# Patient Record
Sex: Male | Born: 1935 | Race: Black or African American | Hispanic: No | Marital: Married | Smoking: Former smoker
Health system: Southern US, Community
[De-identification: ages and names within clinical notes are randomized; demographics above are authoritative.]

## PROBLEM LIST (undated history)

## (undated) DIAGNOSIS — H9190 Unspecified hearing loss, unspecified ear: Secondary | ICD-10-CM

## (undated) DIAGNOSIS — F0390 Unspecified dementia without behavioral disturbance: Secondary | ICD-10-CM

## (undated) DIAGNOSIS — E876 Hypokalemia: Secondary | ICD-10-CM

## (undated) DIAGNOSIS — J45909 Unspecified asthma, uncomplicated: Secondary | ICD-10-CM

## (undated) DIAGNOSIS — R351 Nocturia: Secondary | ICD-10-CM

## (undated) DIAGNOSIS — M5410 Radiculopathy, site unspecified: Secondary | ICD-10-CM

## (undated) DIAGNOSIS — M25569 Pain in unspecified knee: Secondary | ICD-10-CM

## (undated) DIAGNOSIS — G8929 Other chronic pain: Secondary | ICD-10-CM

## (undated) DIAGNOSIS — M109 Gout, unspecified: Secondary | ICD-10-CM

## (undated) DIAGNOSIS — I10 Essential (primary) hypertension: Secondary | ICD-10-CM

## (undated) DIAGNOSIS — E7800 Pure hypercholesterolemia, unspecified: Secondary | ICD-10-CM

## (undated) DIAGNOSIS — N186 End stage renal disease: Secondary | ICD-10-CM

## (undated) DIAGNOSIS — N189 Chronic kidney disease, unspecified: Secondary | ICD-10-CM

## (undated) HISTORY — PX: ROTATOR CUFF REPAIR: SHX139

## (undated) HISTORY — DX: End stage renal disease: N18.6

## (undated) HISTORY — DX: Pure hypercholesterolemia, unspecified: E78.00

## (undated) HISTORY — PX: APPENDECTOMY: SHX54

## (undated) HISTORY — PX: OTHER SURGICAL HISTORY: SHX169

---

## 2001-02-03 ENCOUNTER — Encounter: Payer: Self-pay | Admitting: Family Medicine

## 2001-02-03 ENCOUNTER — Ambulatory Visit (HOSPITAL_COMMUNITY): Admission: RE | Admit: 2001-02-03 | Discharge: 2001-02-03 | Payer: Self-pay | Admitting: Family Medicine

## 2006-02-07 ENCOUNTER — Encounter: Admission: RE | Admit: 2006-02-07 | Discharge: 2006-02-07 | Payer: Self-pay | Admitting: Internal Medicine

## 2007-02-19 ENCOUNTER — Emergency Department (HOSPITAL_COMMUNITY): Admission: EM | Admit: 2007-02-19 | Discharge: 2007-02-19 | Payer: Self-pay | Admitting: Emergency Medicine

## 2007-12-15 ENCOUNTER — Ambulatory Visit: Admission: RE | Admit: 2007-12-15 | Discharge: 2007-12-15 | Payer: Self-pay | Admitting: Neurology

## 2008-02-08 ENCOUNTER — Emergency Department (HOSPITAL_COMMUNITY): Admission: EM | Admit: 2008-02-08 | Discharge: 2008-02-08 | Payer: Self-pay | Admitting: Emergency Medicine

## 2010-01-30 ENCOUNTER — Inpatient Hospital Stay (HOSPITAL_COMMUNITY)
Admission: EM | Admit: 2010-01-30 | Discharge: 2010-02-17 | DRG: 339 | Disposition: A | Discharge status: Home Health | Payer: Medicare Other | Attending: Surgery | Admitting: Surgery

## 2010-01-30 DIAGNOSIS — K929 Disease of digestive system, unspecified: Secondary | ICD-10-CM | POA: Diagnosis not present

## 2010-01-30 DIAGNOSIS — M109 Gout, unspecified: Secondary | ICD-10-CM | POA: Diagnosis present

## 2010-01-30 DIAGNOSIS — K35209 Acute appendicitis with generalized peritonitis, without abscess, unspecified as to perforation: Principal | ICD-10-CM | POA: Diagnosis present

## 2010-01-30 DIAGNOSIS — N400 Enlarged prostate without lower urinary tract symptoms: Secondary | ICD-10-CM | POA: Diagnosis present

## 2010-01-30 DIAGNOSIS — K560 Paralytic ileus: Secondary | ICD-10-CM | POA: Diagnosis not present

## 2010-01-30 DIAGNOSIS — Z880 Allergy status to penicillin: Secondary | ICD-10-CM

## 2010-01-30 DIAGNOSIS — Y9210 Unspecified residential institution as the place of occurrence of the external cause: Secondary | ICD-10-CM | POA: Diagnosis not present

## 2010-01-30 DIAGNOSIS — J449 Chronic obstructive pulmonary disease, unspecified: Secondary | ICD-10-CM | POA: Diagnosis present

## 2010-01-30 DIAGNOSIS — E119 Type 2 diabetes mellitus without complications: Secondary | ICD-10-CM | POA: Diagnosis present

## 2010-01-30 DIAGNOSIS — Z6832 Body mass index (BMI) 32.0-32.9, adult: Secondary | ICD-10-CM

## 2010-01-30 DIAGNOSIS — Y838 Other surgical procedures as the cause of abnormal reaction of the patient, or of later complication, without mention of misadventure at the time of the procedure: Secondary | ICD-10-CM | POA: Diagnosis not present

## 2010-01-30 DIAGNOSIS — J4489 Other specified chronic obstructive pulmonary disease: Secondary | ICD-10-CM | POA: Diagnosis present

## 2010-01-30 DIAGNOSIS — E669 Obesity, unspecified: Secondary | ICD-10-CM | POA: Diagnosis present

## 2010-01-30 DIAGNOSIS — I10 Essential (primary) hypertension: Secondary | ICD-10-CM | POA: Diagnosis present

## 2010-01-30 DIAGNOSIS — G4733 Obstructive sleep apnea (adult) (pediatric): Secondary | ICD-10-CM | POA: Diagnosis present

## 2010-01-30 DIAGNOSIS — Z7982 Long term (current) use of aspirin: Secondary | ICD-10-CM

## 2010-01-30 DIAGNOSIS — K219 Gastro-esophageal reflux disease without esophagitis: Secondary | ICD-10-CM | POA: Diagnosis present

## 2010-01-30 DIAGNOSIS — K352 Acute appendicitis with generalized peritonitis, without abscess: Principal | ICD-10-CM | POA: Diagnosis present

## 2010-01-30 DIAGNOSIS — E46 Unspecified protein-calorie malnutrition: Secondary | ICD-10-CM | POA: Diagnosis present

## 2010-01-30 DIAGNOSIS — M129 Arthropathy, unspecified: Secondary | ICD-10-CM | POA: Diagnosis present

## 2010-01-30 LAB — GLUCOSE, CAPILLARY
Glucose-Capillary: 132 mg/dL — ABNORMAL HIGH (ref 70–99)
Glucose-Capillary: 128 mg/dL — ABNORMAL HIGH (ref 70–99)

## 2010-01-31 LAB — DIFFERENTIAL
Basophils Relative: 0 % (ref 0–1)
Eosinophils Absolute: 0.1 10*3/uL (ref 0.0–0.7)
Monocytes Relative: 11 % (ref 3–12)
Neutrophils Relative %: 77 % (ref 43–77)

## 2010-01-31 LAB — GLUCOSE, CAPILLARY
Glucose-Capillary: 131 mg/dL — ABNORMAL HIGH (ref 70–99)
Glucose-Capillary: 123 mg/dL — ABNORMAL HIGH (ref 70–99)

## 2010-01-31 LAB — BASIC METABOLIC PANEL
Calcium: 8.3 mg/dL — ABNORMAL LOW (ref 8.4–10.5)
Chloride: 104 mEq/L (ref 96–112)
Creatinine, Ser: 1.68 mg/dL — ABNORMAL HIGH (ref 0.4–1.5)
GFR calc Af Amer: 48 mL/min — ABNORMAL LOW (ref >60)

## 2010-01-31 LAB — CBC
MCH: 28.5 pg (ref 26.0–34.0)
MCHC: 34.7 g/dL (ref 30.0–36.0)
Platelets: 209 10*3/uL (ref 150–400)
RBC: 3.69 MIL/uL — ABNORMAL LOW (ref 4.22–5.81)

## 2010-02-01 LAB — COMPREHENSIVE METABOLIC PANEL
AST: 11 U/L (ref 0–37)
Albumin: 2.5 g/dL — ABNORMAL LOW (ref 3.5–5.2)
Alkaline Phosphatase: 59 U/L (ref 39–117)
Chloride: 107 mEq/L (ref 96–112)
Creatinine, Ser: 1.6 mg/dL — ABNORMAL HIGH (ref 0.4–1.5)
GFR calc Af Amer: 51 mL/min — ABNORMAL LOW (ref >60)
Potassium: 3.2 mEq/L — ABNORMAL LOW (ref 3.5–5.1)
Total Bilirubin: 0.6 mg/dL (ref 0.3–1.2)
Total Protein: 5.1 g/dL — ABNORMAL LOW (ref 6.0–8.3)

## 2010-02-01 LAB — CBC
Platelets: 191 10*3/uL (ref 150–400)
RBC: 3.55 MIL/uL — ABNORMAL LOW (ref 4.22–5.81)
WBC: 10.6 10*3/uL — ABNORMAL HIGH (ref 4.0–10.5)

## 2010-02-01 LAB — GLUCOSE, CAPILLARY
Glucose-Capillary: 167 mg/dL — ABNORMAL HIGH (ref 70–99)
Glucose-Capillary: 95 mg/dL (ref 70–99)
Glucose-Capillary: 123 mg/dL — ABNORMAL HIGH (ref 70–99)

## 2010-02-01 LAB — DIFFERENTIAL
Basophils Relative: 0 % (ref 0–1)
Eosinophils Absolute: 0.2 10*3/uL (ref 0.0–0.7)
Neutrophils Relative %: 74 % (ref 43–77)

## 2010-02-01 NOTE — Consult Note (Signed)
NAME:  MILES, NEUPERT NO.:  [id number]  MEDICAL RECORD NO.:  [id number]          PATIENT TYPE:  INP  LOCATION:  5149                         FACILITY:  [facility]  PHYSICIAN:  Marcello Moores A. Aryah Doering, M.D.DATE OF BIRTH:  [birth year]  DATE OF CONSULTATION:  01/30/2010 DATE OF DISCHARGE:                                CONSULTATION   PHYSICIAN REQUESTING CONSULTATION:  Haywood Pao, MD  REASON FOR CONSULTATION:  Perforated appendicitis.  HISTORY OF PRESENT ILLNESS:  The patient is a pleasant 77 year old male with 1-week history of lower abdominal pain and anorexia.  Pain was diffuse about a week ago and then 2 or 3 days later became more in his right lower quadrant.  He has had right lower quadrant pain now for about 3 days.  It only hurts when he rolls on.  The pain is located in the right lower quadrant and is about a 5-8/10, made worse with compression.  He has loss of appetite, but has been eating and trying to eat at home with no nausea or vomiting.  Bowels are moving with no blood in his stool.  He was seen by [name] today.  CT scan was obtained which showed perforated appendicitis without abscess.  I was asked to see him at the request of [name].  According to the family, he is relatively comfortable.  PAST MEDICAL HISTORY:  Diabetes mellitus type 2, obesity, hypertension, sleep apnea, benign prostatic hypertrophy, COPD, gastroesophageal reflux disease.  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  ALLERGIES:  PENICILLIN, ACE INHIBITOR.  FAMILY HISTORY:  Diabetes mellitus.  SOCIAL HISTORY:  He is retired, worked at [employer].  Denies tobacco or alcohol use.  He is married.  REVIEW OF SYSTEMS:  As per chart, otherwise negative x15.  PHYSICAL EXAMINATION:  GENERAL APPEARANCE:  Pleasant male, in no apparent distress. HEENT:  Wears glasses, no jaundice. NECK:  Supple, nontender.   Trachea midline.  No obvious mass. PULMONARY:  Lung sounds are clear.  Chest wall motion is normal. CARDIOVASCULAR:  Regular rate and rhythm without rub, murmur, or gallop. EXTREMITIES:  Warm, well perfused. ABDOMEN:  Protuberant, tender right lower quadrant.  No diffuse peritonitis, rebound, or guarding noted in right lower quadrant.  No mass or phlegmon.  No hernia. EXTREMITIES:  Range of motion and muscle tone appear relatively normal. NEUROLOGIC:  Glasgow coma scale is 15.  Motor and sensory function are intact.  DIAGNOSTIC STUDIES:  Review of his abdomen and pelvic CT scan in [facility] which shows acute appendicitis with perforation without abscess.  IMPRESSION: 1. Perforated appendicitis. 2. Diabetes mellitus type 2. 3. Sleep apnea. 4. Benign prostatic hypertrophy. 5. Chronic obstructive sleep apnea.  PLAN:  Given the fact it is perforated and appears to be localized, recommend IV antibiotics.  He is ordered an Avelox, which is appropriate.  He will need a CT scan in 2-3 days for followup and may have a clear liquid diet and may ambulate.  We will follow along.  If condition worsens, he may require exploratory laparotomy.  I have discussed this with the patient and  wife.     Joyice Faster. Taniqua Issa, [name]     TAC/MEDQ  D:  01/30/2010  T:  01/31/2010  Job:  [id number]  Electronically Signed by Erroll Luna [name] on 02/01/2010 12:34:59 AM

## 2010-02-02 LAB — COMPREHENSIVE METABOLIC PANEL
ALT: 15 U/L (ref 0–53)
AST: 11 U/L (ref 0–37)
Albumin: 2.5 g/dL — ABNORMAL LOW (ref 3.5–5.2)
Alkaline Phosphatase: 63 U/L (ref 39–117)
Chloride: 108 mEq/L (ref 96–112)
GFR calc Af Amer: 53 mL/min — ABNORMAL LOW (ref >60)
Potassium: 3.6 mEq/L (ref 3.5–5.1)
Sodium: 142 mEq/L (ref 135–145)
Total Bilirubin: 0.8 mg/dL (ref 0.3–1.2)
Total Protein: 5.2 g/dL — ABNORMAL LOW (ref 6.0–8.3)

## 2010-02-02 LAB — DIFFERENTIAL
Basophils Absolute: 0 10*3/uL (ref 0.0–0.1)
Basophils Relative: 0 % (ref 0–1)
Eosinophils Absolute: 0.3 10*3/uL (ref 0.0–0.7)
Lymphs Abs: 1.3 10*3/uL (ref 0.7–4.0)
Neutrophils Relative %: 70 % (ref 43–77)

## 2010-02-02 LAB — CBC
MCV: 82.9 fL (ref 78.0–100.0)
Platelets: 184 10*3/uL (ref 150–400)
RBC: 3.51 MIL/uL — ABNORMAL LOW (ref 4.22–5.81)
RDW: 15 % (ref 11.5–15.5)
WBC: 8.8 10*3/uL (ref 4.0–10.5)

## 2010-02-03 LAB — COMPREHENSIVE METABOLIC PANEL
ALT: 14 U/L (ref 0–53)
AST: 12 U/L (ref 0–37)
CO2: 23 mEq/L (ref 19–32)
Calcium: 8.6 mg/dL (ref 8.4–10.5)
Chloride: 110 mEq/L (ref 96–112)
Creatinine, Ser: 1.69 mg/dL — ABNORMAL HIGH (ref 0.4–1.5)
GFR calc Af Amer: 48 mL/min — ABNORMAL LOW (ref >60)
GFR calc non Af Amer: 40 mL/min — ABNORMAL LOW (ref >60)
Glucose, Bld: 103 mg/dL — ABNORMAL HIGH (ref 70–99)
Sodium: 142 mEq/L (ref 135–145)
Total Bilirubin: 0.7 mg/dL (ref 0.3–1.2)

## 2010-02-03 LAB — CBC
HCT: 29.1 % — ABNORMAL LOW (ref 39.0–52.0)
Hemoglobin: 9.8 g/dL — ABNORMAL LOW (ref 13.0–17.0)
MCH: 27.8 pg (ref 26.0–34.0)
MCHC: 33.7 g/dL (ref 30.0–36.0)
RBC: 3.52 MIL/uL — ABNORMAL LOW (ref 4.22–5.81)

## 2010-02-03 LAB — DIFFERENTIAL
Basophils Relative: 0 % (ref 0–1)
Lymphocytes Relative: 16 % (ref 12–46)
Monocytes Absolute: 1.2 10*3/uL — ABNORMAL HIGH (ref 0.1–1.0)
Monocytes Relative: 14 % — ABNORMAL HIGH (ref 3–12)
Neutro Abs: 6.2 10*3/uL (ref 1.7–7.7)
Neutrophils Relative %: 68 % (ref 43–77)

## 2010-02-03 LAB — GLUCOSE, CAPILLARY
Glucose-Capillary: 106 mg/dL — ABNORMAL HIGH (ref 70–99)
Glucose-Capillary: 94 mg/dL (ref 70–99)
Glucose-Capillary: 144 mg/dL — ABNORMAL HIGH (ref 70–99)
Glucose-Capillary: 133 mg/dL — ABNORMAL HIGH (ref 70–99)

## 2010-02-04 ENCOUNTER — Inpatient Hospital Stay (HOSPITAL_COMMUNITY): Payer: Medicare Other

## 2010-02-04 ENCOUNTER — Other Ambulatory Visit: Payer: Self-pay | Admitting: Surgery

## 2010-02-04 LAB — DIFFERENTIAL
Basophils Absolute: 0 10*3/uL (ref 0.0–0.1)
Basophils Relative: 0 % (ref 0–1)
Monocytes Absolute: 1.8 10*3/uL — ABNORMAL HIGH (ref 0.1–1.0)
Neutro Abs: 7.7 10*3/uL (ref 1.7–7.7)
Neutrophils Relative %: 69 % (ref 43–77)

## 2010-02-04 LAB — CBC
Hemoglobin: 9.1 g/dL — ABNORMAL LOW (ref 13.0–17.0)
MCHC: 32.7 g/dL (ref 30.0–36.0)
RBC: 3.39 MIL/uL — ABNORMAL LOW (ref 4.22–5.81)

## 2010-02-04 LAB — GLUCOSE, CAPILLARY
Glucose-Capillary: 137 mg/dL — ABNORMAL HIGH (ref 70–99)
Glucose-Capillary: 153 mg/dL — ABNORMAL HIGH (ref 70–99)
Glucose-Capillary: 189 mg/dL — ABNORMAL HIGH (ref 70–99)

## 2010-02-04 LAB — COMPREHENSIVE METABOLIC PANEL
ALT: 16 U/L (ref 0–53)
AST: 14 U/L (ref 0–37)
CO2: 20 mEq/L (ref 19–32)
Calcium: 8.4 mg/dL (ref 8.4–10.5)
GFR calc Af Amer: 46 mL/min — ABNORMAL LOW (ref >60)
GFR calc non Af Amer: 38 mL/min — ABNORMAL LOW (ref >60)
Sodium: 140 mEq/L (ref 135–145)

## 2010-02-04 NOTE — H&P (Signed)
NAME:  Miguel Tucker, ROSSMILLER NO.:  [id number]  MEDICAL RECORD NO.:  [id number]          PATIENT TYPE:  INP  LOCATION:  [id number]                         FACILITY:  [facility]  PHYSICIAN:  Haywood Pao, M.D.DATE OF BIRTH:  1930/09/08  DATE OF ADMISSION:  01/30/2010 DATE OF DISCHARGE:                             HISTORY & PHYSICAL   CHIEF COMPLAINT:  Abdominal pain.  HISTORY OF PRESENT ILLNESS:  The patient is a 77 year old African American male who is seen today as a walk-in at 10 a.m.  He complains of right lower quadrant pain for 3 days.  He was able eat some oatmeal this morning but nothing else, has had some nausea over the past 3 days, but has not taken any over-the-counter pain medicines.  He said he had two bowel movements, which were somewhat loose this morning, states that the pain has been quite severe in the past 24 hours, but he chose not to go to the emergency room for it.  His wife is currently in the office with him here.  PAST MEDICAL HISTORY: 1. Diabetes mellitus type 2 with microalbumin 2010. 2. Hypertension. 3. BPH. 4. COPD/asthma, mild. 5. History of shingles on his right leg. 6. GERD with H. pylori, which has been treated in 2008. 7. Obstructive sleep apnea.  PAST SURGICAL HISTORY:  Rotator cuff, right 1998.  Noncancerous testicle tumor removed in 1958.    SOCIAL HISTORY:   The patient is married, has three sons.  His wife is currently with him.  He is retired.  FAMILY HISTORY:  Father died at age 35 of dementia.  Mother died at age 3 status post MI with a question of cancer.  He has a brother who died of a stroke at 40, a sister who died at age 34 of a brain tumor.  He has three sons one with diabetes mellitus on insulin, one who is healthy, another who is obese.  Of note, one of his sons had a ruptured appendix several years ago.  MEDICATIONS: 1. Norvasc 5 mg daily. 2. Terazosin 5 mg at bedtime. 3. Klor-Con 20 mEq once daily. 4.  Avodart 0.5 mg once daily. 5. Metformin 500 mg twice daily. 6. Aspirin 81 mg once daily. 7. Hyzaar 100/25 mg once daily. 8. Ventolin HFA 2 puffs q.6 h. as needed. 9. Singulair 10 mg once daily. 10.Nasonex once per each nostril once daily.  ALLERGIES:  ACE INHIBITORS and PENICILLIN.  REVIEW OF SYSTEMS:  The patient has not had any fevers, does have some chills, indicates that he has had nausea without vomiting.  He had some loose bowel movements today and abdominal pain in his right lower quadrant, which has become more severe with time.  Denies any urinary symptoms.  Denies any chest pain, shortness of breath, or dyspnea on exertion, and no new wheezing or asthma-type flares.  ADVANCED DIRECTIVES:  The patient is a full code.  PHYSICAL EXAMINATION:  VITAL SIGNS:  Blood pressure 134/72, pulse 68, his weight is 189 pounds.  He is 5 feet 4 inches. GENERAL:  No acute distress. HEENT:  Normocephalic, atraumatic.  PERRLA.  EOMI.  ENT is within normal limits.  NECK:  Supple.  No lymphadenopathy, JVD, or bruit. HEART:  Regular rate and rhythm.  No murmur, rub, or gallop. LUNGS:  Clear to auscultation bilaterally. ABDOMEN:  The patient has severe tenderness in his right lower quadrant pain with rebound and guarding. MUSCULOSKELETAL:  No joint deformities noted. NEURO:  The patient is oriented to person, place, and time. SKIN:  No new rashes.  LABORATORY DATA:  BUN and creatinine of 20 and 1.6 respectively with his BUN and creatinine being slightly elevated for him.  He has a white count of 12.1 with a granulocyte count of 9.7 with 80% granulocytes. Hemoglobin 12.5, platelets 261.  LFTs are within normal limits.  CT of abdomen and pelvis done with contrast at [facility] reveals a ruptured appendix with inflammation around the cecum.  ASSESSMENT AND PLAN: 1. Ruptured appendix.  I have contacted Dr. Brantley Stage for surgical     management.  He will see the patient once he  arrives at the     hospital and directly admitting him to the hospital.  We will cover     him with Avelox as he has a PENICILLIN allergy, and this will be     adequate for below the diaphragm coverage.  We will make him n.p.o.     and continue IV fluids. 2. Sliding scale for insulin as he will be off metformin. 3. He is not a high-risk surgical candidate and does not require     clearance beyond what has been done in the office already.     Haywood Pao, [name]     RWT/MEDQ  D:  01/30/2010  T:  01/31/2010  Job:  [id number]  cc:   Thomas A. Cornett, [name]  Electronically Signed by Domenick Gong [name] on 02/04/2010 08:33:08 AM

## 2010-02-05 LAB — CBC
MCH: 27.4 pg (ref 26.0–34.0)
MCHC: 34.1 g/dL (ref 30.0–36.0)
MCV: 80.2 fL (ref 78.0–100.0)
Platelets: 186 10*3/uL (ref 150–400)
RDW: 15.3 % (ref 11.5–15.5)

## 2010-02-05 LAB — DIFFERENTIAL
Eosinophils Absolute: 0 10*3/uL (ref 0.0–0.7)
Eosinophils Relative: 0 % (ref 0–5)
Lymphs Abs: 1.2 10*3/uL (ref 0.7–4.0)
Monocytes Absolute: 1.7 10*3/uL — ABNORMAL HIGH (ref 0.1–1.0)
Monocytes Relative: 14 % — ABNORMAL HIGH (ref 3–12)

## 2010-02-05 LAB — COMPREHENSIVE METABOLIC PANEL
Albumin: 2.1 g/dL — ABNORMAL LOW (ref 3.5–5.2)
BUN: 12 mg/dL (ref 6–23)
Creatinine, Ser: 1.66 mg/dL — ABNORMAL HIGH (ref 0.4–1.5)
Glucose, Bld: 230 mg/dL — ABNORMAL HIGH (ref 70–99)
Total Bilirubin: 0.7 mg/dL (ref 0.3–1.2)
Total Protein: 5.3 g/dL — ABNORMAL LOW (ref 6.0–8.3)

## 2010-02-05 LAB — GLUCOSE, CAPILLARY
Glucose-Capillary: 206 mg/dL — ABNORMAL HIGH (ref 70–99)
Glucose-Capillary: 222 mg/dL — ABNORMAL HIGH (ref 70–99)

## 2010-02-06 LAB — COMPREHENSIVE METABOLIC PANEL
Albumin: 1.9 g/dL — ABNORMAL LOW (ref 3.5–5.2)
BUN: 15 mg/dL (ref 6–23)
Calcium: 8 mg/dL — ABNORMAL LOW (ref 8.4–10.5)
Creatinine, Ser: 1.83 mg/dL — ABNORMAL HIGH (ref 0.4–1.5)
Potassium: 4.4 mEq/L (ref 3.5–5.1)
Total Protein: 5 g/dL — ABNORMAL LOW (ref 6.0–8.3)

## 2010-02-06 LAB — CBC
Hemoglobin: 9.5 g/dL — ABNORMAL LOW (ref 13.0–17.0)
MCHC: 33.2 g/dL (ref 30.0–36.0)
Platelets: 182 10*3/uL (ref 150–400)
RDW: 15.7 % — ABNORMAL HIGH (ref 11.5–15.5)

## 2010-02-06 LAB — GLUCOSE, CAPILLARY
Glucose-Capillary: 167 mg/dL — ABNORMAL HIGH (ref 70–99)
Glucose-Capillary: 144 mg/dL — ABNORMAL HIGH (ref 70–99)
Glucose-Capillary: 159 mg/dL — ABNORMAL HIGH (ref 70–99)

## 2010-02-06 LAB — DIFFERENTIAL
Basophils Absolute: 0 10*3/uL (ref 0.0–0.1)
Basophils Relative: 0 % (ref 0–1)
Monocytes Absolute: 1.4 10*3/uL — ABNORMAL HIGH (ref 0.1–1.0)
Neutro Abs: 8.9 10*3/uL — ABNORMAL HIGH (ref 1.7–7.7)

## 2010-02-06 LAB — PHOSPHORUS: Phosphorus: 3.4 mg/dL (ref 2.3–4.6)

## 2010-02-07 ENCOUNTER — Inpatient Hospital Stay (HOSPITAL_COMMUNITY): Payer: Medicare Other

## 2010-02-07 ENCOUNTER — Encounter (HOSPITAL_COMMUNITY): Payer: Self-pay | Admitting: Internal Medicine

## 2010-02-07 LAB — COMPREHENSIVE METABOLIC PANEL
Albumin: 1.9 g/dL — ABNORMAL LOW (ref 3.5–5.2)
BUN: 18 mg/dL (ref 6–23)
Chloride: 110 mEq/L (ref 96–112)
Creatinine, Ser: 1.84 mg/dL — ABNORMAL HIGH (ref 0.4–1.5)
Glucose, Bld: 175 mg/dL — ABNORMAL HIGH (ref 70–99)
Total Bilirubin: 1 mg/dL (ref 0.3–1.2)

## 2010-02-07 LAB — GLUCOSE, CAPILLARY
Glucose-Capillary: 159 mg/dL — ABNORMAL HIGH (ref 70–99)
Glucose-Capillary: 149 mg/dL — ABNORMAL HIGH (ref 70–99)
Glucose-Capillary: 157 mg/dL — ABNORMAL HIGH (ref 70–99)

## 2010-02-07 LAB — CBC
HCT: 28.5 % — ABNORMAL LOW (ref 39.0–52.0)
MCH: 27.4 pg (ref 26.0–34.0)
MCV: 82.1 fL (ref 78.0–100.0)
Platelets: 241 10*3/uL (ref 150–400)
RDW: 16 % — ABNORMAL HIGH (ref 11.5–15.5)

## 2010-02-07 LAB — URIC ACID: Uric Acid, Serum: 4.5 mg/dL (ref 4.0–7.8)

## 2010-02-07 LAB — DIFFERENTIAL
Eosinophils Absolute: 0.3 10*3/uL (ref 0.0–0.7)
Eosinophils Relative: 3 % (ref 0–5)
Lymphs Abs: 1.3 10*3/uL (ref 0.7–4.0)
Monocytes Relative: 12 % (ref 3–12)

## 2010-02-08 LAB — GLUCOSE, CAPILLARY
Glucose-Capillary: 163 mg/dL — ABNORMAL HIGH (ref 70–99)
Glucose-Capillary: 174 mg/dL — ABNORMAL HIGH (ref 70–99)
Glucose-Capillary: 161 mg/dL — ABNORMAL HIGH (ref 70–99)

## 2010-02-08 LAB — COMPREHENSIVE METABOLIC PANEL
ALT: 22 U/L (ref 0–53)
Alkaline Phosphatase: 67 U/L (ref 39–117)
Glucose, Bld: 187 mg/dL — ABNORMAL HIGH (ref 70–99)
Potassium: 4 mEq/L (ref 3.5–5.1)
Sodium: 142 mEq/L (ref 135–145)
Total Protein: 5.2 g/dL — ABNORMAL LOW (ref 6.0–8.3)

## 2010-02-08 LAB — DIFFERENTIAL
Basophils Relative: 0 % (ref 0–1)
Eosinophils Absolute: 0.4 10*3/uL (ref 0.0–0.7)
Eosinophils Relative: 3 % (ref 0–5)
Lymphs Abs: 1.8 10*3/uL (ref 0.7–4.0)
Monocytes Absolute: 1 10*3/uL (ref 0.1–1.0)
Monocytes Relative: 10 % (ref 3–12)
Neutrophils Relative %: 69 % (ref 43–77)

## 2010-02-08 LAB — CBC
HCT: 28.8 % — ABNORMAL LOW (ref 39.0–52.0)
Hemoglobin: 9.7 g/dL — ABNORMAL LOW (ref 13.0–17.0)
RDW: 16.1 % — ABNORMAL HIGH (ref 11.5–15.5)
WBC: 10.3 10*3/uL (ref 4.0–10.5)

## 2010-02-09 ENCOUNTER — Inpatient Hospital Stay (HOSPITAL_COMMUNITY): Payer: Medicare Other

## 2010-02-09 LAB — DIFFERENTIAL
Eosinophils Relative: 2 % (ref 0–5)
Lymphocytes Relative: 13 % (ref 12–46)
Lymphs Abs: 1.3 10*3/uL (ref 0.7–4.0)
Monocytes Absolute: 1 10*3/uL (ref 0.1–1.0)
Monocytes Relative: 10 % (ref 3–12)

## 2010-02-09 LAB — GLUCOSE, CAPILLARY
Glucose-Capillary: 169 mg/dL — ABNORMAL HIGH (ref 70–99)
Glucose-Capillary: 175 mg/dL — ABNORMAL HIGH (ref 70–99)

## 2010-02-09 LAB — CBC
HCT: 29.4 % — ABNORMAL LOW (ref 39.0–52.0)
MCH: 26.9 pg (ref 26.0–34.0)
MCHC: 33 g/dL (ref 30.0–36.0)
MCV: 81.4 fL (ref 78.0–100.0)
RDW: 16.1 % — ABNORMAL HIGH (ref 11.5–15.5)

## 2010-02-09 LAB — COMPREHENSIVE METABOLIC PANEL
BUN: 16 mg/dL (ref 6–23)
Calcium: 8.4 mg/dL (ref 8.4–10.5)
GFR calc non Af Amer: 35 mL/min — ABNORMAL LOW (ref >60)
Glucose, Bld: 174 mg/dL — ABNORMAL HIGH (ref 70–99)
Total Protein: 5.2 g/dL — ABNORMAL LOW (ref 6.0–8.3)

## 2010-02-09 LAB — MAGNESIUM: Magnesium: 1.9 mg/dL (ref 1.5–2.5)

## 2010-02-10 LAB — GLUCOSE, CAPILLARY
Glucose-Capillary: 164 mg/dL — ABNORMAL HIGH (ref 70–99)
Glucose-Capillary: 181 mg/dL — ABNORMAL HIGH (ref 70–99)
Glucose-Capillary: 151 mg/dL — ABNORMAL HIGH (ref 70–99)
Glucose-Capillary: 141 mg/dL — ABNORMAL HIGH (ref 70–99)

## 2010-02-10 LAB — CBC
HCT: 28.2 % — ABNORMAL LOW (ref 39.0–52.0)
Hemoglobin: 9.5 g/dL — ABNORMAL LOW (ref 13.0–17.0)
MCHC: 33.7 g/dL (ref 30.0–36.0)
RBC: 3.49 MIL/uL — ABNORMAL LOW (ref 4.22–5.81)

## 2010-02-10 LAB — BASIC METABOLIC PANEL
CO2: 27 mEq/L (ref 19–32)
Calcium: 8.2 mg/dL — ABNORMAL LOW (ref 8.4–10.5)
Chloride: 111 mEq/L (ref 96–112)
Glucose, Bld: 165 mg/dL — ABNORMAL HIGH (ref 70–99)
Sodium: 144 mEq/L (ref 135–145)

## 2010-02-11 ENCOUNTER — Inpatient Hospital Stay (HOSPITAL_COMMUNITY): Payer: Medicare Other

## 2010-02-11 LAB — CBC
HCT: 28.4 % — ABNORMAL LOW (ref 39.0–52.0)
MCHC: 33.5 g/dL (ref 30.0–36.0)
RDW: 16.2 % — ABNORMAL HIGH (ref 11.5–15.5)

## 2010-02-11 LAB — DIFFERENTIAL
Basophils Absolute: 0 10*3/uL (ref 0.0–0.1)
Basophils Relative: 0 % (ref 0–1)
Eosinophils Absolute: 0.4 10*3/uL (ref 0.0–0.7)
Eosinophils Relative: 3 % (ref 0–5)
Monocytes Absolute: 1.4 10*3/uL — ABNORMAL HIGH (ref 0.1–1.0)

## 2010-02-11 LAB — GLUCOSE, CAPILLARY
Glucose-Capillary: 139 mg/dL — ABNORMAL HIGH (ref 70–99)
Glucose-Capillary: 166 mg/dL — ABNORMAL HIGH (ref 70–99)

## 2010-02-11 LAB — BASIC METABOLIC PANEL
Calcium: 8.2 mg/dL — ABNORMAL LOW (ref 8.4–10.5)
GFR calc non Af Amer: 37 mL/min — ABNORMAL LOW (ref >60)
Glucose, Bld: 184 mg/dL — ABNORMAL HIGH (ref 70–99)
Sodium: 143 mEq/L (ref 135–145)

## 2010-02-11 LAB — CREATININE, URINE, RANDOM: Creatinine, Urine: 45.8 mg/dL

## 2010-02-11 MED ORDER — IOHEXOL 300 MG/ML  SOLN: 100.0000 mL | Freq: Once | INTRAMUSCULAR | Status: AC | PRN

## 2010-02-12 ENCOUNTER — Inpatient Hospital Stay (HOSPITAL_COMMUNITY): Payer: Medicare Other

## 2010-02-12 LAB — DIFFERENTIAL
Basophils Absolute: 0 10*3/uL (ref 0.0–0.1)
Basophils Relative: 0 % (ref 0–1)
Eosinophils Absolute: 0.3 10*3/uL (ref 0.0–0.7)
Eosinophils Relative: 3 % (ref 0–5)
Lymphocytes Relative: 10 % — ABNORMAL LOW (ref 12–46)

## 2010-02-12 LAB — BASIC METABOLIC PANEL
GFR calc Af Amer: 45 mL/min — ABNORMAL LOW (ref >60)
GFR calc non Af Amer: 37 mL/min — ABNORMAL LOW (ref >60)
Potassium: 3.6 mEq/L (ref 3.5–5.1)
Sodium: 139 mEq/L (ref 135–145)

## 2010-02-12 LAB — GLUCOSE, CAPILLARY
Glucose-Capillary: 137 mg/dL — ABNORMAL HIGH (ref 70–99)
Glucose-Capillary: 152 mg/dL — ABNORMAL HIGH (ref 70–99)
Glucose-Capillary: 154 mg/dL — ABNORMAL HIGH (ref 70–99)
Glucose-Capillary: 169 mg/dL — ABNORMAL HIGH (ref 70–99)
Glucose-Capillary: 176 mg/dL — ABNORMAL HIGH (ref 70–99)

## 2010-02-12 LAB — CBC
Platelets: 303 10*3/uL (ref 150–400)
RDW: 16.1 % — ABNORMAL HIGH (ref 11.5–15.5)
WBC: 11.2 10*3/uL — ABNORMAL HIGH (ref 4.0–10.5)

## 2010-02-13 ENCOUNTER — Inpatient Hospital Stay (HOSPITAL_COMMUNITY): Payer: Medicare Other

## 2010-02-13 LAB — PHOSPHORUS: Phosphorus: 3.7 mg/dL (ref 2.3–4.6)

## 2010-02-13 LAB — COMPREHENSIVE METABOLIC PANEL
Alkaline Phosphatase: 77 U/L (ref 39–117)
BUN: 10 mg/dL (ref 6–23)
Calcium: 8.1 mg/dL — ABNORMAL LOW (ref 8.4–10.5)
Creatinine, Ser: 1.7 mg/dL — ABNORMAL HIGH (ref 0.4–1.5)
Glucose, Bld: 206 mg/dL — ABNORMAL HIGH (ref 70–99)
Potassium: 3.6 mEq/L (ref 3.5–5.1)
Total Protein: 4.5 g/dL — ABNORMAL LOW (ref 6.0–8.3)

## 2010-02-13 LAB — DIFFERENTIAL
Basophils Absolute: 0 10*3/uL (ref 0.0–0.1)
Basophils Relative: 0 % (ref 0–1)
Eosinophils Relative: 2 % (ref 0–5)
Monocytes Absolute: 1.1 10*3/uL — ABNORMAL HIGH (ref 0.1–1.0)
Monocytes Relative: 10 % (ref 3–12)

## 2010-02-13 LAB — CBC
HCT: 25.1 % — ABNORMAL LOW (ref 39.0–52.0)
MCH: 26.8 pg (ref 26.0–34.0)
MCHC: 33.5 g/dL (ref 30.0–36.0)
RDW: 16.3 % — ABNORMAL HIGH (ref 11.5–15.5)

## 2010-02-13 LAB — GLUCOSE, CAPILLARY: Glucose-Capillary: 193 mg/dL — ABNORMAL HIGH (ref 70–99)

## 2010-02-13 LAB — CHOLESTEROL, TOTAL: Cholesterol: 75 mg/dL (ref 0–200)

## 2010-02-13 LAB — MAGNESIUM: Magnesium: 1.7 mg/dL (ref 1.5–2.5)

## 2010-02-14 LAB — COMPREHENSIVE METABOLIC PANEL
ALT: 33 U/L (ref 0–53)
Albumin: 2.1 g/dL — ABNORMAL LOW (ref 3.5–5.2)
Alkaline Phosphatase: 83 U/L (ref 39–117)
Glucose, Bld: 184 mg/dL — ABNORMAL HIGH (ref 70–99)
Potassium: 4.2 mEq/L (ref 3.5–5.1)
Sodium: 136 mEq/L (ref 135–145)
Total Protein: 5 g/dL — ABNORMAL LOW (ref 6.0–8.3)

## 2010-02-14 LAB — CBC
HCT: 26 % — ABNORMAL LOW (ref 39.0–52.0)
Platelets: 344 10*3/uL (ref 150–400)
RBC: 3.24 MIL/uL — ABNORMAL LOW (ref 4.22–5.81)
RDW: 16 % — ABNORMAL HIGH (ref 11.5–15.5)
WBC: 14.2 10*3/uL — ABNORMAL HIGH (ref 4.0–10.5)

## 2010-02-14 LAB — GLUCOSE, CAPILLARY: Glucose-Capillary: 193 mg/dL — ABNORMAL HIGH (ref 70–99)

## 2010-02-15 LAB — GLUCOSE, CAPILLARY
Glucose-Capillary: 161 mg/dL — ABNORMAL HIGH (ref 70–99)
Glucose-Capillary: 152 mg/dL — ABNORMAL HIGH (ref 70–99)
Glucose-Capillary: 167 mg/dL — ABNORMAL HIGH (ref 70–99)

## 2010-02-15 LAB — BASIC METABOLIC PANEL
BUN: 13 mg/dL (ref 6–23)
Chloride: 106 mEq/L (ref 96–112)
Glucose, Bld: 132 mg/dL — ABNORMAL HIGH (ref 70–99)
Potassium: 4.4 mEq/L (ref 3.5–5.1)

## 2010-02-15 LAB — CBC
MCHC: 33.9 g/dL (ref 30.0–36.0)
RDW: 16.5 % — ABNORMAL HIGH (ref 11.5–15.5)

## 2010-02-16 LAB — COMPREHENSIVE METABOLIC PANEL
ALT: 45 U/L (ref 0–53)
Calcium: 8.7 mg/dL (ref 8.4–10.5)
GFR calc Af Amer: 47 mL/min — ABNORMAL LOW (ref >60)
Glucose, Bld: 133 mg/dL — ABNORMAL HIGH (ref 70–99)
Sodium: 138 mEq/L (ref 135–145)
Total Protein: 4.9 g/dL — ABNORMAL LOW (ref 6.0–8.3)

## 2010-02-16 LAB — GLUCOSE, CAPILLARY
Glucose-Capillary: 198 mg/dL — ABNORMAL HIGH (ref 70–99)
Glucose-Capillary: 149 mg/dL — ABNORMAL HIGH (ref 70–99)

## 2010-02-16 LAB — DIFFERENTIAL
Basophils Relative: 1 % (ref 0–1)
Lymphocytes Relative: 14 % (ref 12–46)
Monocytes Relative: 12 % (ref 3–12)
Neutro Abs: 8.5 10*3/uL — ABNORMAL HIGH (ref 1.7–7.7)

## 2010-02-16 LAB — PREALBUMIN: Prealbumin: 11.2 mg/dL — ABNORMAL LOW (ref 17.0–34.0)

## 2010-02-16 LAB — CBC
HCT: 24.8 % — ABNORMAL LOW (ref 39.0–52.0)
Hemoglobin: 8.4 g/dL — ABNORMAL LOW (ref 13.0–17.0)
RBC: 3.08 MIL/uL — ABNORMAL LOW (ref 4.22–5.81)
RDW: 16.5 % — ABNORMAL HIGH (ref 11.5–15.5)
WBC: 12.2 10*3/uL — ABNORMAL HIGH (ref 4.0–10.5)

## 2010-02-16 LAB — MAGNESIUM: Magnesium: 1.9 mg/dL (ref 1.5–2.5)

## 2010-02-26 NOTE — Op Note (Signed)
NAME:  Miguel Tucker, Miguel Tucker NO.:  [id number]  MEDICAL RECORD NO.:  [id number]           PATIENT TYPE:  I  LOCATION:  [id number]                         FACILITY:  [facility]  PHYSICIAN:  [name], MD      DATE OF BIRTH:  08-14-1930  DATE OF PROCEDURE:  02/04/2010 DATE OF DISCHARGE:                              OPERATIVE REPORT   PREOPERATIVE DIAGNOSIS:  Perforated appendicitis.  POSTOPERATIVE DIAGNOSIS:  Perforated appendicitis.  PROCEDURES:  Exploratory laparotomy with appendectomy and drainage of right lower quadrant fluid collection.  SURGEON:  [name], MD  ASSISTANT:  Lydia Guiles, PA  ANESTHESIA:  General per Lorrene Reid, MD  ESTIMATED BLOOD LOSS:  Minimal.  PREPARATION:  ChloraPrep.  COMPLICATIONS:  None.  INDICATIONS:  The patient is a 77 year old black male admitted on the medical service with perforated appendicitis.  The patient was initially treated with bowel rest and intravenous antibiotics.  However, he had persistent right flank pain and right lower extremity pain as well as mild abdominal tenderness which did not resolve.  The patient  underwent followup CT scan which showed worsening inflammatory changes.  He was therefore prepared and brought to the operating room for appendectomy.  BODY OF REPORT:  Procedure was done in OR #17 at [facility].  The patient was brought to the operating room, placed in supine position on the operating room table.  Following administration of general anesthesia, the patient was positioned and then prepped and draped in the usual strict aseptic fashion.  After ascertaining that an adequate level of anesthesia been achieved, a lower midline abdominal incision was made with a #10 blade.  Dissection was carried through subcutaneous tissues.  Fascia was incised in the midline and the peritoneal cavity was entered cautiously.  Abdomen is explored.  Balfour retractor was placed for  exposure.  Palpation shows a dense inflammatory mass at the base of the cecum at the right pelvic brim.  Using gentle blunt dissection, loops of small bowel are mobilized off of the inflammatory process.  Serosanguineous fluid was noted and evacuated. There was no frankly purulent fluid identified.  Adhesions to the retroperitoneum were lysed sharply.  Ascending colon was mobilized along the lateral white line.  An inflammatory mass at the base of the cecum was then dissected away from the cecal wall carefully with sharp dissection.  This appeared to be an inflamed appendix.  It appears to have perforated near the tip.  Mesoappendix was divided with the electrocautery and suture ligated with 2-0 silk suture ligatures. Dissection was carried down to the base of the appendix at its junction with the cecal wall.  At this point a TA-30 stapler was utilized to occlude the base of the appendix and the appendix was sharply excised. It was submitted to Pathology for review.  Area was irrigated with warm saline.  Good hemostasis was noted.  Small bowel was irrigated.  Few small bowel adhesions were lysed sharply. Abdomen was irrigated with warm saline which was evacuated.  A 19-French Blake drain was placed in the right lower quadrant and pelvis.  It was brought out through a  stab wound in the right lower quadrant of the abdominal wall and secured to the skin with 3-0 nylon suture.  It was placed to bulb suction.  Small bowel was returned to the peritoneal cavity.  It was covered with the omentum.  Midline incision was closed with interrupted #1 Novafil simple sutures.  Subcutaneous tissues were irrigated.  Skin was closed with stainless steel staples.  Wound was washed and dried and sterile dressings were applied.  The patient was awakened from anesthesia and brought to the recovery room.  The patient tolerated the procedure well.     [name], MD     TMG/MEDQ  D:  02/04/2010  T:   02/05/2010  Job:  [id number]  cc:   Haywood Pao, [name]  Electronically Signed by Armandina Gemma MD on 02/26/2010 10:34:37 AM

## 2010-03-16 NOTE — Discharge Summary (Signed)
NAME:  Miguel Tucker, SEIGLE NO.:  [id number]  MEDICAL RECORD NO.:  [id number]           PATIENT TYPE:  I  LOCATION:  [id number]                         FACILITY:  [facility]  PHYSICIAN:  Adin Hector, MD     DATE OF BIRTH:  31-Jul-1930  DATE OF ADMISSION:  01/30/2010 DATE OF DISCHARGE:  02/17/2010                              DISCHARGE SUMMARY   CONSULTANTS:  Marcello Moores A. Cornett, MD  PROCEDURE:  Exploratory laparotomy with appendectomy and drainage of right lower quadrant fluid collection by Dr. Harlow Asa on February 04, 2010.  REASON FOR ADMISSION:  Mr. Miguel Tucker is a 77 year old black male who was seen earlier today at Dr. Loren Racer walk-in clinic at 10:00 a.m.  He complained of right lower quadrant abdominal pain for the last 3 days. He was having some nausea but has taken no p.r.n.  Over the last 24 hours, his pain has become more severe.  A CT scan of abdomen and pelvis was completed which revealed a ruptured appendix with inflammation around the cecum.  Because of this, the patient was admitted to the hospital and we were asked to evaluate the patient for further surgical management.  Please see admitting history and physical for further details.  ADMITTING DIAGNOSES: 1. Ruptured appendix. 2. Diabetes mellitus. 3. Hypertension. 4. History of chronic obstructive pulmonary disease. 5. Benign prostatic hypertrophy. 6. Obstructive sleep apnea.  HOSPITAL COURSE:  At this time, the patient was admitted.  He was placed on IV fluids as well as IV antibiotics.  Secondary to perforation, it was felt that maybe conservative management and eventually possibly a percutaneous drain with an interval appendectomy would be sufficient. However, repeat CT scan approximately 4 days after admission revealed continued inflammation with a possible abscess, but nothing that was drainable.  Therefore, at this time, it was felt that the patient was not improving and needed further surgical  management.    At this time, the patient was taken to the operating room on February 1 for exploratory laparotomy with appendectomy and drainage of purulent fluid in the abdomen.  The patient tolerated the procedure well.  His postoperative course was fairly unremarkable except where he had a prolonged postoperative ileus.  This lasted up until postoperative day 10, at which time, he began passing some flatus and had a bowel movement.  A follow up CT scan was obtained on postoperative day #7 as the patient's white blood cell count had slightly elevated.  However, he was not continuing to make improvement as far as passing flatus, having bowel sounds, or anything to indicate his belly was waking up from an ileus. At this time, he was also high risk for postoperative abscess.  The CT scan showed no evidence of an abscess.  It showed continued dilatation of some of his small bowel.  Therefore, his NG tube was continued until postoperative day 10, at which time, he had a bowel movement. Due to prolonged  n.p.o. status, the patient did have a PICC line as well as TNA in place.  This was discontinued on postoperative day 12.   His NG tube was left out and he was kept n.p.o.  On postoperative day 11, he was given clear liquids and his diet was advanced as tolerated.  By postoperative day 13, the patient was tolerating regular diet.  His abdomen was soft, nontender, nondistended with active bowel sounds.  His JP drain has been left in place as it has never really turned to a serous output.  It has remained a cloudy tan purulent-looking output despite a CT scan showing no abscess.  Therefore, the patient will be discharged home with his JP drain.  Over the past couple of days, postoperative day 10 and 11, the patient's wound at the inferior portion was slightly opened secondary to some erythema as well as an increase in white count.  It was found the patient had old hematoma and this  was evacuated.  He began normal saline wet-to-dry dressing changes twice a day.  This otherwise was clean at the time of discharge.  Otherwise, the patient remained stable from all other medical problems throughout his hospitalization.  He was felt stable for discharge home on postoperative day 13.  He does have home health in place for additional care for his dressing changes.  DISCHARGE DIAGNOSES: 1. Status post exploratory laparotomy with appendectomy and drainage     of purulent fluid. 2. Prolonged postoperative ileus, resolved. 3. Protein-calorie malnutrition, resolved. 4. Diabetes mellitus. 5. Hypertension. 6. Benign prostatic hypertrophy. 7. Chronic obstructive pulmonary disease. 8. Obstructive sleep apnea.  DISCHARGE MEDICATIONS:  Please see medication reconciliation form.  DISCHARGE INSTRUCTIONS:  The patient may increase his activity slowly and walk up steps.  He may shower, however, he is not to bathe.  He is not to lift anything greater than 15-20 pounds for the next 6 weeks.  He is not to do any driving for the next 1-2 weeks.    He is to resume a normal diabetic diet.    He is to empty his JP drain and record his output daily.  He is to bring that recording with him to our office for followup appointment.  He is to pack his wound twice a day with normal saline wet-to-dry dressing changes for which he has home health arranged.  Prior to discharge, all of his staples except for the 3 staples preceding his wound will be discontinued.  Steri-Strips will be placed.  Dr. Johney Maine has recommended leaving 3 staples above his wound in place to maintain a better hold so that his wound does not continue to open.  The patient will see Dr. Harlow Asa in the office next week on February 24, 2010, at 4:30 for followup for possible drain removal as well as the remaining staple removal.  He is otherwise to call our office for fever greater than 101.5 or worsening pain.     [name], PA   ______________________________ Adin Hector, MD    KEO/MEDQ  D:  02/17/2010  T:  02/18/2010  Job:  [id number]  cc:   Haywood Pao, [name] [name], MD  Electronically Signed by Saverio Danker PA on 02/27/2010 07:05:40 AM Electronically Signed by Michael Boston MD on 03/16/2010 11:56:13 AM

## 2010-04-21 LAB — CBC
HCT: 42.7 % (ref 39.0–52.0)
MCHC: 33 g/dL (ref 30.0–36.0)
MCV: 88.9 fL (ref 78.0–100.0)
Platelets: 194 10*3/uL (ref 150–400)
RDW: 15.6 % — ABNORMAL HIGH (ref 11.5–15.5)

## 2010-04-21 LAB — DIFFERENTIAL
Basophils Absolute: 0 10*3/uL (ref 0.0–0.1)
Basophils Relative: 0 % (ref 0–1)
Eosinophils Absolute: 0.1 10*3/uL (ref 0.0–0.7)
Eosinophils Relative: 1 % (ref 0–5)

## 2010-04-21 LAB — URINALYSIS, ROUTINE W REFLEX MICROSCOPIC
Ketones, ur: NEGATIVE mg/dL
Leukocytes, UA: NEGATIVE
Nitrite: NEGATIVE
Protein, ur: 30 mg/dL — AB

## 2010-04-21 LAB — BASIC METABOLIC PANEL
BUN: 18 mg/dL (ref 6–23)
CO2: 25 mEq/L (ref 19–32)
Chloride: 103 mEq/L (ref 96–112)
GFR calc non Af Amer: 47 mL/min — ABNORMAL LOW (ref >60)
Glucose, Bld: 171 mg/dL — ABNORMAL HIGH (ref 70–99)
Potassium: 3 mEq/L — ABNORMAL LOW (ref 3.5–5.1)

## 2010-05-22 NOTE — Procedures (Signed)
NAME:  Miguel Tucker, Miguel Tucker          ACCOUNT NO.:  [id number]   MEDICAL RECORD NO.:  [id number]          PATIENT TYPE:  OUT   LOCATION:  SLEE                          FACILITY:  [facility]   PHYSICIAN:  Kofi A. Merlene Laughter, [name] DATE OF BIRTH:  October 27, 1930   DATE OF PROCEDURE:  DATE OF DISCHARGE:  12/15/2007                             SLEEP DISORDER REPORT   REFERRING PHYSICIAN:  Kofi A. Doonquah, MD   INDICATIONS:  A 77 year old man who presents with snoring, fatigue, and  is being evaluated for obstructive sleep apnea syndrome.   MEDICATIONS:  1. Hyzaar.  2. Metformin.  3. Potassium.  4. Avodart.   Epworth sleepiness scale 2.  BMI 30.   ARCHITECTURAL SUMMARY:  This is a full diagnostic recording conducted  for 458 minutes.  Sleep efficiency 85%.  Sleep latency 50 minutes.  REM  latency 43 minutes.  Stage N1 13%, N2 55%, N3 6%, and REM sleep 26%.   RESPIRATORY SUMMARY:  The baseline oxygen saturation 97% with lowest  saturation 85% during REM sleep.  AHI is 9 with all events occurring  almost exclusively during REM sleep.  The REM API is 30.   LIMB MOVEMENT SUMMARY:  PLM index 0.   ELECTROCARDIOGRAM SUMMARY:  Average heart rate 61 with isolated PVCs  observed.   IMPRESSION:  Mild rapid eye movement-related obstructive sleep apnea  syndrome.      Kofi A. Merlene Laughter, [name]  Electronically Signed     KAD/MEDQ  D:  12/22/2007  T:  12/23/2007  Job:  [id number]

## 2010-07-21 ENCOUNTER — Ambulatory Visit: Payer: Medicare Other | Attending: Neurology | Admitting: Sleep Medicine

## 2010-07-21 DIAGNOSIS — Z6830 Body mass index (BMI) 30.0-30.9, adult: Secondary | ICD-10-CM | POA: Insufficient documentation

## 2010-07-21 DIAGNOSIS — G4733 Obstructive sleep apnea (adult) (pediatric): Secondary | ICD-10-CM

## 2010-07-22 ENCOUNTER — Encounter: Payer: Self-pay | Admitting: Sleep Medicine

## 2010-07-30 NOTE — Procedures (Signed)
NAME:  Miguel Tucker, Miguel Tucker          ACCOUNT NO.:  [id number]  MEDICAL RECORD NO.:  [id number]          PATIENT TYPE:  OUT  LOCATION:  SLEEP LAB                     FACILITY:  [facility]  PHYSICIAN:  Hallee Mckenny A. Merlene Laughter, [name] DATE OF BIRTH:  30-Sep-1930  DATE OF STUDY:  07/21/2010                           NOCTURNAL POLYSOMNOGRAM  REFERRING PHYSICIAN:  Jamaine Quintin  INDICATION FOR STUDY:  A 77 year old who presents with hypersomnia, fatigue, and snoring.  This has been done to evaluate for obstructive sleep apnea syndrome.  EPWORTH SLEEPINESS SCORE:  15.  BMI:  30.  MEDICATIONS:  Metformin, Avodart, amlodipine, terazosin, Hyzaar, Singulair, potassium.  SLEEP ARCHITECTURE:  The total recording time is 439 minutes.  Sleep efficiency 73%.  Sleep latency 7 minutes.  REM latency 45 minutes. Stage N1 of 11%, N2 of 66%, N3 of 0%, and REM sleep 22%.  RESPIRATORY DATA:  Baseline oxygen saturation 97%, lowest saturation 85% during REM sleep.  Diagnostic AHI 7.3 and RDI 9.2, however, most of the event occurred almost exclusive during REM sleep.  The REM AHI is 30.  CARDIAC DATA:  Average heart rate is 63 with infrequent PVCs and PACs observed.  MOVEMENT-PARASOMNIA:  PLM index 0.  IMPRESSIONS-RECOMMENDATIONS:  Mild obstructive sleep apnea syndrome.     [name] A. Merlene Laughter, [name]    KAD/MEDQ  D:  07/30/2010 09:00:12  T:  07/30/2010 [id number]  Job:  [id number]

## 2010-09-25 LAB — DIFFERENTIAL
Basophils Absolute: 0
Basophils Relative: 0
Eosinophils Absolute: 0.6
Eosinophils Relative: 7 — ABNORMAL HIGH
Monocytes Absolute: 0.6
Monocytes Relative: 7
Neutro Abs: 6.2

## 2010-09-25 LAB — BASIC METABOLIC PANEL
Chloride: 109
Creatinine, Ser: 1.42
GFR calc Af Amer: 59 — ABNORMAL LOW
GFR calc non Af Amer: 49 — ABNORMAL LOW
Potassium: 3.2 — ABNORMAL LOW

## 2010-09-25 LAB — CK TOTAL AND CKMB (NOT AT ARMC)
Relative Index: 1.8
Total CK: 274 — ABNORMAL HIGH

## 2010-09-25 LAB — CBC
Hemoglobin: 13
MCHC: 34.1
MCV: 88.6
RBC: 4.3
RDW: 15.8 — ABNORMAL HIGH

## 2010-09-25 LAB — TROPONIN I: Troponin I: 0.03

## 2010-10-30 ENCOUNTER — Telehealth (INDEPENDENT_AMBULATORY_CARE_PROVIDER_SITE_OTHER): Payer: Self-pay

## 2010-10-30 ENCOUNTER — Encounter (INDEPENDENT_AMBULATORY_CARE_PROVIDER_SITE_OTHER): Payer: Medicare Other

## 2010-10-30 ENCOUNTER — Emergency Department (HOSPITAL_COMMUNITY): Payer: Medicare Other

## 2010-10-30 ENCOUNTER — Encounter (HOSPITAL_COMMUNITY): Payer: Self-pay | Admitting: *Deleted

## 2010-10-30 ENCOUNTER — Emergency Department (HOSPITAL_COMMUNITY)
Admission: EM | Admit: 2010-10-30 | Discharge: 2010-10-30 | Disposition: A | Discharge status: Home / Self Care | Payer: Medicare Other | Attending: Emergency Medicine | Admitting: Emergency Medicine

## 2010-10-30 DIAGNOSIS — Z87891 Personal history of nicotine dependence: Secondary | ICD-10-CM | POA: Insufficient documentation

## 2010-10-30 DIAGNOSIS — M62838 Other muscle spasm: Secondary | ICD-10-CM | POA: Insufficient documentation

## 2010-10-30 DIAGNOSIS — I10 Essential (primary) hypertension: Secondary | ICD-10-CM | POA: Insufficient documentation

## 2010-10-30 DIAGNOSIS — IMO0002 Reserved for concepts with insufficient information to code with codable children: Secondary | ICD-10-CM

## 2010-10-30 DIAGNOSIS — M5030 Other cervical disc degeneration, unspecified cervical region: Secondary | ICD-10-CM | POA: Insufficient documentation

## 2010-10-30 DIAGNOSIS — E119 Type 2 diabetes mellitus without complications: Secondary | ICD-10-CM | POA: Insufficient documentation

## 2010-10-30 HISTORY — DX: Hypokalemia: E87.6

## 2010-10-30 HISTORY — DX: Essential (primary) hypertension: I10

## 2010-10-30 MED ORDER — METHOCARBAMOL 500 MG PO TABS: 1000.0000 mg | ORAL_TABLET | Freq: Four times a day (QID) | ORAL | Status: AC | PRN

## 2010-10-30 NOTE — Telephone Encounter (Signed)
You can move his appt to see Dr Harlow Asa for several weeks from now- doesn't need to see him next week

## 2010-10-30 NOTE — ED Provider Notes (Signed)
Scribed for Miguel Feller, DO, the patient was seen in room APA08/APA08 . This chart was scribed by Glory Buff.   CSN: [id number] Arrival date & time: 10/30/2010 10:10 AM   First MD Initiated Contact with Patient 10/30/10 1123      Chief Complaint  Patient presents with  . Neck Pain     The history is provided by the patient. No language interpreter was used.   Pt seen at 11:38.   Miguel Tucker is a 77 y.o. male who presents to the Emergency Department complaining of gradual onset and persistence of constant neck "pain" x 4 days. Reports pain is aggravated by palpation of the area and movement of his head from side to side. States he woke up with symptoms.  Denies any recent injury or trauma.  Pt says he went to walk-in-clinic earlier in the week and was told he has arthritis, rx daypro without change.  Denies any worsening of symptoms, no visual changes, no focal motor weakness, no tingling/numbness in extremities, no fevers, no rash, no CP/SOB.   Past Medical History  Diagnosis Date  . Hypertension   . Diabetes mellitus   . Hypokalemia     Past Surgical History  Procedure Date  . Appendectomy   . Rotator cuff repair     History  Substance Use Topics  . Smoking status: Former Research scientist (life sciences)  . Smokeless tobacco: Not on file  . Alcohol Use: No    Review of Systems ROS: Statement: All systems negative except as marked or noted in the HPI; Constitutional: Negative for fever and chills. ; ; Eyes: Negative for eye pain, redness and discharge. ; ; ENMT: Negative for ear pain, hoarseness, nasal congestion, sinus pressure and sore throat. ; ; Cardiovascular: Negative for chest pain, palpitations, diaphoresis, dyspnea and peripheral edema. ; ; Respiratory: Negative for cough, wheezing and stridor. ; ; Gastrointestinal: Negative for nausea, vomiting, diarrhea and abdominal pain, blood in stool, hematemesis, jaundice and rectal bleeding. . ; ; Genitourinary: Negative for dysuria,  flank pain and hematuria. ; ; Musculoskeletal: Negative for back pain and +neck pain. Negative for swelling and trauma.; ; Skin: Negative for pruritus, rash, abrasions, blisters, bruising and skin lesion.; ; Neuro: Negative for headache, lightheadedness and neck stiffness. Negative for weakness, altered level of consciousness , altered mental status, extremity weakness, paresthesias, involuntary movement, seizure and syncope.     Allergies  Penicillins  Home Medications   Current Outpatient Rx  Name Route Sig Dispense Refill  . AMLODIPINE BESYLATE 5 MG PO TABS Oral Take 5 mg by mouth daily.      . DUTASTERIDE 0.5 MG PO CAPS Oral Take 0.5 mg by mouth daily.      [name] LOSARTAN POTASSIUM-HCTZ 100-25 MG PO TABS Oral Take 1 tablet by mouth daily.      [name] METFORMIN HCL 500 MG PO TABS Oral Take 500 mg by mouth 2 (two) times daily with a meal.      . MONTELUKAST SODIUM 10 MG PO TABS Oral Take 10 mg by mouth at bedtime.      [name] POTASSIUM CHLORIDE CRYS CR 20 MEQ PO TBCR Oral Take 20 mEq by mouth daily.      [name] TERAZOSIN HCL 5 MG PO CAPS Oral Take 0.5 mg by mouth at bedtime.        BP 143/76  Pulse 80  Temp(Src) 98.9 F (37.2 C) (Oral)  Resp 16  Ht 5\' 5"  (1.651 m)  Wt 180 lb (81.647 kg)  BMI 29.95 kg/m2  Physical Exam 1140: Physical examination:  Nursing notes reviewed; Vital signs and O2 SAT reviewed;  Constitutional: Well developed, Well nourished, Well hydrated, In no acute distress; Head:  Normocephalic, atraumatic; Eyes: EOMI, PERRL, No scleral icterus; ENMT: Mouth and pharynx normal, Mucous membranes moist; Neck: Supple, Full range of motion, No lymphadenopathy; Cardiovascular: Regular rate and rhythm, No murmur, rub, or gallop; Respiratory: Breath sounds clear & equal bilaterally, No rales, rhonchi, wheezes, or rub, Normal respiratory effort/excursion; Chest: Nontender, Movement normal; Spine:  No midline CS, TS, LS tenderness. +TTP bilat hypertonic paracervical muscles.  Extremities: Pulses  normal, No tenderness, No edema, No calf edema or asymmetry.; Neuro: AA&Ox3, Major CN grossly intact. No facial droop, speech clear.  Gait steady.  No gross focal motor or sensory deficits in extremities.; Skin: Color normal, Warm, Dry, no rash, no petechiae.    ED Course  Procedures   MDM  MDM Reviewed: nursing note and vitals Interpretation: CT scan   Ct Cervical Spine Wo Contrast  10/30/2010  *RADIOLOGY REPORT*  Clinical Data: Neck pain without known injury.  CT CERVICAL SPINE WITHOUT CONTRAST  Technique:  Multidetector CT imaging of the cervical spine was performed. Multiplanar CT image reconstructions were also generated.  Comparison: None.  Findings: There is no fracture or subluxation of the cervical spine.  Multilevel loss of disc space height and endplate spurring is identified.  The patient also has multilevel facet degenerative disease.  Scattered areas of ligamentum flavum calcification are noted.  Lung apices are clear.  IMPRESSION: No acute finding.  Multilevel spondylosis.  Original Report Authenticated By: Arvid Right. D'ALESSIO, [name]     1:49 PM:  Pt wants to go home now.  Dx testing d/w pt and family.  Questions answered.  Verb understanding, agreeable to d/c home with outpt f/u.   [facility]  I personally performed the services described in this documentation, which was scribed in my presence. The recorded information has been reviewed and considered.         Miguel Feller, DO 10/30/10 [phone]

## 2010-10-30 NOTE — ED Notes (Addendum)
C/o neck pain at base of skull-states awoke with this pain Monday, and has been gradually worsening since; states pain is worse with movement; was seen yesterday by PCP and dx with arthritis-Rx Daypro; denies pain relief with use of Rx meds. Alert, oriented x 4; answers questions appropriately. In no distress. Awaiting radiology.

## 2010-10-30 NOTE — ED Notes (Signed)
Assumed c/o pt from Lethea Killings, RN.

## 2010-10-30 NOTE — Telephone Encounter (Signed)
On 01/30/2010 patient had an Exploratory Laparotomy with appendectomy by Dr. Harlow Asa.  Today patient comes to the office with a small open site to the lower part of the incision.  Dr. Redmond Pulling examined the patient. No signs of infection was noted. Patient was told to keep the area covered with a dry clean gauze.  Patient was given a follow up appointment with Dr. Harlow Asa next week.

## 2010-10-30 NOTE — ED Notes (Signed)
Pt states he woke up with "stiff neck" on Monday. Pt states he went to walk in clinic and was told he had arthritis. Pt states pain is no better.

## 2010-11-06 ENCOUNTER — Encounter (INDEPENDENT_AMBULATORY_CARE_PROVIDER_SITE_OTHER): Payer: Self-pay | Admitting: Surgery

## 2010-11-06 ENCOUNTER — Ambulatory Visit (INDEPENDENT_AMBULATORY_CARE_PROVIDER_SITE_OTHER): Payer: Medicare Other | Admitting: Surgery

## 2010-11-06 VITALS — BP 144/82 | HR 70 | Temp 97.9°F | Resp 16 | Ht 65.0 in | Wt 188.2 lb

## 2010-11-06 DIAGNOSIS — T148XXD Other injury of unspecified body region, subsequent encounter: Secondary | ICD-10-CM | POA: Insufficient documentation

## 2010-11-06 DIAGNOSIS — T07XXXA Unspecified multiple injuries, initial encounter: Secondary | ICD-10-CM

## 2010-11-06 NOTE — Progress Notes (Signed)
Visit Diagnoses: 1. Wound healing, delayed     HISTORY: Patient is a 78 year old black male who underwent open appendectomy for perforated appendicitis with abscess on February 04, 2010. Over the past few weeks he has noted some bloody drainage from the inferior portion of his healed surgical wound.   PERTINENT REVIEW OF SYSTEMS: No significant pain. No change in bowel habits. No fever. No chills.   EXAM: HEENT: normocephalic; pupils equal and reactive; sclerae clear; dentition good; mucous membranes moist NECK:  symmetric on extension; no palpable anterior or posterior cervical lymphadenopathy; no supraclavicular masses; no tenderness ABDOMEN: Slight induration at inferior aspect of midline incision. No ulceration. No drainage. NEURO: no gross focal deficits; no sign of tremor   IMPRESSION: Inflammatory changes at site of open appendectomy, suspect small suture abscess which has spontaneously drained   PLAN: Patient will apply topical antibiotic ointment to the site twice daily for the next 2 weeks. If drainage persists he will return. The wound may require exploration and removal of permanent suture material.   [name], MD, [facility]

## 2010-11-06 NOTE — Patient Instructions (Signed)
Apply triple antibiotic ointment to wound twice daily for two weeks.  Miguel Tucker

## 2011-05-26 ENCOUNTER — Encounter (INDEPENDENT_AMBULATORY_CARE_PROVIDER_SITE_OTHER): Payer: Self-pay | Admitting: Surgery

## 2011-05-26 ENCOUNTER — Ambulatory Visit (INDEPENDENT_AMBULATORY_CARE_PROVIDER_SITE_OTHER): Payer: Medicare Other | Admitting: Surgery

## 2011-05-26 VITALS — BP 148/86 | HR 80 | Temp 98.9°F | Resp 16 | Ht 65.0 in | Wt 180.4 lb

## 2011-05-26 DIAGNOSIS — T148XXD Other injury of unspecified body region, subsequent encounter: Secondary | ICD-10-CM

## 2011-05-26 DIAGNOSIS — T07XXXA Unspecified multiple injuries, initial encounter: Secondary | ICD-10-CM

## 2011-05-26 NOTE — Progress Notes (Signed)
Chief Complaint  Patient presents with  . New Evaluation    history of appendectomy - new wound problem - walk-in to clinic    HISTORY: The patient is a 78 year old black male who underwent exploratory laparotomy for perforated appendicitis in February 2012. Postoperatively he had delayed wound healing. He has developed a chronic draining tract from the inferior portion of his midline incision which likely represents an underlying suture granuloma. Patient returns to the clinic today with persistent drainage.  Past Medical History  Diagnosis Date  . Hypertension   . Hypokalemia   . Diabetes mellitus     type 2     Current Outpatient Prescriptions  Medication Sig Dispense Refill  . amLODipine (NORVASC) 5 MG tablet Take 5 mg by mouth daily.        [name] dutasteride (AVODART) 0.5 MG capsule Take 0.5 mg by mouth daily.        [name] losartan-hydrochlorothiazide (HYZAAR) 100-25 MG per tablet Take 1 tablet by mouth daily.        . metFORMIN (GLUCOPHAGE) 500 MG tablet Take 500 mg by mouth 2 (two) times daily with a meal.        . montelukast (SINGULAIR) 10 MG tablet Take 10 mg by mouth at bedtime.        . potassium chloride SA (K-DUR,KLOR-CON) 20 MEQ tablet Take 20 mEq by mouth daily.        [name] terazosin (HYTRIN) 5 MG capsule Take 0.5 mg by mouth at bedtime.           Allergies  Allergen Reactions  . Penicillins Itching    On arms only.     History reviewed. No pertinent family history.   History   Social History  . Marital Status: Married    Spouse Name: N/A    Number of Children: N/A  . Years of Education: N/A   Social History Main Topics  . Smoking status: Former Smoker    Quit date: 11/06/1975  . Smokeless tobacco: Never Used  . Alcohol Use: No  . Drug Use: No  . Sexually Active: None   Other Topics Concern  . None   Social History Narrative  . None     REVIEW OF SYSTEMS - PERTINENT POSITIVES ONLY: No significant pain, persistent intermittent drainage from the  inferior midline incision, no fever  EXAM: Filed Vitals:   05/26/11 1057  BP: 148/86  Pulse: 80  Temp: 98.9 F (37.2 C)  Resp: 16    HEENT: normocephalic; pupils equal and reactive; sclerae clear; dentition good; mucous membranes moist NECK:  symmetric on extension; no palpable anterior or posterior cervical lymphadenopathy; no supraclavicular masses; no tenderness CHEST: clear to auscultation bilaterally without rales, rhonchi, or wheezes CARDIAC: regular rate and rhythm without significant murmur; peripheral pulses are full ABDOMEN: soft without distension; bowel sounds present; no mass; no hepatosplenomegaly; no hernia; Well-healed midline incision with the exception of an ulcerated lesion at the inferior aspect of the wound. There is chronic granulation tissue. There is no visible suture material. The wound was cauterized with silver nitrate and a Band-Aid is applied as dressing. EXT:  non-tender without edema; no deformity NEURO: no gross focal deficits; no sign of tremor   LABORATORY RESULTS: See [facility] (CHL-Epic) for most recent results   RADIOLOGY RESULTS: See [facility] (CHL-Epic) for most recent results   IMPRESSION: #1 history of perforated appendicitis, status post exploratory laparotomy #2 chronic suture granuloma, draining sinus  PLAN: I discussed with the patient  and his wife the indications for surgical debridement and removal of underlying suture material. This can be performed as an outpatient procedure under anesthesia. I will leave the wound open and we will pack it with moist gauze twice daily and allow it to heal by secondary intention. Patient and his wife understand and agree to proceed.  The risks and benefits of the procedure have been discussed at length with the patient.  The patient understands the proposed procedure, potential alternative treatments, and the course of recovery to be expected.  All of the patient's questions have been  answered at this time.  The patient wishes to proceed with surgery.  [name], MD, [facility]   Visit Diagnoses: 1. Wound healing, delayed     Primary Care Physician: Haywood Pao, MD, MD

## 2011-05-26 NOTE — Patient Instructions (Signed)
Cover wound with dry bandage as needed.  [name], MD, [facility] Office: [phone]

## 2011-05-27 ENCOUNTER — Encounter (HOSPITAL_COMMUNITY)
Admission: RE | Admit: 2011-05-27 | Discharge: 2011-05-27 | Disposition: A | Discharge status: Home / Self Care | Payer: Medicare Other | Source: Ambulatory Visit | Attending: Surgery | Admitting: Surgery

## 2011-05-27 ENCOUNTER — Encounter (HOSPITAL_COMMUNITY): Payer: Self-pay

## 2011-05-27 ENCOUNTER — Encounter (HOSPITAL_COMMUNITY): Payer: Self-pay | Admitting: Pharmacy Technician

## 2011-05-27 ENCOUNTER — Ambulatory Visit (HOSPITAL_COMMUNITY)
Admission: RE | Admit: 2011-05-27 | Discharge: 2011-05-27 | Disposition: A | Discharge status: Home / Self Care | Payer: Medicare Other | Source: Ambulatory Visit | Attending: Surgery | Admitting: Surgery

## 2011-05-27 DIAGNOSIS — Z01812 Encounter for preprocedural laboratory examination: Secondary | ICD-10-CM | POA: Insufficient documentation

## 2011-05-27 DIAGNOSIS — IMO0002 Reserved for concepts with insufficient information to code with codable children: Secondary | ICD-10-CM | POA: Insufficient documentation

## 2011-05-27 DIAGNOSIS — Y849 Medical procedure, unspecified as the cause of abnormal reaction of the patient, or of later complication, without mention of misadventure at the time of the procedure: Secondary | ICD-10-CM | POA: Insufficient documentation

## 2011-05-27 DIAGNOSIS — Z01818 Encounter for other preprocedural examination: Secondary | ICD-10-CM | POA: Insufficient documentation

## 2011-05-27 LAB — BASIC METABOLIC PANEL
BUN: 24 mg/dL — ABNORMAL HIGH (ref 6–23)
Calcium: 8.8 mg/dL (ref 8.4–10.5)
Chloride: 105 mEq/L (ref 96–112)
Creatinine, Ser: 1.82 mg/dL — ABNORMAL HIGH (ref 0.50–1.35)
GFR calc Af Amer: 39 mL/min — ABNORMAL LOW (ref >90)
GFR calc non Af Amer: 34 mL/min — ABNORMAL LOW (ref >90)

## 2011-05-27 LAB — CBC
HCT: 37.4 % — ABNORMAL LOW (ref 39.0–52.0)
MCH: 27.8 pg (ref 26.0–34.0)
MCHC: 33.4 g/dL (ref 30.0–36.0)
MCV: 83.1 fL (ref 78.0–100.0)
Platelets: 228 10*3/uL (ref 150–400)
RDW: 14.6 % (ref 11.5–15.5)

## 2011-05-27 NOTE — Progress Notes (Signed)
05/27/11 1509  OBSTRUCTIVE SLEEP APNEA  Have you ever been diagnosed with sleep apnea through a sleep study? No  Do you snore loudly (loud enough to be heard through closed doors)?  1  Do you often feel tired, fatigued, or sleepy during the daytime? 0  Has anyone observed you stop breathing during your sleep? 1  Do you have, or are you being treated for high blood pressure? 1  BMI more than 35 kg/m2? 0  Age over 76 years old? 1  Neck circumference greater than 40 cm/18 inches? 0  Gender: 1  Obstructive Sleep Apnea Score 5   Score 4 or greater  Updated health history

## 2011-05-27 NOTE — Patient Instructions (Signed)
[place]  05/27/2011   Your procedure is scheduled on:  06/01/11 1130am-1238pm  Report to [place] at 0900 AM.  Call this number if you have problems the morning of surgery: [phone]   Remember:   Do not eat food:After Midnight.  May have clear liquids:until Midnight .  [name]  Take these medicines the morning of surgery with A SIP OF WATER:   Do not wear jewelry,   Do not wear lotions, powders, or perfumes.  . Men may shave face and neck.  Do not bring valuables to the hospital.  Contacts, dentures or bridgework may not be worn into surgery.  .   Patients discharged the day of surgery will not be allowed to drive home.  Name and phone number of your driver:  Special Instructions: CHG Shower Use Special Wash: 1/2 bottle night before surgery and 1/2 bottle morning of surgery. shower chin to toes with CHG.  Wash face and private parts with regular soap.    Please read over the following fact sheets that you were given: MRSA Information, coughing and deep breathing exercises, leg exercises

## 2011-06-01 ENCOUNTER — Encounter (HOSPITAL_COMMUNITY): Payer: Self-pay | Admitting: *Deleted

## 2011-06-01 ENCOUNTER — Ambulatory Visit (HOSPITAL_COMMUNITY)
Admission: RE | Admit: 2011-06-01 | Discharge: 2011-06-01 | Disposition: A | Discharge status: Home / Self Care | Payer: Medicare Other | Source: Ambulatory Visit | Attending: Surgery | Admitting: Surgery

## 2011-06-01 ENCOUNTER — Encounter (HOSPITAL_COMMUNITY)
Admission: RE | Disposition: A | Discharge status: Home / Self Care | Payer: Self-pay | Source: Ambulatory Visit | Attending: Surgery

## 2011-06-01 ENCOUNTER — Ambulatory Visit (HOSPITAL_COMMUNITY): Payer: Medicare Other | Admitting: *Deleted

## 2011-06-01 DIAGNOSIS — T148XXD Other injury of unspecified body region, subsequent encounter: Secondary | ICD-10-CM

## 2011-06-01 DIAGNOSIS — IMO0002 Reserved for concepts with insufficient information to code with codable children: Secondary | ICD-10-CM | POA: Insufficient documentation

## 2011-06-01 DIAGNOSIS — Z79899 Other long term (current) drug therapy: Secondary | ICD-10-CM | POA: Insufficient documentation

## 2011-06-01 DIAGNOSIS — I10 Essential (primary) hypertension: Secondary | ICD-10-CM | POA: Insufficient documentation

## 2011-06-01 DIAGNOSIS — E119 Type 2 diabetes mellitus without complications: Secondary | ICD-10-CM | POA: Insufficient documentation

## 2011-06-01 DIAGNOSIS — Y836 Removal of other organ (partial) (total) as the cause of abnormal reaction of the patient, or of later complication, without mention of misadventure at the time of the procedure: Secondary | ICD-10-CM | POA: Insufficient documentation

## 2011-06-01 HISTORY — PX: WOUND DEBRIDEMENT: SHX247

## 2011-06-01 SURGERY — DEBRIDEMENT, WOUND, ABDOMEN
Anesthesia: General | Site: Abdomen | Wound class: Dirty or Infected

## 2011-06-01 MED ORDER — HYDROCODONE-ACETAMINOPHEN 5-325 MG PO TABS: 1.0000 | ORAL_TABLET | ORAL | Status: AC | PRN

## 2011-06-01 MED ORDER — FENTANYL CITRATE 0.05 MG/ML IJ SOLN
INTRAMUSCULAR | Status: DC | PRN
  Administered 2011-06-01: 100 ug via INTRAVENOUS

## 2011-06-01 MED ORDER — FENTANYL CITRATE 0.05 MG/ML IJ SOLN: 25.0000 ug | INTRAMUSCULAR | Status: DC | PRN

## 2011-06-01 MED ORDER — METOCLOPRAMIDE HCL 5 MG/ML IJ SOLN
INTRAMUSCULAR | Status: DC | PRN
  Administered 2011-06-01: 10 mg via INTRAVENOUS

## 2011-06-01 MED ORDER — LACTATED RINGERS IV SOLN
INTRAVENOUS | Status: DC
  Administered 2011-06-01: 1000 mL via INTRAVENOUS

## 2011-06-01 MED ORDER — PROPOFOL 10 MG/ML IV EMUL
INTRAVENOUS | Status: DC | PRN
  Administered 2011-06-01: 200 mg via INTRAVENOUS

## 2011-06-01 MED ORDER — LACTATED RINGERS IV SOLN
INTRAVENOUS | Status: DC
  Administered 2011-06-01: 12:00:00 via INTRAVENOUS

## 2011-06-01 MED ORDER — ACETAMINOPHEN 10 MG/ML IV SOLN
INTRAVENOUS | Status: AC
  Filled 2011-06-01: qty 100

## 2011-06-01 MED ORDER — PROMETHAZINE HCL 25 MG/ML IJ SOLN: 6.2500 mg | INTRAMUSCULAR | Status: DC | PRN

## 2011-06-01 MED ORDER — ACETAMINOPHEN 10 MG/ML IV SOLN
INTRAVENOUS | Status: DC | PRN
  Administered 2011-06-01: 1000 mg via INTRAVENOUS

## 2011-06-01 MED ORDER — KETAMINE HCL 10 MG/ML IJ SOLN
INTRAMUSCULAR | Status: DC | PRN
  Administered 2011-06-01 (×2): 20 mg via INTRAVENOUS

## 2011-06-01 MED ORDER — BUPIVACAINE HCL (PF) 0.25 % IJ SOLN
INTRAMUSCULAR | Status: DC | PRN
  Administered 2011-06-01: 10 mL

## 2011-06-01 MED ORDER — DEXAMETHASONE SODIUM PHOSPHATE 4 MG/ML IJ SOLN
INTRAMUSCULAR | Status: DC | PRN
  Administered 2011-06-01: 10 mg via INTRAVENOUS

## 2011-06-01 MED ORDER — CIPROFLOXACIN IN D5W 400 MG/200ML IV SOLN
INTRAVENOUS | Status: AC
  Filled 2011-06-01: qty 200

## 2011-06-01 MED ORDER — CIPROFLOXACIN IN D5W 400 MG/200ML IV SOLN
400.0000 mg | INTRAVENOUS | Status: AC
  Administered 2011-06-01: 400 mg via INTRAVENOUS

## 2011-06-01 MED ORDER — ONDANSETRON HCL 4 MG/2ML IJ SOLN
INTRAMUSCULAR | Status: DC | PRN
  Administered 2011-06-01: 4 mg via INTRAVENOUS

## 2011-06-01 MED ORDER — GLYCOPYRROLATE 0.2 MG/ML IJ SOLN
INTRAMUSCULAR | Status: DC | PRN
  Administered 2011-06-01 (×3): .05 mg via INTRAVENOUS

## 2011-06-01 MED ORDER — LIDOCAINE HCL (CARDIAC) 20 MG/ML IV SOLN
INTRAVENOUS | Status: DC | PRN
  Administered 2011-06-01: 50 mg via INTRAVENOUS

## 2011-06-01 MED ORDER — BUPIVACAINE HCL (PF) 0.25 % IJ SOLN
INTRAMUSCULAR | Status: AC
  Filled 2011-06-01: qty 30

## 2011-06-01 MED ORDER — 0.9 % SODIUM CHLORIDE (POUR BTL) OPTIME
TOPICAL | Status: DC | PRN
  Administered 2011-06-01: 1000 mL

## 2011-06-01 SURGICAL SUPPLY — 49 items
APPLICATOR COTTON TIP 6IN STRL (MISCELLANEOUS) ×2 IMPLANT
BLADE EXTENDED COATED 6.5IN (ELECTRODE) IMPLANT
BLADE HEX COATED 2.75 (ELECTRODE) ×2 IMPLANT
BLADE SURG SZ10 CARB STEEL (BLADE) IMPLANT
CANISTER SUCTION 2500CC (MISCELLANEOUS) ×2 IMPLANT
CLIP TI LARGE 6 (CLIP) IMPLANT
CLOTH BEACON ORANGE TIMEOUT ST (SAFETY) ×2 IMPLANT
COVER MAYO STAND STRL (DRAPES) ×2 IMPLANT
DRAPE LAPAROSCOPIC ABDOMINAL (DRAPES) ×2 IMPLANT
DRAPE LG THREE QUARTER DISP (DRAPES) IMPLANT
DRAPE WARM FLUID 44X44 (DRAPE) ×1 IMPLANT
DRSG PAD ABDOMINAL 8X10 ST (GAUZE/BANDAGES/DRESSINGS) ×1 IMPLANT
ELECT REM PT RETURN 9FT ADLT (ELECTROSURGICAL) ×2
ELECTRODE REM PT RTRN 9FT ADLT (ELECTROSURGICAL) ×1 IMPLANT
EVACUATOR DRAINAGE 10X20 100CC (DRAIN) IMPLANT
EVACUATOR SILICONE 100CC (DRAIN)
GLOVE BIOGEL PI IND STRL 7.0 (GLOVE) ×1 IMPLANT
GLOVE BIOGEL PI INDICATOR 7.0 (GLOVE) ×1
GLOVE SURG ORTHO 8.0 STRL STRW (GLOVE) ×4 IMPLANT
GOWN STRL NON-REIN LRG LVL3 (GOWN DISPOSABLE) ×2 IMPLANT
GOWN STRL REIN XL XLG (GOWN DISPOSABLE) ×4 IMPLANT
KIT BASIN OR (CUSTOM PROCEDURE TRAY) ×2 IMPLANT
LEGGING LITHOTOMY PAIR STRL (DRAPES) IMPLANT
LIGASURE IMPACT 36 18CM CVD LR (INSTRUMENTS) IMPLANT
NS IRRIG 1000ML POUR BTL (IV SOLUTION) ×4 IMPLANT
PACK GENERAL/GYN (CUSTOM PROCEDURE TRAY) ×2 IMPLANT
SCALPEL HARMONIC ACE (MISCELLANEOUS) IMPLANT
SEALER TISSUE X1 CVD JAW (INSTRUMENTS) IMPLANT
SPONGE GAUZE 4X4 12PLY (GAUZE/BANDAGES/DRESSINGS) ×2 IMPLANT
SPONGE LAP 18X18 X RAY DECT (DISPOSABLE) IMPLANT
STAPLER VISISTAT 35W (STAPLE) ×2 IMPLANT
SUCTION POOLE TIP (SUCTIONS) ×2 IMPLANT
SUT ETHILON 3 0 PS 1 (SUTURE) IMPLANT
SUT NOV 1 T60/GS (SUTURE) IMPLANT
SUT NOVA NAB DX-16 0-1 5-0 T12 (SUTURE) IMPLANT
SUT NOVA T20/GS 25 (SUTURE) IMPLANT
SUT SILK 2 0 (SUTURE)
SUT SILK 2 0 SH CR/8 (SUTURE) ×1 IMPLANT
SUT SILK 2 0SH CR/8 30 (SUTURE) IMPLANT
SUT SILK 2-0 18XBRD TIE 12 (SUTURE) ×1 IMPLANT
SUT SILK 2-0 30XBRD TIE 12 (SUTURE) IMPLANT
SUT SILK 3 0 (SUTURE)
SUT SILK 3 0 SH CR/8 (SUTURE) ×1 IMPLANT
SUT SILK 3-0 18XBRD TIE 12 (SUTURE) ×2 IMPLANT
SUT VIC AB 4-0 SH 18 (SUTURE) IMPLANT
TAPE CLOTH SURG 4X10 WHT LF (GAUZE/BANDAGES/DRESSINGS) ×1 IMPLANT
TOWEL OR 17X26 10 PK STRL BLUE (TOWEL DISPOSABLE) ×4 IMPLANT
TRAY FOLEY CATH 14FRSI W/METER (CATHETERS) ×1 IMPLANT
YANKAUER SUCT BULB TIP NO VENT (SUCTIONS) ×2 IMPLANT

## 2011-06-01 NOTE — Interval H&P Note (Signed)
History and Physical Interval Note:  06/01/2011 11:13 AM  Miguel Tucker  has presented today for surgery, with the diagnosis of chronic abdominal wound suture graneloma.   The various methods of treatment have been discussed with the patient and family. After consideration of risks, benefits and other options for treatment, the patient has consented to    Procedure(s) (LRB): DEBRIDEMENT ABDOMINAL WOUND (N/A) as a surgical intervention .    The patients' history has been reviewed, patient examined, no change in status, stable for surgery.  I have reviewed the patients' chart and labs.  Questions were answered to the patient's satisfaction.    [name], MD, [facility] Office: [place]

## 2011-06-01 NOTE — Preoperative (Signed)
Beta Blockers   Reason not to administer Beta Blockers:Not Applicable, pt not on home BB

## 2011-06-01 NOTE — Anesthesia Preprocedure Evaluation (Signed)
Anesthesia Evaluation  Patient identified by MRN, date of birth, ID band Patient awake    Reviewed: Allergy & Precautions, H&P , NPO status , Patient's Chart, lab work & pertinent test results  Airway Mallampati: II TM Distance: >3 FB Neck ROM: Full    Dental  (+) Partial Lower, Partial Upper, Dental Advisory Given and Poor Dentition   Pulmonary former smoker   Pulmonary exam normal       Cardiovascular hypertension, Pt. on medications Rhythm:Regular Rate:Normal     Neuro/Psych negative neurological ROS  negative psych ROS   GI/Hepatic negative GI ROS, Neg liver ROS,   Endo/Other  negative endocrine ROSDiabetes mellitus-, Well Controlled, Type 2, Oral Hypoglycemic Agents  Renal/GU Renal InsufficiencyRenal diseasenegative Renal ROS  negative genitourinary   Musculoskeletal negative musculoskeletal ROS (+)   Abdominal   Peds  Hematology negative hematology ROS (+)   Anesthesia Other Findings   Reproductive/Obstetrics negative OB ROS                           Anesthesia Physical Anesthesia Plan  ASA: II  Anesthesia Plan: General   Post-op Pain Management:    Induction: Intravenous  Airway Management Planned: LMA  Additional Equipment:   Intra-op Plan:   Post-operative Plan: Extubation in OR  Informed Consent: I have reviewed the patients History and Physical, chart, labs and discussed the procedure including the risks, benefits and alternatives for the proposed anesthesia with the patient or authorized representative who has indicated his/her understanding and acceptance.   Dental advisory given  Plan Discussed with: CRNA  Anesthesia Plan Comments:         Anesthesia Quick Evaluation

## 2011-06-01 NOTE — Transfer of Care (Signed)
Immediate Anesthesia Transfer of Care Note  Patient: Miguel Tucker  Procedure(s) Performed: Procedure(s) (LRB): DEBRIDEMENT ABDOMINAL WOUND (N/A)  Patient Location: PACU  Anesthesia Type: General  Level of Consciousness: sedated and unresponsive, LMA removed deep  Airway & Oxygen Therapy: Patient Spontanous Breathing and Patient connected to face mask  Post-op Assessment: Report given to PACU RN and Post -op Vital signs reviewed and stable Post vital signs: Reviewed and stable  Complications: No apparent anesthesia complications

## 2011-06-01 NOTE — H&P (View-Only) (Signed)
Chief Complaint  Patient presents with  . New Evaluation    history of appendectomy - new wound problem - walk-in to clinic    HISTORY: The patient is a 79 year old black male who underwent exploratory laparotomy for perforated appendicitis in February 2012. Postoperatively he had delayed wound healing. He has developed a chronic draining tract from the inferior portion of his midline incision which likely represents an underlying suture granuloma. Patient returns to the clinic today with persistent drainage.  Past Medical History  Diagnosis Date  . Hypertension   . Hypokalemia   . Diabetes mellitus     type 2     Current Outpatient Prescriptions  Medication Sig Dispense Refill  . amLODipine (NORVASC) 5 MG tablet Take 5 mg by mouth daily.        [name] dutasteride (AVODART) 0.5 MG capsule Take 0.5 mg by mouth daily.        [name] losartan-hydrochlorothiazide (HYZAAR) 100-25 MG per tablet Take 1 tablet by mouth daily.        . metFORMIN (GLUCOPHAGE) 500 MG tablet Take 500 mg by mouth 2 (two) times daily with a meal.        . montelukast (SINGULAIR) 10 MG tablet Take 10 mg by mouth at bedtime.        . potassium chloride SA (K-DUR,KLOR-CON) 20 MEQ tablet Take 20 mEq by mouth daily.        [name] terazosin (HYTRIN) 5 MG capsule Take 0.5 mg by mouth at bedtime.           Allergies  Allergen Reactions  . Penicillins Itching    On arms only.     History reviewed. No pertinent family history.   History   Social History  . Marital Status: Married    Spouse Name: N/A    Number of Children: N/A  . Years of Education: N/A   Social History Main Topics  . Smoking status: Former Smoker    Quit date: 11/06/1975  . Smokeless tobacco: Never Used  . Alcohol Use: No  . Drug Use: No  . Sexually Active: None   Other Topics Concern  . None   Social History Narrative  . None     REVIEW OF SYSTEMS - PERTINENT POSITIVES ONLY: No significant pain, persistent intermittent drainage from the  inferior midline incision, no fever  EXAM: Filed Vitals:   05/26/11 1057  BP: 148/86  Pulse: 80  Temp: 98.9 F (37.2 C)  Resp: 16    HEENT: normocephalic; pupils equal and reactive; sclerae clear; dentition good; mucous membranes moist NECK:  symmetric on extension; no palpable anterior or posterior cervical lymphadenopathy; no supraclavicular masses; no tenderness CHEST: clear to auscultation bilaterally without rales, rhonchi, or wheezes CARDIAC: regular rate and rhythm without significant murmur; peripheral pulses are full ABDOMEN: soft without distension; bowel sounds present; no mass; no hepatosplenomegaly; no hernia; Well-healed midline incision with the exception of an ulcerated lesion at the inferior aspect of the wound. There is chronic granulation tissue. There is no visible suture material. The wound was cauterized with silver nitrate and a Band-Aid is applied as dressing. EXT:  non-tender without edema; no deformity NEURO: no gross focal deficits; no sign of tremor   LABORATORY RESULTS: See [facility] (CHL-Epic) for most recent results   RADIOLOGY RESULTS: See [facility] (CHL-Epic) for most recent results   IMPRESSION: #1 history of perforated appendicitis, status post exploratory laparotomy #2 chronic suture granuloma, draining sinus  PLAN: I discussed with the patient  and his wife the indications for surgical debridement and removal of underlying suture material. This can be performed as an outpatient procedure under anesthesia. I will leave the wound open and we will pack it with moist gauze twice daily and allow it to heal by secondary intention. Patient and his wife understand and agree to proceed.  The risks and benefits of the procedure have been discussed at length with the patient.  The patient understands the proposed procedure, potential alternative treatments, and the course of recovery to be expected.  All of the patient's questions have been  answered at this time.  The patient wishes to proceed with surgery.  [name], MD, [facility]   Visit Diagnoses: 1. Wound healing, delayed     Primary Care Physician: Haywood Pao, MD, MD

## 2011-06-01 NOTE — Anesthesia Postprocedure Evaluation (Signed)
Anesthesia Post Note  Patient: Miguel Tucker  Procedure(s) Performed: Procedure(s) (LRB): DEBRIDEMENT ABDOMINAL WOUND (N/A)  Anesthesia type: General  Patient location: PACU  Post pain: Pain level controlled  Post assessment: Post-op Vital signs reviewed  Last Vitals:  Filed Vitals:   06/01/11 1230  BP: 150/81  Pulse: 80  Temp: 36.4 C  Resp: 13    Post vital signs: Reviewed  Level of consciousness: sedated  Complications: No apparent anesthesia complications

## 2011-06-01 NOTE — Brief Op Note (Signed)
06/01/2011  11:51 AM  PATIENT:  Miguel Tucker  78 y.o. male  PRE-OPERATIVE DIAGNOSIS:  chronic abdominal wound, suture granuloma   POST-OPERATIVE DIAGNOSIS:  same  PROCEDURE:   Debridement abdominal wound, excision of suture granuloma  SURGEON:  Surgeon(s) and Role:    * [name], MD - Primary  PHYSICIAN ASSISTANT:   EBL:     BLOOD ADMINISTERED:none  DRAINS: none   LOCAL MEDICATIONS USED:  MARCAINE     SPECIMEN:  Excision  DISPOSITION OF SPECIMEN:  PATHOLOGY  COUNTS:  YES  TOURNIQUET:  * No tourniquets in log *  DICTATION: .Other Dictation: Dictation Number [id number]  PLAN OF CARE: Discharge to home after PACU  PATIENT DISPOSITION:  PACU - hemodynamically stable.   Delay start of Pharmacological VTE agent (>24hrs) due to surgical blood loss or risk of bleeding: yes  [name], MD, [facility] Office: [phone]

## 2011-06-02 LAB — GLUCOSE, CAPILLARY: Glucose-Capillary: 114 mg/dL — ABNORMAL HIGH (ref 70–99)

## 2011-06-02 NOTE — Op Note (Signed)
NAME:  Miguel Tucker, Miguel Tucker NO.:  [id number]  MEDICAL RECORD NO.:  [id number]  LOCATION:  [facility]                         FACILITY:  [facility]  PHYSICIAN:  [name], MD      DATE OF BIRTH:  [birth year]  DATE OF PROCEDURE:  01 Jun 2011                              OPERATIVE REPORT   PREOPERATIVE DIAGNOSES:  Chronic abdominal wound, suture granuloma.  POSTOPERATIVE DIAGNOSIS:  Chronic abdominal wound, suture granuloma.  PROCEDURE:  Excision and debridement of chronic abdominal wound and suture granuloma (4 x 2 x 3 cm).  SURGEON:  [name], MD, FACS  ANESTHESIA:  General per Dr. Freddie Apley.  ESTIMATED BLOOD LOSS:  Minimal.  PREPARATION:  Betadine.  COMPLICATIONS:  None.  INDICATIONS:  The patient is a 78 year old, black male, who underwent exploratory laparotomy for perforated appendicitis in February 2012. Midline abdominal incision has experienced delayed wound healing with a chronic draining sinus tract from the inferior aspect of the midline incision.  He now comes to surgery for wound debridement, exploration, and excision of suture granuloma.  BODY OF REPORT:  Procedure was done in OR #11 at the [facility].  The patient was brought to the operating room, placed in supine position on the operating room table.  Following administration of general anesthesia, the patient was prepped and draped in the usual strict aseptic fashion.  After ascertaining that an adequate level of anesthesia had been achieved, an elliptical incision was made in the midline at the inferior aspect of the midline incision. Using the electrocautery for hemostasis, an ellipse of skin measuring 4 cm in length x 2 cm in width was excised with the draining sinus tract in the central portion.  Dissection was carried through scar tissue and subcutaneous tissues to the fascia.  Two Novafil sutures were identified and extracted.  They were surrounded by chronic  granulation tissue. Tissue was debrided.  The area was cauterized with the electrocautery. There does not appear to be any sign of extension into the abdominal cavity.  Wound was irrigated with warm saline.  Wound was packed open with a 4 x 4 gauze sponges saturated with Betadine.  Dry gauze dressings were placed.  Tissue was submitted to Pathology for review.  The patient tolerated the procedure well.   [name], MD, FACS   TMG/MEDQ  D:  06/01/2011  T:  06/02/2011  Job:  [id number]  cc:   Haywood Pao, [name] Fax: [phone]

## 2011-06-03 ENCOUNTER — Encounter (HOSPITAL_COMMUNITY): Payer: Self-pay | Admitting: Surgery

## 2011-06-04 ENCOUNTER — Telehealth (INDEPENDENT_AMBULATORY_CARE_PROVIDER_SITE_OTHER): Payer: Self-pay | Admitting: General Surgery

## 2011-06-04 NOTE — Telephone Encounter (Signed)
Received call from [place], Lattie Haw, that pt's wife is refusing to learn is wet-to-dry wound dressing changes.  She explained that this is routine to teach a family member to do them, as home health will probably not be paid to come out twice daily for the length of time needed to care for his wound.  She staunchly refuses to do them.  Nurse is asking if you will consider a wound vac?  It can be used with a "wet" sponge if you would like.  Please advise.

## 2011-06-04 NOTE — Telephone Encounter (Signed)
After updating Dr. Harlow Asa and receiving orders, Lattie Haw [facility] nurse) given orders to use the negative pressure dressing.  She understands.

## 2011-06-29 ENCOUNTER — Emergency Department (HOSPITAL_COMMUNITY): Payer: Medicare Other

## 2011-06-29 ENCOUNTER — Emergency Department (HOSPITAL_COMMUNITY)
Admission: EM | Admit: 2011-06-29 | Discharge: 2011-06-29 | Disposition: A | Discharge status: Home / Self Care | Payer: Medicare Other | Attending: Emergency Medicine | Admitting: Emergency Medicine

## 2011-06-29 ENCOUNTER — Encounter (HOSPITAL_COMMUNITY): Payer: Self-pay | Admitting: Emergency Medicine

## 2011-06-29 DIAGNOSIS — Z87891 Personal history of nicotine dependence: Secondary | ICD-10-CM | POA: Insufficient documentation

## 2011-06-29 DIAGNOSIS — M436 Torticollis: Secondary | ICD-10-CM

## 2011-06-29 DIAGNOSIS — E119 Type 2 diabetes mellitus without complications: Secondary | ICD-10-CM | POA: Insufficient documentation

## 2011-06-29 DIAGNOSIS — I10 Essential (primary) hypertension: Secondary | ICD-10-CM | POA: Insufficient documentation

## 2011-06-29 DIAGNOSIS — M542 Cervicalgia: Secondary | ICD-10-CM | POA: Insufficient documentation

## 2011-06-29 MED ORDER — HYDROCODONE-ACETAMINOPHEN 5-325 MG PO TABS: 1.0000 | ORAL_TABLET | ORAL | Status: AC | PRN

## 2011-06-29 MED ORDER — HYDROCODONE-ACETAMINOPHEN 5-325 MG PO TABS
1.0000 | ORAL_TABLET | Freq: Once | ORAL | Status: AC
  Administered 2011-06-29: 1 via ORAL
  Filled 2011-06-29: qty 1

## 2011-06-29 NOTE — ED Notes (Signed)
Pt. Rec'vd. To rm 14.  Assumed care.

## 2011-06-29 NOTE — ED Provider Notes (Signed)
This chart was scribed for [name]. [name], MD by [name]. The patient was seen in room APA14/APA14 and the patient's care was started at 11:09 PM.   CSN: [id number]  Arrival date & time 06/29/11  2043   First MD Initiated Contact with Patient 06/29/11 2300      Chief Complaint  Patient presents with  . Neck Pain    (Consider location/radiation/quality/duration/timing/severity/associated sxs/prior treatment) HPI  Miguel Tucker is a 78 y.o. male who presents to the Emergency Department complaining of sudden onset, persistent of constant, gradually worsening, moderate neck pain onset 3 days ago. The pain radiates nowhere. Pt denies any injury. Pt states that he saw his PCP and was given flexeril w/ no relief.  Pt applied heat yesterday w/ no relief. There are no other associated symptoms and no other alleviating or aggravating factors.  PCP [name]      Past Medical History  Diagnosis Date  . Hypertension   . Hypokalemia   . Diabetes mellitus     type 2    Past Surgical History  Procedure Date  . Appendectomy   . Rotator cuff repair   . Other surgical history     testicular tumor- benign   . Wound debridement 06/01/2011    Procedure: DEBRIDEMENT ABDOMINAL WOUND;  Surgeon: [name], MD;  Location: [facility];  Service: General;  Laterality: N/A;  Remove Sutures     History reviewed. No pertinent family history.  History  Substance Use Topics  . Smoking status: Former Smoker    Quit date: 11/06/1975  . Smokeless tobacco: Never Used  . Alcohol Use: No      Review of Systems  Constitutional: Negative for fever.       10 Systems reviewed and are negative for acute change except as noted in the HPI.  HENT: Negative for congestion.   Eyes: Negative for discharge and redness.  Respiratory: Negative for cough and shortness of breath.   Cardiovascular: Negative for chest pain.  Gastrointestinal: Negative for vomiting and abdominal pain.    Musculoskeletal: Negative for back pain.       Neck pain  Skin: Negative for rash.  Neurological: Negative for syncope, numbness and headaches.  Psychiatric/Behavioral:       No behavior change.      Allergies  Penicillins  Home Medications   Current Outpatient Rx  Name Route Sig Dispense Refill  . AMLODIPINE BESYLATE 5 MG PO TABS Oral Take 5 mg by mouth every morning.     . ASPIRIN EC 81 MG PO TBEC Oral Take 81 mg by mouth every morning.     . CYCLOBENZAPRINE HCL 10 MG PO TABS Oral Take 10 mg by mouth 3 (three) times daily as needed.    . DUTASTERIDE 0.5 MG PO CAPS Oral Take 0.5 mg by mouth every morning.     [name] LOSARTAN POTASSIUM-HCTZ 100-25 MG PO TABS Oral Take 1 tablet by mouth every morning.     [name] METFORMIN HCL 500 MG PO TABS Oral Take 500 mg by mouth 2 (two) times daily.    [name] MONTELUKAST SODIUM 10 MG PO TABS Oral Take 10 mg by mouth every morning.     [name] POTASSIUM CHLORIDE CRYS ER 20 MEQ PO TBCR Oral Take 20 mEq by mouth every morning.     [name] TERAZOSIN HCL 5 MG PO CAPS Oral Take 5 mg by mouth every morning.       BP 174/99  Pulse 99  Temp 98.7 F (37.1 C) (Oral)  Resp 16  Ht 5\' 5"  (1.651 m)  Wt 180 lb (81.647 kg)  BMI 29.95 kg/m2  SpO2 98%  Physical Exam  Nursing note and vitals reviewed. Constitutional: He is oriented to person, place, and time. He appears well-developed and well-nourished. No distress.       Awake, alert, nontoxic appearance.  HENT:  Head: Normocephalic and atraumatic.        Focal tenderness on left side of neck to palpation, pt move head through ROM limitied solely by pain   Eyes: EOM are normal. Pupils are equal, round, and reactive to light. Right eye exhibits no discharge. Left eye exhibits no discharge.  Neck: Neck supple. No tracheal deviation present. No thyromegaly present.        Focal tenderness on left side of neck to palpation, pt move head through ROM limitied solely by pain   Cardiovascular: Normal rate and regular rhythm.    Pulmonary/Chest: Effort normal and breath sounds normal. No respiratory distress. He exhibits no tenderness.  Abdominal: Soft. He exhibits no distension. There is no tenderness. There is no rebound.  Musculoskeletal: Normal range of motion. He exhibits no edema and no tenderness.       Baseline ROM, no obvious new focal weakness.  Neurological: He is alert and oriented to person, place, and time. No sensory deficit.       Mental status and motor strength appears baseline for patient and situation.  Skin: Skin is warm and dry. No rash noted.  Psychiatric: He has a normal mood and affect. His behavior is normal.    ED Course  Procedures (including critical care time)  DIAGNOSTIC STUDIES: Oxygen Saturation is 98% on room air, normal by my interpretation.    COORDINATION OF CARE:    Labs Reviewed - No data to display Dg Cervical Spine Complete  06/29/2011  *RADIOLOGY REPORT*  Clinical Data: 78 year old male with neck pain.  No known injury.  CERVICAL SPINE - COMPLETE 4+ VIEW  Comparison: Cervical spine CT 10/30/2010.  Findings: Stable straightening and reversal of cervical lordosis. Prevertebral soft tissue contours are stable within normal limits. Diffuse spondylosis and endplate spurring again noted. Bilateral posterior element alignment is within normal limits.  AP alignment and lung apices within normal limits.  C1-C2 alignment and odontoid within normal limits. Cervicothoracic junction alignment is within normal limits.  IMPRESSION: Diffuse cervical spondylosis again noted. No acute fracture or listhesis identified in the cervical spine.  Ligamentous injury is not excluded.  Original Report Authenticated By: [name], [name]        MDM  Patient presents with neck pain that he's had for 3 days. He has been using Flexeril for 24 hours without relief. X-rays of the cervical spine do not show any acute injury. Patient was given analgesics.Pt stable in ED with no significant  deterioration in condition.The patient appears reasonably screened and/or stabilized for discharge and I doubt any other medical condition or other [facility] requiring further screening, evaluation, or treatment in the ED at this time prior to discharge.  I personally performed the services described in this documentation, which was scribed in my presence. The recorded information has been reviewed and considered.   MDM Reviewed: nursing note and vitals Interpretation: x-ray          Gypsy Balsam. [name], MD 06/30/11 [phone]

## 2011-06-29 NOTE — ED Notes (Signed)
Pt complains of pain in his neck. Denies any injury. States his neck feels stiff. Tells [place] he was seen by his primary doctor and given flexeril but it has not helped. Pt is AAx4

## 2011-06-29 NOTE — Discharge Instructions (Signed)
Apply heat to the neck for comfort. Do the exercises I showed you. Continue to use the muscle relaxant. Use the stronger pain medicine as needed.    Torticollis, Acute You have suddenly (acutely) developed a twisted neck (torticollis). This is usually a self-limited condition. CAUSES  Acute torticollis may be caused by malposition, trauma or infection. Most commonly, acute torticollis is caused by sleeping in an awkward position. Torticollis may also be caused by the flexion, extension or twisting of the neck muscles beyond their normal position. Sometimes, the exact cause may not be known. SYMPTOMS  Usually, there is pain and limited movement of the neck. Your neck may twist to one side. DIAGNOSIS  The diagnosis is often made by physical examination. X-rays, CT scans or MRIs may be done if there is a history of trauma or concern of infection. TREATMENT  For a common, stiff neck that develops during sleep, treatment is focused on relaxing the contracted neck muscle. Medications (including shots) may be used to treat the problem. Most cases resolve in several days. Torticollis usually responds to conservative physical therapy. If left untreated, the shortened and spastic neck muscle can cause deformities in the face and neck. Rarely, surgery is required. HOME CARE INSTRUCTIONS   Use over-the-counter and prescription medications as directed by your caregiver.   Do stretching exercises and massage the neck as directed by your caregiver.   Follow up with physical therapy if needed and as directed by your caregiver.  SEEK IMMEDIATE MEDICAL CARE IF:   You develop difficulty breathing or noisy breathing (stridor).   You drool, develop trouble swallowing or have pain with swallowing.   You develop numbness or weakness in the hands or feet.   You have changes in speech or vision.   You have problems with urination or bowel movements.   You have difficulty walking.   You have a fever.    You have increased pain.  MAKE SURE YOU:   Understand these instructions.   Will watch your condition.   Will get help right away if you are not doing well or get worse.  Document Released: 12/19/1999 Document Revised: 12/10/2010 Document Reviewed: 01/29/2009 [facility] Patient Information 2012 [place].

## 2011-06-30 ENCOUNTER — Encounter (INDEPENDENT_AMBULATORY_CARE_PROVIDER_SITE_OTHER): Payer: Self-pay | Admitting: Surgery

## 2011-06-30 ENCOUNTER — Ambulatory Visit (INDEPENDENT_AMBULATORY_CARE_PROVIDER_SITE_OTHER): Payer: Medicare Other | Admitting: Surgery

## 2011-06-30 ENCOUNTER — Telehealth (INDEPENDENT_AMBULATORY_CARE_PROVIDER_SITE_OTHER): Payer: Self-pay | Admitting: General Surgery

## 2011-06-30 VITALS — BP 158/86 | HR 88 | Temp 98.0°F | Resp 16 | Ht 65.0 in | Wt 184.2 lb

## 2011-06-30 DIAGNOSIS — T07XXXA Unspecified multiple injuries, initial encounter: Secondary | ICD-10-CM

## 2011-06-30 DIAGNOSIS — T148XXD Other injury of unspecified body region, subsequent encounter: Secondary | ICD-10-CM

## 2011-06-30 NOTE — Telephone Encounter (Signed)
Called [facility] to advise that per Dr. Gala Lewandowsky request the order for negative pressure dressing has been cancelled. Patient wound has healed well and the patient has been instructed to apply antibiotic ointment 3 times daily and to change the dressing twice daily. Also advised the patient requested the person scheduled to arrive at his home today arrive after lunchtime.   Patient instruction to come back and see Dr. Harlow Asa in 3 weeks for wound check.

## 2011-06-30 NOTE — Patient Instructions (Signed)
Dressing should be changed to oral 3 times daily. Applied topical antibiotic ointment and dry gauze. Wound should be cleansed in the shower once daily.

## 2011-06-30 NOTE — Progress Notes (Signed)
General Surgery [facility]  Visit Diagnoses: 1. Wound healing, delayed     HISTORY: Patient returns for postoperative visit having undergone debridement of midline abdominal incision and excision of suture granulomas. He has a negative pressure dressing system in place.  EXAM: Dressings are removed. Wound has essentially closed. There is clean granulation tissue which is relatively superficial. Overall the wound diameter is less than 2 cm. This was dressed with topical antibiotic ointment and a dry gauze dressing.  IMPRESSION: Midline abdominal wound healing by secondary intention  PLAN: We will discontinue the negative pressure dressing at this time. We will discontinue home health nursing at this time. Patient will dress the wound 2 or 3 times daily with topical antibiotic ointment and dry gauze. He will shower the wound once daily. He will return to see me for a final wound check in 3 weeks.  [name], MD, [facility]

## 2011-07-19 ENCOUNTER — Encounter (INDEPENDENT_AMBULATORY_CARE_PROVIDER_SITE_OTHER): Payer: Self-pay | Admitting: General Surgery

## 2011-07-19 NOTE — Progress Notes (Unsigned)
Faxed signed authorization by Dr. Harlow Asa to approve wound care for patient. Original sent to medical records to be scanned into chart.

## 2011-07-21 ENCOUNTER — Encounter (INDEPENDENT_AMBULATORY_CARE_PROVIDER_SITE_OTHER): Payer: Self-pay | Admitting: Surgery

## 2011-07-21 ENCOUNTER — Ambulatory Visit (INDEPENDENT_AMBULATORY_CARE_PROVIDER_SITE_OTHER): Payer: Medicare Other | Admitting: Surgery

## 2011-07-21 VITALS — BP 136/80 | HR 98 | Temp 98.4°F | Resp 14 | Ht 65.0 in | Wt 182.4 lb

## 2011-07-21 DIAGNOSIS — T148XXD Other injury of unspecified body region, subsequent encounter: Secondary | ICD-10-CM

## 2011-07-21 DIAGNOSIS — T07XXXA Unspecified multiple injuries, initial encounter: Secondary | ICD-10-CM

## 2011-07-21 NOTE — Patient Instructions (Signed)
  COCOA BUTTER & VITAMIN E CREAM  ([employer] or other brand)  Apply cocoa butter/vitamin E cream to your incision 2 - 3 times daily.  Massage cream into incision for one minute with each application.  Use sunscreen (50 SPF or higher) for first 6 months after surgery if area is exposed to sun.  You may substitute Mederma or other scar reducing creams as desired.   

## 2011-07-21 NOTE — Progress Notes (Signed)
General Surgery [facility]  Visit Diagnoses: 1. Wound healing, delayed     HISTORY: The patient returns for final wound check.  EXAM: Midline abdominal incision has completely closed and has epithelialized. There is no drainage. With Valsalva is no sign of hernia.  Patient does have a small umbilical hernia which is asymptomatic.  IMPRESSION: #1 status post wound debridement and removal of suture material, wound now completely healed #2 asymptomatic umbilical hernia  PLAN: Patient will begin applying topical creams to his incisions. He will return as needed.  I have explained to the patient and his wife that if the umbilical hernia becomes larger or become symptomatic, he will need to return and consider outpatient surgical repair.  [name], MD, [facility]

## 2011-12-09 ENCOUNTER — Ambulatory Visit (HOSPITAL_COMMUNITY)
Admission: RE | Admit: 2011-12-09 | Discharge: 2011-12-09 | Disposition: A | Discharge status: Home / Self Care | Payer: Medicare Other | Source: Ambulatory Visit | Attending: Orthopedic Surgery | Admitting: Orthopedic Surgery

## 2011-12-09 DIAGNOSIS — M5410 Radiculopathy, site unspecified: Secondary | ICD-10-CM | POA: Insufficient documentation

## 2011-12-09 DIAGNOSIS — M25659 Stiffness of unspecified hip, not elsewhere classified: Secondary | ICD-10-CM | POA: Insufficient documentation

## 2011-12-09 DIAGNOSIS — IMO0001 Reserved for inherently not codable concepts without codable children: Secondary | ICD-10-CM | POA: Insufficient documentation

## 2011-12-09 DIAGNOSIS — R262 Difficulty in walking, not elsewhere classified: Secondary | ICD-10-CM | POA: Insufficient documentation

## 2011-12-09 NOTE — Evaluation (Signed)
Physical Therapy Evaluation  Patient Details  Name: Miguel Tucker MRN: [id number] Date of Birth: April 16, 1930  Today's Date: 12/09/2011 Time:8:50  - 9:36    Visit#: 1  of 8   Re-eval: 01/08/12 Assessment Diagnosis: Lumbar spondylosis Prior Therapy: none   Authorization: Medicare   Authorization Visit#: 1  of 8    Past Medical History:  Past Medical History  Diagnosis Date  . Hypertension   . Hypokalemia   . Diabetes mellitus     type 2   Past Surgical History:  Past Surgical History  Procedure Date  . Appendectomy   . Rotator cuff repair   . Other surgical history     testicular tumor- benign   . Wound debridement 06/01/2011    Procedure: DEBRIDEMENT ABDOMINAL WOUND;  Surgeon: [name], MD;  Location: [facility];  Service: General;  Laterality: N/A;  Remove Sutures     Subjective Symptoms/Limitations Symptoms: Miguel Tucker states that he has had back pain on and off for about a year.  He fell back in Jan. 2012 and has had intermittent radicular pain down his left leg to his knee area ever since.  He states that  he can tell that he is not walking the way he use to walk prior to his fall fell.  He is being referred to PT to try and obsolve his pain and normalize his gait. How long can you sit comfortably?: No problem sitting. How long can you stand comfortably?: No problem How long can you walk comfortably?: Pain with walking sometimes other times he does not have problems but when he is having pain he has pain immediately upon walking. Patient Stated Goals: to have no pain;  He has difficulty with the pain about once a month but he states that he knows his gait is off . 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) Leisure: Hobbies-no  Cognition/Observation Cognition Overall Cognitive Status: Appears within functional limits for tasks assessed Observation/Other Assessments Observations: protruding abdominal mm  Sensation/Coordination/Flexibility/Functional Tests Functional Tests Functional Tests: Oswestry 14/100  Assessment RLE Strength Right Hip Flexion: 5/5 Right Hip Extension: 5/5 Right Hip ABduction: 5/5 Right Hip ADduction: 5/5 Right Knee Flexion: 5/5 Right Knee Extension: 5/5 Right Ankle Dorsiflexion: 5/5 LLE Strength Left Hip Flexion: 5/5 Left Hip Extension: 5/5 Left Hip ABduction: 5/5 Left Hip ADduction: 5/5 Left Knee Flexion: 5/5 Left Knee Extension: 5/5 Left Ankle Dorsiflexion: 5/5 Lumbar AROM Lumbar Flexion: ddecreased 25% w L SI jt hypomobile Lumbar Extension: wnl Lumbar - Right Side Bend: decreased 20% Lumbar - Left Side Bend: wnl Lumbar - Right Rotation: decreased 20% Lumbar - Left Rotation: decreased 20 Palpation Palpation: tight mm  Exercise/Treatments Mobility/Balance  Posture/Postural Control Posture/Postural Control: Postural limitations Postural Limitations: SI rotation noted   Aerobic Tread Mill:  (TM not available ambulated 5 x around department)   Supine Bridge: 5 reps Other Supine Lumbar Exercises: adduction isometric ex z 10   Manual Therapy Manual Therapy: Other (comment) Other Manual Therapy: SI adjustment.  Physical Therapy Assessment and Plan PT Assessment and Plan Clinical Impression Statement: Pt with radicular leg pain probably secondary to SI dysfunction who will benefit from skilled PT to decrease his pain and normalize his gait. Pt will benefit  from skilled therapeutic intervention in order to improve on the following deficits: Abnormal gait;Difficulty walking;Pain Rehab Potential: Good PT Frequency: Min 2X/week PT Duration: 4 weeks PT Treatment/Interventions: Gait training;Patient/family education;Therapeutic  exercise PT Plan: Patient to begin gait trainer next treatment; begin ab sets, bent knee raise and clam for stability.  Reassess SI jt to see if pt still needs adjustment.    Goals Home Exercise Program Pt will Perform Home Exercise Program: Independently PT Short Term Goals Time to Complete Short Term Goals: 2 weeks PT Short Term Goal 1: Pt to state pain, when having it, is no greater than a 5.  PT Long Term Goals Time to Complete Long Term Goals: 4 weeks PT Long Term Goal 1: I in advance HEP PT Long Term Goal 2: No radicular pain for the past 2 weeks Long Term Goal 3: Pt to state that he feels as if he is walking normal again.  Problem List Patient Active Problem List  Diagnosis  . Wound healing, delayed  . Difficulty in walking  . Radicular pain of left lower extremity    PT - End of Session Activity Tolerance: Patient tolerated treatment well PT Plan of Care PT Home Exercise Plan: given  GP Functional Assessment Tool Used: Oswestry Functional Limitation: Mobility: Walking and moving around Mobility: Walking and Moving Around Current Status [id number]): At least 1 percent but less than 20 percent impaired, limited or restricted Mobility: Walking and Moving Around Goal Status [phone]): 0 percent impaired, limited or restricted  Miguel Tucker 12/09/2011, 12:26 PM  Physician Documentation Your signature is required to indicate approval of the treatment plan as stated above.  Please sign and either send electronically or make a copy of this report for your files and return this physician signed original.   Please mark one 1.__approve of plan  2. ___approve of plan with the following conditions.   ______________________________                                                          _____________________ Physician Signature                                                                                                             Date

## 2011-12-14 ENCOUNTER — Ambulatory Visit (HOSPITAL_COMMUNITY)
Admission: RE | Admit: 2011-12-14 | Discharge: 2011-12-14 | Disposition: A | Discharge status: Home / Self Care | Payer: Medicare Other | Source: Ambulatory Visit | Attending: Orthopedic Surgery | Admitting: Orthopedic Surgery

## 2011-12-14 NOTE — Progress Notes (Signed)
Physical Therapy Treatment Patient Details  Name: Miguel Tucker MRN: [id number] Date of Birth: 1930/08/12  Today's Date: 12/14/2011 Time: [id number] PT Time Calculation (min): 33 min Visit#: 2  of 8   Re-eval: 01/08/12 Authorization: Medicare  Authorization Visit#: 2  of 8   Charges:  therex 28'  Subjective: Symptoms/Limitations Symptoms: Pt. reports stiffness in L hip/SI area when first getting up in the morning but dissapates as moves around.  Currently without pain today. Pain Assessment Currently in Pain?: No/denies   Exercise/Treatments Aerobic Tread Mill: Gt trainer following MET for SI; 6' at 0.48 cyc/sec Supine Ab Set: 10 reps;5 seconds Clam: 10 reps Bent Knee Raise: 10 reps Bridge: 10 reps Straight Leg Raise: 10 reps Other Supine Lumbar Exercises: adduction isometric 10X5"   Manual Therapy Manual Therapy: Other (comment) Other Manual Therapy: MET for L hip posterior rotation.  B LL measured at 84cm bilaterally; no LL descrepancy noted.  Physical Therapy Assessment and Plan PT Assessment and Plan Clinical Impression Statement: SI checked at beginning of session.  L hip was rotated posteriorly; MET performed to correct with good results.  B LE's measured to rule out LL descrepancy and was even at 84cm bilaterally.  New exercises added per PT POC without pain but needed multimodal cues to stabilize appropriately.  PT Plan: Continue to progress and increase core strength and stability.  Continue to monitor SI alignment.     Problem List Patient Active Problem List  Diagnosis  . Wound healing, delayed  . Difficulty in walking  . Radicular pain of left lower extremity    PT - End of Session Activity Tolerance: Patient tolerated treatment well General Behavior During Session: [facility] for tasks performed Cognition: [facility] for tasks performed   Teena Irani, PTA/CLT 12/14/2011, 10:17 AM

## 2011-12-16 ENCOUNTER — Ambulatory Visit (HOSPITAL_COMMUNITY)
Admission: RE | Admit: 2011-12-16 | Discharge: 2011-12-16 | Disposition: A | Discharge status: Home / Self Care | Payer: Medicare Other | Source: Ambulatory Visit | Attending: Orthopedic Surgery | Admitting: Orthopedic Surgery

## 2011-12-16 DIAGNOSIS — R262 Difficulty in walking, not elsewhere classified: Secondary | ICD-10-CM

## 2011-12-16 DIAGNOSIS — M5410 Radiculopathy, site unspecified: Secondary | ICD-10-CM

## 2011-12-16 NOTE — Progress Notes (Signed)
Physical Therapy Treatment Patient Details  Name: Miguel Tucker MRN: [id number] Date of Birth: [birth year]  Today's Date: 12/16/2011 Time: 0920-1008 PT Time Calculation (min): 48 min  Visit#: 3  of 8   Re-eval: 01/07/12    Authorization: Medicare.  Authorization Time Period:    Authorization Visit#:   of     Subjective: Symptoms/Limitations Symptoms: No real pain just stiffness.      Exercise/Treatments   Aerobic Tread Mill: Gt trainer following MET for SI; 6' at 0.48 cyc/sec   Supine Ab Set: 10 reps;5 seconds Bridge: 10 reps x 4 Other Supine Lumbar Exercises: adduction isometric/abduction isometric 10X5" x4     Modalities Modalities: Ultrasound Manual Therapy Manual Therapy: Other (comment) Other Manual Therapy: MET for L hip posterior rotation done twice. 75% successful.  Did [place] to relax mm and did Manual a 3rd time with 100% correction Ultrasound Ultrasound Location: L SI Ultrasound Parameters: 1.5 cm2 using 1 MgHZ Ultrasound Goals: Other (Comment);Pain (relax mm so manual is more successful)  Physical Therapy Assessment and Plan PT Assessment and Plan Clinical Impression Statement: Good alignment at end of treatment.  PT will be going out of town for Christmas x 2 weeks will contact clinic when he returns PT Plan: Reassess SI alignment when pt returns from vacation.    Goals Home Exercise Program PT Goal: Perform Home Exercise Program - Progress: Met PT Short Term Goals PT Short Term Goal 1 - Progress: Met PT Long Term Goals PT Long Term Goal 1 - Progress: Progressing toward goal PT Long Term Goal 2 - Progress: Progressing toward goal Long Term Goal 3 Progress: Progressing toward goal  Problem List Patient Active Problem List  Diagnosis  . Wound healing, delayed  . Difficulty in walking  . Radicular pain of left lower extremity    PT - End of Session Activity Tolerance: Patient tolerated treatment well General Behavior During Session:  [facility] for tasks performed Cognition: [facility] for tasks performed PT Plan of Care PT Home Exercise Plan: given  GP    Vianey Caniglia,CINDY 12/16/2011, 12:18 PM

## 2013-06-05 ENCOUNTER — Other Ambulatory Visit: Payer: Self-pay | Admitting: Physician Assistant

## 2013-06-05 DIAGNOSIS — R937 Abnormal findings on diagnostic imaging of other parts of musculoskeletal system: Secondary | ICD-10-CM

## 2013-06-08 ENCOUNTER — Other Ambulatory Visit: Payer: Medicare Other

## 2013-06-12 ENCOUNTER — Ambulatory Visit
Admission: RE | Admit: 2013-06-12 | Discharge: 2013-06-12 | Disposition: A | Discharge status: Home / Self Care | Payer: Medicare Other | Source: Ambulatory Visit | Attending: Physician Assistant | Admitting: Physician Assistant

## 2013-06-12 DIAGNOSIS — R937 Abnormal findings on diagnostic imaging of other parts of musculoskeletal system: Secondary | ICD-10-CM

## 2013-06-15 ENCOUNTER — Other Ambulatory Visit: Payer: 59

## 2014-08-23 ENCOUNTER — Other Ambulatory Visit (HOSPITAL_COMMUNITY): Payer: Self-pay

## 2014-08-26 ENCOUNTER — Encounter (HOSPITAL_COMMUNITY): Payer: PRIVATE HEALTH INSURANCE | Attending: Nephrology

## 2014-09-15 ENCOUNTER — Emergency Department (HOSPITAL_COMMUNITY): Payer: Medicare Other

## 2014-09-15 ENCOUNTER — Encounter (HOSPITAL_COMMUNITY): Payer: Self-pay | Admitting: Emergency Medicine

## 2014-09-15 ENCOUNTER — Emergency Department (HOSPITAL_COMMUNITY)
Admission: EM | Admit: 2014-09-15 | Discharge: 2014-09-15 | Disposition: A | Discharge status: Home / Self Care | Payer: Medicare Other | Attending: Emergency Medicine | Admitting: Emergency Medicine

## 2014-09-15 DIAGNOSIS — Y998 Other external cause status: Secondary | ICD-10-CM | POA: Diagnosis not present

## 2014-09-15 DIAGNOSIS — Z7982 Long term (current) use of aspirin: Secondary | ICD-10-CM | POA: Diagnosis not present

## 2014-09-15 DIAGNOSIS — Z880 Allergy status to penicillin: Secondary | ICD-10-CM | POA: Diagnosis not present

## 2014-09-15 DIAGNOSIS — I10 Essential (primary) hypertension: Secondary | ICD-10-CM | POA: Insufficient documentation

## 2014-09-15 DIAGNOSIS — S39012A Strain of muscle, fascia and tendon of lower back, initial encounter: Secondary | ICD-10-CM

## 2014-09-15 DIAGNOSIS — Y9389 Activity, other specified: Secondary | ICD-10-CM | POA: Insufficient documentation

## 2014-09-15 DIAGNOSIS — Z79899 Other long term (current) drug therapy: Secondary | ICD-10-CM | POA: Diagnosis not present

## 2014-09-15 DIAGNOSIS — Z87891 Personal history of nicotine dependence: Secondary | ICD-10-CM | POA: Diagnosis not present

## 2014-09-15 DIAGNOSIS — X58XXXA Exposure to other specified factors, initial encounter: Secondary | ICD-10-CM | POA: Diagnosis not present

## 2014-09-15 DIAGNOSIS — E119 Type 2 diabetes mellitus without complications: Secondary | ICD-10-CM | POA: Insufficient documentation

## 2014-09-15 DIAGNOSIS — Y9289 Other specified places as the place of occurrence of the external cause: Secondary | ICD-10-CM | POA: Insufficient documentation

## 2014-09-15 DIAGNOSIS — S3992XA Unspecified injury of lower back, initial encounter: Secondary | ICD-10-CM | POA: Diagnosis present

## 2014-09-15 MED ORDER — ACETAMINOPHEN 325 MG PO TABS
650.0000 mg | ORAL_TABLET | Freq: Once | ORAL | Status: AC
  Administered 2014-09-15: 650 mg via ORAL
  Filled 2014-09-15: qty 2

## 2014-09-15 NOTE — Discharge Instructions (Signed)
Back Pain, Adult Low back pain is very common. About 1 in 5 people have back pain.The cause of low back pain is rarely dangerous. The pain often gets better over time.About half of people with a sudden onset of back pain feel better in just 2 weeks. About 8 in 10 people feel better by 6 weeks.  CAUSES Some common causes of back pain include:  Strain of the muscles or ligaments supporting the spine.  Wear and tear (degeneration) of the spinal discs.  Arthritis.  Direct injury to the back. DIAGNOSIS Most of the time, the direct cause of low back pain is not known.However, back pain can be treated effectively even when the exact cause of the pain is unknown.Answering your caregiver's questions about your overall health and symptoms is one of the most accurate ways to make sure the cause of your pain is not dangerous. If your caregiver needs more information, he or she may order lab work or imaging tests (X-rays or MRIs).However, even if imaging tests show changes in your back, this usually does not require surgery. HOME CARE INSTRUCTIONS For many people, back pain returns.Since low back pain is rarely dangerous, it is often a condition that people can learn to manageon their own.   Remain active. It is stressful on the back to sit or stand in one place. Do not sit, drive, or stand in one place for more than 30 minutes at a time. Take short walks on level surfaces as soon as pain allows.Try to increase the length of time you walk each day.  Do not stay in bed.Resting more than 1 or 2 days can delay your recovery.  Do not avoid exercise or work.Your body is made to move.It is not dangerous to be active, even though your back may hurt.Your back will likely heal faster if you return to being active before your pain is gone.  Pay attention to your body when you bend and lift. Many people have less discomfortwhen lifting if they bend their knees, keep the load close to their bodies,and  avoid twisting. Often, the most comfortable positions are those that put less stress on your recovering back.  Find a comfortable position to sleep. Use a firm mattress and lie on your side with your knees slightly bent. If you lie on your back, put a pillow under your knees.  Only take over-the-counter or prescription medicines as directed by your caregiver. Over-the-counter medicines to reduce pain and inflammation are often the most helpful.Your caregiver may prescribe muscle relaxant drugs.These medicines help dull your pain so you can more quickly return to your normal activities and healthy exercise.  Put ice on the injured area.  Put ice in a plastic bag.  Place a towel between your skin and the bag.  Leave the ice on for 15-20 minutes, 03-04 times a day for the first 2 to 3 days. After that, ice and heat may be alternated to reduce pain and spasms.  Ask your caregiver about trying back exercises and gentle massage. This may be of some benefit.  Avoid feeling anxious or stressed.Stress increases muscle tension and can worsen back pain.It is important to recognize when you are anxious or stressed and learn ways to manage it.Exercise is a great option. SEEK MEDICAL CARE IF:  You have pain that is not relieved with rest or medicine.  You have pain that does not improve in 1 week.  You have new symptoms.  You are generally not feeling well. SEEK   IMMEDIATE MEDICAL CARE IF:   You have pain that radiates from your back into your legs.  You develop new bowel or bladder control problems.  You have unusual weakness or numbness in your arms or legs.  You develop nausea or vomiting.  You develop abdominal pain.  You feel faint. Document Released: 12/21/2004 Document Revised: 06/22/2011 Document Reviewed: 04/24/2013 ExitCare Patient Information 2015 [employer]. This information is not intended to replace advice given to you by your health care provider. Make sure you  discuss any questions you have with your health care provider.  

## 2014-09-15 NOTE — ED Notes (Signed)
Pt reports back pain ever since trying to help wife off of the floor this am. Pt denies any other symptoms. nad noted.

## 2014-09-15 NOTE — ED Provider Notes (Signed)
CSN: [id number]     Arrival date & time 09/15/14  1204 History  This chart was scribed for non-physician practitioner, Hollace Kinnier. Saunders Revel, working with Virgel Manifold, MD by Terressa Koyanagi, ED Scribe. This patient was seen in room APFT21/APFT21 and the patient's care was started at 1:12 PM.   Chief Complaint  Patient presents with  . Back Pain   The history is provided by the patient. No language interpreter was used.   PCP: Haywood Pao, MD HPI Comments: Miguel Tucker is a 81 y.o. male, with PMHx noted below including hypokalemia, HTN, DMTII, who presents to the Emergency Department complaining of traumatic, gradual onset, mid-back pain onset this morning after pt tried to help his wife get up from the floor. Pt denies abd pain or any other Sx at this time.   Past Medical History  Diagnosis Date  . Hypertension   . Hypokalemia   . Diabetes mellitus     type 2   Past Surgical History  Procedure Laterality Date  . Appendectomy    . Rotator cuff repair    . Other surgical history      testicular tumor- benign   . Wound debridement  06/01/2011    Procedure: DEBRIDEMENT ABDOMINAL WOUND;  Surgeon: [name], MD;  Location: [facility] ORS;  Service: General;  Laterality: N/A;  Remove Sutures    History reviewed. No pertinent family history. Social History  Substance Use Topics  . Smoking status: Former Smoker    Quit date: 11/06/1975  . Smokeless tobacco: Never Used  . Alcohol Use: No    Review of Systems  Constitutional: Negative for fever and chills.  Musculoskeletal: Positive for back pain.  All other systems reviewed and are negative.  Allergies  Penicillins  Home Medications   Prior to Admission medications   Medication Sig Start Date End Date Taking? Authorizing Provider  amLODipine (NORVASC) 5 MG tablet Take 5 mg by mouth every morning.    Yes Historical Provider, MD  aspirin EC 81 MG tablet Take 81 mg by mouth every morning.    Yes Historical Provider, MD   losartan-hydrochlorothiazide (HYZAAR) 100-25 MG per tablet Take 1 tablet by mouth every morning.    Yes Historical Provider, MD  montelukast (SINGULAIR) 10 MG tablet Take 10 mg by mouth every morning.    Yes Historical Provider, MD  Tamsulosin HCl (FLOMAX) 0.4 MG CAPS Take 0.4 mg by mouth daily.  06/08/11  Yes Historical Provider, MD  terazosin (HYTRIN) 5 MG capsule Take 5 mg by mouth every morning.    Yes Historical Provider, MD   Triage Vitals: BP 127/66 mmHg  Pulse 62  Temp(Src) 97.6 F (36.4 C) (Oral)  Resp 18  Ht 5\' 5"  (1.651 m)  Wt 178 lb (80.74 kg)  BMI 29.62 kg/m2  SpO2 100% Physical Exam  Constitutional: He is oriented to person, place, and time. He appears well-developed and well-nourished.  HENT:  Head: Normocephalic.  Eyes: EOM are normal.  Neck: Normal range of motion.  Pulmonary/Chest: Effort normal.  Abdominal: He exhibits no distension.  Musculoskeletal: Normal range of motion. He exhibits tenderness.  Tender L5-S1  Neurological: He is alert and oriented to person, place, and time.  Psychiatric: He has a normal mood and affect.  Nursing note and vitals reviewed.   ED Course  Procedures (including critical care time) DIAGNOSTIC STUDIES: Oxygen Saturation is 100% on RA, nl by my interpretation.    COORDINATION OF CARE: 1:15 PM: Discussed treatment plan which  includes imaging with pt at bedside; patient verbalizes understanding and agrees with treatment plan.  Imaging Review Dg Lumbar Spine Complete  09/15/2014   CLINICAL DATA:  Lower back pain.  EXAM: LUMBAR SPINE - COMPLETE 4+ VIEW  COMPARISON:  Abdominal radiograph 02/13/2010. CT abdomen and pelvis 02/11/2010.  FINDINGS: Grade 1 anterolisthesis of L4 on L5 is unchanged from the prior CT. Vertebral body heights are preserved without evidence of compression fracture. There is mild disc space narrowing from L3-4 to L5-S1. Lower lumbar facet arthrosis is noted. No pars defects are seen. Mild endplate spurring is  present throughout the lumbar spine. Calcifications in the pelvis likely represent phleboliths.  IMPRESSION: No acute osseous abnormality identified. Mild lower lumbar disc and facet degeneration.   Electronically Signed   By: Logan Bores [name]   On: 09/15/2014 14:36   I have personally reviewed and evaluated these images and lab results as part of my medical decision-making.   MDM   Final diagnoses:  Lumbar strain, initial encounter    avs Tylenol for pain See [name] for recheck in 1 week if pain persist   Fransico Meadow, PA-C 09/15/14 1604  Virgel Manifold, MD 09/26/14 [phone]

## 2014-10-29 ENCOUNTER — Encounter (HOSPITAL_COMMUNITY): Payer: Self-pay

## 2014-10-29 ENCOUNTER — Encounter (HOSPITAL_COMMUNITY)
Admission: RE | Admit: 2014-10-29 | Discharge: 2014-10-29 | Disposition: A | Discharge status: Home / Self Care | Payer: Medicare Other | Source: Ambulatory Visit | Attending: Nephrology | Admitting: Nephrology

## 2014-10-29 DIAGNOSIS — D509 Iron deficiency anemia, unspecified: Secondary | ICD-10-CM | POA: Diagnosis present

## 2014-10-29 MED ORDER — SODIUM CHLORIDE 0.9 % IV SOLN
510.0000 mg | Freq: Once | INTRAVENOUS | Status: AC
  Administered 2014-10-29: 510 mg via INTRAVENOUS
  Filled 2014-10-29: qty 17

## 2014-10-29 MED ORDER — SODIUM CHLORIDE 0.9 % IV SOLN
Freq: Once | INTRAVENOUS | Status: AC
  Administered 2014-10-29: 13:00:00 via INTRAVENOUS

## 2014-10-29 NOTE — Discharge Instructions (Signed)
Food Basics for Chronic Kidney Disease  When your kidneys are not working well, they cannot remove waste and excess substances from your blood as effectively as they did before. This can lead to a buildup and imbalance of these substances, which can affect how your body functions. This buildup can also make your kidneys work harder, causing even more damage.  You may need to eat less of certain foods that can lead to the buildup of these substances in your body. By making the changes to your diet that are recommended by your dietitian or health care provider, you could possibly help prevent further kidney damage and delay or prevent the need for dialysis.  The following information can help give you a basic understanding of these substances and how they affect your bodily functions. The information also gives examples of foods that contain the highest amounts of these substances.  WHAT DO I NEED TO KNOW ABOUT SUBSTANCES IN MY FOOD THAT I MAY NEED TO ADJUST?  Food adjustments will be different for each person with chronic kidney disease. It is important that you see a dietitian who can help you determine the specific adjustments that you will need to make for each of the following substances:  Potassium  Potassium affects how steadily your heart beats. If too much potassium builds up in your blood, it can cause an irregular heartbeat or even a heart attack.  Examples of foods rich in potassium include:  · Milk.  · Fruits.  · Vegetables.  Phosphorus  Phosphorus is a mineral found in your bones. A balance between calcium and phosphorous is needed to build and maintain healthy bones. Too much phosphorus pulls calcium from your bones. This can make your bones weak and more likely to break. Too much phosphorus can also make your skin itch.  Examples of foods rich in phosphorus include:  · Milk and cheese.  · Dried beans.  · Peas.  · Colas.  · Nuts and peanut butter.  Animal Protein  Animal protein helps you make and keep  muscle. It also helps in the repair of your body's cells and tissues. One of the natural breakdown products of protein is a waste product called urea. When your kidneys are not working properly, they cannot remove wastes such as urea like they did before you developed chronic kidney disease. You will likely need to limit the amount of protein you eat to help prevent a buildup of urea in your blood. Examples of animal protein include:  · Meat (all types).  · Fish and seafood.  · Poultry.  · Eggs.  Sodium  Sodium, which is found in salt, helps maintain a healthy balance of fluids in your body. Too much sodium can increase your blood pressure level and have a negative affect on the function of your heart and lungs. Too much sodium also can cause your body to retain too much fluid, making your kidneys work harder. Examples of foods with high levels of sodium include:  · Salt seasonings.  · Soy sauce.  · Cured and processed meats.  · Salted crackers and snack foods.  · Fast food.  · Canned soups and most canned foods.  Glucose  Glucose provides energy for your body. If you have diabetes mellitus that is not properly controlled, you have too much glucose in your blood. Too much glucose in your blood can worsen the function of your kidneys by damaging small blood vessels. This prevents enough blood flow to your kidneys to   give them what they need to work. If you have diabetes mellitus and chronic kidney disease, it is important to maintain your blood glucose at a level recommended by your health care provider.  SHOULD I TAKE A VITAMIN AND MINERAL SUPPLEMENT?  Because you may need to avoid eating certain foods, you may not get all of the vitamins and minerals that would normally come from those foods. Your health care provider or dietitian may recommend that you take a supplement to ensure that you get all of the vitamins and minerals that your body needs.      This information is not intended to replace advice given to you  by your health care provider. Make sure you discuss any questions you have with your health care provider.     Document Released: 03/13/2002 Document Revised: 01/11/2014 Document Reviewed: 11/17/2012  Elsevier Interactive Patient Education ©2016 Elsevier [employer].

## 2014-12-24 ENCOUNTER — Encounter (HOSPITAL_COMMUNITY)
Admission: RE | Admit: 2014-12-24 | Discharge: 2014-12-24 | Disposition: A | Discharge status: Home / Self Care | Payer: Medicare Other | Source: Ambulatory Visit | Attending: Nephrology | Admitting: Nephrology

## 2014-12-24 DIAGNOSIS — D509 Iron deficiency anemia, unspecified: Secondary | ICD-10-CM | POA: Insufficient documentation

## 2014-12-24 MED ORDER — SODIUM CHLORIDE 0.9 % IV SOLN
INTRAVENOUS | Status: DC
  Administered 2014-12-24: 200 mL via INTRAVENOUS

## 2014-12-24 MED ORDER — SODIUM CHLORIDE 0.9 % IV SOLN
510.0000 mg | Freq: Once | INTRAVENOUS | Status: AC
  Administered 2014-12-24: 510 mg via INTRAVENOUS
  Filled 2014-12-24: qty 17

## 2015-01-09 ENCOUNTER — Encounter (HOSPITAL_COMMUNITY): Payer: Self-pay

## 2015-01-09 ENCOUNTER — Encounter (HOSPITAL_COMMUNITY)
Admission: RE | Admit: 2015-01-09 | Discharge: 2015-01-09 | Disposition: A | Discharge status: Home / Self Care | Payer: Medicare Other | Source: Ambulatory Visit | Attending: Nephrology | Admitting: Nephrology

## 2015-01-09 DIAGNOSIS — D509 Iron deficiency anemia, unspecified: Secondary | ICD-10-CM | POA: Diagnosis present

## 2015-01-09 MED ORDER — SODIUM CHLORIDE 0.9 % IV SOLN
INTRAVENOUS | Status: DC
  Administered 2015-01-09: 250 mL via INTRAVENOUS

## 2015-01-09 MED ORDER — SODIUM CHLORIDE 0.9 % IV SOLN
510.0000 mg | Freq: Once | INTRAVENOUS | Status: AC
  Administered 2015-01-09: 510 mg via INTRAVENOUS
  Filled 2015-01-09: qty 17

## 2015-05-02 ENCOUNTER — Other Ambulatory Visit: Payer: Self-pay | Admitting: *Deleted

## 2015-05-02 DIAGNOSIS — N184 Chronic kidney disease, stage 4 (severe): Secondary | ICD-10-CM

## 2015-05-02 DIAGNOSIS — Z01810 Encounter for preprocedural cardiovascular examination: Secondary | ICD-10-CM

## 2015-05-27 ENCOUNTER — Other Ambulatory Visit (HOSPITAL_COMMUNITY): Payer: PRIVATE HEALTH INSURANCE

## 2015-05-27 ENCOUNTER — Ambulatory Visit: Payer: PRIVATE HEALTH INSURANCE | Admitting: Vascular Surgery

## 2015-05-27 ENCOUNTER — Encounter (HOSPITAL_COMMUNITY): Payer: PRIVATE HEALTH INSURANCE

## 2015-06-25 ENCOUNTER — Encounter (HOSPITAL_COMMUNITY)
Admission: RE | Admit: 2015-06-25 | Discharge: 2015-06-25 | Disposition: A | Discharge status: Home / Self Care | Payer: Medicare Other | Source: Ambulatory Visit | Attending: Nephrology | Admitting: Nephrology

## 2015-06-25 DIAGNOSIS — N184 Chronic kidney disease, stage 4 (severe): Secondary | ICD-10-CM | POA: Insufficient documentation

## 2015-06-25 DIAGNOSIS — D638 Anemia in other chronic diseases classified elsewhere: Secondary | ICD-10-CM | POA: Diagnosis present

## 2015-06-25 LAB — IRON AND TIBC
IRON: 41 ug/dL — AB (ref 45–182)
SATURATION RATIOS: 15 % — AB (ref 17.9–39.5)
TIBC: 265 ug/dL (ref 250–450)
UIBC: 224 ug/dL

## 2015-06-25 LAB — HEMOGLOBIN AND HEMATOCRIT, BLOOD
HCT: 28.3 % — ABNORMAL LOW (ref 39.0–52.0)
Hemoglobin: 9.8 g/dL — ABNORMAL LOW (ref 13.0–17.0)

## 2015-06-25 LAB — FERRITIN: FERRITIN: 329 ng/mL (ref 24–336)

## 2015-06-25 MED ORDER — DARBEPOETIN ALFA 100 MCG/0.5ML IJ SOSY
100.0000 ug | PREFILLED_SYRINGE | Freq: Once | INTRAMUSCULAR | Status: AC
  Administered 2015-06-25: 100 ug via SUBCUTANEOUS

## 2015-06-25 MED ORDER — DARBEPOETIN ALFA 100 MCG/0.5ML IJ SOSY
PREFILLED_SYRINGE | INTRAMUSCULAR | Status: AC
  Filled 2015-06-25: qty 0.5

## 2015-06-25 NOTE — Discharge Instructions (Signed)
Darbepoetin Alfa injection What is this medicine? DARBEPOETIN ALFA (dar be POE e tin AL fa) helps your body make more red blood cells. It is used to treat anemia caused by chronic kidney failure and chemotherapy. This medicine may be used for other purposes; ask your health care provider or pharmacist if you have questions. What should I tell my health care provider before I take this medicine? They need to know if you have any of these conditions: -blood clotting disorders or history of blood clots -cancer patient not on chemotherapy -cystic fibrosis -heart disease, such as angina, heart failure, or a history of a heart attack -hemoglobin level of 12 g/dL or greater -high blood pressure -low levels of folate, iron, or vitamin B12 -seizures -an unusual or allergic reaction to darbepoetin, erythropoietin, albumin, hamster proteins, latex, other medicines, foods, dyes, or preservatives -pregnant or trying to get pregnant -breast-feeding How should I use this medicine? This medicine is for injection into a vein or under the skin. It is usually given by a health care professional in a hospital or clinic setting. If you get this medicine at home, you will be taught how to prepare and give this medicine. Do not shake the solution before you withdraw a dose. Use exactly as directed. Take your medicine at regular intervals. Do not take your medicine more often than directed. It is important that you put your used needles and syringes in a special sharps container. Do not put them in a trash can. If you do not have a sharps container, call your pharmacist or healthcare provider to get one. Talk to your pediatrician regarding the use of this medicine in children. While this medicine may be used in children as young as 1 year for selected conditions, precautions do apply. Overdosage: If you think you have taken too much of this medicine contact a poison control center or emergency room at once. NOTE:  This medicine is only for you. Do not share this medicine with others. What if I miss a dose? If you miss a dose, take it as soon as you can. If it is almost time for your next dose, take only that dose. Do not take double or extra doses. What may interact with this medicine? Do not take this medicine with any of the following medications: -epoetin alfa This list may not describe all possible interactions. Give your health care provider a list of all the medicines, herbs, non-prescription drugs, or dietary supplements you use. Also tell them if you smoke, drink alcohol, or use illegal drugs. Some items may interact with your medicine. What should I watch for while using this medicine? Visit your prescriber or health care professional for regular checks on your progress and for the needed blood tests and blood pressure measurements. It is especially important for the doctor to make sure your hemoglobin level is in the desired range, to limit the risk of potential side effects and to give you the best benefit. Keep all appointments for any recommended tests. Check your blood pressure as directed. Ask your doctor what your blood pressure should be and when you should contact him or her. As your body makes more red blood cells, you may need to take iron, folic acid, or vitamin B supplements. Ask your doctor or health care provider which products are right for you. If you have kidney disease continue dietary restrictions, even though this medication can make you feel better. Talk with your doctor or health care professional about the   foods you eat and the vitamins that you take. What side effects may I notice from receiving this medicine? Side effects that you should report to your doctor or health care professional as soon as possible: -allergic reactions like skin rash, itching or hives, swelling of the face, lips, or tongue -breathing problems -changes in vision -chest pain -confusion, trouble speaking  or understanding -feeling faint or lightheaded, falls -high blood pressure -muscle aches or pains -pain, swelling, warmth in the leg -rapid weight gain -severe headaches -sudden numbness or weakness of the face, arm or leg -trouble walking, dizziness, loss of balance or coordination -seizures (convulsions) -swelling of the ankles, feet, hands -unusually weak or tired Side effects that usually do not require medical attention (report to your doctor or health care professional if they continue or are bothersome): -diarrhea -fever, chills (flu-like symptoms) -headaches -nausea, vomiting -redness, stinging, or swelling at site where injected This list may not describe all possible side effects. Call your doctor for medical advice about side effects. You may report side effects to FDA at 1-800-FDA-1088. Where should I keep my medicine? Keep out of the reach of children. Store in a refrigerator between 2 and 8 degrees C (36 and 46 degrees F). Do not freeze. Do not shake. Throw away any unused portion if using a single-dose vial. Throw away any unused medicine after the expiration date. NOTE: This sheet is a summary. It may not cover all possible information. If you have questions about this medicine, talk to your doctor, pharmacist, or health care provider.    2016, Elsevier/Gold Standard. (2007-12-05 10:23:57)  

## 2015-06-26 ENCOUNTER — Encounter: Payer: Self-pay | Admitting: Vascular Surgery

## 2015-06-26 NOTE — Progress Notes (Signed)
Results for NASER, FALARDEAU (MRN [id number]) as of 06/26/2015 07:09  Ref. Range 06/25/2015 09:15  Hemoglobin Latest Ref Range: 13.0-17.0 g/dL 9.8 (L)  HCT Latest Ref Range: 39.0-52.0 % 28.3 (L)  Results for DRAEGAN, CARRARA (MRN [id number]) as of 06/26/2015 07:09  Ref. Range 06/25/2015 09:10  Iron Latest Ref Range: 45-182 ug/dL 41 (L)  UIBC Latest Units: ug/dL 224  TIBC Latest Ref Range: 250-450 ug/dL 265  Saturation Ratios Latest Ref Range: 17.9-39.5 % 15 (L)  Ferritin Latest Ref Range: 24-336 ng/mL 329   Aranesp 100 mcg administered

## 2015-07-03 ENCOUNTER — Ambulatory Visit: Payer: Medicare Other | Admitting: Vascular Surgery

## 2015-07-03 ENCOUNTER — Other Ambulatory Visit (HOSPITAL_COMMUNITY): Payer: Medicare Other

## 2015-07-03 ENCOUNTER — Encounter (HOSPITAL_COMMUNITY): Payer: Medicare Other

## 2015-07-03 ENCOUNTER — Other Ambulatory Visit (HOSPITAL_COMMUNITY): Payer: Self-pay | Admitting: *Deleted

## 2015-07-04 ENCOUNTER — Encounter (HOSPITAL_COMMUNITY): Payer: Medicare Other | Attending: Nephrology

## 2015-07-09 ENCOUNTER — Ambulatory Visit (HOSPITAL_COMMUNITY): Payer: Medicare Other

## 2015-07-09 ENCOUNTER — Encounter (HOSPITAL_COMMUNITY)
Admission: RE | Admit: 2015-07-09 | Discharge: 2015-07-09 | Disposition: A | Discharge status: Home / Self Care | Payer: Medicare Other | Source: Ambulatory Visit | Attending: Nephrology | Admitting: Nephrology

## 2015-07-09 ENCOUNTER — Encounter (HOSPITAL_COMMUNITY): Payer: Medicare Other

## 2015-07-09 DIAGNOSIS — D638 Anemia in other chronic diseases classified elsewhere: Secondary | ICD-10-CM | POA: Insufficient documentation

## 2015-07-09 DIAGNOSIS — N184 Chronic kidney disease, stage 4 (severe): Secondary | ICD-10-CM | POA: Insufficient documentation

## 2015-07-09 LAB — HEMOGLOBIN AND HEMATOCRIT, BLOOD
HCT: 31 % — ABNORMAL LOW (ref 39.0–52.0)
Hemoglobin: 10.6 g/dL — ABNORMAL LOW (ref 13.0–17.0)

## 2015-07-09 MED ORDER — SODIUM CHLORIDE 0.9 % IV SOLN: 510.0000 mg | INTRAVENOUS | Status: DC

## 2015-07-09 MED ORDER — DARBEPOETIN ALFA 100 MCG/0.5ML IJ SOSY
PREFILLED_SYRINGE | INTRAMUSCULAR | Status: AC
  Filled 2015-07-09: qty 0.5

## 2015-07-09 MED ORDER — FERUMOXYTOL INJECTION 510 MG/17 ML
510.0000 mg | INTRAVENOUS | Status: DC
  Administered 2015-07-09: 510 mg via INTRAVENOUS
  Filled 2015-07-09: qty 17

## 2015-07-09 MED ORDER — DARBEPOETIN ALFA 100 MCG/0.5ML IJ SOSY
100.0000 ug | PREFILLED_SYRINGE | INTRAMUSCULAR | Status: DC
  Administered 2015-07-09: 100 ug via SUBCUTANEOUS

## 2015-07-09 NOTE — Progress Notes (Signed)
Dr. Phineas Semen office called to clarify whether Feraheme had been given and if we needed to give today. Ebony, RN stated that pt had not shown for his scheduled infusion so would get it today.

## 2015-07-09 NOTE — Progress Notes (Signed)
Results for Miguel Tucker, Miguel Tucker (MRN [id number]) as of 07/09/2015 11:20  Ref. Range 07/09/2015 10:49  Hemoglobin Latest Ref Range: 13.0-17.0 g/dL 10.6 (L)  HCT Latest Ref Range: 39.0-52.0 % 31.0 (L)   Aranesp 100 mcg SQ given along with Feraheme 510 mg IV.

## 2015-07-23 ENCOUNTER — Encounter (HOSPITAL_COMMUNITY)
Admission: RE | Admit: 2015-07-23 | Discharge: 2015-07-23 | Disposition: A | Discharge status: Home / Self Care | Payer: Medicare Other | Source: Ambulatory Visit | Attending: Nephrology | Admitting: Nephrology

## 2015-07-23 DIAGNOSIS — N184 Chronic kidney disease, stage 4 (severe): Secondary | ICD-10-CM | POA: Diagnosis not present

## 2015-07-23 LAB — HEMOGLOBIN AND HEMATOCRIT, BLOOD
HEMATOCRIT: 32.8 % — AB (ref 39.0–52.0)
HEMOGLOBIN: 11.1 g/dL — AB (ref 13.0–17.0)

## 2015-07-23 MED ORDER — DARBEPOETIN ALFA 100 MCG/0.5ML IJ SOSY
PREFILLED_SYRINGE | INTRAMUSCULAR | Status: AC
  Filled 2015-07-23: qty 0.5

## 2015-07-23 MED ORDER — DARBEPOETIN ALFA 100 MCG/0.5ML IJ SOSY
100.0000 ug | PREFILLED_SYRINGE | INTRAMUSCULAR | Status: DC
  Administered 2015-07-23: 100 ug via SUBCUTANEOUS

## 2015-07-24 NOTE — Progress Notes (Signed)
Results for ZAYAN, SEPPALA (MRN [id number]) as of 07/24/2015 10:43  Ref. Range 07/23/2015 09:43  Hemoglobin Latest Ref Range: 13.0-17.0 g/dL 11.1 (L)  HCT Latest Ref Range: 39.0-52.0 % 32.8 (L)

## 2015-08-06 ENCOUNTER — Other Ambulatory Visit (HOSPITAL_COMMUNITY): Payer: Medicare Other

## 2015-08-06 ENCOUNTER — Encounter (HOSPITAL_COMMUNITY)
Admission: RE | Admit: 2015-08-06 | Discharge: 2015-08-06 | Disposition: A | Discharge status: Home / Self Care | Payer: Medicare Other | Source: Ambulatory Visit | Attending: Nephrology | Admitting: Nephrology

## 2015-08-06 DIAGNOSIS — N184 Chronic kidney disease, stage 4 (severe): Secondary | ICD-10-CM | POA: Diagnosis not present

## 2015-08-06 DIAGNOSIS — D638 Anemia in other chronic diseases classified elsewhere: Secondary | ICD-10-CM | POA: Insufficient documentation

## 2015-08-06 LAB — HEMOGLOBIN AND HEMATOCRIT, BLOOD
HCT: 35.6 % — ABNORMAL LOW (ref 39.0–52.0)
HEMOGLOBIN: 12.2 g/dL — AB (ref 13.0–17.0)

## 2015-08-06 NOTE — Progress Notes (Signed)
Pt arrived for scheduled aranesp. hgb 12.2 therefore med held per order. Next appt 2 weeks.

## 2015-08-20 ENCOUNTER — Encounter (HOSPITAL_COMMUNITY): Admission: RE | Admit: 2015-08-20 | Payer: Medicare Other | Source: Ambulatory Visit

## 2015-08-20 ENCOUNTER — Encounter (HOSPITAL_COMMUNITY): Payer: Medicare Other

## 2015-08-21 ENCOUNTER — Encounter (HOSPITAL_COMMUNITY)
Admission: RE | Admit: 2015-08-21 | Discharge: 2015-08-21 | Disposition: A | Discharge status: Home / Self Care | Payer: Medicare Other | Source: Ambulatory Visit | Attending: Nephrology | Admitting: Nephrology

## 2015-08-21 ENCOUNTER — Encounter (HOSPITAL_COMMUNITY): Payer: Self-pay

## 2015-08-21 DIAGNOSIS — N184 Chronic kidney disease, stage 4 (severe): Secondary | ICD-10-CM | POA: Diagnosis not present

## 2015-08-21 LAB — HEMOGLOBIN AND HEMATOCRIT, BLOOD
HCT: 32.8 % — ABNORMAL LOW (ref 39.0–52.0)
Hemoglobin: 11.4 g/dL — ABNORMAL LOW (ref 13.0–17.0)

## 2015-08-21 MED ORDER — DARBEPOETIN ALFA 100 MCG/0.5ML IJ SOSY
100.0000 ug | PREFILLED_SYRINGE | INTRAMUSCULAR | Status: DC
  Administered 2015-08-21: 100 ug via SUBCUTANEOUS
  Filled 2015-08-21: qty 0.5

## 2015-08-21 MED ORDER — DARBEPOETIN ALFA 60 MCG/0.3ML IJ SOSY: 60.0000 ug | PREFILLED_SYRINGE | INTRAMUSCULAR | Status: DC

## 2015-08-21 NOTE — Progress Notes (Signed)
Results for Miguel Tucker, Miguel Tucker (MRN [id number]) as of 08/21/2015 15:58  Ref. Range 08/21/2015 13:00  Hemoglobin Latest Ref Range: 13.0 - 17.0 g/dL 11.4 (L)  HCT Latest Ref Range: 39.0 - 52.0 % 32.8 (L)

## 2015-08-25 ENCOUNTER — Encounter: Payer: Self-pay | Admitting: Vascular Surgery

## 2015-08-28 ENCOUNTER — Ambulatory Visit (HOSPITAL_COMMUNITY)
Admission: RE | Admit: 2015-08-28 | Discharge: 2015-08-28 | Disposition: A | Discharge status: Home / Self Care | Payer: Medicare Other | Source: Ambulatory Visit | Attending: Vascular Surgery | Admitting: Vascular Surgery

## 2015-08-28 ENCOUNTER — Ambulatory Visit (INDEPENDENT_AMBULATORY_CARE_PROVIDER_SITE_OTHER): Payer: Medicare Other | Admitting: Vascular Surgery

## 2015-08-28 ENCOUNTER — Encounter: Payer: Self-pay | Admitting: Vascular Surgery

## 2015-08-28 VITALS — BP 154/78 | HR 62 | Temp 97.6°F | Resp 16 | Ht 65.0 in | Wt 179.0 lb

## 2015-08-28 DIAGNOSIS — N184 Chronic kidney disease, stage 4 (severe): Secondary | ICD-10-CM | POA: Diagnosis not present

## 2015-08-28 DIAGNOSIS — Z01810 Encounter for preprocedural cardiovascular examination: Secondary | ICD-10-CM | POA: Diagnosis not present

## 2015-08-28 NOTE — Progress Notes (Signed)
Referring Physician: Precious Bard MD Patient name: Miguel Tucker MRN: [id number] DOB: 1930-02-10 Sex: male  REASON FOR CONSULT: hemodialysis access  HPI: Miguel Tucker is a 82 y.o. male,  referred for placement of hemodialysis access. He is right handed. He has had no prior access procedures. He is currently not on hemodialysis. Other medical problems include diabetes and hypertension. These are both currently stable.   Past Medical History:  Diagnosis Date  . Diabetes mellitus    type 2  . Hypertension   . Hypokalemia    Past Surgical History:  Procedure Laterality Date  . APPENDECTOMY    . OTHER SURGICAL HISTORY     testicular tumor- benign   . ROTATOR CUFF REPAIR    . WOUND DEBRIDEMENT  06/01/2011   Procedure: DEBRIDEMENT ABDOMINAL WOUND;  Surgeon: [name], MD;  Location: [facility];  Service: General;  Laterality: N/A;  Remove Sutures     No family history on file.  SOCIAL HISTORY: Social History   Social History  . Marital status: Married    Spouse name: N/A  . Number of children: N/A  . Years of education: N/A   Occupational History  . Not on file.   Social History Main Topics  . Smoking status: Former Smoker    Quit date: 11/06/1975  . Smokeless tobacco: Never Used  . Alcohol use No  . Drug use: No  . Sexual activity: Not on file   Other Topics Concern  . Not on file   Social History Narrative  . No narrative on file    Allergies  Allergen Reactions  . Penicillins Itching    On arms only.    Current Outpatient Prescriptions  Medication Sig Dispense Refill  . allopurinol (ZYLOPRIM) 100 MG tablet     . amLODipine (NORVASC) 5 MG tablet Take 5 mg by mouth every morning.     [name] aspirin EC 81 MG tablet Take 81 mg by mouth every morning.     . calcitRIOL (ROCALTROL) 0.25 MCG capsule     . furosemide (LASIX) 40 MG tablet     . labetalol (NORMODYNE) 200 MG tablet     . losartan-hydrochlorothiazide (HYZAAR) 100-25 MG per tablet Take 1  tablet by mouth every morning.     . montelukast (SINGULAIR) 10 MG tablet Take 10 mg by mouth every morning.     . Tamsulosin HCl (FLOMAX) 0.4 MG CAPS Take 0.4 mg by mouth daily.     [name] terazosin (HYTRIN) 5 MG capsule Take 5 mg by mouth every morning.      No current facility-administered medications for this visit.     ROS:   General:  No weight loss, Fever, chills  HEENT: No recent headaches, no nasal bleeding, no visual changes, no sore throat  Neurologic: No dizziness, blackouts, seizures. No recent symptoms of stroke or mini- stroke. No recent episodes of slurred speech, or temporary blindness.  Cardiac: No recent episodes of chest pain/pressure, no shortness of breath at rest.  No shortness of breath with exertion.  Denies history of atrial fibrillation or irregular heartbeat  Vascular: No history of rest pain in feet.  No history of claudication.  No history of non-healing ulcer, No history of DVT   Pulmonary: No home oxygen, no productive cough, no hemoptysis,  No asthma or wheezing  Musculoskeletal:  [ ]  Arthritis, [ ]  Low back pain,  [ ]  Joint pain  Hematologic:No history of hypercoagulable state.  No history of  easy bleeding.  No history of anemia  Gastrointestinal: No hematochezia or melena,  No gastroesophageal reflux, no trouble swallowing  Urinary: [X]  chronic Kidney disease, [ ]  on HD - [ ]  MWF or [ ]  TTHS, [ ]  Burning with urination, [ ]  Frequent urination, [ ]  Difficulty urinating;   Skin: No rashes  Psychological: No history of anxiety,  No history of depression   Physical Examination  Vitals:   08/28/15 1141 08/28/15 1147  BP: (!) 152/76 (!) 154/78  Pulse: 60 62  Resp: 16   Temp: 97.6 F (36.4 C)   TempSrc: Oral   SpO2: 97%   Weight: 179 lb (81.2 kg)   Height: 5\' 5"  (1.651 m)     Body mass index is 29.79 kg/m.  General:  Alert and oriented, no acute distress HEENT: Normal Neck: No bruit or JVD Pulmonary: Clear to auscultation  bilaterally Cardiac: Regular Rate and Rhythm without murmur Abdomen: Soft, non-tender, non-distended, no mass, no scars Skin: No rash Extremity Pulses:  2+ radial, brachial pulses bilaterally Musculoskeletal: No deformity or edema  Neurologic: Upper and lower extremity motor 5/5 and symmetric  DATA:  Patient had a vein mapping ultrasound today which I reviewed and interpreted. This showed greater than 3 mm basilic vein bilaterally. However the cephalic vein was small bilaterally. Rectal and radial arteries were normal diameter bilaterally.  ASSESSMENT:  Patient needs long-term hemodialysis access. Anatomy is best suited for a left basilic vein transposition fistula.   PLAN:  Risks benefits possible palpitations and procedure details discussed with the patient today including not limited to bleeding infection non-maturation of the fistula (10-15%). Patient scheduled for left basilic vein transposition fistula be placed on 09/09/2015.   Ruta Hinds, MD [facility] Office: [phone] Pager: [phone]

## 2015-09-02 ENCOUNTER — Other Ambulatory Visit: Payer: Self-pay | Admitting: *Deleted

## 2015-09-04 ENCOUNTER — Encounter (HOSPITAL_COMMUNITY)
Admission: RE | Admit: 2015-09-04 | Discharge: 2015-09-04 | Disposition: A | Discharge status: Home / Self Care | Payer: Medicare Other | Source: Ambulatory Visit | Attending: Nephrology | Admitting: Nephrology

## 2015-09-04 DIAGNOSIS — N184 Chronic kidney disease, stage 4 (severe): Secondary | ICD-10-CM | POA: Diagnosis not present

## 2015-09-04 LAB — IRON AND TIBC
IRON: 51 ug/dL (ref 45–182)
Saturation Ratios: 22 % (ref 17.9–39.5)
TIBC: 234 ug/dL — AB (ref 250–450)
UIBC: 183 ug/dL

## 2015-09-04 LAB — FERRITIN: FERRITIN: 328 ng/mL (ref 24–336)

## 2015-09-04 LAB — HEMOGLOBIN AND HEMATOCRIT, BLOOD
HCT: 35.3 % — ABNORMAL LOW (ref 39.0–52.0)
HEMOGLOBIN: 12.1 g/dL — AB (ref 13.0–17.0)

## 2015-09-05 ENCOUNTER — Inpatient Hospital Stay (HOSPITAL_COMMUNITY)
Admission: RE | Admit: 2015-09-05 | Discharge: 2015-09-05 | Disposition: A | Discharge status: Home / Self Care | Payer: Medicare Other | Source: Ambulatory Visit

## 2015-09-05 ENCOUNTER — Encounter (HOSPITAL_COMMUNITY): Payer: Self-pay

## 2015-09-05 HISTORY — DX: Gout, unspecified: M10.9

## 2015-09-05 HISTORY — DX: Unspecified asthma, uncomplicated: J45.909

## 2015-09-05 HISTORY — DX: Unspecified hearing loss, unspecified ear: H91.90

## 2015-09-05 HISTORY — DX: Chronic kidney disease, unspecified: N18.9

## 2015-09-05 NOTE — Progress Notes (Addendum)
Patient did not show for pre-admission appointment.  Pre-op phone call completed.  Patient denies chest pain, shortness of breath, and cardiac history.  Nurse informed patient to arrive to [facility] on Tuesday, September 5th, at 7:15  Patient will need EKG, ISTAT 4, and PCR screening DOS.    Patient states that he checks his blood sugar twice daily and that his fasting glucose is in the 150's.

## 2015-09-09 ENCOUNTER — Encounter (HOSPITAL_COMMUNITY)
Admission: RE | Disposition: A | Discharge status: Home / Self Care | Payer: Self-pay | Source: Ambulatory Visit | Attending: Vascular Surgery

## 2015-09-09 ENCOUNTER — Ambulatory Visit (HOSPITAL_COMMUNITY): Payer: Medicare Other | Admitting: Certified Registered Nurse Anesthetist

## 2015-09-09 ENCOUNTER — Ambulatory Visit (HOSPITAL_COMMUNITY)
Admission: RE | Admit: 2015-09-09 | Discharge: 2015-09-09 | Disposition: A | Discharge status: Home / Self Care | Payer: Medicare Other | Source: Ambulatory Visit | Attending: Vascular Surgery | Admitting: Vascular Surgery

## 2015-09-09 ENCOUNTER — Encounter (HOSPITAL_COMMUNITY): Payer: Self-pay | Admitting: Anesthesiology

## 2015-09-09 ENCOUNTER — Other Ambulatory Visit: Payer: Self-pay

## 2015-09-09 ENCOUNTER — Ambulatory Visit (HOSPITAL_COMMUNITY): Payer: Medicare Other | Admitting: Vascular Surgery

## 2015-09-09 DIAGNOSIS — Z7982 Long term (current) use of aspirin: Secondary | ICD-10-CM | POA: Diagnosis not present

## 2015-09-09 DIAGNOSIS — Z87891 Personal history of nicotine dependence: Secondary | ICD-10-CM | POA: Diagnosis not present

## 2015-09-09 DIAGNOSIS — I120 Hypertensive chronic kidney disease with stage 5 chronic kidney disease or end stage renal disease: Secondary | ICD-10-CM | POA: Diagnosis present

## 2015-09-09 DIAGNOSIS — E1122 Type 2 diabetes mellitus with diabetic chronic kidney disease: Secondary | ICD-10-CM | POA: Diagnosis not present

## 2015-09-09 DIAGNOSIS — Z880 Allergy status to penicillin: Secondary | ICD-10-CM | POA: Insufficient documentation

## 2015-09-09 DIAGNOSIS — N186 End stage renal disease: Secondary | ICD-10-CM

## 2015-09-09 DIAGNOSIS — N185 Chronic kidney disease, stage 5: Secondary | ICD-10-CM | POA: Diagnosis not present

## 2015-09-09 DIAGNOSIS — Z48812 Encounter for surgical aftercare following surgery on the circulatory system: Secondary | ICD-10-CM

## 2015-09-09 HISTORY — PX: BASCILIC VEIN TRANSPOSITION: SHX5742

## 2015-09-09 LAB — GLUCOSE, CAPILLARY: Glucose-Capillary: 122 mg/dL — ABNORMAL HIGH (ref 65–99)

## 2015-09-09 LAB — POCT I-STAT 4, (NA,K, GLUC, HGB,HCT)
Glucose, Bld: 104 mg/dL — ABNORMAL HIGH (ref 65–99)
HCT: 36 % — ABNORMAL LOW (ref 39.0–52.0)
Hemoglobin: 12.2 g/dL — ABNORMAL LOW (ref 13.0–17.0)
Potassium: 3.4 mmol/L — ABNORMAL LOW (ref 3.5–5.1)
Sodium: 144 mmol/L (ref 135–145)

## 2015-09-09 SURGERY — TRANSPOSITION, VEIN, BASILIC
Anesthesia: Monitor Anesthesia Care | Site: Arm Upper | Laterality: Left

## 2015-09-09 MED ORDER — CHLORHEXIDINE GLUCONATE CLOTH 2 % EX PADS: 6.0000 | MEDICATED_PAD | Freq: Once | CUTANEOUS | Status: DC

## 2015-09-09 MED ORDER — LIDOCAINE HCL (PF) 1 % IJ SOLN
INTRAMUSCULAR | Status: AC
  Filled 2015-09-09: qty 30

## 2015-09-09 MED ORDER — PROPOFOL 10 MG/ML IV BOLUS
INTRAVENOUS | Status: AC
  Filled 2015-09-09: qty 20

## 2015-09-09 MED ORDER — ACETAMINOPHEN 160 MG/5ML PO SUSP: 325.0000 mg | ORAL | Status: DC | PRN

## 2015-09-09 MED ORDER — LIDOCAINE 2% (20 MG/ML) 5 ML SYRINGE
INTRAMUSCULAR | Status: AC
  Filled 2015-09-09: qty 5

## 2015-09-09 MED ORDER — FENTANYL CITRATE (PF) 100 MCG/2ML IJ SOLN
INTRAMUSCULAR | Status: AC
  Filled 2015-09-09: qty 2

## 2015-09-09 MED ORDER — ACETAMINOPHEN 325 MG PO TABS: 325.0000 mg | ORAL_TABLET | ORAL | Status: DC | PRN

## 2015-09-09 MED ORDER — EPHEDRINE SULFATE 50 MG/ML IJ SOLN
INTRAMUSCULAR | Status: DC | PRN
  Administered 2015-09-09: 10 mg via INTRAVENOUS
  Administered 2015-09-09 (×2): 5 mg via INTRAVENOUS
  Administered 2015-09-09: 10 mg via INTRAVENOUS

## 2015-09-09 MED ORDER — ONDANSETRON HCL 4 MG/2ML IJ SOLN
INTRAMUSCULAR | Status: DC | PRN
  Administered 2015-09-09: 4 mg via INTRAVENOUS

## 2015-09-09 MED ORDER — VANCOMYCIN HCL IN DEXTROSE 1-5 GM/200ML-% IV SOLN
INTRAVENOUS | Status: AC
  Filled 2015-09-09: qty 200

## 2015-09-09 MED ORDER — FENTANYL CITRATE (PF) 100 MCG/2ML IJ SOLN
INTRAMUSCULAR | Status: DC | PRN
  Administered 2015-09-09: 25 ug via INTRAVENOUS
  Administered 2015-09-09: 50 ug via INTRAVENOUS
  Administered 2015-09-09: 25 ug via INTRAVENOUS

## 2015-09-09 MED ORDER — 0.9 % SODIUM CHLORIDE (POUR BTL) OPTIME
TOPICAL | Status: DC | PRN
  Administered 2015-09-09: 1000 mL

## 2015-09-09 MED ORDER — PHENYLEPHRINE 40 MCG/ML (10ML) SYRINGE FOR IV PUSH (FOR BLOOD PRESSURE SUPPORT)
PREFILLED_SYRINGE | INTRAVENOUS | Status: AC
  Filled 2015-09-09: qty 10

## 2015-09-09 MED ORDER — FENTANYL CITRATE (PF) 100 MCG/2ML IJ SOLN: 25.0000 ug | INTRAMUSCULAR | Status: DC | PRN

## 2015-09-09 MED ORDER — SODIUM CHLORIDE 0.9 % IV SOLN
INTRAVENOUS | Status: DC | PRN
  Administered 2015-09-09: 10:00:00

## 2015-09-09 MED ORDER — PROPOFOL 10 MG/ML IV BOLUS
INTRAVENOUS | Status: DC | PRN
  Administered 2015-09-09: 200 mg via INTRAVENOUS

## 2015-09-09 MED ORDER — PHENYLEPHRINE HCL 10 MG/ML IJ SOLN
INTRAMUSCULAR | Status: DC | PRN
  Administered 2015-09-09 (×2): 40 ug via INTRAVENOUS

## 2015-09-09 MED ORDER — VANCOMYCIN HCL IN DEXTROSE 1-5 GM/200ML-% IV SOLN
1000.0000 mg | INTRAVENOUS | Status: AC
  Administered 2015-09-09: 1000 mg via INTRAVENOUS

## 2015-09-09 MED ORDER — LIDOCAINE 2% (20 MG/ML) 5 ML SYRINGE
INTRAMUSCULAR | Status: DC | PRN
  Administered 2015-09-09: 60 mg via INTRAVENOUS

## 2015-09-09 MED ORDER — EPHEDRINE 5 MG/ML INJ
INTRAVENOUS | Status: AC
  Filled 2015-09-09: qty 10

## 2015-09-09 MED ORDER — OXYCODONE HCL 5 MG PO TABS: 5.0000 mg | ORAL_TABLET | Freq: Once | ORAL | Status: DC | PRN

## 2015-09-09 MED ORDER — OXYCODONE HCL 5 MG/5ML PO SOLN: 5.0000 mg | Freq: Once | ORAL | Status: DC | PRN

## 2015-09-09 MED ORDER — HEPARIN SODIUM (PORCINE) 1000 UNIT/ML IJ SOLN
INTRAMUSCULAR | Status: AC
  Filled 2015-09-09: qty 1

## 2015-09-09 MED ORDER — OXYCODONE-ACETAMINOPHEN 5-325 MG PO TABS: 1.0000 | ORAL_TABLET | Freq: Four times a day (QID) | ORAL | 0 refills | Status: DC | PRN

## 2015-09-09 MED ORDER — SODIUM CHLORIDE 0.9 % IV SOLN
INTRAVENOUS | Status: DC
  Administered 2015-09-09 (×2): via INTRAVENOUS

## 2015-09-09 MED ORDER — MIDAZOLAM HCL 2 MG/2ML IJ SOLN
INTRAMUSCULAR | Status: AC
  Filled 2015-09-09: qty 2

## 2015-09-09 MED ORDER — HEPARIN SODIUM (PORCINE) 1000 UNIT/ML IJ SOLN
INTRAMUSCULAR | Status: DC | PRN
  Administered 2015-09-09: 3 mL via INTRAVENOUS

## 2015-09-09 SURGICAL SUPPLY — 33 items
AGENT HMST SPONGE THK3/8 (HEMOSTASIS)
ARMBAND PINK RESTRICT EXTREMIT (MISCELLANEOUS) ×2 IMPLANT
CANISTER SUCTION 2500CC (MISCELLANEOUS) ×2 IMPLANT
CANNULA VESSEL 3MM 2 BLNT TIP (CANNULA) IMPLANT
CLIP TI MEDIUM 24 (CLIP) ×2 IMPLANT
CLIP TI WIDE RED SMALL 24 (CLIP) ×2 IMPLANT
COVER PROBE W GEL 5X96 (DRAPES) ×2 IMPLANT
DECANTER SPIKE VIAL GLASS SM (MISCELLANEOUS) ×2 IMPLANT
ELECT REM PT RETURN 9FT ADLT (ELECTROSURGICAL) ×2
ELECTRODE REM PT RTRN 9FT ADLT (ELECTROSURGICAL) ×1 IMPLANT
GEL ULTRASOUND 20GR AQUASONIC (MISCELLANEOUS) IMPLANT
GLOVE BIO SURGEON STRL SZ 6.5 (GLOVE) ×2 IMPLANT
GLOVE BIO SURGEON STRL SZ7 (GLOVE) ×2 IMPLANT
GLOVE BIO SURGEON STRL SZ7.5 (GLOVE) ×2 IMPLANT
GOWN STRL REUS W/ TWL LRG LVL3 (GOWN DISPOSABLE) ×3 IMPLANT
GOWN STRL REUS W/TWL LRG LVL3 (GOWN DISPOSABLE) ×6
HEMOSTAT SPONGE AVITENE ULTRA (HEMOSTASIS) IMPLANT
KIT BASIN OR (CUSTOM PROCEDURE TRAY) ×2 IMPLANT
KIT ROOM TURNOVER OR (KITS) ×2 IMPLANT
LIQUID BAND (GAUZE/BANDAGES/DRESSINGS) ×2 IMPLANT
LOOP VESSEL MINI RED (MISCELLANEOUS) IMPLANT
NS IRRIG 1000ML POUR BTL (IV SOLUTION) ×2 IMPLANT
PACK CV ACCESS (CUSTOM PROCEDURE TRAY) ×2 IMPLANT
PAD ARMBOARD 7.5X6 YLW CONV (MISCELLANEOUS) ×4 IMPLANT
SUT PROLENE 7 0 BV 1 (SUTURE) ×3 IMPLANT
SUT SILK 2 0 SH (SUTURE) IMPLANT
SUT SILK 3 0 (SUTURE) ×2
SUT SILK 3-0 18XBRD TIE 12 (SUTURE) IMPLANT
SUT VIC AB 3-0 SH 27 (SUTURE) ×2
SUT VIC AB 3-0 SH 27X BRD (SUTURE) ×1 IMPLANT
SUT VICRYL 4-0 PS2 18IN ABS (SUTURE) ×2 IMPLANT
UNDERPAD 30X30 (UNDERPADS AND DIAPERS) ×2 IMPLANT
WATER STERILE IRR 1000ML POUR (IV SOLUTION) ×2 IMPLANT

## 2015-09-09 NOTE — H&P (View-Only) (Signed)
Referring Physician: Precious Bard MD Patient name: Miguel Tucker MRN: [id number] DOB: January 22, 1930 Sex: male  REASON FOR CONSULT: hemodialysis access  HPI: Miguel Tucker is a 82 y.o. male,  referred for placement of hemodialysis access. He is right handed. He has had no prior access procedures. He is currently not on hemodialysis. Other medical problems include diabetes and hypertension. These are both currently stable.   Past Medical History:  Diagnosis Date  . Diabetes mellitus    type 2  . Hypertension   . Hypokalemia    Past Surgical History:  Procedure Laterality Date  . APPENDECTOMY    . OTHER SURGICAL HISTORY     testicular tumor- benign   . ROTATOR CUFF REPAIR    . WOUND DEBRIDEMENT  06/01/2011   Procedure: DEBRIDEMENT ABDOMINAL WOUND;  Surgeon: [name], MD;  Location: [facility];  Service: General;  Laterality: N/A;  Remove Sutures     No family history on file.  SOCIAL HISTORY: Social History   Social History  . Marital status: Married    Spouse name: N/A  . Number of children: N/A  . Years of education: N/A   Occupational History  . Not on file.   Social History Main Topics  . Smoking status: Former Smoker    Quit date: 11/06/1975  . Smokeless tobacco: Never Used  . Alcohol use No  . Drug use: No  . Sexual activity: Not on file   Other Topics Concern  . Not on file   Social History Narrative  . No narrative on file    Allergies  Allergen Reactions  . Penicillins Itching    On arms only.    Current Outpatient Prescriptions  Medication Sig Dispense Refill  . allopurinol (ZYLOPRIM) 100 MG tablet     . amLODipine (NORVASC) 5 MG tablet Take 5 mg by mouth every morning.     [name] aspirin EC 81 MG tablet Take 81 mg by mouth every morning.     . calcitRIOL (ROCALTROL) 0.25 MCG capsule     . furosemide (LASIX) 40 MG tablet     . labetalol (NORMODYNE) 200 MG tablet     . losartan-hydrochlorothiazide (HYZAAR) 100-25 MG per tablet Take 1  tablet by mouth every morning.     . montelukast (SINGULAIR) 10 MG tablet Take 10 mg by mouth every morning.     . Tamsulosin HCl (FLOMAX) 0.4 MG CAPS Take 0.4 mg by mouth daily.     [name] terazosin (HYTRIN) 5 MG capsule Take 5 mg by mouth every morning.      No current facility-administered medications for this visit.     ROS:   General:  No weight loss, Fever, chills  HEENT: No recent headaches, no nasal bleeding, no visual changes, no sore throat  Neurologic: No dizziness, blackouts, seizures. No recent symptoms of stroke or mini- stroke. No recent episodes of slurred speech, or temporary blindness.  Cardiac: No recent episodes of chest pain/pressure, no shortness of breath at rest.  No shortness of breath with exertion.  Denies history of atrial fibrillation or irregular heartbeat  Vascular: No history of rest pain in feet.  No history of claudication.  No history of non-healing ulcer, No history of DVT   Pulmonary: No home oxygen, no productive cough, no hemoptysis,  No asthma or wheezing  Musculoskeletal:  [ ]  Arthritis, [ ]  Low back pain,  [ ]  Joint pain  Hematologic:No history of hypercoagulable state.  No history of  easy bleeding.  No history of anemia  Gastrointestinal: No hematochezia or melena,  No gastroesophageal reflux, no trouble swallowing  Urinary: [X]  chronic Kidney disease, [ ]  on HD - [ ]  MWF or [ ]  TTHS, [ ]  Burning with urination, [ ]  Frequent urination, [ ]  Difficulty urinating;   Skin: No rashes  Psychological: No history of anxiety,  No history of depression   Physical Examination  Vitals:   08/28/15 1141 08/28/15 1147  BP: (!) 152/76 (!) 154/78  Pulse: 60 62  Resp: 16   Temp: 97.6 F (36.4 C)   TempSrc: Oral   SpO2: 97%   Weight: 179 lb (81.2 kg)   Height: 5\' 5"  (1.651 m)     Body mass index is 29.79 kg/m.  General:  Alert and oriented, no acute distress HEENT: Normal Neck: No bruit or JVD Pulmonary: Clear to auscultation  bilaterally Cardiac: Regular Rate and Rhythm without murmur Abdomen: Soft, non-tender, non-distended, no mass, no scars Skin: No rash Extremity Pulses:  2+ radial, brachial pulses bilaterally Musculoskeletal: No deformity or edema  Neurologic: Upper and lower extremity motor 5/5 and symmetric  DATA:  Patient had a vein mapping ultrasound today which I reviewed and interpreted. This showed greater than 3 mm basilic vein bilaterally. However the cephalic vein was small bilaterally. Rectal and radial arteries were normal diameter bilaterally.  ASSESSMENT:  Patient needs long-term hemodialysis access. Anatomy is best suited for a left basilic vein transposition fistula.   PLAN:  Risks benefits possible palpitations and procedure details discussed with the patient today including not limited to bleeding infection non-maturation of the fistula (10-15%). Patient scheduled for left basilic vein transposition fistula be placed on 09/09/2015.   Ruta Hinds, MD [facility] Office: [phone] Pager: [phone]

## 2015-09-09 NOTE — Discharge Instructions (Signed)
° ° °  09/09/2015 Kyvin Cauldwell Kowalke [id number] [birth year]  Surgeon(s): Elam Dutch, MD  Procedure(s): FIRST STAGE BRACHIAL VEIN TRANSPOSITION LEFT ARM  x Do not stick fistula for 12 weeks

## 2015-09-09 NOTE — Op Note (Signed)
Procedure: Left brachial vein fistula first stage, Ultrasound guidance  Preoperative diagnosis: End-stage renal disease  Postoperative diagnosis: Same  Anesthesia: Gen.  Assistant: Leontine Locket, PAC  Operative findings: 3 mm left brachial vein,3 mm left brachial artery  Operative details: After obtaining informed consent, the patient was taken to the operating room. The patient was placed in supine position operating table. After induction of general anesthesia, the patient's entire left upper extremity was prepped and draped in the usual sterile fashion. Next ultrasound was used to identify the left basilic vein. A longitudinal incision was made just below and lateral to the antecubital crease in order to expose the basilic vein. The vein was small 1.5 mm in diameter.  Care was taken to try to not injure any sensory nerves and all the motor nerves were identified and protected. I follow this up the arm for several centimeters making one additional incision but it remained small.  The adjacent brachial vein was dissected out and found to be of better quality about 3 mm diameter but fairly thin walled.  The vein was dissected free circumferentially and small side branches ligated and divided between silk ties or clips. Next the brachial artery was exposed through an antecubital incision.  The artery was approximately 3 mm in diameter with some spasm. This was dissected free circumferentially. Vessel loops were placed around it. There was some spasm within the artery after dissecting it free. Next the distal brachial vein was ligated with a 2 silk tie and the vein transected.  The patient was given 3000 units of intravenous heparin. Vessel loops were used to control the artery proximally and distally. A longitudinal arteriotomy was made with an 11 blade.  The vein was cut to length and sewn end of vein to side of artery using a running 7-0 Prolene suture. Just prior to completion of the anastomosis, it  was forebled backbled and thoroughly flushed. The anastomosis was secured and the Vesseloops were released.  There was a palpable thrill in the proximal aspect of the fistula. There was also good Doppler flow throughout the course of the fistula. At this point all subcutaneous tissues were reapproximated using running 3-0 Vicryl suture. All skin incisions were closed with running 4  0 Vicryl subcuticular stitch. Dermabond was applied to all incisions. The patient tolerated the procedure well and there were no complications. Instrument sponge needle counts were correct at the end of the case. The patient was taken to the recovery room in stable condition.  The patient had a palpable radial pulse at the end of the case.  Ruta Hinds, MD [facility] Office: [phone] Pager: [phone]

## 2015-09-09 NOTE — Interval H&P Note (Signed)
History and Physical Interval Note:  09/09/2015 10:32 AM  Miguel Tucker  has presented today for surgery, with the diagnosis of Chronic kidney disease  The various methods of treatment have been discussed with the patient and family. After consideration of risks, benefits and other options for treatment, the patient has consented to  Procedure(s): [name] (Left) as a surgical intervention .  The patient's history has been reviewed, patient examined, no change in status, stable for surgery.  I have reviewed the patient's chart and labs.  Questions were answered to the patient's satisfaction.     Ruta Hinds

## 2015-09-09 NOTE — Anesthesia Preprocedure Evaluation (Signed)
Anesthesia Evaluation  Patient identified by MRN, date of birth, ID band Patient awake    Reviewed: Allergy & Precautions, NPO status , Patient's Chart, lab work & pertinent test results  History of Anesthesia Complications Negative for: history of anesthetic complications  Airway Mallampati: II  TM Distance: >3 FB Neck ROM: Full    Dental  (+) Partial Lower, Partial Upper   Pulmonary neg shortness of breath, neg sleep apnea, neg COPD, neg recent URI, former smoker,    breath sounds clear to auscultation       Cardiovascular hypertension, Pt. on medications and Pt. on home beta blockers  Rhythm:Regular     Neuro/Psych negative neurological ROS  negative psych ROS   GI/Hepatic negative GI ROS, Neg liver ROS,   Endo/Other  diabetes, Type 2  Renal/GU CRFRenal disease     Musculoskeletal   Abdominal   Peds  Hematology  (+) anemia ,   Anesthesia Other Findings   Reproductive/Obstetrics                             Anesthesia Physical Anesthesia Plan  ASA: III  Anesthesia Plan: General and MAC   Post-op Pain Management:    Induction: Intravenous  Airway Management Planned: LMA, Natural Airway, Nasal Cannula and Simple Face Mask  Additional Equipment: None  Intra-op Plan:   Post-operative Plan: Extubation in OR  Informed Consent: I have reviewed the patients History and Physical, chart, labs and discussed the procedure including the risks, benefits and alternatives for the proposed anesthesia with the patient or authorized representative who has indicated his/her understanding and acceptance.   Dental advisory given  Plan Discussed with: CRNA and Surgeon  Anesthesia Plan Comments:         Anesthesia Quick Evaluation

## 2015-09-09 NOTE — Transfer of Care (Signed)
Immediate Anesthesia Transfer of Care Note  Patient: Miguel Tucker  Procedure(s) Performed: Procedure(s): FIRST STAGE BRACHIAL VEIN TRANSPOSITION LEFT ARM (Left)  Patient Location: PACU  Anesthesia Type:General  Level of Consciousness: awake, patient cooperative and responds to stimulation  Airway & Oxygen Therapy: Patient Spontanous Breathing and Patient connected to nasal cannula oxygen  Post-op Assessment: Report given to RN and Post -op Vital signs reviewed and stable  Post vital signs: Reviewed and stable  Last Vitals:  Vitals:   09/09/15 0807  BP: (!) 158/78  Pulse: 64  Resp: 18  Temp: 37.1 C    Last Pain:  Vitals:   09/09/15 0807  TempSrc: Oral         Complications: No apparent anesthesia complications

## 2015-09-10 ENCOUNTER — Encounter (HOSPITAL_COMMUNITY): Payer: Self-pay | Admitting: Vascular Surgery

## 2015-09-12 NOTE — Anesthesia Postprocedure Evaluation (Signed)
Anesthesia Post Note  Patient: Bettie Swavely Stavely  Procedure(s) Performed: Procedure(s) (LRB): FIRST STAGE BRACHIAL VEIN TRANSPOSITION LEFT ARM (Left)  Patient location during evaluation: PACU Anesthesia Type: General Level of consciousness: awake Pain management: pain level controlled Vital Signs Assessment: post-procedure vital signs reviewed and stable Respiratory status: spontaneous breathing Cardiovascular status: stable Postop Assessment: no signs of nausea or vomiting Anesthetic complications: no    Last Vitals:  Vitals:   09/09/15 1300 09/09/15 1320  BP: (!) 144/70 (!) 144/86  Pulse: 64 76  Resp: 13 16  Temp: 36.7 C     Last Pain:  Vitals:   09/09/15 1320  TempSrc:   PainSc: 0-No pain                 Jayleon Mcfarlane

## 2015-09-17 ENCOUNTER — Other Ambulatory Visit (HOSPITAL_COMMUNITY): Payer: Self-pay | Admitting: *Deleted

## 2015-09-18 ENCOUNTER — Encounter (HOSPITAL_COMMUNITY): Payer: Self-pay

## 2015-09-18 ENCOUNTER — Encounter (HOSPITAL_COMMUNITY)
Admission: RE | Admit: 2015-09-18 | Discharge: 2015-09-18 | Disposition: A | Discharge status: Home / Self Care | Payer: Medicare Other | Source: Ambulatory Visit | Attending: Nephrology | Admitting: Nephrology

## 2015-09-18 DIAGNOSIS — D638 Anemia in other chronic diseases classified elsewhere: Secondary | ICD-10-CM | POA: Insufficient documentation

## 2015-09-18 DIAGNOSIS — N184 Chronic kidney disease, stage 4 (severe): Secondary | ICD-10-CM | POA: Diagnosis not present

## 2015-09-18 LAB — HEMOGLOBIN AND HEMATOCRIT, BLOOD
HEMATOCRIT: 34.9 % — AB (ref 39.0–52.0)
Hemoglobin: 11.9 g/dL — ABNORMAL LOW (ref 13.0–17.0)

## 2015-09-18 MED ORDER — DARBEPOETIN ALFA 100 MCG/0.5ML IJ SOSY
100.0000 ug | PREFILLED_SYRINGE | Freq: Once | INTRAMUSCULAR | Status: AC
  Administered 2015-09-18: 100 ug via SUBCUTANEOUS

## 2015-09-18 MED ORDER — DARBEPOETIN ALFA 100 MCG/0.5ML IJ SOSY
PREFILLED_SYRINGE | INTRAMUSCULAR | Status: AC
  Filled 2015-09-18: qty 0.5

## 2015-09-18 NOTE — Progress Notes (Signed)
Results for ELSWORTH, LEDIN (MRN [id number]) as of 09/18/2015 13:51  Ref. Range 09/18/2015 10:37  Hemoglobin Latest Ref Range: 13.0 - 17.0 g/dL 11.9 (L)  HCT Latest Ref Range: 39.0 - 52.0 % 34.9 (L)

## 2015-10-02 ENCOUNTER — Encounter (HOSPITAL_COMMUNITY)
Admission: RE | Admit: 2015-10-02 | Discharge: 2015-10-02 | Disposition: A | Discharge status: Home / Self Care | Payer: Medicare Other | Source: Ambulatory Visit | Attending: Nephrology | Admitting: Nephrology

## 2015-10-02 DIAGNOSIS — N184 Chronic kidney disease, stage 4 (severe): Secondary | ICD-10-CM | POA: Diagnosis not present

## 2015-10-02 LAB — HEMOGLOBIN AND HEMATOCRIT, BLOOD
HEMATOCRIT: 37.3 % — AB (ref 39.0–52.0)
HEMOGLOBIN: 12.5 g/dL — AB (ref 13.0–17.0)

## 2015-10-02 MED ORDER — DARBEPOETIN ALFA 100 MCG/0.5ML IJ SOSY: 100.0000 ug | PREFILLED_SYRINGE | INTRAMUSCULAR | Status: DC

## 2015-10-02 NOTE — Progress Notes (Signed)
hgb 12.5 therefore med held due to order. To return in 2 weeks for regular appointment.

## 2015-10-14 ENCOUNTER — Encounter: Payer: Self-pay | Admitting: Vascular Surgery

## 2015-10-16 ENCOUNTER — Encounter (HOSPITAL_COMMUNITY)
Admission: RE | Admit: 2015-10-16 | Discharge: 2015-10-16 | Disposition: A | Discharge status: Home / Self Care | Payer: Medicare Other | Source: Ambulatory Visit | Attending: Nephrology | Admitting: Nephrology

## 2015-10-16 ENCOUNTER — Encounter: Payer: Self-pay | Admitting: Vascular Surgery

## 2015-10-16 ENCOUNTER — Ambulatory Visit (INDEPENDENT_AMBULATORY_CARE_PROVIDER_SITE_OTHER): Payer: Medicare Other | Admitting: Vascular Surgery

## 2015-10-16 ENCOUNTER — Other Ambulatory Visit: Payer: Self-pay

## 2015-10-16 ENCOUNTER — Ambulatory Visit (HOSPITAL_COMMUNITY)
Admission: RE | Admit: 2015-10-16 | Discharge: 2015-10-16 | Disposition: A | Discharge status: Home / Self Care | Payer: Medicare Other | Source: Ambulatory Visit | Attending: Vascular Surgery | Admitting: Vascular Surgery

## 2015-10-16 VITALS — BP 121/72 | HR 66 | Temp 97.4°F | Resp 16 | Ht 65.0 in | Wt 171.0 lb

## 2015-10-16 DIAGNOSIS — Z48812 Encounter for surgical aftercare following surgery on the circulatory system: Secondary | ICD-10-CM

## 2015-10-16 DIAGNOSIS — N186 End stage renal disease: Secondary | ICD-10-CM

## 2015-10-16 DIAGNOSIS — N184 Chronic kidney disease, stage 4 (severe): Secondary | ICD-10-CM | POA: Diagnosis present

## 2015-10-16 DIAGNOSIS — D638 Anemia in other chronic diseases classified elsewhere: Secondary | ICD-10-CM | POA: Insufficient documentation

## 2015-10-16 LAB — IRON AND TIBC
IRON: 80 ug/dL (ref 45–182)
Saturation Ratios: 32 % (ref 17.9–39.5)
TIBC: 249 ug/dL — AB (ref 250–450)
UIBC: 169 ug/dL

## 2015-10-16 LAB — HEMOGLOBIN AND HEMATOCRIT, BLOOD
HCT: 36.2 % — ABNORMAL LOW (ref 39.0–52.0)
Hemoglobin: 12.2 g/dL — ABNORMAL LOW (ref 13.0–17.0)

## 2015-10-16 LAB — FERRITIN: FERRITIN: 477 ng/mL — AB (ref 24–336)

## 2015-10-16 NOTE — Progress Notes (Signed)
Patient is an 82 year old male who returns for postoperative follow-up today. He had a first stage basilic vein transposition fistula on 09/09/2015. He currently is not on hemodialysis. He denies any numbness or tingling in his hand. He denies any incisional drainage.  Past Medical History:  Diagnosis Date  . Asthma   . Chronic kidney disease   . Diabetes mellitus    type 2  . Gout   . Hard of hearing   . Hypertension   . Hypokalemia     Past Surgical History:  Procedure Laterality Date  . APPENDECTOMY    . BASCILIC VEIN TRANSPOSITION Left 09/09/2015   Procedure: FIRST STAGE BRACHIAL VEIN TRANSPOSITION LEFT ARM;  Surgeon: Elam Dutch, MD;  Location: [facility];  Service: Vascular;  Laterality: Left;  . OTHER SURGICAL HISTORY     testicular tumor- benign   . ROTATOR CUFF REPAIR Right   . WOUND DEBRIDEMENT  06/01/2011   Procedure: DEBRIDEMENT ABDOMINAL WOUND;  Surgeon: [name], MD;  Location: [facility];  Service: General;  Laterality: N/A;  Remove Sutures      Physical exam:  Vitals:   10/16/15 0956  BP: 121/72  Pulse: 66  Resp: 16  Temp: 97.4 F (36.3 C)  TempSrc: Oral  SpO2: 98%  Weight: 171 lb (77.6 kg)  Height: 5\' 5"  (1.651 m)   Left upper extremity: Palpable thrill in fistula left hand warm  Data: Patient had a duplex ultrasound of his AV fistula which showed the fistula diameter is 6-7 mm.  Assessment: Maturing left basilic vein fistula.  Plan: Second stage basilic vein transposition November 13th 2017. Risks benefits possible complications and procedure details were discussed patient today including not limited to bleeding infection non-maturation of the fistula. He understands and agrees to proceed.  Ruta Hinds, MD [facility] Office: [phone] Pager: [phone]

## 2015-10-17 NOTE — Progress Notes (Signed)
Results for CHEROKEE, CLOWERS (MRN [id number]) as of 10/17/2015 07:29  Ref. Range 10/16/2015 09:01  Iron Latest Ref Range: 45 - 182 ug/dL 80  UIBC Latest Units: ug/dL 169  TIBC Latest Ref Range: 250 - 450 ug/dL 249 (L)  Saturation Ratios Latest Ref Range: 17.9 - 39.5 % 32  Ferritin Latest Ref Range: 24 - 336 ng/mL 477 (H)    Results for TRUMAN, ACEITUNO (MRN [id number]) as of 10/17/2015 07:29  Ref. Range 10/16/2015 09:00  Hemoglobin Latest Ref Range: 13.0 - 17.0 g/dL 12.2 (L)  HCT Latest Ref Range: 39.0 - 52.0 % 36.2 (L)

## 2015-10-29 MED ORDER — DARBEPOETIN ALFA 100 MCG/0.5ML IJ SOSY: 100.0000 ug | PREFILLED_SYRINGE | Freq: Once | INTRAMUSCULAR | Status: DC

## 2015-10-30 ENCOUNTER — Encounter (HOSPITAL_COMMUNITY)
Admission: RE | Admit: 2015-10-30 | Discharge: 2015-10-30 | Disposition: A | Discharge status: Home / Self Care | Payer: Medicare Other | Source: Ambulatory Visit | Attending: Nephrology | Admitting: Nephrology

## 2015-10-30 ENCOUNTER — Encounter (HOSPITAL_COMMUNITY): Payer: Self-pay

## 2015-10-30 DIAGNOSIS — N184 Chronic kidney disease, stage 4 (severe): Secondary | ICD-10-CM | POA: Diagnosis not present

## 2015-10-30 LAB — POCT HEMOGLOBIN-HEMACUE: HEMOGLOBIN: 11.4 g/dL — AB (ref 13.0–17.0)

## 2015-10-30 MED ORDER — DARBEPOETIN ALFA 100 MCG/0.5ML IJ SOSY
100.0000 ug | PREFILLED_SYRINGE | Freq: Once | INTRAMUSCULAR | Status: AC
  Administered 2015-10-30: 100 ug via SUBCUTANEOUS
  Filled 2015-10-30: qty 0.5

## 2015-10-30 NOTE — Progress Notes (Addendum)
Hgb: 11.4

## 2015-11-06 ENCOUNTER — Telehealth: Payer: Self-pay

## 2015-11-06 NOTE — Telephone Encounter (Signed)
Received call from Miguel Tucker requesting to cancel his procedure on 11/19/15. Patient is scheduled for left arm second stage basilic vein transposition with Dr. Oneida Alar. Patient states, "my son bought me and my wife tickets to come see him and go on vacation." Miguel Tucker does not want to reschedule this procedure at this time but states he will contact our office to reschedule at a later time. Instructed patient to contact our office with any questions/concerns. Patient verbalized understanding.

## 2015-11-19 ENCOUNTER — Other Ambulatory Visit (HOSPITAL_COMMUNITY): Payer: Self-pay | Admitting: *Deleted

## 2015-11-19 ENCOUNTER — Ambulatory Visit: Admit: 2015-11-19 | Payer: Medicare Other | Admitting: Vascular Surgery

## 2015-11-19 SURGERY — TRANSPOSITION, VEIN, BASILIC
Anesthesia: Choice | Laterality: Left

## 2015-11-20 ENCOUNTER — Encounter (HOSPITAL_COMMUNITY)
Admission: RE | Admit: 2015-11-20 | Discharge: 2015-11-20 | Disposition: A | Discharge status: Home / Self Care | Payer: Medicare Other | Source: Ambulatory Visit | Attending: Nephrology | Admitting: Nephrology

## 2015-11-20 ENCOUNTER — Encounter (HOSPITAL_COMMUNITY): Payer: Self-pay

## 2015-11-20 DIAGNOSIS — N184 Chronic kidney disease, stage 4 (severe): Secondary | ICD-10-CM | POA: Diagnosis present

## 2015-11-20 DIAGNOSIS — D638 Anemia in other chronic diseases classified elsewhere: Secondary | ICD-10-CM | POA: Insufficient documentation

## 2015-11-20 LAB — POCT HEMOGLOBIN-HEMACUE: HEMOGLOBIN: 12.6 g/dL — AB (ref 13.0–17.0)

## 2015-11-20 NOTE — Progress Notes (Signed)
Results for ERON, STAAT (MRN [id number]) as of 11/20/2015 10:41  Ref. Range 11/20/2015 10:04  Hemoglobin Latest Ref Range: 13.0 - 17.0 g/dL 12.6 (L)

## 2015-12-04 ENCOUNTER — Encounter (HOSPITAL_COMMUNITY)
Admission: RE | Admit: 2015-12-04 | Discharge: 2015-12-04 | Disposition: A | Discharge status: Home / Self Care | Payer: Medicare Other | Source: Ambulatory Visit | Attending: Nephrology | Admitting: Nephrology

## 2015-12-04 ENCOUNTER — Encounter (HOSPITAL_COMMUNITY): Payer: Self-pay

## 2015-12-04 ENCOUNTER — Other Ambulatory Visit (HOSPITAL_COMMUNITY): Payer: Medicare Other

## 2015-12-04 DIAGNOSIS — N184 Chronic kidney disease, stage 4 (severe): Secondary | ICD-10-CM | POA: Diagnosis not present

## 2015-12-04 LAB — POCT HEMOGLOBIN-HEMACUE: HEMOGLOBIN: 11.6 g/dL — AB (ref 13.0–17.0)

## 2015-12-04 MED ORDER — DARBEPOETIN ALFA 100 MCG/0.5ML IJ SOSY
100.0000 ug | PREFILLED_SYRINGE | Freq: Once | INTRAMUSCULAR | Status: AC
Start: 2015-12-04 — End: 2015-12-04
  Administered 2015-12-04: 100 ug via SUBCUTANEOUS

## 2015-12-04 MED ORDER — DARBEPOETIN ALFA 100 MCG/0.5ML IJ SOSY
PREFILLED_SYRINGE | INTRAMUSCULAR | Status: AC
  Filled 2015-12-04: qty 0.5

## 2015-12-04 NOTE — Progress Notes (Signed)
Results for PAPA, PIERCEFIELD (MRN   [id number]) as of 12/04/2015 09:21  Aranesp 100 mcg given as indicated. Next appointment 12/25/2015   Ref. Range 12/04/2015 09:01  Hemoglobin Latest Ref Range: 13.0 - 17.0 g/dL 11.6 (L)

## 2015-12-25 ENCOUNTER — Encounter (HOSPITAL_COMMUNITY)
Admission: RE | Admit: 2015-12-25 | Discharge: 2015-12-25 | Disposition: A | Discharge status: Home / Self Care | Payer: Medicare Other | Source: Ambulatory Visit | Attending: Nephrology | Admitting: Nephrology

## 2015-12-25 ENCOUNTER — Encounter (HOSPITAL_COMMUNITY): Payer: Self-pay

## 2015-12-25 DIAGNOSIS — D638 Anemia in other chronic diseases classified elsewhere: Secondary | ICD-10-CM | POA: Insufficient documentation

## 2015-12-25 DIAGNOSIS — N184 Chronic kidney disease, stage 4 (severe): Secondary | ICD-10-CM | POA: Diagnosis not present

## 2015-12-25 LAB — IRON AND TIBC
Iron: 82 ug/dL (ref 45–182)
SATURATION RATIOS: 31 % (ref 17.9–39.5)
TIBC: 266 ug/dL (ref 250–450)
UIBC: 184 ug/dL

## 2015-12-25 LAB — FERRITIN: Ferritin: 442 ng/mL — ABNORMAL HIGH (ref 24–336)

## 2015-12-25 MED ORDER — DARBEPOETIN ALFA 100 MCG/0.5ML IJ SOSY
PREFILLED_SYRINGE | INTRAMUSCULAR | Status: AC
  Filled 2015-12-25: qty 0.5

## 2015-12-25 MED ORDER — DARBEPOETIN ALFA 100 MCG/0.5ML IJ SOSY
100.0000 ug | PREFILLED_SYRINGE | Freq: Once | INTRAMUSCULAR | Status: DC
Start: 2015-12-25 — End: 2015-12-25
  Administered 2015-12-25: 100 ug via SUBCUTANEOUS

## 2015-12-26 LAB — POCT HEMOGLOBIN-HEMACUE: HEMOGLOBIN: 11.4 g/dL — AB (ref 13.0–17.0)

## 2015-12-26 NOTE — Progress Notes (Signed)
Results for DYLLIN, GULLEY (MRN [id number]) as of 12/26/2015 10:46  Ref. Range 12/25/2015 09:06 12/25/2015 09:18  Iron Latest Ref Range: 45 - 182 ug/dL  82  UIBC Latest Units: ug/dL  184  TIBC Latest Ref Range: 250 - 450 ug/dL  266  Saturation Ratios Latest Ref Range: 17.9 - 39.5 %  31  Ferritin Latest Ref Range: 24 - 336 ng/mL  442 (H)  Hemoglobin Latest Ref Range: 13.0 - 17.0 g/dL 11.4 (L)

## 2016-01-15 ENCOUNTER — Encounter (HOSPITAL_COMMUNITY)
Admission: RE | Admit: 2016-01-15 | Discharge: 2016-01-15 | Disposition: A | Discharge status: Home / Self Care | Payer: Medicare Other | Source: Ambulatory Visit | Attending: Nephrology | Admitting: Nephrology

## 2016-01-15 DIAGNOSIS — D638 Anemia in other chronic diseases classified elsewhere: Secondary | ICD-10-CM | POA: Diagnosis present

## 2016-01-15 DIAGNOSIS — N184 Chronic kidney disease, stage 4 (severe): Secondary | ICD-10-CM | POA: Insufficient documentation

## 2016-01-15 LAB — POCT HEMOGLOBIN-HEMACUE: Hemoglobin: 11.9 g/dL — ABNORMAL LOW (ref 13.0–17.0)

## 2016-01-15 MED ORDER — DARBEPOETIN ALFA 100 MCG/0.5ML IJ SOSY
PREFILLED_SYRINGE | INTRAMUSCULAR | Status: AC
  Filled 2016-01-15: qty 0.5

## 2016-01-15 MED ORDER — DARBEPOETIN ALFA 100 MCG/0.5ML IJ SOSY
100.0000 ug | PREFILLED_SYRINGE | Freq: Once | INTRAMUSCULAR | Status: AC
  Administered 2016-01-15: 100 ug via SUBCUTANEOUS

## 2016-01-15 NOTE — Progress Notes (Signed)
Results for Miguel Tucker, Miguel Tucker (MRN [id number]) as of 01/15/2016 12:32  Ref. Range 01/15/2016 09:44  Hemoglobin Latest Ref Range: 13.0 - 17.0 g/dL 11.9 (L)

## 2016-01-29 ENCOUNTER — Encounter: Payer: Self-pay | Admitting: Vascular Surgery

## 2016-01-29 ENCOUNTER — Ambulatory Visit (INDEPENDENT_AMBULATORY_CARE_PROVIDER_SITE_OTHER): Payer: Medicare Other | Admitting: Vascular Surgery

## 2016-01-29 VITALS — BP 143/79 | HR 66 | Temp 98.0°F | Resp 80 | Ht 65.0 in | Wt 179.0 lb

## 2016-01-29 DIAGNOSIS — N184 Chronic kidney disease, stage 4 (severe): Secondary | ICD-10-CM

## 2016-01-29 NOTE — Progress Notes (Signed)
Patient is an 83 year old male who returns for postoperative follow-up today. He had a first stage basilic vein transposition fistula on 09/09/2015. He currently is not on hemodialysis. He denies any numbness or tingling in his hand. He denies any incisional drainage.  He was originally scheduled for second stage transposition in November but canceled this due to a family trip. He presents today for further consideration. Other chronic medical problems include diabetes gout hearing loss hypertension asthma all of which are currently stable.  Current Outpatient Prescriptions on File Prior to Visit  Medication Sig Dispense Refill  . allopurinol (ZYLOPRIM) 100 MG tablet Take 100 mg by mouth daily.     [name] amLODipine (NORVASC) 10 MG tablet Take 10 mg by mouth every evening.    [name] aspirin EC 81 MG tablet Take 81 mg by mouth every morning.     . calcitRIOL (ROCALTROL) 0.25 MCG capsule Take 0.25 mcg by mouth daily.     . ferrous sulfate 325 (65 FE) MG tablet Take 325 mg by mouth daily with breakfast.    . finasteride (PROSCAR) 5 MG tablet Take 5 mg by mouth daily.    . furosemide (LASIX) 40 MG tablet Take 40 mg by mouth daily.     [name] labetalol (NORMODYNE) 200 MG tablet Take 200 mg by mouth daily.     . montelukast (SINGULAIR) 10 MG tablet Take 10 mg by mouth every morning.     [name] oxyCODONE-acetaminophen (ROXICET) 5-325 MG tablet Take 1 tablet by mouth every 6 (six) hours as needed for moderate pain or severe pain. 6 tablet 0  . valsartan (DIOVAN) 160 MG tablet Take 160 mg by mouth 2 (two) times daily.     No current facility-administered medications on file prior to visit.           Past Medical History:  Diagnosis Date  . Asthma    . Chronic kidney disease    . Diabetes mellitus      type 2  . Gout    . Hard of hearing    . Hypertension    . Hypokalemia             Past Surgical History:  Procedure Laterality Date  . APPENDECTOMY      . BASCILIC VEIN TRANSPOSITION Left 09/09/2015   Procedure: FIRST STAGE BRACHIAL VEIN TRANSPOSITION LEFT ARM;  Surgeon: Elam Dutch, MD;  Location: [place];  Service: Vascular;  Laterality: Left;  . OTHER SURGICAL HISTORY        testicular tumor- benign   . ROTATOR CUFF REPAIR Right    . WOUND DEBRIDEMENT   06/01/2011    Procedure: DEBRIDEMENT ABDOMINAL WOUND;  Surgeon: [name], MD;  Location: [facility];  Service: General;  Laterality: N/A;  Remove Sutures         Physical exam:    Vitals:   01/29/16 1436 01/29/16 1437  BP: (!) 153/81 (!) 143/79  Pulse: 66   Resp: (!) 80   Temp: 98 F (36.7 C)   TempSrc: Oral   SpO2: 99%   Weight: 179 lb (81.2 kg)   Height: 5\' 5"  (1.651 m)    Left upper extremity: Palpable thrill in fistula left hand warm   Data: Patient had a duplex ultrasound of his AV fistula which showed the fistula diameter is 6-7 mm.   Assessment: Maturing left basilic vein fistula.   Plan: Second stage basilic vein transposition 02/18/2016. Risks benefits possible complications and procedure details were discussed patient  today including not limited to bleeding infection non-maturation of the fistula. He understands and agrees to proceed.   Ruta Hinds, MD [facility] Office: [phone] Pager: [phone]

## 2016-02-02 ENCOUNTER — Other Ambulatory Visit: Payer: Self-pay

## 2016-02-05 ENCOUNTER — Encounter (HOSPITAL_COMMUNITY): Admission: RE | Admit: 2016-02-05 | Payer: Medicare Other | Source: Ambulatory Visit

## 2016-02-10 ENCOUNTER — Encounter (HOSPITAL_COMMUNITY)
Admission: RE | Admit: 2016-02-10 | Discharge: 2016-02-10 | Disposition: A | Discharge status: Home / Self Care | Payer: Medicare Other | Source: Ambulatory Visit | Attending: Nephrology | Admitting: Nephrology

## 2016-02-10 ENCOUNTER — Ambulatory Visit (HOSPITAL_COMMUNITY): Payer: Medicare Other

## 2016-02-10 DIAGNOSIS — N184 Chronic kidney disease, stage 4 (severe): Secondary | ICD-10-CM | POA: Diagnosis not present

## 2016-02-10 DIAGNOSIS — D631 Anemia in chronic kidney disease: Secondary | ICD-10-CM | POA: Insufficient documentation

## 2016-02-10 LAB — POCT HEMOGLOBIN-HEMACUE: Hemoglobin: 11.1 g/dL — ABNORMAL LOW (ref 13.0–17.0)

## 2016-02-10 MED ORDER — DARBEPOETIN ALFA 100 MCG/0.5ML IJ SOSY
100.0000 ug | PREFILLED_SYRINGE | INTRAMUSCULAR | Status: DC
  Administered 2016-02-10: 100 ug via SUBCUTANEOUS

## 2016-02-10 MED ORDER — DARBEPOETIN ALFA 100 MCG/0.5ML IJ SOSY
PREFILLED_SYRINGE | INTRAMUSCULAR | Status: AC
  Filled 2016-02-10: qty 0.5

## 2016-02-10 NOTE — Progress Notes (Signed)
Results for Miguel Tucker, Miguel Tucker (MRN [id number]) as of 02/10/2016 14:08  Ref. Range 02/10/2016 12:12  Hemoglobin Latest Ref Range: 13.0 - 17.0 g/dL 11.1 (L)

## 2016-02-19 ENCOUNTER — Encounter (HOSPITAL_COMMUNITY): Payer: Self-pay | Admitting: *Deleted

## 2016-02-19 MED ORDER — VANCOMYCIN HCL 10 G IV SOLR
1250.0000 mg | INTRAVENOUS | Status: AC
  Administered 2016-02-20: 1250 mg via INTRAVENOUS
  Filled 2016-02-19: qty 1250

## 2016-02-19 NOTE — Progress Notes (Signed)
Spoke with pt for pre-op call. Pt denies cardiac history. Pt is diabetic. He can't remember what his most recent A1C was, he thinks it was done 2 months ago at Dr. Loren Racer office. Have called Dr. Loren Racer office and requested that result. Pt was unsure about his fasting blood sugar. He first told me that he didn't check it anymore because his "cuff had messed up". I explained that I was asking about blood sugar not blood pressure. He then stated it's usually around 18 or 26, I asked him again, telling him that 25 or 26 is very low. He then stated no, it's usually good. Instructed pt to check his blood sugar in the AM when he gets up and every 2 hours until he leaves for the the hospital. If blood sugar is 70 or below, treat with 1/2 cup of clear juice (apple or cranberry) and recheck blood sugar 15 minutes after drinking juice. If blood sugar continues to be 70 or below, call the Short Stay department and ask to speak to a nurse. I reinforced with pt that only apple juice or cranberry juice be used IF blood sugar is 70 or less. He did ask if orange juice is ok and I told him no. I suggested that he get a little bottle of either apple or cranberry juice to have on hand if he needs it. He stated he would do that.

## 2016-02-20 ENCOUNTER — Encounter (HOSPITAL_COMMUNITY): Payer: Self-pay | Admitting: *Deleted

## 2016-02-20 ENCOUNTER — Ambulatory Visit (HOSPITAL_COMMUNITY): Payer: Medicare Other | Admitting: Anesthesiology

## 2016-02-20 ENCOUNTER — Encounter (HOSPITAL_COMMUNITY)
Admission: RE | Disposition: A | Discharge status: Home / Self Care | Payer: Self-pay | Source: Ambulatory Visit | Attending: Vascular Surgery

## 2016-02-20 ENCOUNTER — Ambulatory Visit (HOSPITAL_COMMUNITY)
Admission: RE | Admit: 2016-02-20 | Discharge: 2016-02-20 | Disposition: A | Discharge status: Home / Self Care | Payer: Medicare Other | Source: Ambulatory Visit | Attending: Vascular Surgery | Admitting: Vascular Surgery

## 2016-02-20 DIAGNOSIS — N189 Chronic kidney disease, unspecified: Secondary | ICD-10-CM | POA: Insufficient documentation

## 2016-02-20 DIAGNOSIS — M109 Gout, unspecified: Secondary | ICD-10-CM | POA: Diagnosis not present

## 2016-02-20 DIAGNOSIS — Z9049 Acquired absence of other specified parts of digestive tract: Secondary | ICD-10-CM | POA: Insufficient documentation

## 2016-02-20 DIAGNOSIS — I129 Hypertensive chronic kidney disease with stage 1 through stage 4 chronic kidney disease, or unspecified chronic kidney disease: Secondary | ICD-10-CM | POA: Insufficient documentation

## 2016-02-20 DIAGNOSIS — Z9889 Other specified postprocedural states: Secondary | ICD-10-CM | POA: Insufficient documentation

## 2016-02-20 DIAGNOSIS — Z7982 Long term (current) use of aspirin: Secondary | ICD-10-CM | POA: Diagnosis not present

## 2016-02-20 DIAGNOSIS — N185 Chronic kidney disease, stage 5: Secondary | ICD-10-CM | POA: Diagnosis not present

## 2016-02-20 DIAGNOSIS — Z87891 Personal history of nicotine dependence: Secondary | ICD-10-CM | POA: Diagnosis not present

## 2016-02-20 DIAGNOSIS — J45909 Unspecified asthma, uncomplicated: Secondary | ICD-10-CM | POA: Diagnosis not present

## 2016-02-20 DIAGNOSIS — E1122 Type 2 diabetes mellitus with diabetic chronic kidney disease: Secondary | ICD-10-CM | POA: Diagnosis present

## 2016-02-20 HISTORY — DX: Nocturia: R35.1

## 2016-02-20 HISTORY — PX: BASCILIC VEIN TRANSPOSITION: SHX5742

## 2016-02-20 LAB — GLUCOSE, CAPILLARY: GLUCOSE-CAPILLARY: 81 mg/dL (ref 65–99)

## 2016-02-20 LAB — POCT I-STAT 4, (NA,K, GLUC, HGB,HCT)
Glucose, Bld: 100 mg/dL — ABNORMAL HIGH (ref 65–99)
HCT: 33 % — ABNORMAL LOW (ref 39.0–52.0)
HEMOGLOBIN: 11.2 g/dL — AB (ref 13.0–17.0)
Potassium: 3.7 mmol/L (ref 3.5–5.1)
SODIUM: 143 mmol/L (ref 135–145)

## 2016-02-20 SURGERY — TRANSPOSITION, VEIN, BASILIC
Anesthesia: General | Site: Arm Upper | Laterality: Left

## 2016-02-20 MED ORDER — EPHEDRINE SULFATE 50 MG/ML IJ SOLN
INTRAMUSCULAR | Status: DC | PRN
  Administered 2016-02-20: 5 mg via INTRAVENOUS
  Administered 2016-02-20: 15 mg via INTRAVENOUS
  Administered 2016-02-20: 10 mg via INTRAVENOUS

## 2016-02-20 MED ORDER — BUPIVACAINE-EPINEPHRINE (PF) 0.5% -1:200000 IJ SOLN
INTRAMUSCULAR | Status: AC
  Filled 2016-02-20: qty 30

## 2016-02-20 MED ORDER — CHLORHEXIDINE GLUCONATE CLOTH 2 % EX PADS: 6.0000 | MEDICATED_PAD | Freq: Once | CUTANEOUS | Status: DC

## 2016-02-20 MED ORDER — SODIUM CHLORIDE 0.9 % IV SOLN
INTRAVENOUS | Status: DC
  Administered 2016-02-20: 12:00:00 via INTRAVENOUS

## 2016-02-20 MED ORDER — HYDROCODONE-ACETAMINOPHEN 7.5-325 MG PO TABS: 1.0000 | ORAL_TABLET | Freq: Once | ORAL | Status: DC | PRN

## 2016-02-20 MED ORDER — FENTANYL CITRATE (PF) 100 MCG/2ML IJ SOLN
INTRAMUSCULAR | Status: DC | PRN
  Administered 2016-02-20 (×2): 50 ug via INTRAVENOUS

## 2016-02-20 MED ORDER — HEPARIN SODIUM (PORCINE) 5000 UNIT/ML IJ SOLN
INTRAMUSCULAR | Status: DC | PRN
  Administered 2016-02-20: 500 mL

## 2016-02-20 MED ORDER — ONDANSETRON HCL 4 MG/2ML IJ SOLN: 4.0000 mg | Freq: Once | INTRAMUSCULAR | Status: DC | PRN

## 2016-02-20 MED ORDER — PHENYLEPHRINE HCL 10 MG/ML IJ SOLN
INTRAMUSCULAR | Status: DC | PRN
  Administered 2016-02-20: 40 ug via INTRAVENOUS
  Administered 2016-02-20: 200 ug via INTRAVENOUS
  Administered 2016-02-20: 120 ug via INTRAVENOUS
  Administered 2016-02-20: 80 ug via INTRAVENOUS

## 2016-02-20 MED ORDER — SODIUM CHLORIDE 0.9 % IV SOLN: INTRAVENOUS | Status: DC

## 2016-02-20 MED ORDER — PROPOFOL 10 MG/ML IV BOLUS
INTRAVENOUS | Status: AC
  Filled 2016-02-20: qty 20

## 2016-02-20 MED ORDER — LIDOCAINE HCL (CARDIAC) 20 MG/ML IV SOLN
INTRAVENOUS | Status: DC | PRN
  Administered 2016-02-20: 60 mg via INTRATRACHEAL

## 2016-02-20 MED ORDER — FENTANYL CITRATE (PF) 100 MCG/2ML IJ SOLN: 25.0000 ug | INTRAMUSCULAR | Status: DC | PRN

## 2016-02-20 MED ORDER — EPHEDRINE 5 MG/ML INJ
INTRAVENOUS | Status: AC
  Filled 2016-02-20: qty 10

## 2016-02-20 MED ORDER — FENTANYL CITRATE (PF) 100 MCG/2ML IJ SOLN
INTRAMUSCULAR | Status: AC
  Filled 2016-02-20: qty 2

## 2016-02-20 MED ORDER — PROPOFOL 10 MG/ML IV BOLUS
INTRAVENOUS | Status: DC | PRN
  Administered 2016-02-20: 130 mg via INTRAVENOUS
  Administered 2016-02-20: 30 mg via INTRAVENOUS
  Administered 2016-02-20: 70 mg via INTRAVENOUS

## 2016-02-20 MED ORDER — OXYCODONE-ACETAMINOPHEN 5-325 MG PO TABS: 1.0000 | ORAL_TABLET | Freq: Four times a day (QID) | ORAL | 0 refills | Status: DC | PRN

## 2016-02-20 MED ORDER — 0.9 % SODIUM CHLORIDE (POUR BTL) OPTIME
TOPICAL | Status: DC | PRN
Start: 2016-02-20 — End: 2016-02-20
  Administered 2016-02-20: 1000 mL

## 2016-02-20 MED ORDER — MEPERIDINE HCL 25 MG/ML IJ SOLN
6.2500 mg | INTRAMUSCULAR | Status: DC | PRN
Start: 2016-02-20 — End: 2016-02-20

## 2016-02-20 MED ORDER — SODIUM CHLORIDE 0.9 % IV SOLN
INTRAVENOUS | Status: DC
  Administered 2016-02-20: 10:00:00 via INTRAVENOUS

## 2016-02-20 MED ORDER — LIDOCAINE-EPINEPHRINE (PF) 1 %-1:200000 IJ SOLN
INTRAMUSCULAR | Status: AC
  Filled 2016-02-20: qty 30

## 2016-02-20 SURGICAL SUPPLY — 46 items
ADH SKN CLS APL DERMABOND .7 (GAUZE/BANDAGES/DRESSINGS) ×1
AGENT HMST SPONGE THK3/8 (HEMOSTASIS)
ARMBAND PINK RESTRICT EXTREMIT (MISCELLANEOUS) ×2 IMPLANT
CANISTER SUCTION 2500CC (MISCELLANEOUS) ×2 IMPLANT
CANNULA VESSEL 3MM 2 BLNT TIP (CANNULA) ×2 IMPLANT
CLIP LIGATING EXTRA MED SLVR (CLIP) ×1 IMPLANT
CLIP LIGATING EXTRA SM BLUE (MISCELLANEOUS) ×1 IMPLANT
CLIP TI MEDIUM 24 (CLIP) ×1 IMPLANT
CLIP TI WIDE RED SMALL 24 (CLIP) ×1 IMPLANT
COVER PROBE W GEL 5X96 (DRAPES) ×1 IMPLANT
DECANTER SPIKE VIAL GLASS SM (MISCELLANEOUS) ×2 IMPLANT
DERMABOND ADVANCED (GAUZE/BANDAGES/DRESSINGS) ×1
DERMABOND ADVANCED .7 DNX12 (GAUZE/BANDAGES/DRESSINGS) ×1 IMPLANT
DRAIN PENROSE 1/4X12 LTX STRL (WOUND CARE) IMPLANT
ELECT REM PT RETURN 9FT ADLT (ELECTROSURGICAL) ×2
ELECTRODE REM PT RTRN 9FT ADLT (ELECTROSURGICAL) ×1 IMPLANT
GLOVE BIO SURGEON STRL SZ 6.5 (GLOVE) ×1 IMPLANT
GLOVE BIO SURGEON STRL SZ7.5 (GLOVE) ×1 IMPLANT
GLOVE BIOGEL PI IND STRL 6.5 (GLOVE) IMPLANT
GLOVE BIOGEL PI IND STRL 7.0 (GLOVE) IMPLANT
GLOVE BIOGEL PI INDICATOR 6.5 (GLOVE) ×2
GLOVE BIOGEL PI INDICATOR 7.0 (GLOVE) ×3
GLOVE SS BIOGEL STRL SZ 7.5 (GLOVE) IMPLANT
GLOVE SUPERSENSE BIOGEL SZ 7.5 (GLOVE) ×1
GLOVE SURG SS PI 6.5 STRL IVOR (GLOVE) ×2 IMPLANT
GOWN STRL REUS W/ TWL LRG LVL3 (GOWN DISPOSABLE) ×3 IMPLANT
GOWN STRL REUS W/TWL LRG LVL3 (GOWN DISPOSABLE) ×8
HEMOSTAT SPONGE AVITENE ULTRA (HEMOSTASIS) IMPLANT
KIT BASIN OR (CUSTOM PROCEDURE TRAY) ×2 IMPLANT
KIT ROOM TURNOVER OR (KITS) ×2 IMPLANT
LOOP VESSEL MINI RED (MISCELLANEOUS) IMPLANT
NS IRRIG 1000ML POUR BTL (IV SOLUTION) ×2 IMPLANT
PACK CV ACCESS (CUSTOM PROCEDURE TRAY) ×2 IMPLANT
PAD ARMBOARD 7.5X6 YLW CONV (MISCELLANEOUS) ×4 IMPLANT
SUT PROLENE 6 0 CC (SUTURE) ×1 IMPLANT
SUT PROLENE 7 0 BV 1 (SUTURE) ×1 IMPLANT
SUT SILK 2 0 SH (SUTURE) ×1 IMPLANT
SUT SILK 3 0 (SUTURE) ×4
SUT SILK 3-0 18XBRD TIE 12 (SUTURE) IMPLANT
SUT SILK 4 0 (SUTURE) ×2
SUT SILK 4-0 18XBRD TIE 12 (SUTURE) IMPLANT
SUT VIC AB 3-0 SH 27 (SUTURE) ×4
SUT VIC AB 3-0 SH 27X BRD (SUTURE) ×1 IMPLANT
SUT VICRYL 4-0 PS2 18IN ABS (SUTURE) ×2 IMPLANT
UNDERPAD 30X30 (UNDERPADS AND DIAPERS) ×2 IMPLANT
WATER STERILE IRR 1000ML POUR (IV SOLUTION) ×2 IMPLANT

## 2016-02-20 NOTE — Op Note (Signed)
    OPERATIVE REPORT  DATE OF SURGERY: 02/20/2016  PATIENT: Miguel Tucker, 83 y.o. male MRN: [id number]  DOB: 1930/01/07  PRE-OPERATIVE DIAGNOSIS: Chronic renal insufficiency  POST-OPERATIVE DIAGNOSIS:  Same  PROCEDURE: Second stage basilic vein transposition fistula  SURGEON:  Curt Jews, [name]  PHYSICIAN ASSISTANT: Pervis Hocking [facility]  ANESTHESIA:  Gen.  EBL: Minimal ml  Total I/O In: 600 [I.V.:600] Out: 50 [Blood:50]  BLOOD ADMINISTERED: None  DRAINS: None  SPECIMEN: None  COUNTS CORRECT:  YES  PLAN OF CARE: PACU   PATIENT DISPOSITION:  PACU - hemodynamically stable  PROCEDURE DETAILS: The patient was taken the operative placed supine position where the area of the left arm was prepped and draped in sterile fashion. SonoSite ultrasound was used to visualize the basilic vein from the antecubital space to the axilla and this was marked. Incision was made over the prior brachial artery to basilic vein anastomosis a second incision was made in the mid upper arm and a third at the axilla. Patient had multiple tributary branches and these were ligated with 3 or 4 silk ties and divided. The vein was mobilized circumferentially from the antecubital incision to the axilla. The vein was occluded near the prior brachial anastomosis and was transected. The vein was brought out through the upper incision at the axilla. The vein was gently dilated was of excellent caliber. The vein is been marked to reduce risk of twisting. A straight tunnel was created from the antecubital space up to the axilla and the vein was brought through the tunnel. This was in the subcutaneous tissue. The vein was cut to appropriate length and was spatulated and sewn into into the old vein just at the brachial artery anastomosis with a running 6-0 Prolene suture. Clamps removed next the throes noted. The wound irrigated with saline. Hemostasis tablet cautery. Wounds were closed with 3-0 Vicryl in the subcutaneous  and subcuticular tissue. Sterile dressing was applied the patient was transferred to the recovery room in stable condition. Patient has a 2+ left radial pulse.   Rosetta Posner, [name], [facility] 02/20/2016 2:37 PM

## 2016-02-20 NOTE — Anesthesia Preprocedure Evaluation (Signed)
Anesthesia Evaluation  Patient identified by MRN, date of birth, ID band Patient awake    Reviewed: Allergy & Precautions, NPO status , Patient's Chart, lab work & pertinent test results, reviewed documented beta blocker date and time   Airway Mallampati: II       Dental  (+) Partial Upper, Partial Lower   Pulmonary asthma , former smoker,    Pulmonary exam normal breath sounds clear to auscultation       Cardiovascular hypertension, Pt. on medications and Pt. on home beta blockers Normal cardiovascular exam Rhythm:Regular Rate:Normal     Neuro/Psych negative neurological ROS  negative psych ROS   GI/Hepatic Neg liver ROS,   Endo/Other  diabetes, Well Controlled, Type 2Gout  Renal/GU Dialysis and ESRFRenal disease  negative genitourinary   Musculoskeletal negative musculoskeletal ROS (+)   Abdominal Normal abdominal exam  (+)   Peds  Hematology negative hematology ROS (+)   Anesthesia Other Findings   Reproductive/Obstetrics                             Anesthesia Physical Anesthesia Plan  ASA: III  Anesthesia Plan: General   Post-op Pain Management:    Induction: Intravenous  Airway Management Planned: LMA and Oral ETT  Additional Equipment:   Intra-op Plan:   Post-operative Plan: Extubation in OR  Informed Consent: I have reviewed the patients History and Physical, chart, labs and discussed the procedure including the risks, benefits and alternatives for the proposed anesthesia with the patient or authorized representative who has indicated his/her understanding and acceptance.   Dental advisory given  Plan Discussed with: CRNA, Anesthesiologist and Surgeon  Anesthesia Plan Comments:         Anesthesia Quick Evaluation

## 2016-02-20 NOTE — Interval H&P Note (Signed)
History and Physical Interval Note:  02/20/2016 11:25 AM  Miguel Tucker  has presented today for surgery, with the diagnosis of Stage IV chronic kidney disease N18.4  The various methods of treatment have been discussed with the patient and family. After consideration of risks, benefits and other options for treatment, the patient has consented to  Procedure(s): SECOND STAGE BASILIC VEIN TRANSPOSITION (Left) as a surgical intervention .  The patient's history has been reviewed, patient examined, no change in status, stable for surgery.  I have reviewed the patient's chart and labs.  Questions were answered to the patient's satisfaction.     Curt Jews

## 2016-02-20 NOTE — Anesthesia Procedure Notes (Signed)
Procedure Name: LMA Insertion Date/Time: 02/20/2016 12:14 PM Performed by: Maude Leriche D Pre-anesthesia Checklist: Patient identified, Emergency Drugs available, Suction available, Patient being monitored and Timeout performed Patient Re-evaluated:Patient Re-evaluated prior to inductionOxygen Delivery Method: Circle system utilized Preoxygenation: Pre-oxygenation with 100% oxygen Intubation Type: IV induction LMA: LMA inserted LMA Size: 4.0 Number of attempts: 2 Placement Confirmation: positive ETCO2 and breath sounds checked- equal and bilateral Tube secured with: Tape Dental Injury: Teeth and Oropharynx as per pre-operative assessment

## 2016-02-20 NOTE — Transfer of Care (Signed)
Immediate Anesthesia Transfer of Care Note  Patient: Miguel Tucker  Procedure(s) Performed: Procedure(s): LEFT ARM SECOND STAGE BASILIC VEIN TRANSPOSITION (Left)  Patient Location: PACU  Anesthesia Type:General  Level of Consciousness: awake  Airway & Oxygen Therapy: Patient Spontanous Breathing and Patient connected to face mask oxygen  Post-op Assessment: Report given to RN and Post -op Vital signs reviewed and stable  Post vital signs: Reviewed and stable  Last Vitals:  Vitals:   02/20/16 0913  BP: (!) 170/80  Pulse: 67  Resp: 20  Temp: 36.7 C    Last Pain:  Vitals:   02/20/16 0913  TempSrc: Oral      Patients Stated Pain Goal: 2 (76/15/18 3437)  Complications: No apparent anesthesia complications

## 2016-02-20 NOTE — H&P (View-Only) (Signed)
Patient is an 83 year old male who returns for postoperative follow-up today. He had a first stage basilic vein transposition fistula on 09/09/2015. He currently is not on hemodialysis. He denies any numbness or tingling in his hand. He denies any incisional drainage.  He was originally scheduled for second stage transposition in November but canceled this due to a family trip. He presents today for further consideration. Other chronic medical problems include diabetes gout hearing loss hypertension asthma all of which are currently stable.  Current Outpatient Prescriptions on File Prior to Visit  Medication Sig Dispense Refill  . allopurinol (ZYLOPRIM) 100 MG tablet Take 100 mg by mouth daily.     [name] amLODipine (NORVASC) 10 MG tablet Take 10 mg by mouth every evening.    [name] aspirin EC 81 MG tablet Take 81 mg by mouth every morning.     . calcitRIOL (ROCALTROL) 0.25 MCG capsule Take 0.25 mcg by mouth daily.     . ferrous sulfate 325 (65 FE) MG tablet Take 325 mg by mouth daily with breakfast.    . finasteride (PROSCAR) 5 MG tablet Take 5 mg by mouth daily.    . furosemide (LASIX) 40 MG tablet Take 40 mg by mouth daily.     [name] labetalol (NORMODYNE) 200 MG tablet Take 200 mg by mouth daily.     . montelukast (SINGULAIR) 10 MG tablet Take 10 mg by mouth every morning.     [name] oxyCODONE-acetaminophen (ROXICET) 5-325 MG tablet Take 1 tablet by mouth every 6 (six) hours as needed for moderate pain or severe pain. 6 tablet 0  . valsartan (DIOVAN) 160 MG tablet Take 160 mg by mouth 2 (two) times daily.     No current facility-administered medications on file prior to visit.           Past Medical History:  Diagnosis Date  . Asthma    . Chronic kidney disease    . Diabetes mellitus      type 2  . Gout    . Hard of hearing    . Hypertension    . Hypokalemia             Past Surgical History:  Procedure Laterality Date  . APPENDECTOMY      . BASCILIC VEIN TRANSPOSITION Left 09/09/2015   Procedure: FIRST STAGE BRACHIAL VEIN TRANSPOSITION LEFT ARM;  Surgeon: Elam Dutch, MD;  Location: [place];  Service: Vascular;  Laterality: Left;  . OTHER SURGICAL HISTORY        testicular tumor- benign   . ROTATOR CUFF REPAIR Right    . WOUND DEBRIDEMENT   06/01/2011    Procedure: DEBRIDEMENT ABDOMINAL WOUND;  Surgeon: [name], MD;  Location: [facility];  Service: General;  Laterality: N/A;  Remove Sutures         Physical exam:    Vitals:   01/29/16 1436 01/29/16 1437  BP: (!) 153/81 (!) 143/79  Pulse: 66   Resp: (!) 80   Temp: 98 F (36.7 C)   TempSrc: Oral   SpO2: 99%   Weight: 179 lb (81.2 kg)   Height: 5\' 5"  (1.651 m)    Left upper extremity: Palpable thrill in fistula left hand warm   Data: Patient had a duplex ultrasound of his AV fistula which showed the fistula diameter is 6-7 mm.   Assessment: Maturing left basilic vein fistula.   Plan: Second stage basilic vein transposition 02/18/2016. Risks benefits possible complications and procedure details were discussed patient  today including not limited to bleeding infection non-maturation of the fistula. He understands and agrees to proceed.   Ruta Hinds, MD [facility] [place] Office: [phone] Pager: [phone]

## 2016-02-23 ENCOUNTER — Encounter (HOSPITAL_COMMUNITY): Payer: Self-pay | Admitting: Vascular Surgery

## 2016-02-23 ENCOUNTER — Telehealth: Payer: Self-pay | Admitting: Vascular Surgery

## 2016-02-23 NOTE — Telephone Encounter (Signed)
-----   Message from Mena Goes, RN sent at 02/21/2016 12:36 PM EST ----- Regarding: 4-6 weeks   ----- Message ----- From: Alvia Grove, PA-C Sent: 02/20/2016   2:24 PM To: Vvs Charge Pool  S/p left 2nd stage BVT 02/20/16  F/u with PA on [facility] clinic day in 4-6 weeks. No duplex.  Thanks Maudie Mercury

## 2016-02-23 NOTE — Telephone Encounter (Signed)
Scheduled appointment for 4/3 with the PA on a day that [facility] is here.

## 2016-02-27 NOTE — Anesthesia Postprocedure Evaluation (Addendum)
Anesthesia Post Note  Patient: Miguel Tucker  Procedure(s) Performed: Procedure(s) (LRB): LEFT ARM SECOND STAGE BASILIC VEIN TRANSPOSITION (Left)  Patient location during evaluation: PACU Anesthesia Type: General Level of consciousness: awake and alert, sedated and awake Pain management: pain level controlled Vital Signs Assessment: post-procedure vital signs reviewed and stable Respiratory status: spontaneous breathing, nonlabored ventilation, respiratory function stable and patient connected to nasal cannula oxygen Cardiovascular status: blood pressure returned to baseline and stable Postop Assessment: no signs of nausea or vomiting Anesthetic complications: no       Last Vitals:  Vitals:   02/20/16 1500 02/20/16 1522  BP: (!) 161/81 (!) 179/81  Pulse: 69 68  Resp: 14 16  Temp: 36.7 C     Last Pain:  Vitals:   02/20/16 0913  TempSrc: Oral                 Azaylah Stailey,JAMES TERRILL

## 2016-03-02 ENCOUNTER — Encounter (HOSPITAL_COMMUNITY)
Admission: RE | Admit: 2016-03-02 | Discharge: 2016-03-02 | Disposition: A | Discharge status: Home / Self Care | Payer: Medicare Other | Source: Ambulatory Visit | Attending: Nephrology | Admitting: Nephrology

## 2016-03-02 DIAGNOSIS — N184 Chronic kidney disease, stage 4 (severe): Secondary | ICD-10-CM | POA: Diagnosis not present

## 2016-03-02 LAB — HEMOGLOBIN AND HEMATOCRIT, BLOOD
HCT: 34.9 % — ABNORMAL LOW (ref 39.0–52.0)
HEMOGLOBIN: 11.7 g/dL — AB (ref 13.0–17.0)

## 2016-03-02 LAB — IRON AND TIBC
Iron: 78 ug/dL (ref 45–182)
Saturation Ratios: 30 % (ref 17.9–39.5)
TIBC: 258 ug/dL (ref 250–450)
UIBC: 180 ug/dL

## 2016-03-02 LAB — FERRITIN: Ferritin: 483 ng/mL — ABNORMAL HIGH (ref 24–336)

## 2016-03-02 MED ORDER — DARBEPOETIN ALFA 100 MCG/0.5ML IJ SOSY
PREFILLED_SYRINGE | INTRAMUSCULAR | Status: AC
  Filled 2016-03-02: qty 0.5

## 2016-03-02 MED ORDER — DARBEPOETIN ALFA 100 MCG/0.5ML IJ SOSY
100.0000 ug | PREFILLED_SYRINGE | Freq: Once | INTRAMUSCULAR | Status: AC
  Administered 2016-03-02: 100 ug via SUBCUTANEOUS

## 2016-03-02 NOTE — Progress Notes (Signed)
Results for HYUN, MARSALIS (MRN [id number]) as of 03/02/2016 13:57  Ref. Range 03/02/2016 08:56  Iron Latest Ref Range: 45 - 182 ug/dL 78  UIBC Latest Units: ug/dL 180  TIBC Latest Ref Range: 250 - 450 ug/dL 258  Saturation Ratios Latest Ref Range: 17.9 - 39.5 % 30  Ferritin Latest Ref Range: 24 - 336 ng/mL 483 (H)  Hemoglobin Latest Ref Range: 13.0 - 17.0 g/dL 11.7 (L)  HCT Latest Ref Range: 39.0 - 52.0 % 34.9 (L)

## 2016-03-23 ENCOUNTER — Encounter (HOSPITAL_COMMUNITY)
Admission: RE | Admit: 2016-03-23 | Discharge: 2016-03-23 | Disposition: A | Discharge status: Home / Self Care | Payer: Medicare Other | Source: Ambulatory Visit | Attending: Nephrology | Admitting: Nephrology

## 2016-03-23 NOTE — Progress Notes (Signed)
Called and spoke with the wife concerning Miguel Tucker appointment this morning.  Stated she would let him know when he arrived home.  Instructed the wife to ask him to call and reschedule his appointment. Verbalized understanding.

## 2016-03-26 ENCOUNTER — Encounter: Payer: Self-pay | Admitting: Vascular Surgery

## 2016-04-08 ENCOUNTER — Encounter (HOSPITAL_COMMUNITY)
Admission: RE | Admit: 2016-04-08 | Discharge: 2016-04-08 | Disposition: A | Discharge status: Home / Self Care | Payer: Medicare Other | Source: Ambulatory Visit | Attending: Nephrology | Admitting: Nephrology

## 2016-04-08 ENCOUNTER — Encounter (HOSPITAL_COMMUNITY): Payer: Self-pay

## 2016-04-08 DIAGNOSIS — N184 Chronic kidney disease, stage 4 (severe): Secondary | ICD-10-CM | POA: Diagnosis present

## 2016-04-08 DIAGNOSIS — D631 Anemia in chronic kidney disease: Secondary | ICD-10-CM | POA: Diagnosis not present

## 2016-04-08 LAB — POCT HEMOGLOBIN-HEMACUE: Hemoglobin: 11.6 g/dL — ABNORMAL LOW (ref 13.0–17.0)

## 2016-04-08 MED ORDER — DARBEPOETIN ALFA 100 MCG/0.5ML IJ SOSY
100.0000 ug | PREFILLED_SYRINGE | Freq: Once | INTRAMUSCULAR | Status: AC
  Administered 2016-04-08: 100 ug via SUBCUTANEOUS

## 2016-04-08 MED ORDER — DARBEPOETIN ALFA 100 MCG/0.5ML IJ SOSY
PREFILLED_SYRINGE | INTRAMUSCULAR | Status: AC
  Filled 2016-04-08: qty 0.5

## 2016-04-08 NOTE — Progress Notes (Signed)
Results for RITHIK, ODEA (MRN [id number]) as of 04/08/2016 10:22  Ref. Range 04/08/2016 10:13  Hemoglobin Latest Ref Range: 13.0 - 17.0 g/dL 11.6 (L)

## 2016-04-13 ENCOUNTER — Ambulatory Visit (INDEPENDENT_AMBULATORY_CARE_PROVIDER_SITE_OTHER): Payer: Self-pay | Admitting: Vascular Surgery

## 2016-04-13 VITALS — BP 133/76 | HR 72 | Temp 97.6°F | Resp 16 | Ht 65.0 in | Wt 175.0 lb

## 2016-04-13 DIAGNOSIS — N189 Chronic kidney disease, unspecified: Secondary | ICD-10-CM

## 2016-04-13 NOTE — Progress Notes (Signed)
   Patient name: Miguel Tucker MRN: [id number] DOB: 09-11-1930 Sex: male  REASON FOR VISIT: post-op  HPI: Miguel Tucker is a 83 y.o. male who presents for postoperative follow-up status post second stage left basilic vein transposition by Dr. Donnetta Hutching on 02/20/2016. He is not yet on hemodialysis. He denies any pain or numbness with his left hand. He states that both arms feel the same.  Current Outpatient Prescriptions  Medication Sig Dispense Refill  . allopurinol (ZYLOPRIM) 100 MG tablet Take 100 mg by mouth daily.     [name] amLODipine (NORVASC) 10 MG tablet Take 10 mg by mouth every evening.    [name] aspirin EC 81 MG tablet Take 81 mg by mouth every morning.     . calcitRIOL (ROCALTROL) 0.25 MCG capsule Take 0.25 mcg by mouth daily.     . ferrous sulfate 325 (65 FE) MG tablet Take 325 mg by mouth daily with breakfast.    . finasteride (PROSCAR) 5 MG tablet Take 5 mg by mouth daily.    . furosemide (LASIX) 40 MG tablet Take 40 mg by mouth daily.     [name] labetalol (NORMODYNE) 200 MG tablet Take 200 mg by mouth 2 (two) times daily.     . montelukast (SINGULAIR) 10 MG tablet Take 10 mg by mouth every morning.     [name] oxyCODONE-acetaminophen (PERCOCET/ROXICET) 5-325 MG tablet Take 1 tablet by mouth every 6 (six) hours as needed. 20 tablet 0  . valsartan (DIOVAN) 160 MG tablet Take 160 mg by mouth 2 (two) times daily.     No current facility-administered medications for this visit.     REVIEW OF SYSTEMS:  [X]  denotes positive finding, [ ]  denotes negative finding Cardiac  Comments:  Chest pain or chest pressure:    Shortness of breath upon exertion:    Short of breath when lying flat:    Irregular heart rhythm:    Constitutional    Fever or chills:      PHYSICAL EXAM: Vitals:   04/13/16 1251  BP: 133/76  Pulse: 72  Resp: 16  Temp: 97.6 F (36.4 C)  SpO2: 99%  Weight: 175 lb (79.4 kg)  Height: 5\' 5"  (1.651 m)    GENERAL: The patient is a well-nourished male, in no acute  distress. The vital signs are documented above. PULMONARY: Non labored respiratory effort.  VASCULAR: Left upper arm fistula easily visible with palpable thrill throughout. Incisions healed. 2+ left radial pulse.   MEDICAL ISSUES: s/p 2nd stage left basilic vein transposition  Fistula is mature. The patient's incisions are well healed. No steal symptoms. He is not yet on hemodialysis. Ok to cannulate if patient ever needs dialysis.   Virgina Jock, PA-C [facility] MD: [place]

## 2016-04-29 ENCOUNTER — Encounter (HOSPITAL_COMMUNITY)
Admission: RE | Admit: 2016-04-29 | Discharge: 2016-04-29 | Disposition: A | Discharge status: Home / Self Care | Payer: Medicare Other | Source: Ambulatory Visit | Attending: Nephrology | Admitting: Nephrology

## 2016-04-29 DIAGNOSIS — N184 Chronic kidney disease, stage 4 (severe): Secondary | ICD-10-CM | POA: Diagnosis not present

## 2016-04-29 LAB — IRON AND TIBC
IRON: 73 ug/dL (ref 45–182)
Saturation Ratios: 30 % (ref 17.9–39.5)
TIBC: 244 ug/dL — AB (ref 250–450)
UIBC: 171 ug/dL

## 2016-04-29 LAB — POCT HEMOGLOBIN-HEMACUE: Hemoglobin: 11.2 g/dL — ABNORMAL LOW (ref 13.0–17.0)

## 2016-04-29 LAB — FERRITIN: Ferritin: 314 ng/mL (ref 24–336)

## 2016-04-29 MED ORDER — DARBEPOETIN ALFA 100 MCG/0.5ML IJ SOSY
100.0000 ug | PREFILLED_SYRINGE | INTRAMUSCULAR | Status: DC
  Administered 2016-04-29: 100 ug via SUBCUTANEOUS
  Filled 2016-04-29: qty 0.5

## 2016-05-20 ENCOUNTER — Encounter (HOSPITAL_COMMUNITY)
Admission: RE | Admit: 2016-05-20 | Discharge: 2016-05-20 | Disposition: A | Discharge status: Home / Self Care | Payer: Medicare Other | Source: Ambulatory Visit | Attending: Nephrology | Admitting: Nephrology

## 2016-05-20 ENCOUNTER — Encounter (HOSPITAL_COMMUNITY): Payer: Medicare Other

## 2016-06-04 ENCOUNTER — Encounter (HOSPITAL_COMMUNITY)
Admission: RE | Admit: 2016-06-04 | Discharge: 2016-06-04 | Disposition: A | Discharge status: Home / Self Care | Payer: Medicare Other | Source: Ambulatory Visit | Attending: Nephrology | Admitting: Nephrology

## 2016-06-04 DIAGNOSIS — D631 Anemia in chronic kidney disease: Secondary | ICD-10-CM | POA: Diagnosis not present

## 2016-06-04 DIAGNOSIS — N184 Chronic kidney disease, stage 4 (severe): Secondary | ICD-10-CM | POA: Diagnosis present

## 2016-06-04 LAB — POCT HEMOGLOBIN-HEMACUE: Hemoglobin: 11.3 g/dL — ABNORMAL LOW (ref 13.0–17.0)

## 2016-06-04 MED ORDER — DARBEPOETIN ALFA 100 MCG/0.5ML IJ SOSY
100.0000 ug | PREFILLED_SYRINGE | Freq: Once | INTRAMUSCULAR | Status: AC
Start: 2016-06-04 — End: 2016-06-04
  Administered 2016-06-04: 100 ug via SUBCUTANEOUS
  Filled 2016-06-04: qty 0.5

## 2016-06-04 NOTE — Addendum Note (Signed)
Addendum  created 06/04/16 1134 by Rica Koyanagi, MD   Sign clinical note

## 2016-06-08 NOTE — Progress Notes (Signed)
Results for SALIL, RAINERI (MRN [id number]) as of 06/08/2016 13:39  Ref. Range 06/04/2016 09:03  Hemoglobin Latest Ref Range: 13.0 - 17.0 g/dL 11.3 (L)

## 2016-06-25 ENCOUNTER — Encounter (HOSPITAL_COMMUNITY)
Admission: RE | Admit: 2016-06-25 | Discharge: 2016-06-25 | Disposition: A | Discharge status: Home / Self Care | Payer: Medicare Other | Source: Ambulatory Visit | Attending: Nephrology | Admitting: Nephrology

## 2016-06-25 ENCOUNTER — Encounter (HOSPITAL_COMMUNITY): Payer: Self-pay

## 2016-06-25 DIAGNOSIS — N184 Chronic kidney disease, stage 4 (severe): Secondary | ICD-10-CM | POA: Diagnosis not present

## 2016-06-25 LAB — POCT HEMOGLOBIN-HEMACUE: Hemoglobin: 11.1 g/dL — ABNORMAL LOW (ref 13.0–17.0)

## 2016-06-25 LAB — IRON AND TIBC
Iron: 73 ug/dL (ref 45–182)
Saturation Ratios: 28 % (ref 17.9–39.5)
TIBC: 265 ug/dL (ref 250–450)
UIBC: 192 ug/dL

## 2016-06-25 LAB — FERRITIN: Ferritin: 200 ng/mL (ref 24–336)

## 2016-06-25 MED ORDER — DARBEPOETIN ALFA 100 MCG/0.5ML IJ SOSY
100.0000 ug | PREFILLED_SYRINGE | INTRAMUSCULAR | Status: DC
  Administered 2016-06-25: 100 ug via SUBCUTANEOUS

## 2016-06-25 MED ORDER — DARBEPOETIN ALFA 100 MCG/0.5ML IJ SOSY
PREFILLED_SYRINGE | INTRAMUSCULAR | Status: AC
  Filled 2016-06-25: qty 0.5

## 2016-06-25 NOTE — Progress Notes (Addendum)
Results for EMRE, STOCK (MRN [id number]) as of 06/25/2016 10:03  Aranesp 152mcg given SQ as indicated. Next appointment 07/16/2016 @ 1000  Awaiting Ferritin and Iron panel will fax when available.  Ref. Range 06/25/2016 09:49  Hemoglobin Latest Ref Range: 13.0 - 17.0 g/dL 11.1 (L)

## 2016-06-28 NOTE — Progress Notes (Signed)
Results for Miguel Tucker, Miguel Tucker (MRN [id number]) as of 06/28/2016 08:40  Ref. Range 06/25/2016 10:00  Iron Latest Ref Range: 45 - 182 ug/dL 73  UIBC Latest Units: ug/dL 192  TIBC Latest Ref Range: 250 - 450 ug/dL 265  Saturation Ratios Latest Ref Range: 17.9 - 39.5 % 28  Ferritin Latest Ref Range: 24 - 336 ng/mL 200

## 2016-07-16 ENCOUNTER — Encounter (HOSPITAL_COMMUNITY)
Admission: RE | Admit: 2016-07-16 | Discharge: 2016-07-16 | Disposition: A | Discharge status: Home / Self Care | Payer: Medicare Other | Source: Ambulatory Visit | Attending: Nephrology | Admitting: Nephrology

## 2016-07-16 DIAGNOSIS — D631 Anemia in chronic kidney disease: Secondary | ICD-10-CM | POA: Diagnosis present

## 2016-07-16 DIAGNOSIS — N184 Chronic kidney disease, stage 4 (severe): Secondary | ICD-10-CM | POA: Diagnosis present

## 2016-07-16 LAB — POCT HEMOGLOBIN-HEMACUE: Hemoglobin: 12.5 g/dL — ABNORMAL LOW (ref 13.0–17.0)

## 2016-07-30 ENCOUNTER — Encounter (HOSPITAL_COMMUNITY): Payer: Medicare Other

## 2016-07-30 ENCOUNTER — Encounter (HOSPITAL_COMMUNITY)
Admission: RE | Admit: 2016-07-30 | Discharge: 2016-07-30 | Disposition: A | Discharge status: Home / Self Care | Payer: Medicare Other | Source: Ambulatory Visit | Attending: Nephrology | Admitting: Nephrology

## 2016-08-06 ENCOUNTER — Encounter (HOSPITAL_COMMUNITY)
Admission: RE | Admit: 2016-08-06 | Discharge: 2016-08-06 | Disposition: A | Discharge status: Home / Self Care | Payer: Medicare Other | Source: Ambulatory Visit | Attending: Nephrology | Admitting: Nephrology

## 2016-08-06 ENCOUNTER — Encounter (HOSPITAL_COMMUNITY)
Admission: RE | Admit: 2016-08-06 | Discharge: 2016-08-06 | Disposition: A | Discharge status: Home / Self Care | Payer: Medicare Other | Source: Ambulatory Visit

## 2016-08-06 DIAGNOSIS — N184 Chronic kidney disease, stage 4 (severe): Secondary | ICD-10-CM | POA: Insufficient documentation

## 2016-08-06 DIAGNOSIS — D631 Anemia in chronic kidney disease: Secondary | ICD-10-CM | POA: Insufficient documentation

## 2016-08-06 LAB — POCT HEMOGLOBIN-HEMACUE: Hemoglobin: 10.1 g/dL — ABNORMAL LOW (ref 13.0–17.0)

## 2016-08-06 MED ORDER — DARBEPOETIN ALFA 100 MCG/0.5ML IJ SOSY
PREFILLED_SYRINGE | INTRAMUSCULAR | Status: AC
  Filled 2016-08-06: qty 0.5

## 2016-08-06 MED ORDER — DARBEPOETIN ALFA 100 MCG/0.5ML IJ SOSY
100.0000 ug | PREFILLED_SYRINGE | Freq: Once | INTRAMUSCULAR | Status: AC
  Administered 2016-08-06: 100 ug via SUBCUTANEOUS

## 2016-08-27 ENCOUNTER — Encounter (HOSPITAL_COMMUNITY)
Admission: RE | Admit: 2016-08-27 | Discharge: 2016-08-27 | Disposition: A | Discharge status: Home / Self Care | Payer: Medicare Other | Source: Ambulatory Visit | Attending: Nephrology | Admitting: Nephrology

## 2016-08-27 ENCOUNTER — Encounter (HOSPITAL_COMMUNITY): Payer: Self-pay

## 2016-08-27 DIAGNOSIS — N184 Chronic kidney disease, stage 4 (severe): Secondary | ICD-10-CM | POA: Diagnosis not present

## 2016-08-27 LAB — IRON AND TIBC
Iron: 77 ug/dL (ref 45–182)
SATURATION RATIOS: 29 % (ref 17.9–39.5)
TIBC: 263 ug/dL (ref 250–450)
UIBC: 186 ug/dL

## 2016-08-27 LAB — POCT HEMOGLOBIN-HEMACUE: HEMOGLOBIN: 11.8 g/dL — AB (ref 13.0–17.0)

## 2016-08-27 LAB — FERRITIN: Ferritin: 415 ng/mL — ABNORMAL HIGH (ref 24–336)

## 2016-08-27 MED ORDER — DARBEPOETIN ALFA 100 MCG/0.5ML IJ SOSY
100.0000 ug | PREFILLED_SYRINGE | Freq: Once | INTRAMUSCULAR | Status: AC
  Administered 2016-08-27: 100 ug via SUBCUTANEOUS
  Filled 2016-08-27: qty 0.5

## 2016-09-17 ENCOUNTER — Ambulatory Visit (HOSPITAL_COMMUNITY): Payer: Medicare Other

## 2016-09-17 ENCOUNTER — Inpatient Hospital Stay (HOSPITAL_COMMUNITY): Admission: RE | Admit: 2016-09-17 | Payer: Medicare Other | Source: Ambulatory Visit

## 2016-09-21 ENCOUNTER — Encounter (HOSPITAL_COMMUNITY)
Admission: RE | Admit: 2016-09-21 | Discharge: 2016-09-21 | Disposition: A | Discharge status: Home / Self Care | Payer: Medicare Other | Source: Ambulatory Visit | Attending: Nephrology | Admitting: Nephrology

## 2016-09-21 DIAGNOSIS — D631 Anemia in chronic kidney disease: Secondary | ICD-10-CM | POA: Insufficient documentation

## 2016-09-21 DIAGNOSIS — N184 Chronic kidney disease, stage 4 (severe): Secondary | ICD-10-CM | POA: Insufficient documentation

## 2016-09-22 ENCOUNTER — Encounter (HOSPITAL_COMMUNITY)
Admission: RE | Admit: 2016-09-22 | Discharge: 2016-09-22 | Disposition: A | Discharge status: Home / Self Care | Payer: Medicare Other | Source: Ambulatory Visit | Attending: Nephrology | Admitting: Nephrology

## 2016-09-22 ENCOUNTER — Encounter (HOSPITAL_COMMUNITY): Payer: Medicare Other

## 2016-09-22 ENCOUNTER — Encounter (HOSPITAL_COMMUNITY): Payer: Self-pay

## 2016-09-22 ENCOUNTER — Encounter (HOSPITAL_COMMUNITY): Admission: RE | Admit: 2016-09-22 | Payer: Medicare Other | Source: Ambulatory Visit

## 2016-09-22 DIAGNOSIS — N184 Chronic kidney disease, stage 4 (severe): Secondary | ICD-10-CM | POA: Diagnosis present

## 2016-09-22 DIAGNOSIS — D631 Anemia in chronic kidney disease: Secondary | ICD-10-CM | POA: Diagnosis present

## 2016-09-22 LAB — POCT HEMOGLOBIN-HEMACUE: HEMOGLOBIN: 10.5 g/dL — AB (ref 13.0–17.0)

## 2016-09-22 MED ORDER — DARBEPOETIN ALFA 100 MCG/0.5ML IJ SOSY
100.0000 ug | PREFILLED_SYRINGE | Freq: Once | INTRAMUSCULAR | Status: AC
  Administered 2016-09-22: 100 ug via SUBCUTANEOUS
  Filled 2016-09-22: qty 0.5

## 2016-10-01 ENCOUNTER — Ambulatory Visit (INDEPENDENT_AMBULATORY_CARE_PROVIDER_SITE_OTHER): Payer: Medicare Other

## 2016-10-01 ENCOUNTER — Ambulatory Visit (INDEPENDENT_AMBULATORY_CARE_PROVIDER_SITE_OTHER): Payer: Medicare Other | Admitting: Orthopedic Surgery

## 2016-10-01 ENCOUNTER — Encounter: Payer: Self-pay | Admitting: Orthopedic Surgery

## 2016-10-01 VITALS — BP 110/71 | HR 65 | Ht 64.0 in | Wt 180.0 lb

## 2016-10-01 DIAGNOSIS — M1711 Unilateral primary osteoarthritis, right knee: Secondary | ICD-10-CM | POA: Diagnosis not present

## 2016-10-01 DIAGNOSIS — M25561 Pain in right knee: Secondary | ICD-10-CM | POA: Diagnosis not present

## 2016-10-01 NOTE — Patient Instructions (Addendum)
You have received an injection of steroids into the joint. 15% of patients will have increased pain within the 24 hours postinjection.   This is transient and will go away.   We recommend that you use ice packs on the injection site for 20 minutes every 2 hours and extra strength Tylenol 2 tablets every 8 as needed until the pain resolves.  If you continue to have pain after taking the Tylenol and using the ice please call the office for further instructions. Joint Pain Joint pain, which is also called arthralgia, can be caused by many things. Joint pain often goes away when you follow your health care provider's instructions for relieving pain at home. However, joint pain can also be caused by conditions that require further treatment. Common causes of joint pain include:  Bruising in the area of the joint.  Overuse of the joint.  Wear and tear on the joints that occur with aging (osteoarthritis).  Various other forms of arthritis.  A buildup of a crystal form of uric acid in the joint (gout).  Infections of the joint (septic arthritis) or of the bone (osteomyelitis).  Your health care provider may recommend medicine to help with the pain. If your joint pain continues, additional tests may be needed to diagnose your condition. Follow these instructions at home: Watch your condition for any changes. Follow these instructions as directed to lessen the pain that you are feeling.  Take medicines only as directed by your health care provider.  Rest the affected area for as long as your health care provider says that you should. If directed to do so, raise the painful joint above the level of your heart while you are sitting or lying down.  Do not do things that cause or worsen pain.  If directed, apply ice to the painful area: ? Put ice in a plastic bag. ? Place a towel between your skin and the bag. ? Leave the ice on for 20 minutes, 2-3 times per day.  Wear an elastic bandage,  splint, or sling as directed by your health care provider. Loosen the elastic bandage or splint if your fingers or toes become numb and tingle, or if they turn cold and blue.  Begin exercising or stretching the affected area as directed by your health care provider. Ask your health care provider what types of exercise are safe for you.  Keep all follow-up visits as directed by your health care provider. This is important.  Contact a health care provider if:  Your pain increases, and medicine does not help.  Your joint pain does not improve within 3 days.  You have increased bruising or swelling.  You have a fever.  You lose 10 lb (4.5 kg) or more without trying. Get help right away if:  You are not able to move the joint.  Your fingers or toes become numb or they turn cold and blue. This information is not intended to replace advice given to you by your health care provider. Make sure you discuss any questions you have with your health care provider. Document Released: 12/21/2004 Document Revised: 05/23/2015 Document Reviewed: 10/02/2013 Elsevier Interactive Patient Education  [employer].

## 2016-10-01 NOTE — Progress Notes (Signed)
Patient ID: ESA RADEN, male   DOB: [birth year], 83 y.o.   MRN: [id number]  Chief Complaint  Patient presents with  . Knee Pain    Right knee pain, no injury.    83 year old male history of injection right knee for what he thinks was osteoarthritis about 10 years ago presents now with new onset right knee pain  Pain for 2 weeks no trauma  He described pain as follows Severity moderate to severe Sharp Aching Dull at times Causes decreased range of motion Associated with popping minimal swelling    Review of Systems  Respiratory: Negative for shortness of breath.   Cardiovascular: Negative for chest pain.    Past Medical History:  Diagnosis Date  . Asthma   . Chronic kidney disease   . Diabetes mellitus    type 2  . Frequent urination at night   . Gout   . Hard of hearing   . Hypertension   . Hypokalemia     Past Surgical History:  Procedure Laterality Date  . APPENDECTOMY    . BASCILIC VEIN TRANSPOSITION Left 09/09/2015   Procedure: FIRST STAGE BRACHIAL VEIN TRANSPOSITION LEFT ARM;  Surgeon: Elam Dutch, MD;  Location: [facility];  Service: Vascular;  Laterality: Left;  . BASCILIC VEIN TRANSPOSITION Left 02/20/2016   Procedure: LEFT ARM SECOND STAGE BASILIC VEIN TRANSPOSITION;  Surgeon: Rosetta Posner, MD;  Location: [facility];  Service: Vascular;  Laterality: Left;  . OTHER SURGICAL HISTORY     testicular tumor- benign   . ROTATOR CUFF REPAIR Right   . WOUND DEBRIDEMENT  06/01/2011   Procedure: DEBRIDEMENT ABDOMINAL WOUND;  Surgeon: [name], MD;  Location: [facility];  Service: General;  Laterality: N/A;  Remove Sutures     PHYSICAL EXAM  BP 110/71   Pulse 65   Ht 5\' 4"  (1.626 m)   Wt 180 lb (81.6 kg)   BMI 30.90 kg/m  GENERAL appearance reveals no gross abnormalities, normal development grooming and hygiene   MENTAL STATUS we note that the patient is awake alert and oriented to person place and time MOOD/AFFECT ARE NORMAL   GAIT reveals a mild limp in  the effected limb  Left Knee Exam   Tenderness  None  Range of Motion  Normal left knee ROM  Muscle Strength  Normal left knee strength  Tests  Drawer:       Anterior - Negative       Right Knee Exam  Swelling: None Effusion: No  Tenderness  The patient is experiencing tenderness in the medial joint line.  Range of Motion  Extension: 5 Flexion:     120  Muscle Strength  Normal right knee strength  Tests  Drawer:       Anterior - Negative        VASC 2+ dorsalis pedis pulse normal capillary refill excellent warmth to the extremity  NEURO normal sensation and no pathologic reflexes  LYMPH deferred noncontributory   IMAGING STUDIES  I have independently reviewed the x-rays and I interpreted the x-rays as follows:  I made an independent reading of today's  x-rays; severe osteoarthritis of the knee with moderate varus deformity;   Dx   primary osteoarthritis of the  Right  knee  PLAN   Injection right knee Procedure note right knee injection verbal consent was obtained to inject right knee joint  Timeout was completed to confirm the site of injection  The medications used were 40 mg of Depo-Medrol  and 1% lidocaine 3 cc  Anesthesia was provided by ethyl chloride and the skin was prepped with alcohol.  After cleaning the skin with alcohol a 20-gauge needle was used to inject the right knee joint. There were no complications. A sterile bandage was applied.  Use tylenol for pain   10:45 AM Arther Abbott, MD 10/01/2016

## 2016-10-20 ENCOUNTER — Encounter (HOSPITAL_COMMUNITY)
Admission: RE | Admit: 2016-10-20 | Discharge: 2016-10-20 | Disposition: A | Discharge status: Home / Self Care | Payer: Medicare Other | Source: Ambulatory Visit | Attending: Nephrology | Admitting: Nephrology

## 2016-10-20 DIAGNOSIS — N184 Chronic kidney disease, stage 4 (severe): Secondary | ICD-10-CM | POA: Diagnosis present

## 2016-10-20 DIAGNOSIS — D631 Anemia in chronic kidney disease: Secondary | ICD-10-CM | POA: Diagnosis present

## 2016-10-20 LAB — RENAL FUNCTION PANEL
Albumin: 4.2 g/dL (ref 3.5–5.0)
Anion gap: 9 (ref 5–15)
BUN: 52 mg/dL — AB (ref 6–20)
CHLORIDE: 113 mmol/L — AB (ref 101–111)
CO2: 20 mmol/L — AB (ref 22–32)
CREATININE: 5.95 mg/dL — AB (ref 0.61–1.24)
Calcium: 9.1 mg/dL (ref 8.9–10.3)
GFR calc Af Amer: 9 mL/min — ABNORMAL LOW (ref >60)
GFR, EST NON AFRICAN AMERICAN: 8 mL/min — AB (ref >60)
Glucose, Bld: 108 mg/dL — ABNORMAL HIGH (ref 65–99)
POTASSIUM: 3.9 mmol/L (ref 3.5–5.1)
Phosphorus: 4.6 mg/dL (ref 2.5–4.6)
Sodium: 142 mmol/L (ref 135–145)

## 2016-10-20 LAB — POCT HEMOGLOBIN-HEMACUE: HEMOGLOBIN: 10.9 g/dL — AB (ref 13.0–17.0)

## 2016-10-20 MED ORDER — DARBEPOETIN ALFA 100 MCG/0.5ML IJ SOSY
100.0000 ug | PREFILLED_SYRINGE | Freq: Once | INTRAMUSCULAR | Status: AC
  Administered 2016-10-20: 100 ug via SUBCUTANEOUS
  Filled 2016-10-20: qty 0.5

## 2016-10-27 LAB — PTH, INTACT AND CALCIUM
CALCIUM TOTAL (PTH): 9.1 mg/dL
PTH: 136 pg/mL — ABNORMAL HIGH (ref 15–65)

## 2016-11-17 ENCOUNTER — Encounter (HOSPITAL_COMMUNITY)
Admission: RE | Admit: 2016-11-17 | Discharge: 2016-11-17 | Disposition: A | Discharge status: Home / Self Care | Payer: Medicare Other | Source: Ambulatory Visit | Attending: Nephrology | Admitting: Nephrology

## 2016-11-17 DIAGNOSIS — D631 Anemia in chronic kidney disease: Secondary | ICD-10-CM | POA: Insufficient documentation

## 2016-11-17 DIAGNOSIS — N184 Chronic kidney disease, stage 4 (severe): Secondary | ICD-10-CM | POA: Diagnosis present

## 2016-11-17 LAB — RENAL FUNCTION PANEL
Albumin: 4 g/dL (ref 3.5–5.0)
Anion gap: 8 (ref 5–15)
BUN: 42 mg/dL — AB (ref 6–20)
CALCIUM: 8.8 mg/dL — AB (ref 8.9–10.3)
CO2: 20 mmol/L — ABNORMAL LOW (ref 22–32)
CREATININE: 5.79 mg/dL — AB (ref 0.61–1.24)
Chloride: 109 mmol/L (ref 101–111)
GFR calc Af Amer: 9 mL/min — ABNORMAL LOW (ref >60)
GFR, EST NON AFRICAN AMERICAN: 8 mL/min — AB (ref >60)
GLUCOSE: 115 mg/dL — AB (ref 65–99)
PHOSPHORUS: 4.2 mg/dL (ref 2.5–4.6)
Potassium: 4 mmol/L (ref 3.5–5.1)
Sodium: 137 mmol/L (ref 135–145)

## 2016-11-17 LAB — IRON AND TIBC
Iron: 85 ug/dL (ref 45–182)
SATURATION RATIOS: 37 % (ref 17.9–39.5)
TIBC: 231 ug/dL — ABNORMAL LOW (ref 250–450)
UIBC: 146 ug/dL

## 2016-11-17 LAB — FERRITIN: Ferritin: 410 ng/mL — ABNORMAL HIGH (ref 24–336)

## 2016-11-17 LAB — POCT HEMOGLOBIN-HEMACUE: Hemoglobin: 10.5 g/dL — ABNORMAL LOW (ref 13.0–17.0)

## 2016-11-17 MED ORDER — DARBEPOETIN ALFA 100 MCG/0.5ML IJ SOSY
100.0000 ug | PREFILLED_SYRINGE | Freq: Once | INTRAMUSCULAR | Status: AC
  Administered 2016-11-17: 100 ug via SUBCUTANEOUS

## 2016-11-17 MED ORDER — DARBEPOETIN ALFA 100 MCG/0.5ML IJ SOSY
PREFILLED_SYRINGE | INTRAMUSCULAR | Status: AC
Start: 2016-11-17 — End: 2016-11-17
  Filled 2016-11-17: qty 0.5

## 2016-12-15 ENCOUNTER — Other Ambulatory Visit (HOSPITAL_COMMUNITY): Payer: Medicare Other

## 2016-12-15 ENCOUNTER — Ambulatory Visit (HOSPITAL_COMMUNITY): Payer: Medicare Other

## 2016-12-22 ENCOUNTER — Encounter (HOSPITAL_COMMUNITY)
Admission: RE | Admit: 2016-12-22 | Discharge: 2016-12-22 | Disposition: A | Discharge status: Home / Self Care | Payer: Medicare Other | Source: Ambulatory Visit | Attending: Nephrology | Admitting: Nephrology

## 2016-12-22 ENCOUNTER — Encounter (HOSPITAL_COMMUNITY): Payer: Self-pay

## 2016-12-22 DIAGNOSIS — D631 Anemia in chronic kidney disease: Secondary | ICD-10-CM | POA: Insufficient documentation

## 2016-12-22 DIAGNOSIS — N184 Chronic kidney disease, stage 4 (severe): Secondary | ICD-10-CM | POA: Diagnosis present

## 2016-12-22 LAB — RENAL FUNCTION PANEL
ALBUMIN: 3.7 g/dL (ref 3.5–5.0)
Anion gap: 12 (ref 5–15)
BUN: 45 mg/dL — ABNORMAL HIGH (ref 6–20)
CALCIUM: 9.3 mg/dL (ref 8.9–10.3)
CO2: 20 mmol/L — AB (ref 22–32)
CREATININE: 5.65 mg/dL — AB (ref 0.61–1.24)
Chloride: 110 mmol/L (ref 101–111)
GFR, EST AFRICAN AMERICAN: 10 mL/min — AB (ref >60)
GFR, EST NON AFRICAN AMERICAN: 8 mL/min — AB (ref >60)
Glucose, Bld: 112 mg/dL — ABNORMAL HIGH (ref 65–99)
PHOSPHORUS: 4.3 mg/dL (ref 2.5–4.6)
Potassium: 3.9 mmol/L (ref 3.5–5.1)
SODIUM: 142 mmol/L (ref 135–145)

## 2016-12-22 LAB — IRON AND TIBC
Iron: 77 ug/dL (ref 45–182)
Saturation Ratios: 32 % (ref 17.9–39.5)
TIBC: 241 ug/dL — AB (ref 250–450)
UIBC: 164 ug/dL

## 2016-12-22 LAB — POCT HEMOGLOBIN-HEMACUE: HEMOGLOBIN: 10.4 g/dL — AB (ref 13.0–17.0)

## 2016-12-22 MED ORDER — DARBEPOETIN ALFA 100 MCG/0.5ML IJ SOSY
100.0000 ug | PREFILLED_SYRINGE | INTRAMUSCULAR | Status: DC
  Administered 2016-12-22: 100 ug via SUBCUTANEOUS

## 2016-12-22 MED ORDER — DARBEPOETIN ALFA 100 MCG/0.5ML IJ SOSY
PREFILLED_SYRINGE | INTRAMUSCULAR | Status: AC
  Filled 2016-12-22: qty 0.5

## 2016-12-23 ENCOUNTER — Other Ambulatory Visit (HOSPITAL_COMMUNITY): Payer: Medicare Other

## 2016-12-23 ENCOUNTER — Ambulatory Visit (HOSPITAL_COMMUNITY): Payer: Medicare Other

## 2016-12-23 LAB — PTH, INTACT AND CALCIUM
Calcium, Total (PTH): 9.1 mg/dL (ref 8.6–10.2)
PTH: 107 pg/mL — ABNORMAL HIGH (ref 15–65)

## 2017-01-11 ENCOUNTER — Other Ambulatory Visit (HOSPITAL_COMMUNITY): Payer: Medicare Other

## 2017-01-11 ENCOUNTER — Ambulatory Visit (HOSPITAL_COMMUNITY): Payer: Medicare Other

## 2017-01-19 ENCOUNTER — Encounter (HOSPITAL_COMMUNITY)
Admission: RE | Admit: 2017-01-19 | Discharge: 2017-01-19 | Disposition: A | Discharge status: Home / Self Care | Payer: Medicare Other | Source: Ambulatory Visit | Attending: Nephrology | Admitting: Nephrology

## 2017-01-19 ENCOUNTER — Encounter (HOSPITAL_COMMUNITY): Payer: Medicare Other

## 2017-01-20 ENCOUNTER — Encounter (HOSPITAL_COMMUNITY): Payer: Medicare Other | Attending: Nephrology

## 2017-01-20 ENCOUNTER — Encounter (HOSPITAL_COMMUNITY): Payer: Medicare Other

## 2017-02-08 DIAGNOSIS — I129 Hypertensive chronic kidney disease with stage 1 through stage 4 chronic kidney disease, or unspecified chronic kidney disease: Secondary | ICD-10-CM | POA: Diagnosis not present

## 2017-02-08 DIAGNOSIS — N184 Chronic kidney disease, stage 4 (severe): Secondary | ICD-10-CM | POA: Diagnosis not present

## 2017-02-08 DIAGNOSIS — Z6830 Body mass index (BMI) 30.0-30.9, adult: Secondary | ICD-10-CM | POA: Diagnosis not present

## 2017-02-08 DIAGNOSIS — N2581 Secondary hyperparathyroidism of renal origin: Secondary | ICD-10-CM | POA: Diagnosis not present

## 2017-02-08 DIAGNOSIS — D631 Anemia in chronic kidney disease: Secondary | ICD-10-CM | POA: Diagnosis not present

## 2017-02-08 DIAGNOSIS — E1122 Type 2 diabetes mellitus with diabetic chronic kidney disease: Secondary | ICD-10-CM | POA: Diagnosis not present

## 2017-02-10 ENCOUNTER — Ambulatory Visit (HOSPITAL_COMMUNITY): Payer: Medicare Other

## 2017-02-10 ENCOUNTER — Encounter (HOSPITAL_COMMUNITY): Payer: Self-pay

## 2017-02-10 ENCOUNTER — Encounter (HOSPITAL_COMMUNITY)
Admission: RE | Admit: 2017-02-10 | Discharge: 2017-02-10 | Disposition: A | Discharge status: Home / Self Care | Payer: Medicare HMO | Source: Ambulatory Visit | Attending: Nephrology | Admitting: Nephrology

## 2017-02-10 DIAGNOSIS — D631 Anemia in chronic kidney disease: Secondary | ICD-10-CM | POA: Diagnosis present

## 2017-02-10 DIAGNOSIS — E1129 Type 2 diabetes mellitus with other diabetic kidney complication: Secondary | ICD-10-CM | POA: Diagnosis not present

## 2017-02-10 DIAGNOSIS — R410 Disorientation, unspecified: Secondary | ICD-10-CM | POA: Diagnosis not present

## 2017-02-10 DIAGNOSIS — N184 Chronic kidney disease, stage 4 (severe): Secondary | ICD-10-CM | POA: Insufficient documentation

## 2017-02-10 DIAGNOSIS — E1159 Type 2 diabetes mellitus with other circulatory complications: Secondary | ICD-10-CM | POA: Diagnosis not present

## 2017-02-10 DIAGNOSIS — Z6831 Body mass index (BMI) 31.0-31.9, adult: Secondary | ICD-10-CM | POA: Diagnosis not present

## 2017-02-10 DIAGNOSIS — I129 Hypertensive chronic kidney disease with stage 1 through stage 4 chronic kidney disease, or unspecified chronic kidney disease: Secondary | ICD-10-CM | POA: Diagnosis not present

## 2017-02-10 LAB — RENAL FUNCTION PANEL
ALBUMIN: 4.1 g/dL (ref 3.5–5.0)
Anion gap: 12 (ref 5–15)
BUN: 64 mg/dL — AB (ref 6–20)
CO2: 16 mmol/L — ABNORMAL LOW (ref 22–32)
CREATININE: 6.74 mg/dL — AB (ref 0.61–1.24)
Calcium: 8.9 mg/dL (ref 8.9–10.3)
Chloride: 108 mmol/L (ref 101–111)
GFR calc Af Amer: 8 mL/min — ABNORMAL LOW (ref >60)
GFR, EST NON AFRICAN AMERICAN: 7 mL/min — AB (ref >60)
GLUCOSE: 113 mg/dL — AB (ref 65–99)
POTASSIUM: 4.3 mmol/L (ref 3.5–5.1)
Phosphorus: 4.7 mg/dL — ABNORMAL HIGH (ref 2.5–4.6)
Sodium: 136 mmol/L (ref 135–145)

## 2017-02-10 LAB — POCT HEMOGLOBIN-HEMACUE: HEMOGLOBIN: 9.2 g/dL — AB (ref 13.0–17.0)

## 2017-02-10 MED ORDER — DARBEPOETIN ALFA 100 MCG/0.5ML IJ SOSY
100.0000 ug | PREFILLED_SYRINGE | INTRAMUSCULAR | Status: DC
  Administered 2017-02-10: 100 ug via SUBCUTANEOUS
  Filled 2017-02-10: qty 0.5

## 2017-02-11 DIAGNOSIS — M179 Osteoarthritis of knee, unspecified: Secondary | ICD-10-CM | POA: Insufficient documentation

## 2017-02-11 DIAGNOSIS — M1711 Unilateral primary osteoarthritis, right knee: Secondary | ICD-10-CM | POA: Diagnosis not present

## 2017-02-11 DIAGNOSIS — M1710 Unilateral primary osteoarthritis, unspecified knee: Secondary | ICD-10-CM | POA: Insufficient documentation

## 2017-02-11 LAB — PTH, INTACT AND CALCIUM
Calcium, Total (PTH): 9.1 mg/dL (ref 8.6–10.2)
PTH: 122 pg/mL — ABNORMAL HIGH (ref 15–65)

## 2017-02-23 DIAGNOSIS — M2040 Other hammer toe(s) (acquired), unspecified foot: Secondary | ICD-10-CM | POA: Diagnosis not present

## 2017-02-23 DIAGNOSIS — E119 Type 2 diabetes mellitus without complications: Secondary | ICD-10-CM | POA: Diagnosis not present

## 2017-02-23 DIAGNOSIS — L600 Ingrowing nail: Secondary | ICD-10-CM | POA: Diagnosis not present

## 2017-02-23 DIAGNOSIS — M79671 Pain in right foot: Secondary | ICD-10-CM | POA: Diagnosis not present

## 2017-02-23 DIAGNOSIS — M2010 Hallux valgus (acquired), unspecified foot: Secondary | ICD-10-CM | POA: Diagnosis not present

## 2017-03-01 DIAGNOSIS — M1711 Unilateral primary osteoarthritis, right knee: Secondary | ICD-10-CM | POA: Diagnosis not present

## 2017-03-04 DIAGNOSIS — M1711 Unilateral primary osteoarthritis, right knee: Secondary | ICD-10-CM | POA: Diagnosis not present

## 2017-03-08 DIAGNOSIS — M1711 Unilateral primary osteoarthritis, right knee: Secondary | ICD-10-CM | POA: Diagnosis not present

## 2017-03-10 ENCOUNTER — Encounter (HOSPITAL_COMMUNITY)
Admission: RE | Admit: 2017-03-10 | Discharge: 2017-03-10 | Disposition: A | Discharge status: Home / Self Care | Payer: Medicare HMO | Source: Ambulatory Visit | Attending: Nephrology | Admitting: Nephrology

## 2017-03-10 ENCOUNTER — Encounter (HOSPITAL_COMMUNITY): Payer: Self-pay

## 2017-03-10 DIAGNOSIS — N184 Chronic kidney disease, stage 4 (severe): Secondary | ICD-10-CM | POA: Insufficient documentation

## 2017-03-10 DIAGNOSIS — D631 Anemia in chronic kidney disease: Secondary | ICD-10-CM | POA: Diagnosis present

## 2017-03-10 LAB — IRON AND TIBC
IRON: 83 ug/dL (ref 45–182)
SATURATION RATIOS: 36 % (ref 17.9–39.5)
TIBC: 231 ug/dL — ABNORMAL LOW (ref 250–450)
UIBC: 148 ug/dL

## 2017-03-10 LAB — RENAL FUNCTION PANEL
Albumin: 4 g/dL (ref 3.5–5.0)
Anion gap: 11 (ref 5–15)
BUN: 42 mg/dL — ABNORMAL HIGH (ref 6–20)
CHLORIDE: 109 mmol/L (ref 101–111)
CO2: 20 mmol/L — AB (ref 22–32)
Calcium: 9.2 mg/dL (ref 8.9–10.3)
Creatinine, Ser: 6.08 mg/dL — ABNORMAL HIGH (ref 0.61–1.24)
GFR, EST AFRICAN AMERICAN: 9 mL/min — AB (ref >60)
GFR, EST NON AFRICAN AMERICAN: 8 mL/min — AB (ref >60)
Glucose, Bld: 124 mg/dL — ABNORMAL HIGH (ref 65–99)
POTASSIUM: 4.2 mmol/L (ref 3.5–5.1)
Phosphorus: 4.5 mg/dL (ref 2.5–4.6)
SODIUM: 140 mmol/L (ref 135–145)

## 2017-03-10 LAB — FERRITIN: Ferritin: 377 ng/mL — ABNORMAL HIGH (ref 24–336)

## 2017-03-10 LAB — POCT HEMOGLOBIN-HEMACUE: HEMOGLOBIN: 10.9 g/dL — AB (ref 13.0–17.0)

## 2017-03-10 MED ORDER — DARBEPOETIN ALFA 100 MCG/0.5ML IJ SOSY
PREFILLED_SYRINGE | INTRAMUSCULAR | Status: AC
  Filled 2017-03-10: qty 0.5

## 2017-03-10 MED ORDER — DARBEPOETIN ALFA 100 MCG/0.5ML IJ SOSY
100.0000 ug | PREFILLED_SYRINGE | INTRAMUSCULAR | Status: DC
  Administered 2017-03-10: 100 ug via SUBCUTANEOUS

## 2017-03-11 LAB — PTH, INTACT AND CALCIUM
Calcium, Total (PTH): 9.1 mg/dL (ref 8.6–10.2)
PTH: 96 pg/mL — AB (ref 15–65)

## 2017-03-25 DIAGNOSIS — M1711 Unilateral primary osteoarthritis, right knee: Secondary | ICD-10-CM | POA: Diagnosis not present

## 2017-03-29 DIAGNOSIS — M1711 Unilateral primary osteoarthritis, right knee: Secondary | ICD-10-CM | POA: Diagnosis not present

## 2017-03-31 DIAGNOSIS — M1711 Unilateral primary osteoarthritis, right knee: Secondary | ICD-10-CM | POA: Diagnosis not present

## 2017-04-04 DIAGNOSIS — N184 Chronic kidney disease, stage 4 (severe): Secondary | ICD-10-CM | POA: Diagnosis not present

## 2017-04-04 DIAGNOSIS — I129 Hypertensive chronic kidney disease with stage 1 through stage 4 chronic kidney disease, or unspecified chronic kidney disease: Secondary | ICD-10-CM | POA: Diagnosis not present

## 2017-04-04 DIAGNOSIS — D631 Anemia in chronic kidney disease: Secondary | ICD-10-CM | POA: Diagnosis not present

## 2017-04-04 DIAGNOSIS — N2581 Secondary hyperparathyroidism of renal origin: Secondary | ICD-10-CM | POA: Diagnosis not present

## 2017-04-04 DIAGNOSIS — Z6830 Body mass index (BMI) 30.0-30.9, adult: Secondary | ICD-10-CM | POA: Diagnosis not present

## 2017-04-04 DIAGNOSIS — E1122 Type 2 diabetes mellitus with diabetic chronic kidney disease: Secondary | ICD-10-CM | POA: Diagnosis not present

## 2017-04-07 ENCOUNTER — Encounter (HOSPITAL_COMMUNITY)
Admission: RE | Admit: 2017-04-07 | Discharge: 2017-04-07 | Disposition: A | Discharge status: Home / Self Care | Payer: Medicare HMO | Source: Ambulatory Visit | Attending: Nephrology | Admitting: Nephrology

## 2017-04-07 ENCOUNTER — Encounter (HOSPITAL_COMMUNITY): Payer: Self-pay

## 2017-04-07 ENCOUNTER — Other Ambulatory Visit: Payer: Self-pay

## 2017-04-07 DIAGNOSIS — N186 End stage renal disease: Secondary | ICD-10-CM

## 2017-04-07 DIAGNOSIS — D631 Anemia in chronic kidney disease: Secondary | ICD-10-CM | POA: Insufficient documentation

## 2017-04-07 DIAGNOSIS — N184 Chronic kidney disease, stage 4 (severe): Secondary | ICD-10-CM | POA: Diagnosis present

## 2017-04-07 LAB — RENAL FUNCTION PANEL
ALBUMIN: 3.7 g/dL (ref 3.5–5.0)
ANION GAP: 11 (ref 5–15)
BUN: 46 mg/dL — ABNORMAL HIGH (ref 6–20)
CALCIUM: 9.5 mg/dL (ref 8.9–10.3)
CO2: 21 mmol/L — AB (ref 22–32)
Chloride: 111 mmol/L (ref 101–111)
Creatinine, Ser: 6.17 mg/dL — ABNORMAL HIGH (ref 0.61–1.24)
GFR calc Af Amer: 9 mL/min — ABNORMAL LOW (ref >60)
GFR calc non Af Amer: 7 mL/min — ABNORMAL LOW (ref >60)
Glucose, Bld: 106 mg/dL — ABNORMAL HIGH (ref 65–99)
PHOSPHORUS: 4 mg/dL (ref 2.5–4.6)
Potassium: 4.2 mmol/L (ref 3.5–5.1)
SODIUM: 143 mmol/L (ref 135–145)

## 2017-04-07 LAB — IRON AND TIBC
IRON: 97 ug/dL (ref 45–182)
Saturation Ratios: 45 % — ABNORMAL HIGH (ref 17.9–39.5)
TIBC: 217 ug/dL — AB (ref 250–450)
UIBC: 120 ug/dL

## 2017-04-07 LAB — POCT HEMOGLOBIN-HEMACUE: Hemoglobin: 10.2 g/dL — ABNORMAL LOW (ref 13.0–17.0)

## 2017-04-07 LAB — FERRITIN: Ferritin: 536 ng/mL — ABNORMAL HIGH (ref 24–336)

## 2017-04-07 MED ORDER — DARBEPOETIN ALFA 100 MCG/0.5ML IJ SOSY
100.0000 ug | PREFILLED_SYRINGE | Freq: Once | INTRAMUSCULAR | Status: AC
  Administered 2017-04-07: 100 ug via SUBCUTANEOUS
  Filled 2017-04-07: qty 0.5

## 2017-04-08 LAB — PTH, INTACT AND CALCIUM
CALCIUM TOTAL (PTH): 9.5 mg/dL (ref 8.6–10.2)
PTH: 103 pg/mL — ABNORMAL HIGH (ref 15–65)

## 2017-04-11 ENCOUNTER — Other Ambulatory Visit: Payer: Self-pay

## 2017-04-11 ENCOUNTER — Ambulatory Visit: Payer: Medicare HMO | Admitting: Neurology

## 2017-04-11 ENCOUNTER — Encounter: Payer: Self-pay | Admitting: Neurology

## 2017-04-11 ENCOUNTER — Other Ambulatory Visit: Payer: Self-pay | Admitting: *Deleted

## 2017-04-11 ENCOUNTER — Telehealth: Payer: Self-pay | Admitting: Neurology

## 2017-04-11 VITALS — BP 135/77 | HR 64 | Ht 65.0 in | Wt 175.5 lb

## 2017-04-11 DIAGNOSIS — M2040 Other hammer toe(s) (acquired), unspecified foot: Secondary | ICD-10-CM | POA: Insufficient documentation

## 2017-04-11 DIAGNOSIS — E538 Deficiency of other specified B group vitamins: Secondary | ICD-10-CM | POA: Diagnosis not present

## 2017-04-11 DIAGNOSIS — E119 Type 2 diabetes mellitus without complications: Secondary | ICD-10-CM | POA: Insufficient documentation

## 2017-04-11 DIAGNOSIS — R413 Other amnesia: Secondary | ICD-10-CM | POA: Diagnosis not present

## 2017-04-11 DIAGNOSIS — B351 Tinea unguium: Secondary | ICD-10-CM | POA: Insufficient documentation

## 2017-04-11 NOTE — Progress Notes (Signed)
Reason for visit: Memory disturbance  Referring physician: Dr. Currie Tucker is a 84 y.o. male  History of present illness:  Miguel Tucker is an 84 year old right-handed black male with a history of hypertension and diabetes.  The patient has developed end-stage renal disease, he is about ready to start hemodialysis at any time.  His most recent BUN and creatinine are 46/6.17.  Over the last 2 years the patient has apparently had some troubles with memory.  Unfortunately, he comes to the office today by himself.  The patient lives with his wife.  He has not noted that he has a memory problem mainly because he has a tendency to misplace things about the house.  He is trying to get more organized to avoid this issue.  He still operates a [employer], he denies any issues with safety with driving or difficulty with getting lost.  The patient has had some problems with managing finances, his son lives in [place] and will come down to visit off and on and check the finances.  He has made mistakes paying the bills.  His son has tried to set him up with electronic billing so that the bills are paid automatically.  The patient is able to keep up with his medications and appointments fairly well.  The patient does not cook.  He denies any difficulty with numbness or weakness, he does have arthritis of the right knee and will limp a bit when he tries to walk.  He denies issues controlling the bowels or the bladder.  He claims that he sleeps well at night and has a good energy level during the daytime.  Given the memory problems, he is sent to this office for an evaluation.  Past Medical History:  Diagnosis Date  . Asthma   . Chronic kidney disease   . Diabetes mellitus    type 2  . Frequent urination at night   . Gout   . Hard of hearing   . High cholesterol   . Hypertension   . Hypokalemia     Past Surgical History:  Procedure Laterality Date  . APPENDECTOMY    .  BASCILIC VEIN TRANSPOSITION Left 09/09/2015   Procedure: FIRST STAGE BRACHIAL VEIN TRANSPOSITION LEFT ARM;  Surgeon: Elam Dutch, MD;  Location: [facility];  Service: Vascular;  Laterality: Left;  . BASCILIC VEIN TRANSPOSITION Left 02/20/2016   Procedure: LEFT ARM SECOND STAGE BASILIC VEIN TRANSPOSITION;  Surgeon: Rosetta Posner, MD;  Location: [facility];  Service: Vascular;  Laterality: Left;  . OTHER SURGICAL HISTORY     testicular tumor- benign   . ROTATOR CUFF REPAIR Right   . WOUND DEBRIDEMENT  06/01/2011   Procedure: DEBRIDEMENT ABDOMINAL WOUND;  Surgeon: [name], MD;  Location: [facility];  Service: General;  Laterality: N/A;  Remove Sutures     History reviewed. No pertinent family history.  Social history:  reports that he quit smoking about 41 years ago. He has never used smokeless tobacco. He reports that he does not drink alcohol or use drugs.  Medications:  Prior to Admission medications   Medication Sig Start Date End Date Taking? Authorizing Provider  allopurinol (ZYLOPRIM) 100 MG tablet Take 100 mg by mouth daily.  07/23/15  Yes [provider]  amLODipine (NORVASC) 10 MG tablet Take 10 mg by mouth every evening.   Yes [provider]  calcitRIOL (ROCALTROL) 0.25 MCG capsule Take 0.25 mcg by mouth daily.  07/23/15  Yes [provider]  ferrous sulfate 325 (65 FE) MG tablet Take 325 mg by mouth daily with breakfast.   Yes [provider]  finasteride (PROSCAR) 5 MG tablet Take 5 mg by mouth daily.   Yes [provider]  furosemide (LASIX) 40 MG tablet Take 40 mg by mouth daily.  07/24/15  Yes [provider]  labetalol (NORMODYNE) 200 MG tablet Take 200 mg by mouth 2 (two) times daily.  07/23/15  Yes [provider]  montelukast (SINGULAIR) 10 MG tablet Take 10 mg by mouth every morning.    Yes [provider]  aspirin EC 81 MG tablet Take 81 mg by mouth every morning.     [provider]  valsartan  (DIOVAN) 160 MG tablet Take 160 mg by mouth 2 (two) times daily. 07/10/15   [provider]      Allergies  Allergen Reactions  . Penicillins Itching and Other (See Comments)    On arms only. Has patient had a PCN reaction causing immediate rash, facial/tongue/throat swelling, SOB or lightheadedness with hypotension: No Has patient had a PCN reaction causing severe rash involving mucus membranes or skin necrosis: No Has patient had a PCN reaction that required hospitalization No Has patient had a PCN reaction occurring within the last 10 years: No If all of the above answers are "NO", then may proceed with Cephalosporin use.     ROS:  Out of a complete 14 system review of symptoms, the patient complains only of the following symptoms, and all other reviewed systems are negative.  Fatigue Hearing loss Urination problems, urinary incontinence Lymph node swelling Increased thirst, flushing Joint pain, aching muscles Runny nose Memory loss  Blood pressure 135/77, pulse 64, height 5\' 5"  (1.651 m), weight 175 lb 8 oz (79.6 kg).  Physical Exam  General: The patient is alert and cooperative at the time of the examination.  The patient is moderately obese.  Eyes: Pupils are equal, round, and reactive to light. Discs are flat bilaterally.  Neck: The neck is supple, no carotid bruits are noted.  Respiratory: The respiratory examination is clear.  Cardiovascular: The cardiovascular examination reveals a regular rate and rhythm, no obvious murmurs or rubs are noted.  Skin: Extremities are without significant edema.  Neurologic Exam  Mental status: The patient is alert and oriented x 2 at the time of the examination (not oriented to date). Mini-Mental status examination done today shows a total score of 20/30.  Cranial nerves: Facial symmetry is present. There is good sensation of the face to pinprick and soft touch bilaterally. The strength of the facial muscles and the  muscles to head turning and shoulder shrug are normal bilaterally. Speech is well enunciated, no aphasia or dysarthria is noted. Extraocular movements are full. Visual fields are full. The tongue is midline, and the patient has symmetric elevation of the soft palate. No obvious hearing deficits are noted.  Motor: The motor testing reveals 5 over 5 strength of all 4 extremities. Good symmetric motor tone is noted throughout.  Sensory: Sensory testing is intact to pinprick, soft touch, vibration sensation, and position sense on all 4 extremities. No evidence of extinction is noted.  Coordination: Cerebellar testing reveals good finger-nose-finger and heel-to-shin bilaterally.  Gait and station: Gait is normal. Tandem gait is slightly unsteady. Romberg is negative. No drift is seen.  Reflexes: Deep tendon reflexes are symmetric, but are depressed bilaterally. Toes are downgoing bilaterally.   Assessment/Plan:  1.  Memory disturbance  2.  Stage IV chronic renal insufficiency  The patient does have risk factors for cerebrovascular disease, CT scan of the brain will be done.  The patient will have blood work done today.  We will consider the use of a medication such as Aricept for memory the future.  The patient will follow-up in 6 months.  Jill Alexanders MD 04/11/2017 11:46 AM  [facility] [address] [place], [place] [address]  Phone [phone] Fax [phone]

## 2017-04-11 NOTE — Patient Instructions (Signed)
   We will get a CT of the brain. If you desire to go on a medication for memory, we can start something in the near future.

## 2017-04-11 NOTE — Telephone Encounter (Signed)
[employer] medicare order sent to [facility] . They obtain the auth and will contact the pt to schedule

## 2017-04-12 ENCOUNTER — Telehealth: Payer: Self-pay | Admitting: *Deleted

## 2017-04-12 DIAGNOSIS — M1711 Unilateral primary osteoarthritis, right knee: Secondary | ICD-10-CM | POA: Diagnosis not present

## 2017-04-12 LAB — VITAMIN B12: VITAMIN B 12: 306 pg/mL (ref 232–1245)

## 2017-04-12 LAB — RPR: RPR Ser Ql: NONREACTIVE

## 2017-04-12 LAB — SEDIMENTATION RATE: SED RATE: 19 mm/h (ref 0–30)

## 2017-04-12 NOTE — Telephone Encounter (Signed)
-----   Message from Kathrynn Ducking, MD sent at 04/12/2017  7:14 AM EDT -----  The blood work results are unremarkable. Please call the patient.  ----- Message ----- From: Lavone Neri Lab Results In Sent: 04/12/2017   5:41 AM To: Kathrynn Ducking, MD

## 2017-04-12 NOTE — Telephone Encounter (Signed)
Called and spoke with patient about unremarkable labs per CW,MD note. Pt verbalized understanding.  

## 2017-04-13 NOTE — Telephone Encounter (Signed)
[employer] medicare Josem Kaufmann: [id number] (exp. 04/12/17 to 07/11/17) pt is scheduled at [facility] for 04/18/17.

## 2017-04-14 DIAGNOSIS — M1711 Unilateral primary osteoarthritis, right knee: Secondary | ICD-10-CM | POA: Diagnosis not present

## 2017-04-18 ENCOUNTER — Other Ambulatory Visit: Payer: Medicare HMO

## 2017-04-25 DIAGNOSIS — M5431 Sciatica, right side: Secondary | ICD-10-CM | POA: Diagnosis not present

## 2017-04-25 DIAGNOSIS — M48061 Spinal stenosis, lumbar region without neurogenic claudication: Secondary | ICD-10-CM | POA: Diagnosis not present

## 2017-04-26 DIAGNOSIS — M1711 Unilateral primary osteoarthritis, right knee: Secondary | ICD-10-CM | POA: Diagnosis not present

## 2017-04-27 ENCOUNTER — Other Ambulatory Visit: Payer: Self-pay | Admitting: Orthopedic Surgery

## 2017-04-27 DIAGNOSIS — M5431 Sciatica, right side: Secondary | ICD-10-CM

## 2017-04-28 ENCOUNTER — Ambulatory Visit
Admission: RE | Admit: 2017-04-28 | Discharge: 2017-04-28 | Disposition: A | Discharge status: Home / Self Care | Payer: Medicare HMO | Source: Ambulatory Visit | Attending: Neurology | Admitting: Neurology

## 2017-04-28 DIAGNOSIS — R413 Other amnesia: Secondary | ICD-10-CM | POA: Diagnosis not present

## 2017-04-29 NOTE — Progress Notes (Signed)
Recent head CT wo contrast shows overall a normal structure of the brain and nothing acute. There was volume loss which we call atrophy. There were changes in the deeper structures of the brain, which we call white matter changes or microvascular changes. These were reported as moderate in His case. These are tiny white spots, that occur with time and are seen in a variety of conditions, including with normal aging, chronic hypertension, chronic headaches, especially migraine HAs, chronic diabetes, chronic hyperlipidemia.  Miguel Tucker

## 2017-05-02 ENCOUNTER — Telehealth: Payer: Self-pay | Admitting: *Deleted

## 2017-05-02 NOTE — Telephone Encounter (Signed)
Called and spoke with niece, Sunday Spillers about CT head results per Dr. Rexene Alberts note. Advised Dr. Jannifer Franklin out of office. Advised he should continue to f/u with PCP to manage BP, cholesterol, diabetes. She wanted me to contact daughter in law but advised I could not since she was not on DPR form. She will have son who is on DPR (her husband) call me. She wants me to go through those results w/ him as well. Nothing further needed.

## 2017-05-02 NOTE — Telephone Encounter (Signed)
Kiril Hippe (son) called who is on DPR. I went over results again with him. He verbalized understanding. Nothing further needed. He will make sure father is following up with PCP regularly.

## 2017-05-02 NOTE — Telephone Encounter (Signed)
-----   Message from Star Age, MD sent at 04/29/2017  2:58 PM EDT ----- Recent head CT wo contrast shows overall a normal structure of the brain and nothing acute. There was volume loss which we call atrophy. There were changes in the deeper structures of the brain, which we call white matter changes or microvascular changes. These were reported as moderate in His case. These are tiny white spots, that occur with time and are seen in a variety of conditions, including with normal aging, chronic hypertension, chronic headaches, especially migraine HAs, chronic diabetes, chronic hyperlipidemia.  Miguel Tucker

## 2017-05-03 DIAGNOSIS — M1711 Unilateral primary osteoarthritis, right knee: Secondary | ICD-10-CM | POA: Diagnosis not present

## 2017-05-05 ENCOUNTER — Encounter (HOSPITAL_COMMUNITY)
Admission: RE | Admit: 2017-05-05 | Discharge: 2017-05-05 | Disposition: A | Discharge status: Home / Self Care | Payer: Medicare HMO | Source: Ambulatory Visit | Attending: Nephrology | Admitting: Nephrology

## 2017-05-05 DIAGNOSIS — D631 Anemia in chronic kidney disease: Secondary | ICD-10-CM | POA: Insufficient documentation

## 2017-05-05 DIAGNOSIS — N184 Chronic kidney disease, stage 4 (severe): Secondary | ICD-10-CM | POA: Diagnosis present

## 2017-05-05 LAB — IRON AND TIBC
Iron: 70 ug/dL (ref 45–182)
SATURATION RATIOS: 33 % (ref 17.9–39.5)
TIBC: 213 ug/dL — AB (ref 250–450)
UIBC: 143 ug/dL

## 2017-05-05 LAB — POCT HEMOGLOBIN-HEMACUE: Hemoglobin: 10.1 g/dL — ABNORMAL LOW (ref 13.0–17.0)

## 2017-05-05 LAB — RENAL FUNCTION PANEL
ALBUMIN: 3.5 g/dL (ref 3.5–5.0)
ANION GAP: 10 (ref 5–15)
BUN: 61 mg/dL — ABNORMAL HIGH (ref 6–20)
CALCIUM: 8.8 mg/dL — AB (ref 8.9–10.3)
CO2: 19 mmol/L — ABNORMAL LOW (ref 22–32)
Chloride: 111 mmol/L (ref 101–111)
Creatinine, Ser: 5.29 mg/dL — ABNORMAL HIGH (ref 0.61–1.24)
GFR calc Af Amer: 10 mL/min — ABNORMAL LOW (ref >60)
GFR, EST NON AFRICAN AMERICAN: 9 mL/min — AB (ref >60)
Glucose, Bld: 167 mg/dL — ABNORMAL HIGH (ref 65–99)
PHOSPHORUS: 3.3 mg/dL (ref 2.5–4.6)
POTASSIUM: 3.7 mmol/L (ref 3.5–5.1)
SODIUM: 140 mmol/L (ref 135–145)

## 2017-05-05 LAB — FERRITIN: Ferritin: 473 ng/mL — ABNORMAL HIGH (ref 24–336)

## 2017-05-05 MED ORDER — DARBEPOETIN ALFA 100 MCG/0.5ML IJ SOSY
PREFILLED_SYRINGE | INTRAMUSCULAR | Status: AC
  Filled 2017-05-05: qty 0.5

## 2017-05-05 MED ORDER — DARBEPOETIN ALFA 100 MCG/0.5ML IJ SOSY
100.0000 ug | PREFILLED_SYRINGE | Freq: Once | INTRAMUSCULAR | Status: AC
  Administered 2017-05-05: 100 ug via SUBCUTANEOUS

## 2017-05-06 LAB — PTH, INTACT AND CALCIUM
CALCIUM TOTAL (PTH): 8.6 mg/dL (ref 8.6–10.2)
PTH: 113 pg/mL — AB (ref 15–65)

## 2017-05-09 ENCOUNTER — Ambulatory Visit
Admission: RE | Admit: 2017-05-09 | Discharge: 2017-05-09 | Disposition: A | Discharge status: Home / Self Care | Payer: Medicare HMO | Source: Ambulatory Visit | Attending: Orthopedic Surgery | Admitting: Orthopedic Surgery

## 2017-05-09 DIAGNOSIS — M48061 Spinal stenosis, lumbar region without neurogenic claudication: Secondary | ICD-10-CM | POA: Diagnosis not present

## 2017-05-09 DIAGNOSIS — M5431 Sciatica, right side: Secondary | ICD-10-CM

## 2017-05-09 MED ORDER — IOPAMIDOL (ISOVUE-M 200) INJECTION 41%
15.0000 mL | Freq: Once | INTRAMUSCULAR | Status: AC
  Administered 2017-05-09: 15 mL via INTRATHECAL

## 2017-05-09 MED ORDER — DIAZEPAM 5 MG PO TABS
5.0000 mg | ORAL_TABLET | Freq: Once | ORAL | Status: AC
  Administered 2017-05-09: 5 mg via ORAL

## 2017-05-09 MED ORDER — ONDANSETRON HCL 4 MG/2ML IJ SOLN: 4.0000 mg | Freq: Four times a day (QID) | INTRAMUSCULAR | Status: DC | PRN

## 2017-05-09 NOTE — Discharge Instructions (Signed)

## 2017-05-10 ENCOUNTER — Other Ambulatory Visit: Payer: Medicare HMO

## 2017-05-12 ENCOUNTER — Other Ambulatory Visit (HOSPITAL_COMMUNITY): Payer: Medicare HMO

## 2017-05-12 ENCOUNTER — Encounter: Payer: Medicare HMO | Admitting: Vascular Surgery

## 2017-05-12 ENCOUNTER — Encounter (HOSPITAL_COMMUNITY): Payer: Medicare HMO

## 2017-05-20 DIAGNOSIS — M1711 Unilateral primary osteoarthritis, right knee: Secondary | ICD-10-CM | POA: Diagnosis not present

## 2017-05-26 DIAGNOSIS — L600 Ingrowing nail: Secondary | ICD-10-CM | POA: Diagnosis not present

## 2017-05-26 DIAGNOSIS — M79671 Pain in right foot: Secondary | ICD-10-CM | POA: Diagnosis not present

## 2017-05-26 DIAGNOSIS — M2010 Hallux valgus (acquired), unspecified foot: Secondary | ICD-10-CM | POA: Diagnosis not present

## 2017-05-26 DIAGNOSIS — E119 Type 2 diabetes mellitus without complications: Secondary | ICD-10-CM | POA: Diagnosis not present

## 2017-05-26 DIAGNOSIS — M7730 Calcaneal spur, unspecified foot: Secondary | ICD-10-CM | POA: Diagnosis not present

## 2017-05-26 DIAGNOSIS — E559 Vitamin D deficiency, unspecified: Secondary | ICD-10-CM | POA: Diagnosis not present

## 2017-05-26 DIAGNOSIS — M2040 Other hammer toe(s) (acquired), unspecified foot: Secondary | ICD-10-CM | POA: Diagnosis not present

## 2017-05-27 DIAGNOSIS — M1711 Unilateral primary osteoarthritis, right knee: Secondary | ICD-10-CM | POA: Diagnosis not present

## 2017-06-06 ENCOUNTER — Encounter (HOSPITAL_COMMUNITY)
Admission: RE | Admit: 2017-06-06 | Discharge: 2017-06-06 | Disposition: A | Discharge status: Home / Self Care | Payer: Medicare HMO | Source: Ambulatory Visit | Attending: Nephrology | Admitting: Nephrology

## 2017-06-06 ENCOUNTER — Encounter (HOSPITAL_COMMUNITY): Payer: Self-pay

## 2017-06-06 DIAGNOSIS — N184 Chronic kidney disease, stage 4 (severe): Secondary | ICD-10-CM | POA: Diagnosis present

## 2017-06-06 DIAGNOSIS — D631 Anemia in chronic kidney disease: Secondary | ICD-10-CM | POA: Insufficient documentation

## 2017-06-06 LAB — RENAL FUNCTION PANEL
Albumin: 3.9 g/dL (ref 3.5–5.0)
Anion gap: 9 (ref 5–15)
BUN: 38 mg/dL — AB (ref 6–20)
CHLORIDE: 110 mmol/L (ref 101–111)
CO2: 22 mmol/L (ref 22–32)
CREATININE: 6.2 mg/dL — AB (ref 0.61–1.24)
Calcium: 9.7 mg/dL (ref 8.9–10.3)
GFR calc Af Amer: 8 mL/min — ABNORMAL LOW (ref >60)
GFR, EST NON AFRICAN AMERICAN: 7 mL/min — AB (ref >60)
GLUCOSE: 98 mg/dL (ref 65–99)
Phosphorus: 3.9 mg/dL (ref 2.5–4.6)
Potassium: 3.8 mmol/L (ref 3.5–5.1)
Sodium: 141 mmol/L (ref 135–145)

## 2017-06-06 LAB — IRON AND TIBC
Iron: 58 ug/dL (ref 45–182)
Saturation Ratios: 24 % (ref 17.9–39.5)
TIBC: 241 ug/dL — ABNORMAL LOW (ref 250–450)
UIBC: 183 ug/dL

## 2017-06-06 LAB — POCT HEMOGLOBIN-HEMACUE: HEMOGLOBIN: 11.4 g/dL — AB (ref 13.0–17.0)

## 2017-06-06 LAB — FERRITIN: Ferritin: 749 ng/mL — ABNORMAL HIGH (ref 24–336)

## 2017-06-06 MED ORDER — DARBEPOETIN ALFA 100 MCG/0.5ML IJ SOSY
100.0000 ug | PREFILLED_SYRINGE | INTRAMUSCULAR | Status: DC
  Administered 2017-06-06: 100 ug via SUBCUTANEOUS
  Filled 2017-06-06: qty 0.5

## 2017-06-07 LAB — PTH, INTACT AND CALCIUM
Calcium, Total (PTH): 9.8 mg/dL (ref 8.6–10.2)
PTH: 69 pg/mL — AB (ref 15–65)

## 2017-06-10 DIAGNOSIS — M1711 Unilateral primary osteoarthritis, right knee: Secondary | ICD-10-CM | POA: Diagnosis not present

## 2017-06-22 ENCOUNTER — Encounter (HOSPITAL_COMMUNITY): Payer: Self-pay | Admitting: Emergency Medicine

## 2017-06-22 ENCOUNTER — Emergency Department (HOSPITAL_COMMUNITY)
Admission: EM | Admit: 2017-06-22 | Discharge: 2017-06-22 | Disposition: A | Discharge status: Home / Self Care | Payer: Medicare HMO | Attending: Emergency Medicine | Admitting: Emergency Medicine

## 2017-06-22 ENCOUNTER — Emergency Department (HOSPITAL_COMMUNITY): Payer: Medicare HMO

## 2017-06-22 ENCOUNTER — Other Ambulatory Visit: Payer: Self-pay

## 2017-06-22 DIAGNOSIS — Z7982 Long term (current) use of aspirin: Secondary | ICD-10-CM | POA: Insufficient documentation

## 2017-06-22 DIAGNOSIS — J45909 Unspecified asthma, uncomplicated: Secondary | ICD-10-CM | POA: Insufficient documentation

## 2017-06-22 DIAGNOSIS — Z87891 Personal history of nicotine dependence: Secondary | ICD-10-CM | POA: Diagnosis not present

## 2017-06-22 DIAGNOSIS — I129 Hypertensive chronic kidney disease with stage 1 through stage 4 chronic kidney disease, or unspecified chronic kidney disease: Secondary | ICD-10-CM | POA: Insufficient documentation

## 2017-06-22 DIAGNOSIS — Z79899 Other long term (current) drug therapy: Secondary | ICD-10-CM | POA: Insufficient documentation

## 2017-06-22 DIAGNOSIS — N189 Chronic kidney disease, unspecified: Secondary | ICD-10-CM | POA: Insufficient documentation

## 2017-06-22 DIAGNOSIS — E1122 Type 2 diabetes mellitus with diabetic chronic kidney disease: Secondary | ICD-10-CM | POA: Diagnosis not present

## 2017-06-22 DIAGNOSIS — G8929 Other chronic pain: Secondary | ICD-10-CM

## 2017-06-22 DIAGNOSIS — M25561 Pain in right knee: Secondary | ICD-10-CM | POA: Insufficient documentation

## 2017-06-22 MED ORDER — PREDNISONE 20 MG PO TABS: 20.0000 mg | ORAL_TABLET | Freq: Every day | ORAL | 0 refills | Status: DC

## 2017-06-22 MED ORDER — TRAMADOL HCL 50 MG PO TABS: 50.0000 mg | ORAL_TABLET | Freq: Four times a day (QID) | ORAL | 0 refills | Status: DC | PRN

## 2017-06-22 MED ORDER — ADJUSTABLE ALUMINUM CANE MISC: 0 refills | Status: DC

## 2017-06-22 NOTE — ED Notes (Signed)
Knee sleeve applied

## 2017-06-22 NOTE — ED Notes (Signed)
Pt to xray

## 2017-06-22 NOTE — Discharge Instructions (Signed)
You may wear the knee brace as needed for walking or standing, do not wear continuously or at bedtime.  Call Dr. Anne Fu office to arrange a follow-up appointment.  Return to the ER for any worsening symptoms such as redness, increasing pain, or swelling.

## 2017-06-22 NOTE — ED Triage Notes (Signed)
Pt c/o of right knee pain x3 days.  No swelling and denies injuries or falls

## 2017-06-23 NOTE — ED Provider Notes (Signed)
[facility] EMERGENCY DEPARTMENT Provider Note   CSN: [id number] Arrival date & time: 06/22/17  1306     History   Chief Complaint Chief Complaint  Patient presents with  . Knee Pain    HPI Miguel Tucker is a 84 y.o. male.  HPI  Miguel Tucker is a 84 y.o. male with hx of OA of the right knee, presents to the Emergency Department complaining of worsening of his chronic right knee pain.  Symptoms worsening for 3 days.  He reports recently having a series of cortisone injections to the knee which have not provided relief of his pain.  He has also tried tylenol without relief.  Pain worse with weight bearing ad bending.  He denies redness, excessive warmth, increased swelling and recent injury.   Past Medical History:  Diagnosis Date  . Asthma   . Chronic kidney disease   . Diabetes mellitus    type 2  . Frequent urination at night   . Gout   . Hard of hearing   . High cholesterol   . Hypertension   . Hypokalemia     Patient Active Problem List   Diagnosis Date Noted  . Onychomycosis 04/11/2017  . Hammer toe 04/11/2017  . Diabetes mellitus ([place]) 04/11/2017  . Osteoarthritis of knee 02/11/2017  . Difficulty in walking(719.7) 12/09/2011  . Radicular pain of left lower extremity 12/09/2011  . Wound healing, delayed 11/06/2010    Past Surgical History:  Procedure Laterality Date  . APPENDECTOMY    . BASCILIC VEIN TRANSPOSITION Left 09/09/2015   Procedure: FIRST STAGE BRACHIAL VEIN TRANSPOSITION LEFT ARM;  Surgeon: Elam Dutch, MD;  Location: [place];  Service: Vascular;  Laterality: Left;  . BASCILIC VEIN TRANSPOSITION Left 02/20/2016   Procedure: LEFT ARM SECOND STAGE BASILIC VEIN TRANSPOSITION;  Surgeon: Rosetta Posner, MD;  Location: [facility];  Service: Vascular;  Laterality: Left;  . OTHER SURGICAL HISTORY     testicular tumor- benign   . ROTATOR CUFF REPAIR Right   . WOUND DEBRIDEMENT  06/01/2011   Procedure: DEBRIDEMENT ABDOMINAL WOUND;  Surgeon: [name], MD;  Location: [facility];  Service: General;  Laterality: N/A;  Remove Sutures         Home Medications    Prior to Admission medications   Medication Sig Start Date End Date Taking? Authorizing Provider  allopurinol (ZYLOPRIM) 100 MG tablet Take 100 mg by mouth daily.  07/23/15  Yes [provider]  amLODipine (NORVASC) 10 MG tablet Take 10 mg by mouth every evening.   Yes [provider]  aspirin EC 81 MG tablet Take 81 mg by mouth every morning.    Yes [provider]  calcitRIOL (ROCALTROL) 0.25 MCG capsule Take 0.25 mcg by mouth daily.  07/23/15  Yes [provider]  ferrous sulfate 325 (65 FE) MG tablet Take 325 mg by mouth daily with breakfast.   Yes [provider]  finasteride (PROSCAR) 5 MG tablet Take 5 mg by mouth daily.   Yes [provider]  furosemide (LASIX) 40 MG tablet Take 40 mg by mouth daily.  07/24/15  Yes [provider]  labetalol (NORMODYNE) 200 MG tablet Take 200 mg by mouth 2 (two) times daily.  07/23/15  Yes [provider]  valsartan (DIOVAN) 160 MG tablet Take 160 mg by mouth 2 (two) times daily. 07/10/15  Yes [provider]  [place]. Devices (ADJUSTABLE ALUMINUM CANE) MISC Use the cane as needed for weight  bearing 06/22/17   Diangelo Radel, PA-C  predniSONE (DELTASONE) 20 MG tablet Take 1 tablet (20 mg total) by mouth daily. For 5 days 06/22/17   Alyxander Kollmann, PA-C  traMADol (ULTRAM) 50 MG tablet Take 1 tablet (50 mg total) by mouth every 6 (six) hours as needed. 06/22/17   Kem Parkinson, PA-C    Family History History reviewed. No pertinent family history.  Social History Social History   Tobacco Use  . Smoking status: Former Smoker    Last attempt to quit: 11/06/1975    Years since quitting: 41.6  . Smokeless tobacco: Never Used  Substance Use Topics  . Alcohol use: No  . Drug use: No     Allergies   Penicillins   Review of Systems Review of Systems    Constitutional: Negative for chills and fever.  Respiratory: Negative for shortness of breath.   Cardiovascular: Negative for chest pain and leg swelling.  Musculoskeletal: Positive for arthralgias (right knee pain). Negative for joint swelling.  Skin: Negative for color change and wound.  Neurological: Negative for weakness and numbness.  All other systems reviewed and are negative.    Physical Exam Updated Vital Signs BP (!) 189/92 (BP Location: Right Arm)   Pulse 89   Temp 98.5 F (36.9 C) (Oral)   Resp 16   Ht 5\' 6"  (1.676 m)   Wt 81.6 kg (180 lb)   SpO2 95%   BMI 29.05 kg/m   Physical Exam  Constitutional: He appears well-developed and well-nourished. No distress.  HENT:  Head: Atraumatic.  Cardiovascular: Normal rate, regular rhythm and intact distal pulses.  Pulmonary/Chest: Effort normal and breath sounds normal.  Musculoskeletal: He exhibits tenderness. He exhibits no deformity.  ttp of the anterior right knee.  No erythema, effusion, or step-off deformity.  Neg drawer sign.  Calf is soft and NT.  Neurological: He is alert. No sensory deficit.  Skin: Skin is warm. Capillary refill takes less than 2 seconds. No rash noted. No erythema.  Nursing note and vitals reviewed.    ED Treatments / Results  Labs (all labs ordered are listed, but only abnormal results are displayed) Labs Reviewed - No data to display  EKG None  Radiology Dg Knee Complete 4 Views Right  Result Date: 06/22/2017 CLINICAL DATA:  Right knee pain for 3 weeks. EXAM: RIGHT KNEE - COMPLETE 4+ VIEW COMPARISON:  Radiographs of October 01, 2016. FINDINGS: No evidence of fracture, dislocation, or joint effusion. Severe narrowing of medial joint space is noted. Chondrocalcinosis is noted laterally. Soft tissues are unremarkable. IMPRESSION: Severe degenerative joint disease. No acute abnormality seen in the right knee. Electronically Signed   By: Marijo Conception, [name]   On: 06/22/2017 13:51     Procedures Procedures (including critical care time)  Medications Ordered in ED Medications - No data to display   Initial Impression / Assessment and Plan / ED Course  I have reviewed the triage vital signs and the nursing notes.  Pertinent labs & imaging results that were available during my care of the patient were reviewed by me and considered in my medical decision making (see chart for details).     Pt with likely acute on chronic knee pain.  Hx of OA of the knee.  No concerning sx's for septic joint.  NV intact.   Knee sleeve applied. rx written for cane and short course of medication for pain and oral steroid.  Pt agrees to arrange f/u with his orthopedist.  Discussed  findings with Dr. Roderic Palau.  Return precautions discussed with pt.   Final Clinical Impressions(s) / ED Diagnoses   Final diagnoses:  Chronic pain of right knee    ED Discharge Orders        Ordered    predniSONE (DELTASONE) 20 MG tablet  Daily     06/22/17 1421    traMADol (ULTRAM) 50 MG tablet  Every 6 hours PRN     06/22/17 1421    Misc. Devices (ADJUSTABLE ALUMINUM CANE) MISC     06/22/17 1421       Kem Parkinson, PA-C 06/23/17 2237    Milton Ferguson, MD 06/23/17 [phone]

## 2017-06-30 ENCOUNTER — Other Ambulatory Visit (HOSPITAL_COMMUNITY): Payer: Medicare HMO

## 2017-06-30 ENCOUNTER — Encounter: Payer: Medicare HMO | Admitting: Vascular Surgery

## 2017-06-30 ENCOUNTER — Inpatient Hospital Stay (HOSPITAL_COMMUNITY): Admission: RE | Admit: 2017-06-30 | Payer: Medicare HMO | Source: Ambulatory Visit

## 2017-07-04 ENCOUNTER — Encounter (HOSPITAL_COMMUNITY)
Admission: RE | Admit: 2017-07-04 | Discharge: 2017-07-04 | Disposition: A | Discharge status: Home / Self Care | Payer: Medicare HMO | Source: Ambulatory Visit | Attending: Nephrology | Admitting: Nephrology

## 2017-07-04 DIAGNOSIS — N184 Chronic kidney disease, stage 4 (severe): Secondary | ICD-10-CM | POA: Diagnosis present

## 2017-07-04 DIAGNOSIS — D631 Anemia in chronic kidney disease: Secondary | ICD-10-CM | POA: Insufficient documentation

## 2017-07-04 LAB — RENAL FUNCTION PANEL
Albumin: 3.5 g/dL (ref 3.5–5.0)
Anion gap: 8 (ref 5–15)
BUN: 44 mg/dL — ABNORMAL HIGH (ref 8–23)
CHLORIDE: 109 mmol/L (ref 98–111)
CO2: 24 mmol/L (ref 22–32)
Calcium: 9.3 mg/dL (ref 8.9–10.3)
Creatinine, Ser: 6.11 mg/dL — ABNORMAL HIGH (ref 0.61–1.24)
GFR calc non Af Amer: 7 mL/min — ABNORMAL LOW (ref >60)
GFR, EST AFRICAN AMERICAN: 9 mL/min — AB (ref >60)
GLUCOSE: 160 mg/dL — AB (ref 70–99)
Phosphorus: 4 mg/dL (ref 2.5–4.6)
Potassium: 4 mmol/L (ref 3.5–5.1)
Sodium: 141 mmol/L (ref 135–145)

## 2017-07-04 LAB — IRON AND TIBC
IRON: 54 ug/dL (ref 45–182)
Saturation Ratios: 24 % (ref 17.9–39.5)
TIBC: 227 ug/dL — AB (ref 250–450)
UIBC: 173 ug/dL

## 2017-07-04 LAB — FERRITIN: Ferritin: 683 ng/mL — ABNORMAL HIGH (ref 24–336)

## 2017-07-04 LAB — POCT HEMOGLOBIN-HEMACUE: HEMOGLOBIN: 10 g/dL — AB (ref 13.0–17.0)

## 2017-07-04 MED ORDER — DARBEPOETIN ALFA 100 MCG/0.5ML IJ SOSY
100.0000 ug | PREFILLED_SYRINGE | Freq: Once | INTRAMUSCULAR | Status: AC
  Administered 2017-07-04: 100 ug via SUBCUTANEOUS

## 2017-07-04 MED ORDER — DARBEPOETIN ALFA 100 MCG/0.5ML IJ SOSY
PREFILLED_SYRINGE | INTRAMUSCULAR | Status: AC
  Filled 2017-07-04: qty 0.5

## 2017-07-05 LAB — PTH, INTACT AND CALCIUM
Calcium, Total (PTH): 9.4 mg/dL (ref 8.6–10.2)
PTH: 65 pg/mL (ref 15–65)

## 2017-07-12 ENCOUNTER — Emergency Department (HOSPITAL_COMMUNITY)
Admission: EM | Admit: 2017-07-12 | Discharge: 2017-07-12 | Discharge status: Against Medical Advice | Payer: Medicare HMO

## 2017-07-21 DIAGNOSIS — R82998 Other abnormal findings in urine: Secondary | ICD-10-CM | POA: Diagnosis not present

## 2017-07-21 DIAGNOSIS — Z125 Encounter for screening for malignant neoplasm of prostate: Secondary | ICD-10-CM | POA: Diagnosis not present

## 2017-07-21 DIAGNOSIS — N184 Chronic kidney disease, stage 4 (severe): Secondary | ICD-10-CM | POA: Diagnosis not present

## 2017-07-21 DIAGNOSIS — M109 Gout, unspecified: Secondary | ICD-10-CM | POA: Diagnosis not present

## 2017-07-21 DIAGNOSIS — I129 Hypertensive chronic kidney disease with stage 1 through stage 4 chronic kidney disease, or unspecified chronic kidney disease: Secondary | ICD-10-CM | POA: Diagnosis not present

## 2017-07-21 DIAGNOSIS — E1165 Type 2 diabetes mellitus with hyperglycemia: Secondary | ICD-10-CM | POA: Diagnosis not present

## 2017-07-22 DIAGNOSIS — M1711 Unilateral primary osteoarthritis, right knee: Secondary | ICD-10-CM | POA: Diagnosis not present

## 2017-08-01 ENCOUNTER — Encounter (HOSPITAL_COMMUNITY)
Admission: RE | Admit: 2017-08-01 | Discharge: 2017-08-01 | Disposition: A | Discharge status: Home / Self Care | Payer: Medicare HMO | Source: Ambulatory Visit | Attending: Nephrology | Admitting: Nephrology

## 2017-08-01 ENCOUNTER — Encounter (HOSPITAL_COMMUNITY): Payer: Self-pay

## 2017-08-01 DIAGNOSIS — D631 Anemia in chronic kidney disease: Secondary | ICD-10-CM | POA: Diagnosis not present

## 2017-08-01 LAB — RENAL FUNCTION PANEL
ANION GAP: 8 (ref 5–15)
Albumin: 3.4 g/dL — ABNORMAL LOW (ref 3.5–5.0)
BUN: 42 mg/dL — ABNORMAL HIGH (ref 8–23)
CO2: 22 mmol/L (ref 22–32)
Calcium: 9 mg/dL (ref 8.9–10.3)
Chloride: 112 mmol/L — ABNORMAL HIGH (ref 98–111)
Creatinine, Ser: 5.54 mg/dL — ABNORMAL HIGH (ref 0.61–1.24)
GFR calc non Af Amer: 8 mL/min — ABNORMAL LOW (ref >60)
GFR, EST AFRICAN AMERICAN: 10 mL/min — AB (ref >60)
GLUCOSE: 163 mg/dL — AB (ref 70–99)
POTASSIUM: 3.6 mmol/L (ref 3.5–5.1)
Phosphorus: 2.9 mg/dL (ref 2.5–4.6)
Sodium: 142 mmol/L (ref 135–145)

## 2017-08-01 LAB — IRON AND TIBC
IRON: 26 ug/dL — AB (ref 45–182)
SATURATION RATIOS: 12 % — AB (ref 17.9–39.5)
TIBC: 213 ug/dL — AB (ref 250–450)
UIBC: 187 ug/dL

## 2017-08-01 LAB — FERRITIN: FERRITIN: 513 ng/mL — AB (ref 24–336)

## 2017-08-01 LAB — POCT HEMOGLOBIN-HEMACUE: Hemoglobin: 9.8 g/dL — ABNORMAL LOW (ref 13.0–17.0)

## 2017-08-01 MED ORDER — DARBEPOETIN ALFA 100 MCG/0.5ML IJ SOSY
100.0000 ug | PREFILLED_SYRINGE | Freq: Once | INTRAMUSCULAR | Status: AC
  Administered 2017-08-01: 100 ug via SUBCUTANEOUS
  Filled 2017-08-01: qty 0.5

## 2017-08-02 LAB — PTH, INTACT AND CALCIUM
CALCIUM TOTAL (PTH): 8.8 mg/dL (ref 8.6–10.2)
PTH: 100 pg/mL — ABNORMAL HIGH (ref 15–65)

## 2017-08-09 ENCOUNTER — Ambulatory Visit: Payer: Medicare HMO | Admitting: Family Medicine

## 2017-08-15 ENCOUNTER — Encounter (HOSPITAL_COMMUNITY): Payer: Self-pay

## 2017-08-15 ENCOUNTER — Encounter: Payer: Self-pay | Admitting: Family Medicine

## 2017-08-15 ENCOUNTER — Encounter (HOSPITAL_COMMUNITY)
Admission: RE | Admit: 2017-08-15 | Discharge: 2017-08-15 | Disposition: A | Discharge status: Home / Self Care | Payer: Medicare HMO | Source: Ambulatory Visit | Attending: Nephrology | Admitting: Nephrology

## 2017-08-15 DIAGNOSIS — N184 Chronic kidney disease, stage 4 (severe): Secondary | ICD-10-CM | POA: Diagnosis present

## 2017-08-15 DIAGNOSIS — D631 Anemia in chronic kidney disease: Secondary | ICD-10-CM | POA: Insufficient documentation

## 2017-08-15 MED ORDER — SODIUM CHLORIDE 0.9 % IV SOLN
510.0000 mg | Freq: Once | INTRAVENOUS | Status: AC
  Administered 2017-08-15: 510 mg via INTRAVENOUS
  Filled 2017-08-15: qty 17

## 2017-08-15 MED ORDER — SODIUM CHLORIDE 0.9 % IV SOLN
Freq: Once | INTRAVENOUS | Status: AC
  Administered 2017-08-15: 11:00:00 via INTRAVENOUS

## 2017-08-19 ENCOUNTER — Emergency Department (HOSPITAL_COMMUNITY)
Admission: EM | Admit: 2017-08-19 | Discharge: 2017-08-19 | Disposition: A | Discharge status: Home / Self Care | Payer: Medicare HMO | Attending: Emergency Medicine | Admitting: Emergency Medicine

## 2017-08-19 ENCOUNTER — Encounter (HOSPITAL_COMMUNITY): Payer: Self-pay | Admitting: Emergency Medicine

## 2017-08-19 ENCOUNTER — Other Ambulatory Visit: Payer: Self-pay

## 2017-08-19 ENCOUNTER — Emergency Department (HOSPITAL_COMMUNITY): Payer: Medicare HMO

## 2017-08-19 DIAGNOSIS — M25561 Pain in right knee: Secondary | ICD-10-CM

## 2017-08-19 DIAGNOSIS — M1711 Unilateral primary osteoarthritis, right knee: Secondary | ICD-10-CM | POA: Diagnosis not present

## 2017-08-19 DIAGNOSIS — Z79899 Other long term (current) drug therapy: Secondary | ICD-10-CM | POA: Insufficient documentation

## 2017-08-19 DIAGNOSIS — J45909 Unspecified asthma, uncomplicated: Secondary | ICD-10-CM | POA: Diagnosis not present

## 2017-08-19 DIAGNOSIS — I129 Hypertensive chronic kidney disease with stage 1 through stage 4 chronic kidney disease, or unspecified chronic kidney disease: Secondary | ICD-10-CM | POA: Insufficient documentation

## 2017-08-19 DIAGNOSIS — N189 Chronic kidney disease, unspecified: Secondary | ICD-10-CM | POA: Insufficient documentation

## 2017-08-19 DIAGNOSIS — Z7982 Long term (current) use of aspirin: Secondary | ICD-10-CM | POA: Diagnosis not present

## 2017-08-19 DIAGNOSIS — E1122 Type 2 diabetes mellitus with diabetic chronic kidney disease: Secondary | ICD-10-CM | POA: Diagnosis not present

## 2017-08-19 DIAGNOSIS — Z87891 Personal history of nicotine dependence: Secondary | ICD-10-CM | POA: Insufficient documentation

## 2017-08-19 MED ORDER — TRAMADOL HCL 50 MG PO TABS
100.0000 mg | ORAL_TABLET | Freq: Once | ORAL | Status: AC
  Administered 2017-08-19: 100 mg via ORAL
  Filled 2017-08-19: qty 2

## 2017-08-19 MED ORDER — PREDNISONE 20 MG PO TABS: ORAL_TABLET | ORAL | 0 refills | Status: DC

## 2017-08-19 MED ORDER — DEXAMETHASONE SODIUM PHOSPHATE 10 MG/ML IJ SOLN
10.0000 mg | Freq: Once | INTRAMUSCULAR | Status: AC
  Administered 2017-08-19: 10 mg via INTRAMUSCULAR
  Filled 2017-08-19: qty 1

## 2017-08-19 MED ORDER — TRAMADOL HCL 50 MG PO TABS: 50.0000 mg | ORAL_TABLET | Freq: Four times a day (QID) | ORAL | 0 refills | Status: DC | PRN

## 2017-08-19 MED ORDER — ONDANSETRON HCL 4 MG PO TABS
4.0000 mg | ORAL_TABLET | Freq: Once | ORAL | Status: AC
  Administered 2017-08-19: 4 mg via ORAL
  Filled 2017-08-19: qty 1

## 2017-08-19 NOTE — ED Provider Notes (Signed)
[facility] EMERGENCY DEPARTMENT Provider Note   CSN: [id number] Arrival date & time: 08/19/17  1436     History   Chief Complaint Chief Complaint  Patient presents with  . Knee Pain    HPI Miguel Tucker is a 84 y.o. male.  Patient is an 84 year old male who presents to the emergency department with a complaint of right knee pain.  The family states that the patient has had problems with this knee in the past.  This time the patient has been having problems with knee pain for over 2 months.  It seemed to have gotten worse on last night.  He has pain with attempting to put weight on it.  No recent injury reported.  No recent operations or procedures noted.  There is some question of swelling, and the area is warm according to family.  They present now for assistance with this issue.  The history is provided by the patient and a relative.    Past Medical History:  Diagnosis Date  . Asthma   . Chronic kidney disease   . Diabetes mellitus    type 2  . Frequent urination at night   . Gout   . Hard of hearing   . High cholesterol   . Hypertension   . Hypokalemia     Patient Active Problem List   Diagnosis Date Noted  . Onychomycosis 04/11/2017  . Hammer toe 04/11/2017  . Diabetes mellitus ([place]) 04/11/2017  . Osteoarthritis of knee 02/11/2017  . Difficulty in walking(719.7) 12/09/2011  . Radicular pain of left lower extremity 12/09/2011  . Wound healing, delayed 11/06/2010    Past Surgical History:  Procedure Laterality Date  . APPENDECTOMY    . BASCILIC VEIN TRANSPOSITION Left 09/09/2015   Procedure: FIRST STAGE BRACHIAL VEIN TRANSPOSITION LEFT ARM;  Surgeon: Elam Dutch, MD;  Location: [place];  Service: Vascular;  Laterality: Left;  . BASCILIC VEIN TRANSPOSITION Left 02/20/2016   Procedure: LEFT ARM SECOND STAGE BASILIC VEIN TRANSPOSITION;  Surgeon: Rosetta Posner, MD;  Location: [place];  Service: Vascular;  Laterality: Left;  . OTHER SURGICAL HISTORY     testicular tumor- benign   . ROTATOR CUFF REPAIR Right   . WOUND DEBRIDEMENT  06/01/2011   Procedure: DEBRIDEMENT ABDOMINAL WOUND;  Surgeon: [name], MD;  Location: [facility];  Service: General;  Laterality: N/A;  Remove Sutures         Home Medications    Prior to Admission medications   Medication Sig Start Date End Date Taking? Authorizing Provider  allopurinol (ZYLOPRIM) 100 MG tablet Take 100 mg by mouth daily.  07/23/15   [provider]  amLODipine (NORVASC) 10 MG tablet Take 10 mg by mouth every evening.    [provider]  aspirin EC 81 MG tablet Take 81 mg by mouth every morning.     [provider]  calcitRIOL (ROCALTROL) 0.25 MCG capsule Take 0.25 mcg by mouth daily.  07/23/15   [provider]  ferrous sulfate 325 (65 FE) MG tablet Take 325 mg by mouth daily with breakfast.    [provider]  finasteride (PROSCAR) 5 MG tablet Take 5 mg by mouth daily.    [provider]  furosemide (LASIX) 40 MG tablet Take 40 mg by mouth daily.  07/24/15   [provider]  labetalol (NORMODYNE) 200 MG tablet Take 200 mg by mouth 2 (two) times daily.  07/23/15   [provider]  Misc. Devices (  ADJUSTABLE ALUMINUM CANE) MISC Use the cane as needed for weight bearing 06/22/17   Triplett, Tammy, PA-C  predniSONE (DELTASONE) 20 MG tablet Take 1 tablet (20 mg total) by mouth daily. For 5 days 06/22/17   Triplett, Tammy, PA-C  traMADol (ULTRAM) 50 MG tablet Take 1 tablet (50 mg total) by mouth every 6 (six) hours as needed. 06/22/17   Triplett, Tammy, PA-C  valsartan (DIOVAN) 160 MG tablet Take 160 mg by mouth 2 (two) times daily. 07/10/15   [provider]    Family History History reviewed. No pertinent family history.  Social History Social History   Tobacco Use  . Smoking status: Former Smoker    Last attempt to quit: 11/06/1975    Years since quitting: 41.8  . Smokeless tobacco: Never Used  Substance Use Topics   . Alcohol use: No  . Drug use: No     Allergies   Penicillins   Review of Systems Review of Systems  Constitutional: Negative for activity change.       All ROS Neg except as noted in HPI  HENT: Negative for nosebleeds.   Eyes: Negative for photophobia and discharge.  Respiratory: Negative for cough, shortness of breath and wheezing.   Cardiovascular: Negative for chest pain and palpitations.  Gastrointestinal: Negative for abdominal pain and blood in stool.  Genitourinary: Negative for dysuria, frequency and hematuria.  Musculoskeletal: Positive for arthralgias and back pain. Negative for neck pain.       Knee pain  Skin: Negative.   Neurological: Negative for dizziness, seizures and speech difficulty.  Psychiatric/Behavioral: Negative for confusion and hallucinations.     Physical Exam Updated Vital Signs BP (!) 180/97 (BP Location: Right Arm)   Pulse 94   Temp 98.7 F (37.1 C) (Oral)   Resp 19   Ht 5\' 5"  (1.651 m)   Wt 84.8 kg   SpO2 100%   BMI 31.11 kg/m   Physical Exam  Constitutional: He is oriented to person, place, and time. He appears well-developed and well-nourished.  Non-toxic appearance.  HENT:  Head: Normocephalic.  Right Ear: Tympanic membrane and external ear normal.  Left Ear: Tympanic membrane and external ear normal.  Eyes: Pupils are equal, round, and reactive to light. EOM and lids are normal.  Neck: Normal range of motion. Neck supple. Carotid bruit is not present.  Cardiovascular: Normal rate, regular rhythm, normal heart sounds, intact distal pulses and normal pulses.  Pulmonary/Chest: Breath sounds normal. No respiratory distress.  Abdominal: Soft. Bowel sounds are normal. There is no tenderness. There is no guarding.  Musculoskeletal: Normal range of motion.       Right knee: He exhibits effusion. Tenderness found. Lateral joint line tenderness noted.  No hot joint noted. No posterior mass. The knee is warm, but not hot.    Lymphadenopathy:       Head (right side): No submandibular adenopathy present.       Head (left side): No submandibular adenopathy present.    He has no cervical adenopathy.  Neurological: He is alert and oriented to person, place, and time. He has normal strength. No cranial nerve deficit or sensory deficit.  Skin: Skin is warm and dry.  Psychiatric: He has a normal mood and affect. His speech is normal.  Nursing note and vitals reviewed.    ED Treatments / Results  Labs (all labs ordered are listed, but only abnormal results are displayed) Labs Reviewed - No data to display  EKG None  Radiology No  results found.  Procedures Procedures (including critical care time)  Medications Ordered in ED Medications - No data to display   Initial Impression / Assessment and Plan / ED Course  I have reviewed the triage vital signs and the nursing notes.  Pertinent labs & imaging results that were available during my care of the patient were reviewed by me and considered in my medical decision making (see chart for details).       Final Clinical Impressions(s) / ED Diagnoses MDM  Blood pressure is elevated initially at 180/97, however recheck shows the blood pressure down to 120/99.  The pulse oximetry is 99 to 100%. No history of recent injury or procedures involving the right knee.  X-ray shows suprapatellar joint effusion.  There is severe degenerative joint disease noted medially.  There is no fracture, and no dislocation appreciated.  There is mild patella spurring noted.  I have discussed the findings on the examination as well as the findings from the x-ray with the family.  Patient will be treated with a short course of steroid and he will be asked to use Tylenol extra strength every 4 hours for pain.  Patient will be given 10 tablets of Ultram to use for more severe pain.  Patient is to follow-up with Dr. Buelah Manis in the office.   Final diagnoses:  Primary osteoarthritis  of right knee  Acute pain of right knee    ED Discharge Orders         Ordered    predniSONE (DELTASONE) 20 MG tablet     08/19/17 1607    traMADol (ULTRAM) 50 MG tablet  Every 6 hours PRN     08/19/17 1607           Lily Kocher, PA-C 08/20/17 0810    Francine Graven, DO 08/20/17 [phone]

## 2017-08-19 NOTE — Discharge Instructions (Addendum)
Your x-ray is negative for fracture or dislocation.  Your x-ray does show arthritis in multiple areas of your knee.  Please use prednisone daily with food.  Please use Tylenol extra strength every 4 hours for mild pain in your knee,. please use Ultram every 6 hours as needed for pain not improved by Tylenol extra strength.

## 2017-08-19 NOTE — ED Triage Notes (Signed)
Patient complaining of pain to right knee x 2 months, worsening since last night. Denies injury.

## 2017-08-22 ENCOUNTER — Encounter (HOSPITAL_COMMUNITY)
Admission: RE | Admit: 2017-08-22 | Discharge: 2017-08-22 | Disposition: A | Discharge status: Home / Self Care | Payer: Medicare HMO | Source: Ambulatory Visit | Attending: Nephrology | Admitting: Nephrology

## 2017-08-22 ENCOUNTER — Ambulatory Visit: Payer: Medicare HMO | Admitting: Family Medicine

## 2017-08-22 DIAGNOSIS — N184 Chronic kidney disease, stage 4 (severe): Secondary | ICD-10-CM | POA: Diagnosis not present

## 2017-08-22 MED ORDER — SODIUM CHLORIDE 0.9 % IV SOLN
INTRAVENOUS | Status: DC
  Administered 2017-08-22: 11:00:00 via INTRAVENOUS

## 2017-08-22 MED ORDER — SODIUM CHLORIDE 0.9 % IV SOLN
510.0000 mg | Freq: Once | INTRAVENOUS | Status: AC
  Administered 2017-08-22: 510 mg via INTRAVENOUS
  Filled 2017-08-22: qty 17

## 2017-08-29 ENCOUNTER — Encounter (HOSPITAL_COMMUNITY): Payer: Self-pay

## 2017-08-29 ENCOUNTER — Encounter (HOSPITAL_COMMUNITY)
Admission: RE | Admit: 2017-08-29 | Discharge: 2017-08-29 | Disposition: A | Discharge status: Home / Self Care | Payer: Medicare HMO | Source: Ambulatory Visit | Attending: Nephrology | Admitting: Nephrology

## 2017-08-29 DIAGNOSIS — N184 Chronic kidney disease, stage 4 (severe): Secondary | ICD-10-CM | POA: Diagnosis not present

## 2017-08-29 LAB — POCT HEMOGLOBIN-HEMACUE: Hemoglobin: 10.5 g/dL — ABNORMAL LOW (ref 13.0–17.0)

## 2017-08-29 LAB — RENAL FUNCTION PANEL
Albumin: 3.5 g/dL (ref 3.5–5.0)
Anion gap: 10 (ref 5–15)
BUN: 55 mg/dL — ABNORMAL HIGH (ref 8–23)
CHLORIDE: 110 mmol/L (ref 98–111)
CO2: 23 mmol/L (ref 22–32)
Calcium: 9.1 mg/dL (ref 8.9–10.3)
Creatinine, Ser: 5.71 mg/dL — ABNORMAL HIGH (ref 0.61–1.24)
GFR, EST AFRICAN AMERICAN: 9 mL/min — AB (ref >60)
GFR, EST NON AFRICAN AMERICAN: 8 mL/min — AB (ref >60)
Glucose, Bld: 101 mg/dL — ABNORMAL HIGH (ref 70–99)
POTASSIUM: 3.5 mmol/L (ref 3.5–5.1)
Phosphorus: 4.6 mg/dL (ref 2.5–4.6)
Sodium: 143 mmol/L (ref 135–145)

## 2017-08-29 LAB — FERRITIN: FERRITIN: 1057 ng/mL — AB (ref 24–336)

## 2017-08-29 LAB — IRON AND TIBC
IRON: 79 ug/dL (ref 45–182)
SATURATION RATIOS: 36 % (ref 17.9–39.5)
TIBC: 218 ug/dL — ABNORMAL LOW (ref 250–450)
UIBC: 139 ug/dL

## 2017-08-29 MED ORDER — DARBEPOETIN ALFA 100 MCG/0.5ML IJ SOSY
100.0000 ug | PREFILLED_SYRINGE | INTRAMUSCULAR | Status: DC
  Administered 2017-08-29: 100 ug via SUBCUTANEOUS

## 2017-08-29 MED ORDER — DARBEPOETIN ALFA 100 MCG/0.5ML IJ SOSY
PREFILLED_SYRINGE | INTRAMUSCULAR | Status: AC
  Filled 2017-08-29: qty 0.5

## 2017-08-30 ENCOUNTER — Encounter: Payer: Self-pay | Admitting: Family Medicine

## 2017-08-30 LAB — PTH, INTACT AND CALCIUM
Calcium, Total (PTH): 9.1 mg/dL (ref 8.6–10.2)
PTH: 120 pg/mL — ABNORMAL HIGH (ref 15–65)

## 2017-09-04 ENCOUNTER — Encounter (HOSPITAL_COMMUNITY): Payer: Self-pay | Admitting: Emergency Medicine

## 2017-09-04 ENCOUNTER — Emergency Department (HOSPITAL_COMMUNITY)
Admission: EM | Admit: 2017-09-04 | Discharge: 2017-09-04 | Disposition: A | Discharge status: Home / Self Care | Payer: Medicare HMO | Attending: Emergency Medicine | Admitting: Emergency Medicine

## 2017-09-04 ENCOUNTER — Other Ambulatory Visit: Payer: Self-pay

## 2017-09-04 ENCOUNTER — Emergency Department (HOSPITAL_COMMUNITY): Payer: Medicare HMO

## 2017-09-04 DIAGNOSIS — Z7982 Long term (current) use of aspirin: Secondary | ICD-10-CM | POA: Insufficient documentation

## 2017-09-04 DIAGNOSIS — J069 Acute upper respiratory infection, unspecified: Secondary | ICD-10-CM | POA: Insufficient documentation

## 2017-09-04 DIAGNOSIS — J45909 Unspecified asthma, uncomplicated: Secondary | ICD-10-CM | POA: Insufficient documentation

## 2017-09-04 DIAGNOSIS — E119 Type 2 diabetes mellitus without complications: Secondary | ICD-10-CM | POA: Diagnosis not present

## 2017-09-04 DIAGNOSIS — W19XXXA Unspecified fall, initial encounter: Secondary | ICD-10-CM | POA: Diagnosis not present

## 2017-09-04 DIAGNOSIS — Z79899 Other long term (current) drug therapy: Secondary | ICD-10-CM | POA: Diagnosis not present

## 2017-09-04 DIAGNOSIS — I10 Essential (primary) hypertension: Secondary | ICD-10-CM | POA: Insufficient documentation

## 2017-09-04 DIAGNOSIS — R531 Weakness: Secondary | ICD-10-CM | POA: Diagnosis not present

## 2017-09-04 DIAGNOSIS — Z87891 Personal history of nicotine dependence: Secondary | ICD-10-CM | POA: Diagnosis not present

## 2017-09-04 LAB — CBC WITH DIFFERENTIAL/PLATELET
Basophils Absolute: 0 10*3/uL (ref 0.0–0.1)
Basophils Relative: 0 %
EOS ABS: 0 10*3/uL (ref 0.0–0.7)
Eosinophils Relative: 0 %
HEMATOCRIT: 32 % — AB (ref 39.0–52.0)
HEMOGLOBIN: 10.4 g/dL — AB (ref 13.0–17.0)
LYMPHS ABS: 1 10*3/uL (ref 0.7–4.0)
LYMPHS PCT: 8 %
MCH: 27.6 pg (ref 26.0–34.0)
MCHC: 32.5 g/dL (ref 30.0–36.0)
MCV: 84.9 fL (ref 78.0–100.0)
MONOS PCT: 8 %
Monocytes Absolute: 1.1 10*3/uL — ABNORMAL HIGH (ref 0.1–1.0)
Neutro Abs: 10.3 10*3/uL — ABNORMAL HIGH (ref 1.7–7.7)
Neutrophils Relative %: 84 %
Platelets: 224 10*3/uL (ref 150–400)
RBC: 3.77 MIL/uL — AB (ref 4.22–5.81)
RDW: 17.1 % — ABNORMAL HIGH (ref 11.5–15.5)
WBC: 12.5 10*3/uL — ABNORMAL HIGH (ref 4.0–10.5)

## 2017-09-04 LAB — URINALYSIS, ROUTINE W REFLEX MICROSCOPIC
Bilirubin Urine: NEGATIVE
GLUCOSE, UA: NEGATIVE mg/dL
Ketones, ur: NEGATIVE mg/dL
Leukocytes, UA: NEGATIVE
NITRITE: NEGATIVE
PH: 5 (ref 5.0–8.0)
Protein, ur: 30 mg/dL — AB
SPECIFIC GRAVITY, URINE: 1.01 (ref 1.005–1.030)

## 2017-09-04 LAB — COMPREHENSIVE METABOLIC PANEL
ALK PHOS: 72 U/L (ref 38–126)
ALT: 12 U/L (ref 0–44)
AST: 10 U/L — ABNORMAL LOW (ref 15–41)
Albumin: 3.5 g/dL (ref 3.5–5.0)
Anion gap: 12 (ref 5–15)
BILIRUBIN TOTAL: 0.8 mg/dL (ref 0.3–1.2)
BUN: 46 mg/dL — ABNORMAL HIGH (ref 8–23)
CALCIUM: 9.3 mg/dL (ref 8.9–10.3)
CO2: 19 mmol/L — AB (ref 22–32)
CREATININE: 6.28 mg/dL — AB (ref 0.61–1.24)
Chloride: 108 mmol/L (ref 98–111)
GFR calc non Af Amer: 7 mL/min — ABNORMAL LOW (ref >60)
GFR, EST AFRICAN AMERICAN: 8 mL/min — AB (ref >60)
GLUCOSE: 149 mg/dL — AB (ref 70–99)
Potassium: 3.3 mmol/L — ABNORMAL LOW (ref 3.5–5.1)
SODIUM: 139 mmol/L (ref 135–145)
TOTAL PROTEIN: 6.6 g/dL (ref 6.5–8.1)

## 2017-09-04 LAB — I-STAT CG4 LACTIC ACID, ED: Lactic Acid, Venous: 1.05 mmol/L (ref 0.5–1.9)

## 2017-09-04 MED ORDER — ACETAMINOPHEN 325 MG PO TABS
650.0000 mg | ORAL_TABLET | Freq: Once | ORAL | Status: AC
  Administered 2017-09-04: 650 mg via ORAL
  Filled 2017-09-04: qty 2

## 2017-09-04 MED ORDER — SODIUM CHLORIDE 0.9 % IV BOLUS
500.0000 mL | Freq: Once | INTRAVENOUS | Status: AC
  Administered 2017-09-04: 500 mL via INTRAVENOUS

## 2017-09-04 NOTE — ED Notes (Signed)
Lab at bedside

## 2017-09-04 NOTE — ED Notes (Signed)
Pt and wife live alone per EMS, both have dementia and live alone  Today, niece came to visit and was concerned that pt had not gotten out of his chair all day and appeared weak- (pt denies( Brought here for evaluation  Pt incontinent of urine and confused  Unsuccessful IV stick x 2 by this RN

## 2017-09-04 NOTE — ED Notes (Signed)
Rad in  IV established, 3rd attempt lab in to draw

## 2017-09-04 NOTE — ED Notes (Signed)
Patient will need social service consult as he lives alone w wife alone and they both have memory issues

## 2017-09-04 NOTE — ED Provider Notes (Signed)
[facility] EMERGENCY DEPARTMENT Provider Note   CSN: [id number] Arrival date & time: 09/04/17  1512     History   Chief Complaint Chief Complaint  Patient presents with  . Weakness    HPI Miguel Tucker is a 84 y.o. male.  Patient with history of dementia, hypertension, chronic kidney disease who was brought in by family due to some general weakness.  Patient has no complaints.  Denies chest pain, shortness of breath.  Patient had been sitting in his chair today and family was concerned that he was less active than normal.  Denies any fever, cough, sputum production.  No falls.  The history is provided by the patient.  Illness  This is a new problem. The current episode started 3 to 5 hours ago. The problem occurs rarely. The problem has not changed since onset.Pertinent negatives include no chest pain, no abdominal pain, no headaches and no shortness of breath. Nothing aggravates the symptoms. Nothing relieves the symptoms. He has tried nothing for the symptoms. The treatment provided no relief.    Past Medical History:  Diagnosis Date  . Asthma   . Chronic kidney disease   . Diabetes mellitus    type 2  . Frequent urination at night   . Gout   . Hard of hearing   . High cholesterol   . Hypertension   . Hypokalemia     Patient Active Problem List   Diagnosis Date Noted  . Onychomycosis 04/11/2017  . Hammer toe 04/11/2017  . Diabetes mellitus ([place]) 04/11/2017  . Osteoarthritis of knee 02/11/2017  . Difficulty in walking(719.7) 12/09/2011  . Radicular pain of left lower extremity 12/09/2011  . Wound healing, delayed 11/06/2010    Past Surgical History:  Procedure Laterality Date  . APPENDECTOMY    . BASCILIC VEIN TRANSPOSITION Left 09/09/2015   Procedure: FIRST STAGE BRACHIAL VEIN TRANSPOSITION LEFT ARM;  Surgeon: Elam Dutch, MD;  Location: [place];  Service: Vascular;  Laterality: Left;  . BASCILIC VEIN TRANSPOSITION Left 02/20/2016   Procedure: LEFT  ARM SECOND STAGE BASILIC VEIN TRANSPOSITION;  Surgeon: Rosetta Posner, MD;  Location: [place];  Service: Vascular;  Laterality: Left;  . OTHER SURGICAL HISTORY     testicular tumor- benign   . ROTATOR CUFF REPAIR Right   . WOUND DEBRIDEMENT  06/01/2011   Procedure: DEBRIDEMENT ABDOMINAL WOUND;  Surgeon: [name], MD;  Location: [facility];  Service: General;  Laterality: N/A;  Remove Sutures         Home Medications    Prior to Admission medications   Medication Sig Start Date End Date Taking? Authorizing Provider  allopurinol (ZYLOPRIM) 100 MG tablet Take 100 mg by mouth daily.  07/23/15  Yes [provider]  amLODipine (NORVASC) 10 MG tablet Take 10 mg by mouth every evening.   Yes [provider]  aspirin EC 81 MG tablet Take 81 mg by mouth every morning.    Yes [provider]  calcitRIOL (ROCALTROL) 0.25 MCG capsule Take 0.25 mcg by mouth daily.  07/23/15  Yes [provider]  ferrous sulfate 325 (65 FE) MG tablet Take 325 mg by mouth daily with breakfast.   Yes [provider]  finasteride (PROSCAR) 5 MG tablet Take 5 mg by mouth daily.   Yes [provider]  furosemide (LASIX) 40 MG tablet Take 40 mg by mouth daily.  07/24/15  Yes [provider]  labetalol (NORMODYNE) 200 MG tablet Take 200 mg by  mouth 2 (two) times daily.  07/23/15  Yes [provider]  traMADol (ULTRAM) 50 MG tablet Take 1 tablet (50 mg total) by mouth every 6 (six) hours as needed. 08/19/17  Yes Lily Kocher, PA-C  valsartan (DIOVAN) 160 MG tablet Take 160 mg by mouth 2 (two) times daily. 07/10/15  Yes [provider]  [place]. Devices (ADJUSTABLE ALUMINUM CANE) MISC Use the cane as needed for weight bearing 06/22/17   Kem Parkinson, PA-C    Family History History reviewed. No pertinent family history.  Social History Social History   Tobacco Use  . Smoking status: Former Smoker    Last attempt to quit: 11/06/1975    Years since  quitting: 41.8  . Smokeless tobacco: Never Used  Substance Use Topics  . Alcohol use: No  . Drug use: No     Allergies   Penicillins   Review of Systems Review of Systems  Constitutional: Negative for chills and fever.  HENT: Negative for ear pain and sore throat.   Eyes: Negative for pain and visual disturbance.  Respiratory: Negative for cough and shortness of breath.   Cardiovascular: Negative for chest pain and palpitations.  Gastrointestinal: Negative for abdominal pain and vomiting.  Genitourinary: Negative for dysuria and hematuria.  Musculoskeletal: Negative for arthralgias and back pain.  Skin: Negative for color change and rash.  Neurological: Negative for tremors, seizures, syncope, weakness and headaches.  All other systems reviewed and are negative.    Physical Exam Updated Vital Signs  ED Triage Vitals [09/04/17 1520]  Enc Vitals Group     BP (!) 148/76     Pulse Rate 81     Resp 18     Temp 99.5 F (37.5 C)     Temp Source Oral     SpO2 99 %     Weight      Height      Head Circumference      Peak Flow      Pain Score 0     Pain Loc      Pain Edu?      Excl. in [place]?     Physical Exam  Constitutional: He is oriented to person, place, and time. He appears well-developed and well-nourished.  HENT:  Head: Normocephalic and atraumatic.  Mouth/Throat: No oropharyngeal exudate.  Eyes: Pupils are equal, round, and reactive to light. Conjunctivae and EOM are normal.  Neck: Normal range of motion. Neck supple.  Cardiovascular: Normal rate, regular rhythm, normal heart sounds and intact distal pulses.  No murmur heard. Pulmonary/Chest: Effort normal and breath sounds normal. No respiratory distress.  Abdominal: Soft. There is no tenderness.  Musculoskeletal: Normal range of motion. He exhibits no edema or tenderness.  Neurological: He is alert and oriented to person, place, and time. No cranial nerve deficit or sensory deficit. He exhibits normal  muscle tone. Coordination normal.  5+/5 strength, normal sensation, no drift  Skin: Skin is warm and dry.  Psychiatric: He has a normal mood and affect.  Nursing note and vitals reviewed.    ED Treatments / Results  Labs (all labs ordered are listed, but only abnormal results are displayed) Labs Reviewed  CBC WITH DIFFERENTIAL/PLATELET - Abnormal; Notable for the following components:      Result Value   WBC 12.5 (*)    RBC 3.77 (*)    Hemoglobin 10.4 (*)    HCT 32.0 (*)    RDW 17.1 (*)    Neutro Abs 10.3 (*)  Monocytes Absolute 1.1 (*)    All other components within normal limits  COMPREHENSIVE METABOLIC PANEL - Abnormal; Notable for the following components:   Potassium 3.3 (*)    CO2 19 (*)    Glucose, Bld 149 (*)    BUN 46 (*)    Creatinine, Ser 6.28 (*)    AST 10 (*)    GFR calc non Af Amer 7 (*)    GFR calc Af Amer 8 (*)    All other components within normal limits  URINALYSIS, ROUTINE W REFLEX MICROSCOPIC - Abnormal; Notable for the following components:   Hgb urine dipstick MODERATE (*)    Protein, ur 30 (*)    Bacteria, UA RARE (*)    All other components within normal limits  CULTURE, BLOOD (ROUTINE X 2)  CULTURE, BLOOD (ROUTINE X 2)  URINE CULTURE  I-STAT CG4 LACTIC ACID, ED  I-STAT CG4 LACTIC ACID, ED    EKG EKG Interpretation  Date/Time:  Sunday September 04 2017 15:30:33 EDT Ventricular Rate:  83 PR Interval:    QRS Duration: 97 QT Interval:  396 QTC Calculation: 466 R Axis:   -60 Text Interpretation:  Sinus rhythm Left anterior fascicular block No significant change since last tracing Confirmed by Lennice Sites [phone]) on 09/04/2017 3:37:38 PM   Radiology Dg Chest Port 1 View  Result Date: 09/04/2017 CLINICAL DATA:  WEAKNESS, PER ER NOTE, Per EMS pt wife called 911 because pt has been sitting in his chair all day and is weak. Per ems pt has dementia. RN asked pt if he is hurting or knows why he is here and patient states he doesn't. Denies a  fall. History of asthma, diabetes, hypertension. EXAM: PORTABLE CHEST 1 VIEW COMPARISON:  05/27/2011 FINDINGS: The heart is accentuated by technique. Aorta is tortuous. There are no focal consolidations or pleural effusions. No pulmonary edema. IMPRESSION: No active disease. Electronically Signed   By: Nolon Nations [name]   On: 09/04/2017 16:03    Procedures Procedures (including critical care time)  Medications Ordered in ED Medications  sodium chloride 0.9 % bolus 500 mL (0 mLs Intravenous Stopped 09/04/17 1657)  acetaminophen (TYLENOL) tablet 650 mg (650 mg Oral Given 09/04/17 1733)     Initial Impression / Assessment and Plan / ED Course  I have reviewed the triage vital signs and the nursing notes.  Pertinent labs & imaging results that were available during my care of the patient were reviewed by me and considered in my medical decision making (see chart for details).     Miguel Tucker is an 84 year old male with history of diabetes, high cholesterol, CKD who presents to the ED with general weakness.  Patient with normal vitals.  Temperature 100.6.  Patient brought here by family as he had been sleeping more today.  Patient is alert and oriented upon my evaluation.  Is pleasant.  He has no specific complaints.  Patient denies any abdominal pain, chest pain, shortness of breath.  No cough, no sputum production.  Denies any urinary symptoms.  Given fever chest x-ray, urinalysis, labs, lactic acid, blood cultures ordered.  Patient otherwise with normal vitals and no concern for sepsis at this time.  Patient with 500 cc of fluid given and given Tylenol.  Patient has no ulcers on his skin and no signs to suggest meningitis or encephalopathy on exam.  Patient with mild leukocytosis but otherwise no significant electrolyte abnormality, anemia.  Creatinine at baseline.  Patient with EKG that was unremarkable.  No signs of pneumonia on chest x-ray no signs of urinary tract infection.  Patient  is not high risk for occult bacteremia.  Vitals hemodynamically stable throughout my care.  Overall well appearing.  No source for infection at this time and likely patient with viral process. No known aspiration history. Recommend continued hydration and Tylenol at home.  Recommend close follow-up with primary care doctor in two days to make sure no need for repeat CXR, urinalysis.  Family would like discharged to home at this time and believe that is safe for the patient.  Given return precautions.  Final Clinical Impressions(s) / ED Diagnoses   Final diagnoses:  Viral URI    ED Discharge Orders    None       Lennice Sites, DO 09/04/17 1832

## 2017-09-04 NOTE — ED Triage Notes (Signed)
Per EMS pt wife called 911 because pt has been sitting in his chair all day and is weak. Per ems pt has dementia. RN asked pt if he is hurting or knows why he is here and patient states he doesn't.denies fall.

## 2017-09-06 ENCOUNTER — Emergency Department (HOSPITAL_COMMUNITY): Payer: Medicare HMO

## 2017-09-06 ENCOUNTER — Encounter (HOSPITAL_COMMUNITY): Payer: Self-pay | Admitting: Emergency Medicine

## 2017-09-06 ENCOUNTER — Observation Stay (HOSPITAL_COMMUNITY)
Admission: EM | Admit: 2017-09-06 | Discharge: 2017-09-13 | Disposition: A | Discharge status: Home / Self Care | Payer: Medicare HMO | Attending: Internal Medicine | Admitting: Internal Medicine

## 2017-09-06 ENCOUNTER — Other Ambulatory Visit: Payer: Self-pay

## 2017-09-06 DIAGNOSIS — F0390 Unspecified dementia without behavioral disturbance: Secondary | ICD-10-CM | POA: Diagnosis not present

## 2017-09-06 DIAGNOSIS — N179 Acute kidney failure, unspecified: Secondary | ICD-10-CM

## 2017-09-06 DIAGNOSIS — I120 Hypertensive chronic kidney disease with stage 5 chronic kidney disease or end stage renal disease: Secondary | ICD-10-CM | POA: Diagnosis not present

## 2017-09-06 DIAGNOSIS — D638 Anemia in other chronic diseases classified elsewhere: Secondary | ICD-10-CM | POA: Diagnosis not present

## 2017-09-06 DIAGNOSIS — D631 Anemia in chronic kidney disease: Secondary | ICD-10-CM | POA: Insufficient documentation

## 2017-09-06 DIAGNOSIS — E1122 Type 2 diabetes mellitus with diabetic chronic kidney disease: Secondary | ICD-10-CM | POA: Diagnosis not present

## 2017-09-06 DIAGNOSIS — R69 Illness, unspecified: Secondary | ICD-10-CM | POA: Diagnosis not present

## 2017-09-06 DIAGNOSIS — Z87891 Personal history of nicotine dependence: Secondary | ICD-10-CM | POA: Diagnosis not present

## 2017-09-06 DIAGNOSIS — R531 Weakness: Principal | ICD-10-CM

## 2017-09-06 DIAGNOSIS — N185 Chronic kidney disease, stage 5: Secondary | ICD-10-CM | POA: Diagnosis not present

## 2017-09-06 DIAGNOSIS — Z23 Encounter for immunization: Secondary | ICD-10-CM | POA: Diagnosis not present

## 2017-09-06 DIAGNOSIS — E1165 Type 2 diabetes mellitus with hyperglycemia: Secondary | ICD-10-CM | POA: Diagnosis not present

## 2017-09-06 DIAGNOSIS — I10 Essential (primary) hypertension: Secondary | ICD-10-CM

## 2017-09-06 DIAGNOSIS — M6281 Muscle weakness (generalized): Secondary | ICD-10-CM | POA: Insufficient documentation

## 2017-09-06 DIAGNOSIS — E119 Type 2 diabetes mellitus without complications: Secondary | ICD-10-CM

## 2017-09-06 DIAGNOSIS — N189 Chronic kidney disease, unspecified: Secondary | ICD-10-CM

## 2017-09-06 DIAGNOSIS — N184 Chronic kidney disease, stage 4 (severe): Secondary | ICD-10-CM

## 2017-09-06 DIAGNOSIS — Z7982 Long term (current) use of aspirin: Secondary | ICD-10-CM | POA: Diagnosis not present

## 2017-09-06 DIAGNOSIS — Z79899 Other long term (current) drug therapy: Secondary | ICD-10-CM | POA: Diagnosis not present

## 2017-09-06 DIAGNOSIS — I129 Hypertensive chronic kidney disease with stage 1 through stage 4 chronic kidney disease, or unspecified chronic kidney disease: Secondary | ICD-10-CM | POA: Diagnosis not present

## 2017-09-06 HISTORY — DX: Other chronic pain: G89.29

## 2017-09-06 HISTORY — DX: Pain in unspecified knee: M25.569

## 2017-09-06 HISTORY — DX: Unspecified dementia, unspecified severity, without behavioral disturbance, psychotic disturbance, mood disturbance, and anxiety: F03.90

## 2017-09-06 HISTORY — DX: Radiculopathy, site unspecified: M54.10

## 2017-09-06 LAB — URINALYSIS, ROUTINE W REFLEX MICROSCOPIC
Bacteria, UA: NONE SEEN
Bilirubin Urine: NEGATIVE
Glucose, UA: NEGATIVE mg/dL
KETONES UR: NEGATIVE mg/dL
Leukocytes, UA: NEGATIVE
Nitrite: NEGATIVE
PH: 5 (ref 5.0–8.0)
Protein, ur: 100 mg/dL — AB
SPECIFIC GRAVITY, URINE: 1.011 (ref 1.005–1.030)

## 2017-09-06 LAB — CBC WITH DIFFERENTIAL/PLATELET
BASOS ABS: 0 10*3/uL (ref 0.0–0.1)
Basophils Relative: 0 %
EOS ABS: 0 10*3/uL (ref 0.0–0.7)
Eosinophils Relative: 0 %
HCT: 31.1 % — ABNORMAL LOW (ref 39.0–52.0)
HEMOGLOBIN: 10.1 g/dL — AB (ref 13.0–17.0)
Lymphocytes Relative: 8 %
Lymphs Abs: 1 10*3/uL (ref 0.7–4.0)
MCH: 27.4 pg (ref 26.0–34.0)
MCHC: 32.5 g/dL (ref 30.0–36.0)
MCV: 84.3 fL (ref 78.0–100.0)
MONO ABS: 1.4 10*3/uL — AB (ref 0.1–1.0)
MONOS PCT: 11 %
NEUTROS PCT: 81 %
Neutro Abs: 10.5 10*3/uL — ABNORMAL HIGH (ref 1.7–7.7)
PLATELETS: 214 10*3/uL (ref 150–400)
RBC: 3.69 MIL/uL — AB (ref 4.22–5.81)
RDW: 16.9 % — ABNORMAL HIGH (ref 11.5–15.5)
WBC: 12.9 10*3/uL — ABNORMAL HIGH (ref 4.0–10.5)

## 2017-09-06 LAB — COMPREHENSIVE METABOLIC PANEL
ALBUMIN: 3.2 g/dL — AB (ref 3.5–5.0)
ALK PHOS: 75 U/L (ref 38–126)
ALT: 13 U/L (ref 0–44)
AST: 13 U/L — ABNORMAL LOW (ref 15–41)
Anion gap: 10 (ref 5–15)
BILIRUBIN TOTAL: 0.8 mg/dL (ref 0.3–1.2)
BUN: 49 mg/dL — AB (ref 8–23)
CO2: 21 mmol/L — ABNORMAL LOW (ref 22–32)
Calcium: 9.4 mg/dL (ref 8.9–10.3)
Chloride: 108 mmol/L (ref 98–111)
Creatinine, Ser: 6.66 mg/dL — ABNORMAL HIGH (ref 0.61–1.24)
GFR calc Af Amer: 8 mL/min — ABNORMAL LOW (ref >60)
GFR calc non Af Amer: 7 mL/min — ABNORMAL LOW (ref >60)
GLUCOSE: 165 mg/dL — AB (ref 70–99)
POTASSIUM: 3.5 mmol/L (ref 3.5–5.1)
Sodium: 139 mmol/L (ref 135–145)
TOTAL PROTEIN: 6.8 g/dL (ref 6.5–8.1)

## 2017-09-06 LAB — GLUCOSE, CAPILLARY: Glucose-Capillary: 136 mg/dL — ABNORMAL HIGH (ref 70–99)

## 2017-09-06 LAB — LACTIC ACID, PLASMA
LACTIC ACID, VENOUS: 1.5 mmol/L (ref 0.5–1.9)
Lactic Acid, Venous: 1.4 mmol/L (ref 0.5–1.9)

## 2017-09-06 LAB — TROPONIN I: Troponin I: <0.03 ng/mL (ref <0.03)

## 2017-09-06 MED ORDER — HEPARIN SODIUM (PORCINE) 5000 UNIT/ML IJ SOLN
5000.0000 [IU] | Freq: Three times a day (TID) | INTRAMUSCULAR | Status: DC
  Administered 2017-09-06 – 2017-09-13 (×20): 5000 [IU] via SUBCUTANEOUS
  Filled 2017-09-06 (×20): qty 1

## 2017-09-06 MED ORDER — ACETAMINOPHEN 650 MG RE SUPP: 650.0000 mg | Freq: Four times a day (QID) | RECTAL | Status: DC | PRN

## 2017-09-06 MED ORDER — ONDANSETRON HCL 4 MG/2ML IJ SOLN: 4.0000 mg | Freq: Four times a day (QID) | INTRAMUSCULAR | Status: DC | PRN

## 2017-09-06 MED ORDER — SODIUM CHLORIDE 0.9 % IV SOLN
INTRAVENOUS | Status: AC
  Administered 2017-09-06: 17:00:00 via INTRAVENOUS

## 2017-09-06 MED ORDER — FERROUS SULFATE 325 (65 FE) MG PO TABS
325.0000 mg | ORAL_TABLET | Freq: Every day | ORAL | Status: DC
  Administered 2017-09-07 – 2017-09-13 (×7): 325 mg via ORAL
  Filled 2017-09-06 (×7): qty 1

## 2017-09-06 MED ORDER — FINASTERIDE 5 MG PO TABS
5.0000 mg | ORAL_TABLET | Freq: Every day | ORAL | Status: DC
  Administered 2017-09-06 – 2017-09-13 (×8): 5 mg via ORAL
  Filled 2017-09-06 (×10): qty 1

## 2017-09-06 MED ORDER — ALLOPURINOL 100 MG PO TABS
100.0000 mg | ORAL_TABLET | Freq: Every day | ORAL | Status: DC
  Administered 2017-09-06 – 2017-09-13 (×8): 100 mg via ORAL
  Filled 2017-09-06 (×8): qty 1

## 2017-09-06 MED ORDER — ASPIRIN EC 81 MG PO TBEC
81.0000 mg | DELAYED_RELEASE_TABLET | Freq: Every day | ORAL | Status: DC
  Administered 2017-09-06 – 2017-09-13 (×8): 81 mg via ORAL
  Filled 2017-09-06 (×8): qty 1

## 2017-09-06 MED ORDER — SENNOSIDES-DOCUSATE SODIUM 8.6-50 MG PO TABS: 1.0000 | ORAL_TABLET | Freq: Every evening | ORAL | Status: DC | PRN

## 2017-09-06 MED ORDER — TRAMADOL HCL 50 MG PO TABS
50.0000 mg | ORAL_TABLET | Freq: Two times a day (BID) | ORAL | Status: DC | PRN
  Administered 2017-09-10 – 2017-09-11 (×2): 50 mg via ORAL
  Filled 2017-09-06 (×2): qty 1

## 2017-09-06 MED ORDER — CALCITRIOL 0.25 MCG PO CAPS
0.2500 ug | ORAL_CAPSULE | Freq: Every day | ORAL | Status: DC
  Administered 2017-09-06 – 2017-09-13 (×8): 0.25 ug via ORAL
  Filled 2017-09-06 (×8): qty 1

## 2017-09-06 MED ORDER — INSULIN ASPART 100 UNIT/ML ~~LOC~~ SOLN
0.0000 [IU] | Freq: Three times a day (TID) | SUBCUTANEOUS | Status: DC
  Administered 2017-09-09 – 2017-09-13 (×6): 1 [IU] via SUBCUTANEOUS

## 2017-09-06 MED ORDER — ONDANSETRON HCL 4 MG PO TABS: 4.0000 mg | ORAL_TABLET | Freq: Four times a day (QID) | ORAL | Status: DC | PRN

## 2017-09-06 MED ORDER — ACETAMINOPHEN 325 MG PO TABS
650.0000 mg | ORAL_TABLET | Freq: Four times a day (QID) | ORAL | Status: DC | PRN
  Administered 2017-09-07: 650 mg via ORAL
  Filled 2017-09-06: qty 2

## 2017-09-06 MED ORDER — LABETALOL HCL 200 MG PO TABS
200.0000 mg | ORAL_TABLET | Freq: Two times a day (BID) | ORAL | Status: DC
  Administered 2017-09-06 – 2017-09-13 (×14): 200 mg via ORAL
  Filled 2017-09-06 (×14): qty 1

## 2017-09-06 MED ORDER — IRBESARTAN 300 MG PO TABS
300.0000 mg | ORAL_TABLET | Freq: Every day | ORAL | Status: DC
  Administered 2017-09-06 – 2017-09-07 (×2): 300 mg via ORAL
  Filled 2017-09-06 (×2): qty 1

## 2017-09-06 NOTE — ED Provider Notes (Signed)
[facility] EMERGENCY DEPARTMENT Provider Note   CSN: [id number] Arrival date & time: 09/06/17  1352     History   Chief Complaint Chief Complaint  Patient presents with  . Weakness    HPI Miguel Tucker is a 84 y.o. male.  HPI  Pt was seen at 1430. Per pt's family, c/o sudden onset and resolution of one episode of syncope 2 days ago. Pt's family states they were sitting in a chair talking when pt "passed out." Pt awoke after a short time, no confusion, no incont, no seizure activity. Pt has been "too weak to stand or walk" for the past 2 days, worse today. Pt denies any specific complaint. Denies CP/palpitations, no SOB/cough, no abd pain, no N/V/D, no fevers, no falls, no rash, no focal motor weakness, no tingling/numbness in extremities.   Past Medical History:  Diagnosis Date  . Asthma   . Chronic kidney disease   . Chronic knee pain   . Diabetes mellitus    type 2  . Frequent urination at night   . Gout   . Hard of hearing   . High cholesterol   . Hypertension   . Hypokalemia   . Radicular pain of left lower extremity     Patient Active Problem List   Diagnosis Date Noted  . Onychomycosis 04/11/2017  . Hammer toe 04/11/2017  . Diabetes mellitus ([place]) 04/11/2017  . Osteoarthritis of knee 02/11/2017  . Difficulty in walking(719.7) 12/09/2011  . Radicular pain of left lower extremity 12/09/2011  . Wound healing, delayed 11/06/2010    Past Surgical History:  Procedure Laterality Date  . APPENDECTOMY    . BASCILIC VEIN TRANSPOSITION Left 09/09/2015   Procedure: FIRST STAGE BRACHIAL VEIN TRANSPOSITION LEFT ARM;  Surgeon: Elam Dutch, MD;  Location: [place];  Service: Vascular;  Laterality: Left;  . BASCILIC VEIN TRANSPOSITION Left 02/20/2016   Procedure: LEFT ARM SECOND STAGE BASILIC VEIN TRANSPOSITION;  Surgeon: Rosetta Posner, MD;  Location: [place];  Service: Vascular;  Laterality: Left;  . OTHER SURGICAL HISTORY     testicular tumor- benign   . ROTATOR  CUFF REPAIR Right   . WOUND DEBRIDEMENT  06/01/2011   Procedure: DEBRIDEMENT ABDOMINAL WOUND;  Surgeon: [name], MD;  Location: [facility];  Service: General;  Laterality: N/A;  Remove Sutures         Home Medications    Prior to Admission medications   Medication Sig Start Date End Date Taking? Authorizing Provider  allopurinol (ZYLOPRIM) 100 MG tablet Take 100 mg by mouth daily.  07/23/15  Yes [provider]  aspirin EC 81 MG tablet Take 81 mg by mouth every morning.    Yes [provider]  calcitRIOL (ROCALTROL) 0.25 MCG capsule Take 0.25 mcg by mouth daily.  07/23/15  Yes [provider]  ferrous sulfate 325 (65 FE) MG tablet Take 325 mg by mouth daily with breakfast.   Yes [provider]  finasteride (PROSCAR) 5 MG tablet Take 5 mg by mouth daily.   Yes [provider]  furosemide (LASIX) 40 MG tablet Take 40 mg by mouth daily.  07/24/15  Yes [provider]  labetalol (NORMODYNE) 200 MG tablet Take 200 mg by mouth 2 (two) times daily.  07/23/15  Yes [provider]  traMADol (ULTRAM) 50 MG tablet Take 1 tablet (50 mg total) by mouth every 6 (six) hours as needed. 08/19/17  Yes Lily Kocher, PA-C  valsartan (DIOVAN) 160 MG tablet  Take 160 mg by mouth 2 (two) times daily. 07/10/15  Yes [provider]  [place]. Devices (ADJUSTABLE ALUMINUM CANE) MISC Use the cane as needed for weight bearing 06/22/17   Kem Parkinson, PA-C    Family History History reviewed. No pertinent family history.  Social History Social History   Tobacco Use  . Smoking status: Former Smoker    Last attempt to quit: 11/06/1975    Years since quitting: 41.8  . Smokeless tobacco: Never Used  Substance Use Topics  . Alcohol use: No  . Drug use: No     Allergies   Penicillins   Review of Systems Review of Systems ROS: Statement: All systems negative except as marked or noted in the HPI; Constitutional: Negative for fever and chills.  ; ; Eyes: Negative for eye pain, redness and discharge. ; ; ENMT: Negative for ear pain, hoarseness, nasal congestion, sinus pressure and sore throat. ; ; Cardiovascular: Negative for chest pain, palpitations, diaphoresis, dyspnea and peripheral edema. ; ; Respiratory: Negative for cough, wheezing and stridor. ; ; Gastrointestinal: Negative for nausea, vomiting, diarrhea, abdominal pain, blood in stool, hematemesis, jaundice and rectal bleeding. . ; ; Genitourinary: Negative for dysuria, flank pain and hematuria. ; ; Musculoskeletal: Negative for back pain and neck pain. Negative for swelling and trauma.; ; Skin: Negative for pruritus, rash, abrasions, blisters, bruising and skin lesion.; ; Neuro: Negative for headache, lightheadedness and neck stiffness. Negative for extremity weakness, paresthesias, involuntary movement, seizure and +syncope, generalized weakness.      Physical Exam Updated Vital Signs BP (!) 142/84 (BP Location: Right Arm)   Pulse 78   Temp 99.5 F (37.5 C) (Oral)   Resp 20   SpO2 100%   14:50 Orthostatic Vital Signs TH  Orthostatic Lying   BP- Lying: 138/75   Pulse- Lying: 76       Orthostatic Sitting  BP- Sitting: 126/78   Pulse- Sitting: 86   14:53:32 ED Notes TH  I was not able to get the blood pressure while the pt was standing due to him being too weak.   1435: Physical examination:  Nursing notes reviewed; Vital signs and O2 SAT reviewed;  Constitutional: Well developed, Well nourished, Well hydrated, In no acute distress; Head:  Normocephalic, atraumatic; Eyes: EOMI, PERRL, No scleral icterus; ENMT: Mouth and pharynx normal, Mucous membranes moist; Neck: Supple, Full range of motion, No lymphadenopathy; Cardiovascular: Regular rate and rhythm, No gallop; Respiratory: Breath sounds clear & equal bilaterally, No wheezes.  Speaking full sentences with ease, Normal respiratory effort/excursion; Chest: Nontender, Movement normal; Abdomen: Soft, Nontender,  Nondistended, Normal bowel sounds; Genitourinary: No CVA tenderness; Spine:  No midline CS, TS, LS tenderness.;; Extremities: Peripheral pulses normal, No tenderness, No edema, No calf edema or asymmetry.; Neuro: Awake, alert. No facial droop. +HOH. Speech clear. Grips equal. Strength 4/5 equal bilat UE's and LE's. Pt moves all extremities spontaneously and to command without apparent gross focal motor deficits.; Skin: Color normal, Warm, Dry.    ED Treatments / Results  Labs (all labs ordered are listed, but only abnormal results are displayed)   EKG EKG Interpretation  Date/Time:  Tuesday September 06 2017 13:59:00 EDT Ventricular Rate:  78 PR Interval:    QRS Duration: 99 QT Interval:  393 QTC Calculation: 448 R Axis:   -58 Text Interpretation:  Sinus rhythm Left axis deviation Left anterior fascicular block Abnormal R-wave progression, early transition Left ventricular hypertrophy Anterior Q waves, possibly due to LVH Baseline wander When compared with  ECG of 09/04/2017 No significant change was found Confirmed by Francine Graven [phone]) on 09/06/2017 3:15:24 PM   Radiology   Procedures Procedures (including critical care time)  Medications Ordered in ED Medications - No data to display   Initial Impression / Assessment and Plan / ED Course  I have reviewed the triage vital signs and the nursing notes.  Pertinent labs & imaging results that were available during my care of the patient were reviewed by me and considered in my medical decision making (see chart for details).  MDM Reviewed:  previous chart, nursing note and vitals Reviewed previous: labs and ECG Interpretation: labs, ECG, x-ray and CT scan    Results for orders placed or performed during the hospital encounter of 09/06/17  Urinalysis, Routine w reflex microscopic  Result Value Ref Range   Color, Urine YELLOW YELLOW   APPearance CLEAR CLEAR   Specific Gravity, Urine 1.011 1.005 - 1.030   pH 5.0 5.0 -  8.0   Glucose, UA NEGATIVE NEGATIVE mg/dL   Hgb urine dipstick MODERATE (A) NEGATIVE   Bilirubin Urine NEGATIVE NEGATIVE   Ketones, ur NEGATIVE NEGATIVE mg/dL   Protein, ur 100 (A) NEGATIVE mg/dL   Nitrite NEGATIVE NEGATIVE   Leukocytes, UA NEGATIVE NEGATIVE   RBC / HPF 0-5 0 - 5 RBC/hpf   WBC, UA 0-5 0 - 5 WBC/hpf   Bacteria, UA NONE SEEN NONE SEEN   Mucus PRESENT    Granular Casts, UA PRESENT   Comprehensive metabolic panel  Result Value Ref Range   Sodium 139 135 - 145 mmol/L   Potassium 3.5 3.5 - 5.1 mmol/L   Chloride 108 98 - 111 mmol/L   CO2 21 (L) 22 - 32 mmol/L   Glucose, Bld 165 (H) 70 - 99 mg/dL   BUN 49 (H) 8 - 23 mg/dL   Creatinine, Ser 6.66 (H) 0.61 - 1.24 mg/dL   Calcium 9.4 8.9 - 10.3 mg/dL   Total Protein 6.8 6.5 - 8.1 g/dL   Albumin 3.2 (L) 3.5 - 5.0 g/dL   AST 13 (L) 15 - 41 U/L   ALT 13 0 - 44 U/L   Alkaline Phosphatase 75 38 - 126 U/L   Total Bilirubin 0.8 0.3 - 1.2 mg/dL   GFR calc non Af Amer 7 (L) >60 mL/min   GFR calc Af Amer 8 (L) >60 mL/min   Anion gap 10 5 - 15  Troponin I  Result Value Ref Range   Troponin I <0.03 <0.03 ng/mL  Lactic acid, plasma  Result Value Ref Range   Lactic Acid, Venous 1.4 0.5 - 1.9 mmol/L  CBC with Differential  Result Value Ref Range   WBC 12.9 (H) 4.0 - 10.5 K/uL   RBC 3.69 (L) 4.22 - 5.81 MIL/uL   Hemoglobin 10.1 (L) 13.0 - 17.0 g/dL   HCT 31.1 (L) 39.0 - 52.0 %   MCV 84.3 78.0 - 100.0 fL   MCH 27.4 26.0 - 34.0 pg   MCHC 32.5 30.0 - 36.0 g/dL   RDW 16.9 (H) 11.5 - 15.5 %   Platelets 214 150 - 400 K/uL   Neutrophils Relative % 81 %   Neutro Abs 10.5 (H) 1.7 - 7.7 K/uL   Lymphocytes Relative 8 %   Lymphs Abs 1.0 0.7 - 4.0 K/uL   Monocytes Relative 11 %   Monocytes Absolute 1.4 (H) 0.1 - 1.0 K/uL   Eosinophils Relative 0 %   Eosinophils Absolute 0.0 0.0 - 0.7 K/uL  Basophils Relative 0 %   Basophils Absolute 0.0 0.0 - 0.1 K/uL   Dg Chest 2 View Result Date: 09/06/2017 CLINICAL DATA:  Generalized weakness  EXAM: CHEST - 2 VIEW COMPARISON:  09/04/2017, 05/27/2011 FINDINGS: No focal airspace disease. Possible tiny left pleural effusion. Cardiomediastinal silhouette within normal limits. No pneumothorax. Clips in the left proximal arm. IMPRESSION: Possible tiny left pleural effusion. Electronically Signed   By: Donavan Foil [name]   On: 09/06/2017 15:57   Ct Head Wo Contrast Result Date: 09/06/2017 CLINICAL DATA:  Generalized weakness. EXAM: CT HEAD WITHOUT CONTRAST TECHNIQUE: Contiguous axial images were obtained from the base of the skull through the vertex without intravenous contrast. COMPARISON:  April 28, 2017 FINDINGS: Brain: No subdural, epidural, or subarachnoid hemorrhage. Cerebellum, brainstem, and basal cisterns normal. White matter changes noted. No mass effect or midline shift. No acute ischemia or infarct. Ventricular and sulcal prominence is stable, consistent with volume loss. Vascular: Calcified atherosclerosis in the intracranial carotids. Skull: Normal. Negative for fracture or focal lesion. Sinuses/Orbits: No acute finding. Other: None. IMPRESSION: Chronic white matter changes and volume loss. No acute intracranial abnormalities. Electronically Signed   By: Dorise Bullion III M.D   On: 09/06/2017 16:12    Results for CASIMIR, BARCELLOS (MRN [id number]) as of 09/06/2017 16:44  Ref. Range 07/04/2017 10:43 08/01/2017 12:38 08/29/2017 11:52 09/04/2017 15:58 09/06/2017 15:18  Hemoglobin Latest Ref Range: 13.0 - 17.0 g/dL 10.0 (L) 9.8 (L) 10.5 (L) 10.4 (L) 10.1 (L)  HCT Latest Ref Range: 39.0 - 52.0 %    32.0 (L) 31.1 (L)    Results for ROMMIE, DUNN (MRN [id number]) as of 09/06/2017 16:44  Ref. Range 08/01/2017 12:30 08/29/2017 11:41 09/04/2017 15:58 09/06/2017 15:18  BUN Latest Ref Range: 8 - 23 mg/dL 42 (H) 55 (H) 46 (H) 49 (H)  Creatinine Latest Ref Range: 0.61 - 1.24 mg/dL 5.54 (H) 5.71 (H) 6.28 (H) 6.66 (H)    1730: Pt unable to stand for orthostatic VS. H/H per baseline. BUN/Cr elevated from  baseline; judicious IVF given. Family concerned regarding discharge.  T/C returned from [facility] Dr. Wynetta Emery, case discussed, including:  HPI, pertinent PM/SHx, VS/PE, dx testing, ED course and treatment:  Agreeable to come to ED for evaluation for admission.            Final Clinical Impressions(s) / ED Diagnoses   Final diagnoses:  None    ED Discharge Orders    None      Francine Graven, DO 09/09/17 2952

## 2017-09-06 NOTE — H&P (Signed)
History and Physical    Miguel Tucker MWU:[id number] DOB: 12/30/1930 DOA: 09/06/2017  PCP: [name], MD (Confirm with patient/family/NH records and if not entered, this has to be entered at [facility] point of entry) Patient coming from: Home Nephro: Dr Rolan Lipa   I have personally briefly reviewed patient's old medical records in [place]  Chief Complaint: weakness  HPI: Miguel Tucker is a 84 y.o. male with medical history significant of htn, ESRD not on HD< Dementia, presents with weakness.  Patient lives at home with his wife who has dementia as well.  They do have a few services including Meals on Wheels and home health.  Since Sunday patient has been a unable to walk on his own.  He denies any focal weakness.  He does have baseline dementia.  Patient has son Jori Moll who lives in [place] and is a caregiver for his granddaughter there.  He is here currently.  HE will be leaving in the morning.  Also son Jori Moll who lives in the area who be here to help make decisions.  Patient is quiet and does not complain of anything really.  He picks at his meals does not drink very much at times per what I can gather from family.  Wife has dementia and repeats herself quite a lot so she is not quite a great historian either.  Patient found to have worsening renal function.  Creatinine 6.6 yesterday creatinine was 5.54 89-month ago.  He see Dr. Harriette Bouillon  Renal. He has first stage completed for fistula placement.   That they have an appointment this week to see her.  No infections identified. He has small wbc of 12k .   Hemoglobin at baseline.  Review of Systems: unable to walk All others reviewed with patient  and are  negative unless otherwise stated   Past Medical History:  Diagnosis Date  . Asthma   . Chronic kidney disease   . Chronic knee pain   . Dementia   . Diabetes mellitus    type 2  . Frequent urination at night   . Gout   . Hard of hearing   . High cholesterol    . Hypertension   . Hypokalemia   . Radicular pain of left lower extremity     Past Surgical History:  Procedure Laterality Date  . APPENDECTOMY    . BASCILIC VEIN TRANSPOSITION Left 09/09/2015   Procedure: FIRST STAGE BRACHIAL VEIN TRANSPOSITION LEFT ARM;  Surgeon: Elam Dutch, MD;  Location: [place];  Service: Vascular;  Laterality: Left;  . BASCILIC VEIN TRANSPOSITION Left 02/20/2016   Procedure: LEFT ARM SECOND STAGE BASILIC VEIN TRANSPOSITION;  Surgeon: Rosetta Posner, MD;  Location: [facility];  Service: Vascular;  Laterality: Left;  . OTHER SURGICAL HISTORY     testicular tumor- benign   . ROTATOR CUFF REPAIR Right   . WOUND DEBRIDEMENT  06/01/2011   Procedure: DEBRIDEMENT ABDOMINAL WOUND;  Surgeon: [name], MD;  Location: [facility];  Service: General;  Laterality: N/A;  Remove Sutures      reports that he quit smoking about 41 years ago. He has never used smokeless tobacco. He reports that he does not drink alcohol or use drugs.  Allergies  Allergen Reactions  . Penicillins Itching and Other (See Comments)    On arms only. Has patient had a PCN reaction causing immediate rash, facial/tongue/throat swelling, SOB or lightheadedness with hypotension: No Has patient had a PCN reaction causing  severe rash involving mucus membranes or skin necrosis: No Has patient had a PCN reaction that required hospitalization No Has patient had a PCN reaction occurring within the last 10 years: No If all of the above answers are "NO", then may proceed with Cephalosporin use.     History reviewed. No pertinent family history.   Prior to Admission medications   Medication Sig Start Date End Date Taking? Authorizing Provider  allopurinol (ZYLOPRIM) 100 MG tablet Take 100 mg by mouth daily.  07/23/15  Yes [provider]  aspirin EC 81 MG tablet Take 81 mg by mouth every morning.    Yes [provider]  calcitRIOL (ROCALTROL) 0.25 MCG capsule Take 0.25 mcg by mouth daily.   07/23/15  Yes [provider]  ferrous sulfate 325 (65 FE) MG tablet Take 325 mg by mouth daily with breakfast.   Yes [provider]  finasteride (PROSCAR) 5 MG tablet Take 5 mg by mouth daily.   Yes [provider]  furosemide (LASIX) 40 MG tablet Take 40 mg by mouth daily.  07/24/15  Yes [provider]  labetalol (NORMODYNE) 200 MG tablet Take 200 mg by mouth 2 (two) times daily.  07/23/15  Yes [provider]  traMADol (ULTRAM) 50 MG tablet Take 1 tablet (50 mg total) by mouth every 6 (six) hours as needed. 08/19/17  Yes Lily Kocher, PA-C  valsartan (DIOVAN) 160 MG tablet Take 160 mg by mouth 2 (two) times daily. 07/10/15  Yes [provider]  [place]. Devices (ADJUSTABLE ALUMINUM CANE) MISC Use the cane as needed for weight bearing 06/22/17   Kem Parkinson, PA-C    Physical Exam: Vitals:   09/06/17 1359  BP: (!) 142/84  Pulse: 78  Resp: 20  Temp: 99.5 F (37.5 C)  TempSrc: Oral  SpO2: 100%    Constitutional: NAD, calm, comfortable Vitals:   09/06/17 1359  BP: (!) 142/84  Pulse: 78  Resp: 20  Temp: 99.5 F (37.5 C)  TempSrc: Oral  SpO2: 100%   Eyes: PERRL, lids and conjunctivae normal ENMT: Mucous membranes are moist. Posterior pharynx clear of any exudate or lesions.Normal dentition.  Neck: normal, supple, no masses,  Respiratory: clear to auscultation bilaterally, no wheezing, no crackles. Normal respiratory effort. No accessory muscle use.  Cardiovascular: Regular rate and rhythm, no murmurs / rubs / gallops. No extremity edema. 2+ pedal pulses.  Abdomen: no tenderness, no masses palpated. No hepatosplenomegaly. Bowel sounds positive.  Musculoskeletal: no clubbing / cyanosis. No joint deformity upper and lower extremities. Good ROM LE, no contractures. Normal muscle tone. Fistula left arm   Skin: no rashes, lesions, ulcers. No induration Neurologic: CN 2-12 grossly intact.  Strength 4+/5 in all 4.  gen quite  weak Psychiatric: very pleasant , doesn't say much . Alert and oriented  Self, birth month and day, not current year    (Labs on Admission: I have personally reviewed following labs and imaging studies  CBC: Recent Labs  Lab 09/04/17 1558 09/06/17 1518  WBC 12.5* 12.9*  NEUTROABS 10.3* 10.5*  HGB 10.4* 10.1*  HCT 32.0* 31.1*  MCV 84.9 84.3  PLT 224 756   Basic Metabolic Panel: Recent Labs  Lab 09/04/17 1558 09/06/17 1518  NA 139 139  K 3.3* 3.5  CL 108 108  CO2 19* 21*  GLUCOSE 149* 165*  BUN 46* 49*  CREATININE 6.28* 6.66*  CALCIUM 9.3 9.4   GFR: CrCl cannot be calculated (Unknown ideal weight.). Liver Function Tests: Recent Labs  Lab 09/04/17 1558 09/06/17 1518  AST 10* 13*  ALT 12 13  ALKPHOS 72 75  BILITOT 0.8 0.8  PROT 6.6 6.8  ALBUMIN 3.5 3.2*   No results for input(s): LIPASE, AMYLASE in the last 168 hours. No results for input(s): AMMONIA in the last 168 hours. Coagulation Profile: No results for input(s): INR, PROTIME in the last 168 hours. Cardiac Enzymes: Recent Labs  Lab 09/06/17 1518  TROPONINI <0.03   BNP (last 3 results) No results for input(s): PROBNP in the last 8760 hours. HbA1C: No results for input(s): HGBA1C in the last 72 hours. CBG: No results for input(s): GLUCAP in the last 168 hours. Lipid Profile: No results for input(s): CHOL, HDL, LDLCALC, TRIG, CHOLHDL, LDLDIRECT in the last 72 hours. Thyroid Function Tests: No results for input(s): TSH, T4TOTAL, FREET4, T3FREE, THYROIDAB in the last 72 hours. Anemia Panel: No results for input(s): VITAMINB12, FOLATE, FERRITIN, TIBC, IRON, RETICCTPCT in the last 72 hours. Urine analysis:    Component Value Date/Time   COLORURINE YELLOW 09/06/2017 1600   APPEARANCEUR CLEAR 09/06/2017 1600   LABSPEC 1.011 09/06/2017 1600   PHURINE 5.0 09/06/2017 1600   GLUCOSEU NEGATIVE 09/06/2017 1600   HGBUR MODERATE (A) 09/06/2017 1600   BILIRUBINUR NEGATIVE 09/06/2017 1600   KETONESUR  NEGATIVE 09/06/2017 1600   PROTEINUR 100 (A) 09/06/2017 1600   UROBILINOGEN 0.2 02/08/2008 0204   NITRITE NEGATIVE 09/06/2017 1600   LEUKOCYTESUR NEGATIVE 09/06/2017 1600    Radiological Exams on Admission: Dg Chest 2 View  Result Date: 09/06/2017 CLINICAL DATA:  Generalized weakness EXAM: CHEST - 2 VIEW COMPARISON:  09/04/2017, 05/27/2011 FINDINGS: No focal airspace disease. Possible tiny left pleural effusion. Cardiomediastinal silhouette within normal limits. No pneumothorax. Clips in the left proximal arm. IMPRESSION: Possible tiny left pleural effusion. Electronically Signed   By: Donavan Foil [name]   On: 09/06/2017 15:57   Ct Head Wo Contrast  Result Date: 09/06/2017 CLINICAL DATA:  Generalized weakness. EXAM: CT HEAD WITHOUT CONTRAST TECHNIQUE: Contiguous axial images were obtained from the base of the skull through the vertex without intravenous contrast. COMPARISON:  April 28, 2017 FINDINGS: Brain: No subdural, epidural, or subarachnoid hemorrhage. Cerebellum, brainstem, and basal cisterns normal. White matter changes noted. No mass effect or midline shift. No acute ischemia or infarct. Ventricular and sulcal prominence is stable, consistent with volume loss. Vascular: Calcified atherosclerosis in the intracranial carotids. Skull: Normal. Negative for fracture or focal lesion. Sinuses/Orbits: No acute finding. Other: None. IMPRESSION: Chronic white matter changes and volume loss. No acute intracranial abnormalities. Electronically Signed   By: Dorise Bullion III M.D   On: 09/06/2017 16:12    EKG: Independently reviewed. SR rate 78 LVH   Assessment/Plan Principal Problem:   Weakness generalized Active Problems:   Diabetes mellitus (HCC)   ESRD (end stage renal disease) (HCC)   HTN (hypertension)   Anemia, chronic disease   - acute on CKD> crt 5.71 8/26 6.28 9/1. Gentle IVF< hold lasix for now. -Cont ARB. Consider consulting nephrology., he does first stage  fistula left arm  -Carb  consistent diet, sliding scale insulin , check hemoglobin A1c --Continue home medications for hypertension -Hemoglobin  baseline he does receive some injection per nephrology. -Consult physical therapy and case management.  Patient is generally weak with no focal abnormalities suspect he would not be able to go home safely. I had  A long discussion with both patient's sons who recognize that the mother has dementia and their father is progressing as well they  would like to speak with case management for further home health versus placement options. - DVT prophylaxis: Heparin Code Status: Full  Family Communication:  Sons, Quillian Quince and Jori Moll ( lives in [place])  Disposition Plan: d/c SNF   Admission status: Obs medical     Daira Hine Johnson-Pitts MD [facility] Pager [phone]  If 7PM-7AM, please contact night-coverage [email or web address] Password [facility]  09/06/2017, 6:25 PM

## 2017-09-06 NOTE — ED Notes (Signed)
I was not able to get the blood pressure while the pt was standing due to him being too weak.

## 2017-09-06 NOTE — ED Triage Notes (Addendum)
Pt arrived by ems.here Sunday for same. Weakness all over. Wife states pt has been the same the last 6 hours. A/o. Denies pain. No neuro deficits noted. cbg  In route 202

## 2017-09-06 NOTE — ED Notes (Signed)
Pt urinated in a urinal so we did not have to do an in and out cath.

## 2017-09-06 NOTE — ED Notes (Signed)
Pt was informed that we need a urine sample. Pt states that he can not urinate at this time. 

## 2017-09-07 ENCOUNTER — Other Ambulatory Visit: Payer: Self-pay

## 2017-09-07 DIAGNOSIS — R69 Illness, unspecified: Secondary | ICD-10-CM | POA: Diagnosis not present

## 2017-09-07 DIAGNOSIS — N179 Acute kidney failure, unspecified: Secondary | ICD-10-CM

## 2017-09-07 DIAGNOSIS — E1122 Type 2 diabetes mellitus with diabetic chronic kidney disease: Secondary | ICD-10-CM | POA: Diagnosis not present

## 2017-09-07 DIAGNOSIS — D638 Anemia in other chronic diseases classified elsewhere: Secondary | ICD-10-CM | POA: Diagnosis not present

## 2017-09-07 DIAGNOSIS — N189 Chronic kidney disease, unspecified: Secondary | ICD-10-CM | POA: Diagnosis not present

## 2017-09-07 DIAGNOSIS — I10 Essential (primary) hypertension: Secondary | ICD-10-CM | POA: Diagnosis not present

## 2017-09-07 DIAGNOSIS — R531 Weakness: Secondary | ICD-10-CM | POA: Diagnosis not present

## 2017-09-07 DIAGNOSIS — N185 Chronic kidney disease, stage 5: Secondary | ICD-10-CM | POA: Diagnosis not present

## 2017-09-07 LAB — URINE CULTURE: Culture: NO GROWTH

## 2017-09-07 LAB — BASIC METABOLIC PANEL
Anion gap: 12 (ref 5–15)
BUN: 48 mg/dL — AB (ref 8–23)
CHLORIDE: 110 mmol/L (ref 98–111)
CO2: 19 mmol/L — AB (ref 22–32)
Calcium: 9.6 mg/dL (ref 8.9–10.3)
Creatinine, Ser: 6.55 mg/dL — ABNORMAL HIGH (ref 0.61–1.24)
GFR calc Af Amer: 8 mL/min — ABNORMAL LOW (ref >60)
GFR calc non Af Amer: 7 mL/min — ABNORMAL LOW (ref >60)
GLUCOSE: 107 mg/dL — AB (ref 70–99)
POTASSIUM: 3.3 mmol/L — AB (ref 3.5–5.1)
Sodium: 141 mmol/L (ref 135–145)

## 2017-09-07 LAB — HEMOGLOBIN A1C
Hgb A1c MFr Bld: 5.9 % — ABNORMAL HIGH (ref 4.8–5.6)
Mean Plasma Glucose: 122.63 mg/dL

## 2017-09-07 LAB — GLUCOSE, CAPILLARY
Glucose-Capillary: 103 mg/dL — ABNORMAL HIGH (ref 70–99)
Glucose-Capillary: 104 mg/dL — ABNORMAL HIGH (ref 70–99)

## 2017-09-07 LAB — MRSA PCR SCREENING: MRSA by PCR: NEGATIVE

## 2017-09-07 MED ORDER — SODIUM CHLORIDE 0.9 % IV SOLN
INTRAVENOUS | Status: AC
  Administered 2017-09-07 – 2017-09-08 (×2): via INTRAVENOUS

## 2017-09-07 MED ORDER — POTASSIUM CHLORIDE 20 MEQ PO PACK
20.0000 meq | PACK | Freq: Once | ORAL | Status: AC
  Administered 2017-09-07: 20 meq via ORAL
  Filled 2017-09-07: qty 1

## 2017-09-07 NOTE — Clinical Social Work Note (Signed)
Clinical Social Work Assessment  Patient Details  Name: Miguel Tucker MRN: [id number] Date of Birth: 1930-07-24  Date of referral:  09/07/17               Reason for consult:  Facility Placement                Permission sought to share information with:    Permission granted to share information::     Name::        Agency::     Relationship::     Contact Information:  Son, Miguel Monteverde, Jr.   Housing/Transportation Living arrangements for the past 2 months:  Single Family Home Source of Information:  Adult Children Patient Interpreter Needed:  None Criminal Activity/Legal Involvement Pertinent to Current Situation/Hospitalization:  No - Comment as needed Significant Relationships:  Adult Children, Other Family Members, Spouse Lives with:  Spouse Do you feel safe going back to the place where you live?  Yes Need for family participation in patient care:  Yes (Comment)  Care giving concerns:  Patient's wife has dementia. Patient has not been diagnosed with dementia per son but he displays the symptoms of dementia. Mr. Miguel Cornelio., states patient "cannot remember yesterday from today."     Social Worker assessment / plan: At baseline, patient ambulates with a walker but has fallen a few times. Per son, Mrs. Miguel, Tucker., patient "does not around well." Patient has problems with his knee.  Patient does not want knee replacement. Patient also did not want to do previously arranged physical therapy in the home.  Son states that patient had been driving and would get lost or run out of gas and sit in the car until someone who knew them stopped got gas and followed him home.  Mr. Miguel Tucker states that he has now taken patient's keys and will be selling his vehicle.   Mr. Miguel Tucker discussed his interest in LTC.  LCSW discussed that patient would have to have a long term care payor source. LCSW provided Mr. Miguel Tucker with the number to DSS to talk with someone about LTC  Medicaid. LCSW advised that the plan at discharge would only be for short term rehab if [facility] authorized it.   Miguel Tucker states that he lives in [place] and that his brother lives in [place] and that patient and wife have minimal supports in the area.  LCSW emailed a SNF List and ALF list to Mr. Miguel Tucker.  Mr. Miguel Tucker stated that he would review the SNF list and advise of his choices of referral facilities.  LCSW request that Mr. Miguel Tucker provide choices as soon as possible because of the [employer] authorization process.   LCSW will follow through discharge.   Employment status:  Retired Nurse, adult PT Recommendations:  [place] / Referral to community resources:  [place]  Patient/Family's Response to care:  Son is agreeable to short term rehab at [facility]. He is also interested in LTC.   Patient/Family's Understanding of and Emotional Response to Diagnosis, Current Treatment, and Prognosis:  Patient's family understands patient's diagnosis, treatment and prognosis.   Emotional Assessment Appearance:  Appears stated age Attitude/Demeanor/Rapport:    Affect (typically observed):  Calm Orientation:  Oriented to Self Alcohol / Substance use:  Not Applicable Psych involvement (Current and /or in the community):  No (Comment)  Discharge Needs  Concerns to be addressed:  Discharge Planning Concerns Readmission within the last 30 days:  No Current discharge  risk:  None Barriers to Discharge:  No Barriers Identified   Ihor Gully, LCSW 09/07/2017, 2:37 PM

## 2017-09-07 NOTE — Progress Notes (Signed)
OT Cancellation Note  Patient Details Name: NICKLAUS ALVIAR MRN: [id number] DOB: 09-15-1930   Cancelled Treatment:    Reason Eval/Treat Not Completed: Patient declined, no reason specified. Attempted to see pt this am. Pt oriented to self only, reporting he lives with his parents, has no wife, etc. Per chart review pt lives with wife, both pt and wife have dementia. This am pt lying supine in bed with hands clasped across chest, awake and answering questions. OT attempted bed mobility and ADLs with pt multiple times in various ways. Pt remains lying in bed refusing any participation when asked why he wouldn't participate he stated "no thanks" or "I don't want to" or "I don't feel like it." OT unable to engage pt in any participation this am. When asked when he would be ready he stated "later today maybe." Pt is unsafe to return home at this time due to cognitive status and unknown mobility status. Will continue to attempt to see pt, suspect will need SNF vs LTC placement at discharge.   [name], OTR/L  [phone] 09/07/2017, 8:53 AM

## 2017-09-07 NOTE — Evaluation (Signed)
Physical Therapy Evaluation Patient Details Name: Miguel Tucker MRN: [id number] DOB: Jan 07, 1930 Today's Date: 09/07/2017   History of Present Illness  84 yo male was referred to PT after having generalized weakness and new complaint of L foot pain.  Pt has recent falls and is unsafe at home as he is not consistently using his walker to ambulate.  PMHx: HTN, CKD5, dementia,DM  Clinical Impression  Pt was seen for evaluation of mobility which was impeded by his poor control of L leg from pain.  He refusedto use LLE and so notified nursing to see if he might need imaging to determine if he has injured or has developed a medical complication either just before admission or since being admitted.  Follow acutely for strength and standing balance and control for gait independence and safety.    Follow Up Recommendations SNF    Equipment Recommendations  None recommended by PT    Recommendations for Other Services       Precautions / Restrictions Precautions Precautions: Fall Precaution Comments: pt will not stand on L foot Restrictions Weight Bearing Restrictions: No      Mobility  Bed Mobility Overal bed mobility: Modified Independent             General bed mobility comments: very slow to go sup to sit  Transfers Overall transfer level: Needs assistance Equipment used: 1 person hand held assist Transfers: Lateral/Scoot Transfers          Lateral/Scoot Transfers: Min assist;Min guard General transfer comment: pt is inconsistent and slow to follow through on cues  Ambulation/Gait             General Gait Details: declined to stand  Stairs            Wheelchair Mobility    Modified Rankin (Stroke Patients Only)       Balance Overall balance assessment: History of Falls;Needs assistance Sitting-balance support: Feet supported Sitting balance-Leahy Scale: Good         Standing balance comment: refused to stand                              Pertinent Vitals/Pain Pain Assessment: Faces Faces Pain Scale: Hurts even more Pain Location: L lower leg and foot Pain Descriptors / Indicators: Sore Pain Intervention(s): Monitored during session;Patient requesting pain meds-RN notified    Home Living Family/patient expects to be discharged to:: Private residence Living Arrangements: Spouse/significant other;Children Available Help at Discharge: Family;Available 24 hours/day Type of Home: House       Home Layout: One level Home Equipment: Walker - 2 wheels Additional Comments: pt is very poor historian and cannot give full details of home    Prior Function Level of Independence: Needs assistance   Gait / Transfers Assistance Needed: was using RW inconsistently and driving  ADL's / Homemaking Assistance Needed: pt unable to give details        Hand Dominance        Extremity/Trunk Assessment   Upper Extremity Assessment Upper Extremity Assessment: Overall WFL for tasks assessed    Lower Extremity Assessment Lower Extremity Assessment: Generalized weakness    Cervical / Trunk Assessment Cervical / Trunk Assessment: Normal  Communication   Communication: No difficulties(pt is slow to respond verbally)  Cognition Arousal/Alertness: Lethargic;Awake/alert Behavior During Therapy: Flat affect Overall Cognitive Status: History of cognitive impairments - at baseline  General Comments      Exercises     Assessment/Plan    PT Assessment Patient needs continued PT services  PT Problem List Decreased strength;Decreased range of motion;Decreased activity tolerance;Decreased mobility;Decreased balance;Decreased coordination;Decreased cognition;Decreased knowledge of use of DME;Decreased safety awareness;Pain       PT Treatment Interventions DME instruction;Gait training;Functional mobility training;Therapeutic activities;Therapeutic exercise;Balance  training;Neuromuscular re-education;Patient/family education    PT Goals (Current goals can be found in the Care Plan section)  Acute Rehab PT Goals Patient Stated Goal: to hurt less PT Goal Formulation: Patient unable to participate in goal setting Time For Goal Achievement: 09/21/17 Potential to Achieve Goals: Good    Frequency Min 2X/week   Barriers to discharge Other (comment) pt is not able to say what the entrance to home is like    Co-evaluation               AM-PAC PT "6 Clicks" Daily Activity  Outcome Measure Difficulty turning over in bed (including adjusting bedclothes, sheets and blankets)?: A Little Difficulty moving from lying on back to sitting on the side of the bed? : A Little Difficulty sitting down on and standing up from a chair with arms (e.g., wheelchair, bedside commode, etc,.)?: Unable Help needed moving to and from a bed to chair (including a wheelchair)?: Total Help needed walking in hospital room?: Total Help needed climbing 3-5 steps with a railing? : Total 6 Click Score: 10    End of Session   Activity Tolerance: Patient limited by pain Patient left: in bed;with call bell/phone within reach;with bed alarm set Nurse Communication: Mobility status PT Visit Diagnosis: Other abnormalities of gait and mobility (R26.89);Muscle weakness (generalized) (M62.81);Pain Pain - Right/Left: Left Pain - part of body: Ankle and joints of foot    Time: 2952-8413 PT Time Calculation (min) (ACUTE ONLY): 23 min   Charges:   PT Evaluation $PT Eval Moderate Complexity: 1 Mod PT Treatments $Therapeutic Activity: 8-22 mins        Miguel Tucker 09/07/2017, 6:00 PM   Miguel Tucker, PT MS Acute Rehab Dept. Number: [facility] [place]

## 2017-09-07 NOTE — Consult Note (Signed)
Miguel Tucker MRN: [id number] DOB/AGE: [birth year] 82 y.o. Primary Care Physician:Overton, Modena Nunnery, MD Admit date: 09/06/2017 Chief Complaint:  Chief Complaint  Patient presents with  . Weakness   HPI: Pt is a 84 year old  male with past  medical history significant of HTN, CKD stage 5 not on renal replacement therapy, Dementia who was brought to ER with c/o with weakness.   HPI dates back to 3-4 days when pt developed weakness, progressive  and since Sunday patient has been a unable to walk on his own.Pt was brought to Er for evaluation Sunday and thena again yesterday. Pt offers no specific complaints Both pt and his wife are poor historian as both have  dementia . NO c/o any focal weakness.   Patient has son Jori Moll who lives in [place] and is a caregiver for his granddaughter there.  He is here currently.  Jori Moll is leaving today.  Patient is quiet and does not complain of anything really.   Pt has poor appetite as per medical records-Pt himself offers no complaints.  Upon evaluation in ER  Patient found to have worsening renal function.  Creatinine 6.6 yesterday with creatinine being  5.54 one month ago. Pt  Dr. Harriette Bouillon  Renal.    Past Medical History:  Diagnosis Date  . Asthma   . Chronic kidney disease   . Chronic knee pain   . Dementia   . Diabetes mellitus    type 2  . Frequent urination at night   . Gout   . Hard of hearing   . High cholesterol   . Hypertension   . Hypokalemia   . Radicular pain of left lower extremity         History reviewed. No pertinent family history.  Social History:  reports that he quit smoking about 41 years ago. He has never used smokeless tobacco. He reports that he does not drink alcohol or use drugs.   Allergies:  Allergies  Allergen Reactions  . Penicillins Itching and Other (See Comments)    On arms only. Has patient had a PCN reaction causing immediate rash, facial/tongue/throat swelling, SOB or lightheadedness with  hypotension: No Has patient had a PCN reaction causing severe rash involving mucus membranes or skin necrosis: No Has patient had a PCN reaction that required hospitalization No Has patient had a PCN reaction occurring within the last 10 years: No If all of the above answers are "NO", then may proceed with Cephalosporin use.     Medications Prior to Admission  Medication Sig Dispense Refill  . allopurinol (ZYLOPRIM) 100 MG tablet Take 100 mg by mouth daily.     [name] aspirin EC 81 MG tablet Take 81 mg by mouth every morning.     . calcitRIOL (ROCALTROL) 0.25 MCG capsule Take 0.25 mcg by mouth daily.     . ferrous sulfate 325 (65 FE) MG tablet Take 325 mg by mouth daily with breakfast.    . finasteride (PROSCAR) 5 MG tablet Take 5 mg by mouth daily.    . furosemide (LASIX) 40 MG tablet Take 40 mg by mouth daily.     [name] labetalol (NORMODYNE) 200 MG tablet Take 200 mg by mouth 2 (two) times daily.     . traMADol (ULTRAM) 50 MG tablet Take 1 tablet (50 mg total) by mouth every 6 (six) hours as needed. 10 tablet 0  . valsartan (DIOVAN) 160 MG tablet Take 160 mg by mouth 2 (two) times daily.    [name]  Misc. Devices (ADJUSTABLE ALUMINUM CANE) MISC Use the cane as needed for weight bearing 1 each 0       WEX:HBZJI from the symptoms mentioned above,there are no other symptoms referable to all systems reviewed.  [name] allopurinol  100 mg Oral Daily  . aspirin EC  81 mg Oral Daily  . calcitRIOL  0.25 mcg Oral Daily  . ferrous sulfate  325 mg Oral Q breakfast  . finasteride  5 mg Oral Daily  . heparin  5,000 Units Subcutaneous Q8H  . insulin aspart  0-9 Units Subcutaneous TID WC  . irbesartan  300 mg Oral Daily  . labetalol  200 mg Oral BID         RCV:ELFYB from the symptoms mentioned above,there are no other symptoms referable to all systems reviewed.  Physical Exam: Vital signs in last 24 hours: Temp:  [98 F (36.7 C)-99.5 F (37.5 C)] 98 F (36.7 C) (09/04 0526) Pulse Rate:  [72-84] 72  (09/04 0526) Resp:  [20] 20 (09/03 1359) BP: (142-154)/(68-84) 144/68 (09/04 0526) SpO2:  [99 %-100 %] 100 % (09/04 0526) Weight:  [70.1 kg] 70.1 kg (09/04 0526) Weight change:  Last BM Date: 09/05/17  Intake/Output from previous day: 09/03 0701 - 09/04 0700 In: 949.2 [P.O.:120; I.V.:829.2] Out: 350 [Urine:350] No intake/output data recorded.   Physical Exam: General- pt is awake,alert,but not  oriented to time place and person Resp- No acute REsp distress, NO Rhonchi CVS- S1S2 regular in rate and rhythm GIT- BS+, soft, NT, ND EXT- NO LE Edema, No Cyanosis CNS- CN 2-12 grossly intact. Moving all 4 extremities Access- AVF( NO bruit/thrill)    Lab Results: CBC Recent Labs    09/04/17 1558 09/06/17 1518  WBC 12.5* 12.9*  HGB 10.4* 10.1*  HCT 32.0* 31.1*  PLT 224 214    BMET Recent Labs    09/06/17 1518 09/07/17 0456  NA 139 141  K 3.5 3.3*  CL 108 110  CO2 21* 19*  GLUCOSE 165* 107*  BUN 49* 48*  CREATININE 6.66* 6.55*  CALCIUM 9.4 9.6   Creat trend 2019 (5.2--6.7)==>6.2--6.6 2018  5.6--5.9 2013  1.8 2012  1.6--1.8 2009   1.4  MICRO Recent Results (from the past 240 hour(s))  Blood culture (routine x 2)     Status: None (Preliminary result)   Collection Time: 09/04/17  3:58 PM  Result Value Ref Range Status   Specimen Description BLOOD LEFT WRIST  Final   Special Requests   Final    BOTTLES DRAWN AEROBIC AND ANAEROBIC Blood Culture adequate volume   Culture   Final    NO GROWTH 3 DAYS Performed at [facility], [address] [place], [place] [address]    Report Status PENDING  Incomplete  Blood culture (routine x 2)     Status: None (Preliminary result)   Collection Time: 09/04/17  5:04 PM  Result Value Ref Range Status   Specimen Description BLOOD RIGHT HAND  Final   Special Requests   Final    BOTTLES DRAWN AEROBIC AND ANAEROBIC Blood Culture adequate volume   Culture   Final    NO GROWTH 3 DAYS Performed at [facility], [address], [place], [place] [address]    Report Status PENDING  Incomplete  Urine culture     Status: None   Collection Time: 09/04/17  5:20 PM  Result Value Ref Range Status   Specimen Description   Final    URINE, CATHETERIZED Performed at [facility]  [facility], [address] [place], [place] [address]    Special Requests   Final    NONE Performed at [facility], [address], [place], [place] [address]    Culture   Final    NO GROWTH Performed at [facility], [place] [address] [place], [place] [address]    Report Status 09/07/2017 FINAL  Final  MRSA PCR Screening     Status: None   Collection Time: 09/06/17  8:01 PM  Result Value Ref Range Status   MRSA by PCR NEGATIVE NEGATIVE Final    Comment:        The GeneXpert MRSA Assay (FDA approved for NASAL specimens only), is one component of a comprehensive MRSA colonization surveillance program. It is not intended to diagnose MRSA infection nor to guide or monitor treatment for MRSA infections. Performed at [facility], [address], [place], [place] [address]       Lab Results  Component Value Date   PTH 120 (H) 08/29/2017   PTH Comment 08/29/2017   CALCIUM 9.6 09/07/2017   PHOS 4.6 08/29/2017      Impression: 1)Renal CKD stage 5.               CKD since 2009               CKD secondary to DM/HTN/age asso decline                Progression of CKD slow                Hematuria none .                Nephrolithiasis Hx Absent                         No acute need of renal replacement therapy                  NO Hyperkalemia/NO acidosis/No Fluid overload  2)HTN  Medication- On RAS blockers. on Alpha and beta Blockers .  3)Anemia HGb not at goal (9--11)  4)CKD Mineral-Bone Disorder PTH elevated . Secondary Hyperparathyroidism present Phosphorus at goal. Calcium is at goal.  5)VNS- hx of dementia PMD following  6)Electrolytes Hypokalemic   Will replete gently  NOrmonatremic   7)Acid base Co2 at  goal     Plan:  Will d/c ARB as GFR less than 52ml/min. Will give KCl. Will suggest to get data from outside nephrologist NO acute need of renal replacement therapy.      Mikkel Charrette S 09/07/2017, 11:21 AM

## 2017-09-07 NOTE — Progress Notes (Signed)
PROGRESS NOTE    Miguel Tucker  QPR:[id number] DOB: 1930-07-04 DOA: 09/06/2017 PCP: [name], MD   Brief Narrative:  Miguel Tucker is a 84 y.o. male with medical history significant of HTN, CKD 5 and having AV fistula, advanced dementia, p/w generalized weakness. Patient lives at home with his wife who has dementia as well.  They do have a few services including Meals on Wheels and home health.  Since Sunday patient has been a unable to walk on his own.  He denies any focal weakness.  his son Jori Moll who lives in the area who be here to help make decisions. Pt is found to have slightly worsening Cr from 5.7 to 6.66 but no evidence of severe acidosis, electrolyte disturbance, intoxication, volume overload or uremia. With IVF, his Cr slightly improved to 6.55, will continue gentle hydration and pending nephrology consult. PT/OT/CM consult for discharge planning.    Assessment & Plan:   Principal Problem:   Weakness generalized Active Problems:   Diabetes mellitus (HCC)   CKD (chronic kidney disease) stage 5, GFR less than 15 ml/min (HCC)   HTN (hypertension)   Anemia, chronic disease   Acute on chronic renal failure (HCC)   Dementia   Plan: -pt has advanced dementia and no current complaints; no evidence of severe acidosis, electrolyte disturbance, intoxication, volume overload or uremia. Despite mildly worsening Cr, I am not convinced that he will require urgent dialysis now. Will consult nephrology (consult placed). Given his advanced dementia and his wife is also very demented and he has no other family support, unless pt will be in nursing home, his outpatient dialysis arrangement will be difficult for him to follow up. Defer to nephrology for future renal arrangement -Hb stable from baseline, continue to monitor -BP and BS controlled -poor prognosis; full code -PT/OT/CM consult for discharge planning, considering SNF or long term care.     DVT prophylaxis:  heparin [place] Code Status:  Full  Family Communication: no family at bedside Disposition Plan:  Consider SNF or long term care; pending PT/OT eval   Consultants:   nephrology  Procedures: NA  Antimicrobials: none      Subjective: No complaints, calm, no hallucination, awake but not orientated at all and no insight of his medical condition at all.   Objective: Vitals:   09/06/17 1359 09/06/17 2103 09/07/17 0526 09/07/17 0654  BP: (!) 142/84 (!) 154/79 (!) 144/68   Pulse: 78 84 72   Resp: 20     Temp: 99.5 F (37.5 C) 99.1 F (37.3 C) 98 F (36.7 C)   TempSrc: Oral Oral Oral   SpO2: 100% 99% 100%   Weight:   70.1 kg   Height:    5\' 5"  (1.651 m)    Intake/Output Summary (Last 24 hours) at 09/07/2017 0824 Last data filed at 09/07/2017 0528 Gross per 24 hour  Intake 949.17 ml  Output 350 ml  Net 599.17 ml   Filed Weights   09/07/17 0526  Weight: 70.1 kg    Examination:  General exam: Appears calm and comfortable  Respiratory system: Clear to auscultation. Respiratory effort normal. Cardiovascular system: S1 & S2 heard, RRR. No JVD, murmurs, rubs, gallops or clicks. No pedal edema. Gastrointestinal system: Abdomen is nondistended, soft and nontender. No organomegaly or masses felt. Normal bowel sounds heard. Central nervous system: awake but confused and not orientated to time/location/people at all. Not following commands. No insight Extremities: no swelling Skin: No rashes, lesions or ulcers Psychiatry: poor  judgement and no insight, advancedly demented    Data Reviewed: I have personally reviewed following labs and imaging studies  CBC: Recent Labs  Lab 09/04/17 1558 09/06/17 1518  WBC 12.5* 12.9*  NEUTROABS 10.3* 10.5*  HGB 10.4* 10.1*  HCT 32.0* 31.1*  MCV 84.9 84.3  PLT 224 211   Basic Metabolic Panel: Recent Labs  Lab 09/04/17 1558 09/06/17 1518 09/07/17 0456  NA 139 139 141  K 3.3* 3.5 3.3*  CL 108 108 110  CO2 19* 21* 19*  GLUCOSE 149*  165* 107*  BUN 46* 49* 48*  CREATININE 6.28* 6.66* 6.55*  CALCIUM 9.3 9.4 9.6   GFR: Estimated Creatinine Clearance: 7.2 mL/min (A) (by C-G formula based on SCr of 6.55 mg/dL (H)). Liver Function Tests: Recent Labs  Lab 09/04/17 1558 09/06/17 1518  AST 10* 13*  ALT 12 13  ALKPHOS 72 75  BILITOT 0.8 0.8  PROT 6.6 6.8  ALBUMIN 3.5 3.2*   No results for input(s): LIPASE, AMYLASE in the last 168 hours. No results for input(s): AMMONIA in the last 168 hours. Coagulation Profile: No results for input(s): INR, PROTIME in the last 168 hours. Cardiac Enzymes: Recent Labs  Lab 09/06/17 1518  TROPONINI <0.03   BNP (last 3 results) No results for input(s): PROBNP in the last 8760 hours. HbA1C: No results for input(s): HGBA1C in the last 72 hours. CBG: Recent Labs  Lab 09/06/17 2154  GLUCAP 136*   Lipid Profile: No results for input(s): CHOL, HDL, LDLCALC, TRIG, CHOLHDL, LDLDIRECT in the last 72 hours. Thyroid Function Tests: No results for input(s): TSH, T4TOTAL, FREET4, T3FREE, THYROIDAB in the last 72 hours. Anemia Panel: No results for input(s): VITAMINB12, FOLATE, FERRITIN, TIBC, IRON, RETICCTPCT in the last 72 hours. Sepsis Labs: Recent Labs  Lab 09/04/17 1708 09/06/17 1518 09/06/17 1654  LATICACIDVEN 1.05 1.4 1.5    Recent Results (from the past 240 hour(s))  Blood culture (routine x 2)     Status: None (Preliminary result)   Collection Time: 09/04/17  3:58 PM  Result Value Ref Range Status   Specimen Description BLOOD LEFT WRIST  Final   Special Requests   Final    BOTTLES DRAWN AEROBIC AND ANAEROBIC Blood Culture adequate volume   Culture   Final    NO GROWTH 3 DAYS Performed at [facility], [address] [place] [address], [place], [place] [address]    Report Status PENDING  Incomplete  Blood culture (routine x 2)     Status: None (Preliminary result)   Collection Time: 09/04/17  5:04 PM  Result Value Ref Range Status   Specimen Description BLOOD RIGHT HAND  Final    Special Requests   Final    BOTTLES DRAWN AEROBIC AND ANAEROBIC Blood Culture adequate volume   Culture   Final    NO GROWTH 3 DAYS Performed at [facility], [address] [place], [place] [address]    Report Status PENDING  Incomplete  Urine culture     Status: None   Collection Time: 09/04/17  5:20 PM  Result Value Ref Range Status   Specimen Description   Final    URINE, CATHETERIZED Performed at [facility], [address] [place], [place] [address]    Special Requests   Final    NONE Performed at [facility], [address] [place], [place] [address]    Culture   Final    NO GROWTH Performed at [facility], [place] [address], [place], [place] [address]  Report Status 09/07/2017 FINAL  Final  MRSA PCR Screening     Status: None   Collection Time: 09/06/17  8:01 PM  Result Value Ref Range Status   MRSA by PCR NEGATIVE NEGATIVE Final    Comment:        The GeneXpert MRSA Assay (FDA approved for NASAL specimens only), is one component of a comprehensive MRSA colonization surveillance program. It is not intended to diagnose MRSA infection nor to guide or monitor treatment for MRSA infections. Performed at [facility], [address], [place], [place] [address]          Radiology Studies: Dg Chest 2 View  Result Date: 09/06/2017 CLINICAL DATA:  Generalized weakness EXAM: CHEST - 2 VIEW COMPARISON:  09/04/2017, 05/27/2011 FINDINGS: No focal airspace disease. Possible tiny left pleural effusion. Cardiomediastinal silhouette within normal limits. No pneumothorax. Clips in the left proximal arm. IMPRESSION: Possible tiny left pleural effusion. Electronically Signed   By: Donavan Foil [name]   On: 09/06/2017 15:57   Ct Head Wo Contrast  Result Date: 09/06/2017 CLINICAL DATA:  Generalized weakness. EXAM: CT HEAD WITHOUT CONTRAST TECHNIQUE: Contiguous axial images were obtained from the base of the skull through the vertex without intravenous contrast. COMPARISON:   April 28, 2017 FINDINGS: Brain: No subdural, epidural, or subarachnoid hemorrhage. Cerebellum, brainstem, and basal cisterns normal. White matter changes noted. No mass effect or midline shift. No acute ischemia or infarct. Ventricular and sulcal prominence is stable, consistent with volume loss. Vascular: Calcified atherosclerosis in the intracranial carotids. Skull: Normal. Negative for fracture or focal lesion. Sinuses/Orbits: No acute finding. Other: None. IMPRESSION: Chronic white matter changes and volume loss. No acute intracranial abnormalities. Electronically Signed   By: Dorise Bullion III M.D   On: 09/06/2017 16:12        Scheduled Meds: . allopurinol  100 mg Oral Daily  . aspirin EC  81 mg Oral Daily  . calcitRIOL  0.25 mcg Oral Daily  . ferrous sulfate  325 mg Oral Q breakfast  . finasteride  5 mg Oral Daily  . heparin  5,000 Units Subcutaneous Q8H  . insulin aspart  0-9 Units Subcutaneous TID WC  . irbesartan  300 mg Oral Daily  . labetalol  200 mg Oral BID   Continuous Infusions:   LOS: 0 days    Time spent: 25 min    Paticia Stack, MD [facility] Pager [phone]  If 7PM-7AM, please contact night-coverage [email or web address] Password [email or web address] 09/07/2017, 8:24 AM

## 2017-09-07 NOTE — NC FL2 (Signed)
[place] LEVEL OF CARE SCREENING TOOL     IDENTIFICATION  Patient Name: Miguel Tucker Birthdate: [birth year] Sex: male Admission Date (Current Location): 09/06/2017  [facility] and [place] Number:  [employer] and Address:  [facility] [address] [place]      Provider Number: [id number]  Attending Physician Name and Address:  Paticia Stack, MD  Relative Name and Phone Number:       Current Level of Care: Other (Comment)(observation) Recommended Level of Care: [place] Prior Approval Number: [id number])  Date Approved/Denied:   PASRR Number:    Discharge Plan: SNF    Current Diagnoses: Patient Active Problem List   Diagnosis Date Noted  . Acute kidney injury superimposed on chronic kidney disease ([place])   . Generalized weakness 09/06/2017  . CKD (chronic kidney disease) stage 5, GFR less than 15 ml/min (HCC) 09/06/2017  . HTN (hypertension) 09/06/2017  . Anemia, chronic disease 09/06/2017  . Acute on chronic renal failure ([place]) 09/06/2017  . Dementia 09/06/2017  . Onychomycosis 04/11/2017  . Hammer toe 04/11/2017  . Diabetes mellitus ([place]) 04/11/2017  . Osteoarthritis of knee 02/11/2017  . Difficulty in walking(719.7) 12/09/2011  . Radicular pain of left lower extremity 12/09/2011  . Wound healing, delayed 11/06/2010    Orientation RESPIRATION BLADDER Height & Weight     Self  Normal Continent Weight: 154 lb 8.7 oz (70.1 kg) Height:  5\' 5"  (165.1 cm)  BEHAVIORAL SYMPTOMS/MOOD NEUROLOGICAL BOWEL NUTRITION STATUS      Continent Diet(heart healthy/carb modified)  AMBULATORY STATUS COMMUNICATION OF NEEDS Skin   Limited Assist Verbally Normal                       Personal Care Assistance Level of Assistance  Bathing, Feeding, Dressing Bathing Assistance: Limited assistance Feeding assistance: Independent Dressing Assistance: Limited assistance     Functional  Limitations Info  Sight, Hearing, Speech Sight Info: Adequate Hearing Info: Adequate Speech Info: Adequate    SPECIAL CARE FACTORS FREQUENCY  PT (By licensed PT)     PT Frequency: 5x/week              Contractures Contractures Info: Not present    Additional Factors Info  Code Status, Allergies Code Status Info: Full Code Allergies Info: Penicillins           Current Medications (09/07/2017):  This is the current hospital active medication list Current Facility-Administered Medications  Medication Dose Route Frequency Provider Last Rate Last Dose  . 0.9 %  sodium chloride infusion   Intravenous Continuous Paticia Stack, MD 75 mL/hr at 09/07/17 1002    . acetaminophen (TYLENOL) tablet 650 mg  650 mg Oral Q6H PRN Johnson-Pitts, Endia, MD   650 mg at 09/07/17 1048   Or  . acetaminophen (TYLENOL) suppository 650 mg  650 mg Rectal Q6H PRN Johnson-Pitts, Endia, MD      . allopurinol (ZYLOPRIM) tablet 100 mg  100 mg Oral Daily Johnson-Pitts, Endia, MD   100 mg at 09/07/17 1002  . aspirin EC tablet 81 mg  81 mg Oral Daily Johnson-Pitts, Endia, MD   81 mg at 09/07/17 1002  . calcitRIOL (ROCALTROL) capsule 0.25 mcg  0.25 mcg Oral Daily Johnson-Pitts, Endia, MD   0.25 mcg at 09/07/17 1002  . ferrous sulfate tablet 325 mg  325 mg Oral Q breakfast Johnson-Pitts, Endia, MD   325 mg at 09/07/17 1002  . finasteride (PROSCAR)  tablet 5 mg  5 mg Oral Daily Johnson-Pitts, Endia, MD   5 mg at 09/07/17 1047  . heparin injection 5,000 Units  5,000 Units Subcutaneous Q8H Johnson-Pitts, Endia, MD   5,000 Units at 09/07/17 0605  . insulin aspart (novoLOG) injection 0-9 Units  0-9 Units Subcutaneous TID WC Johnson-Pitts, Endia, MD      . labetalol (NORMODYNE) tablet 200 mg  200 mg Oral BID Johnson-Pitts, Endia, MD   200 mg at 09/07/17 1002  . ondansetron (ZOFRAN) tablet 4 mg  4 mg Oral Q6H PRN Johnson-Pitts, Endia, MD       Or  . ondansetron (ZOFRAN) injection 4 mg  4 mg Intravenous Q6H PRN  Johnson-Pitts, Endia, MD      . potassium chloride (KLOR-CON) packet 20 mEq  20 mEq Oral Once Bhutani, Manpreet S, MD      . senna-docusate (Senokot-S) tablet 1 tablet  1 tablet Oral QHS PRN Johnson-Pitts, Endia, MD      . traMADol (ULTRAM) tablet 50 mg  50 mg Oral Q12H PRN Johnson-Pitts, Endia, MD         Discharge Medications: Please see discharge summary for a list of discharge medications.  Relevant Imaging Results:  Relevant Lab Results:   Additional Information SSN [id number], Clydene Pugh, LCSW

## 2017-09-08 ENCOUNTER — Inpatient Hospital Stay: Payer: Medicare HMO | Admitting: Physician Assistant

## 2017-09-08 DIAGNOSIS — N185 Chronic kidney disease, stage 5: Secondary | ICD-10-CM | POA: Diagnosis not present

## 2017-09-08 DIAGNOSIS — I10 Essential (primary) hypertension: Secondary | ICD-10-CM | POA: Diagnosis not present

## 2017-09-08 DIAGNOSIS — R69 Illness, unspecified: Secondary | ICD-10-CM | POA: Diagnosis not present

## 2017-09-08 DIAGNOSIS — N179 Acute kidney failure, unspecified: Secondary | ICD-10-CM | POA: Diagnosis not present

## 2017-09-08 DIAGNOSIS — R531 Weakness: Secondary | ICD-10-CM | POA: Diagnosis not present

## 2017-09-08 DIAGNOSIS — E1122 Type 2 diabetes mellitus with diabetic chronic kidney disease: Secondary | ICD-10-CM | POA: Diagnosis not present

## 2017-09-08 DIAGNOSIS — D638 Anemia in other chronic diseases classified elsewhere: Secondary | ICD-10-CM | POA: Diagnosis not present

## 2017-09-08 DIAGNOSIS — N189 Chronic kidney disease, unspecified: Secondary | ICD-10-CM | POA: Diagnosis not present

## 2017-09-08 LAB — URINE CULTURE: Culture: NO GROWTH

## 2017-09-08 LAB — BASIC METABOLIC PANEL
Anion gap: 11 (ref 5–15)
BUN: 47 mg/dL — AB (ref 8–23)
CALCIUM: 9.2 mg/dL (ref 8.9–10.3)
CO2: 18 mmol/L — ABNORMAL LOW (ref 22–32)
Chloride: 113 mmol/L — ABNORMAL HIGH (ref 98–111)
Creatinine, Ser: 6.24 mg/dL — ABNORMAL HIGH (ref 0.61–1.24)
GFR calc Af Amer: 8 mL/min — ABNORMAL LOW (ref >60)
GFR, EST NON AFRICAN AMERICAN: 7 mL/min — AB (ref >60)
Glucose, Bld: 101 mg/dL — ABNORMAL HIGH (ref 70–99)
POTASSIUM: 3.2 mmol/L — AB (ref 3.5–5.1)
SODIUM: 142 mmol/L (ref 135–145)

## 2017-09-08 LAB — GLUCOSE, CAPILLARY
GLUCOSE-CAPILLARY: 104 mg/dL — AB (ref 70–99)
GLUCOSE-CAPILLARY: 86 mg/dL (ref 70–99)
GLUCOSE-CAPILLARY: 101 mg/dL — AB (ref 70–99)
Glucose-Capillary: 98 mg/dL (ref 70–99)
Glucose-Capillary: 85 mg/dL (ref 70–99)
Glucose-Capillary: 87 mg/dL (ref 70–99)

## 2017-09-08 LAB — CBC
HEMATOCRIT: 27 % — AB (ref 39.0–52.0)
Hemoglobin: 8.8 g/dL — ABNORMAL LOW (ref 13.0–17.0)
MCH: 27.5 pg (ref 26.0–34.0)
MCHC: 32.6 g/dL (ref 30.0–36.0)
MCV: 84.4 fL (ref 78.0–100.0)
PLATELETS: 216 10*3/uL (ref 150–400)
RBC: 3.2 MIL/uL — ABNORMAL LOW (ref 4.22–5.81)
RDW: 17 % — AB (ref 11.5–15.5)
WBC: 8.2 10*3/uL (ref 4.0–10.5)

## 2017-09-08 MED ORDER — POTASSIUM CHLORIDE CRYS ER 20 MEQ PO TBCR
40.0000 meq | EXTENDED_RELEASE_TABLET | Freq: Two times a day (BID) | ORAL | Status: DC
  Administered 2017-09-08 – 2017-09-09 (×3): 40 meq via ORAL
  Filled 2017-09-08 (×3): qty 2

## 2017-09-08 MED ORDER — SODIUM CHLORIDE 0.9 % IV SOLN
INTRAVENOUS | Status: AC
  Administered 2017-09-08 – 2017-09-09 (×3): via INTRAVENOUS

## 2017-09-08 NOTE — Progress Notes (Signed)
Physical Therapy Treatment Patient Details Name: Miguel Tucker MRN: [id number] DOB: 12-02-1930 Today's Date: 09/08/2017    History of Present Illness 84 yo male was referred to PT after having generalized weakness and new complaint of L foot pain.  Pt has recent falls and is unsafe at home as he is not consistently using his walker to ambulate.  PMHx: HTN, CKD5, dementia,DM    PT Comments    Pt was seen for evaluation of mobility today, still limited by his pain in his L foot today.  MD stepped in and discussed his symptoms to follow up.  Pt is awaiting a bed in SNF to continue rehab, did well with lateral scoot transfer to use only L heel to wb.  See acutely for strengthening and standing to progress toward gait until he is DC to SNF.  Follow Up Recommendations  SNF     Equipment Recommendations  None recommended by PT    Recommendations for Other Services       Precautions / Restrictions Precautions Precautions: Fall Precaution Comments: pt will not stand on L foot Restrictions Weight Bearing Restrictions: No    Mobility  Bed Mobility Overal bed mobility: Needs Assistance Bed Mobility: Supine to Sit     Supine to sit: Min assist     General bed mobility comments: takes his time to sit up  Transfers Overall transfer level: Needs assistance Equipment used: 1 person hand held assist;Rolling walker (2 wheeled) Transfers: Sit to/from Stand;Lateral/Scoot Transfers Sit to Stand: Max assist;From elevated surface(partial stand)        Lateral/Scoot Transfers: Min guard;Min assist General transfer comment: better control of mobility and could WB through L heel only to use LLE to scoot  Ambulation/Gait             General Gait Details: unable to take a step   Stairs             Wheelchair Mobility    Modified Rankin (Stroke Patients Only)       Balance Overall balance assessment: History of Falls;Needs assistance Sitting-balance support: Feet  supported Sitting balance-Leahy Scale: Good     Standing balance support: Bilateral upper extremity supported;During functional activity Standing balance-Leahy Scale: Poor                              Cognition Arousal/Alertness: Awake/alert Behavior During Therapy: WFL for tasks assessed/performed Overall Cognitive Status: History of cognitive impairments - at baseline                                 General Comments: unable to recall recent history but could describe issue to doctor      Exercises General Exercises - Lower Extremity Ankle Circles/Pumps: AAROM;AROM;Both;5 reps Heel Slides: AROM;AAROM;Both;20 reps Hip ABduction/ADduction: AROM;AAROM;Both;20 reps    General Comments General comments (skin integrity, edema, etc.): pt has reddened skin with warmth over the cuneiform bones on his L foot, painful to DF and with touch tothe area, can WB through heel      Pertinent Vitals/Pain Pain Assessment: Faces Faces Pain Scale: Hurts even more Pain Location: L foot on top of foot Pain Descriptors / Indicators: Sharp(hurts with WB on foot and to DF ankle) Pain Intervention(s): Limited activity within patient's tolerance;Monitored during session;Repositioned;Other (comment)(MD stepped in and talked with her about the issue)    Home Living  Prior Function            PT Goals (current goals can now be found in the care plan section) Acute Rehab PT Goals Patient Stated Goal: to hurt less Progress towards PT goals: Progressing toward goals    Frequency    Min 2X/week      PT Plan Current plan remains appropriate    Co-evaluation              AM-PAC PT "6 Clicks" Daily Activity  Outcome Measure  Difficulty turning over in bed (including adjusting bedclothes, sheets and blankets)?: A Little Difficulty moving from lying on back to sitting on the side of the bed? : A Little Difficulty sitting down on and  standing up from a chair with arms (e.g., wheelchair, bedside commode, etc,.)?: Unable Help needed moving to and from a bed to chair (including a wheelchair)?: A Lot Help needed walking in hospital room?: Total Help needed climbing 3-5 steps with a railing? : Total 6 Click Score: 11    End of Session   Activity Tolerance: Patient limited by pain Patient left: in bed;with call bell/phone within reach;with bed alarm set Nurse Communication: Mobility status PT Visit Diagnosis: Other abnormalities of gait and mobility (R26.89);Muscle weakness (generalized) (M62.81);Pain Pain - Right/Left: Left Pain - part of body: Ankle and joints of foot     Time: 1135-1207 PT Time Calculation (min) (ACUTE ONLY): 32 min  Charges:  $Therapeutic Exercise: 8-22 mins $Therapeutic Activity: 8-22 mins                      Ramond Dial 09/08/2017, 12:48 PM   12:51 PM, 09/08/17 Mee Hives, PT, MS Physical Therapist - [place] [phone] (Office)

## 2017-09-08 NOTE — Care Management (Signed)
Pt from home with wife. Both with memory impairment. needs LTC placement. Needs STR first. CSW working on placement. Awaiting ins auth. CM will follow peripherally in case pt DC's home.

## 2017-09-08 NOTE — Care Management Obs Status (Signed)
[place] NOTIFICATION   Patient Details  Name: Miguel Tucker MRN: [id number] Date of Birth: Sep 16, 1930   Medicare Observation Status Notification Given:  Yes(verbal signature obtained late yesterday afternoon (09/07/2017) via phone)    Rance Smithson, Chauncey Reading, RN 09/08/2017, 7:29 AM

## 2017-09-08 NOTE — Clinical Social Work Note (Signed)
Patient's son, Mr. Vinnie, Gombert., stated that he received the list of facilities emailed on yesterday. He stated that he would email LCSW his preferences for referrals to be sent to within 30 minutes.    Jaydian Santana, Clydene Pugh, LCSW

## 2017-09-08 NOTE — Progress Notes (Signed)
OT Cancellation Note  Patient Details Name: Miguel Tucker MRN: [id number] DOB: 19-Nov-1930   Cancelled Treatment:    Reason Eval/Treat Not Completed: Other (comment). Pt eating breakfast on OT arrival, requiring increased time due to cognition/dementia. Pt requested OT come back later. Will check back at a later time.   [name], OTR/L  [phone] 09/08/2017, 8:53 AM

## 2017-09-08 NOTE — Progress Notes (Signed)
PROGRESS NOTE    Miguel Tucker  BDZ:[id number] DOB: [birth year] DOA: 09/06/2017 PCP: [name], MD   Brief Narrative:  Miguel Tucker is a 84 y.o. male with medical history significant of HTN, CKD 5 and having AV fistula, advanced dementia, p/w generalized weakness. Patient lives at home with his wife who has dementia as well.  They do have a few services including Meals on Wheels and home health.  Since Sunday patient has been a unable to walk on his own.  He denies any focal weakness.  his son Miguel Tucker who lives in the area who be here to help make decisions. Pt is found to have slightly worsening Cr from 5.7 to 6.66 but no evidence of severe acidosis, electrolyte disturbance, intoxication, volume overload or uremia. With IVF, his Cr slightly improved to 6.24, will continue gentle hydration. Nephrology consulted, also felt no indication for dialysis. D/c'ed ARB.   Pt has advanced dementia, not safe to discharge home to live with his demented wife at this point, will plan for STR then transition to LTC. Pending insurance auth and SNF bed offer.    Assessment & Plan:   Principal Problem:   Generalized weakness Active Problems:   Diabetes mellitus (HCC)   CKD (chronic kidney disease) stage 5, GFR less than 15 ml/min (HCC)   HTN (hypertension)   Anemia, chronic disease   Acute on chronic renal failure (HCC)   Dementia   Acute kidney injury superimposed on chronic kidney disease ([place])   Plan: -pt has advanced dementia and no current complaints; no evidence of severe acidosis, electrolyte disturbance, intoxication, volume overload or uremia. Despite mildly worsening Cr, I am not convinced that he will require urgent dialysis now. In fact, his Cr continued to slowly improve with gentle iv hydration. Will continue one more day of IV NS.  -Consulted nephrology, d/c'ed ARB. Given his advanced dementia and his wife is also very demented and he has no other family support, unless pt  will be in nursing home, his outpatient dialysis arrangement will be difficult for him to follow up. Regardless, per nephrology, also felt no indication for dialysis during this stay. Pt will continue to follow up with his primary nephrologist post discharge. Avoid any nephrotoxins such as NSAIDS, fleets enema, contrast, diuretics. -Hb dropped from 10.1 to 8.8, no indication for transfusion yet, continue to monitor; at some point, pt will need procrit shot, defer to nephrology; no evidence of iron deficiency; will check FOBT as well -BP and BS controlled -poor prognosis; full code -PT/OT/CM consult for discharge planning, both patient and his wife are very demented and children not living close by. Pt will need STR now before transitioning into long term care. Pending SNF and insurance authorization.     DVT prophylaxis: heparin [place] Code Status:  Full  Family Communication: no family at bedside Disposition Plan:  Pending SNF and insurance authorization.   Consultants:   nephrology  Procedures: NA  Antimicrobials: none      Subjective: No complaints, calm, no hallucination, awake but not orientated at all and no insight of his medical condition at all. Cr 6.24, slightly improving from 6.55.   Objective: Vitals:   09/07/17 0654 09/07/17 1553 09/07/17 2228 09/08/17 0654  BP:  (!) 149/79 (!) 155/67 139/78  Pulse:  71 72 71  Resp:   16 16  Temp:  98.3 F (36.8 C) 98.7 F (37.1 C) 98.6 F (37 C)  TempSrc:  Oral Oral Oral  SpO2:  100% 100% 100%  Weight:      Height: 5\' 5"  (1.651 m)       Intake/Output Summary (Last 24 hours) at 09/08/2017 0905 Last data filed at 09/08/2017 0700 Gross per 24 hour  Intake 522.5 ml  Output 650 ml  Net -127.5 ml   Filed Weights   09/07/17 0526  Weight: 70.1 kg    Examination:  General exam: Appears calm and comfortable  Respiratory system: Clear to auscultation. Respiratory effort normal. Cardiovascular system: S1 & S2 heard, RRR. No JVD,  murmurs, rubs, gallops or clicks. No pedal edema. Gastrointestinal system: Abdomen is nondistended, soft and nontender. No organomegaly or masses felt. Normal bowel sounds heard. Central nervous system: awake but confused and not orientated to time/location/people at all. Not following commands. No insight Extremities: no swelling Skin: No rashes, lesions or ulcers Psychiatry: poor judgement and no insight, advancedly demented    Data Reviewed: I have personally reviewed following labs and imaging studies  CBC: Recent Labs  Lab 09/04/17 1558 09/06/17 1518 09/08/17 0454  WBC 12.5* 12.9* 8.2  NEUTROABS 10.3* 10.5*  --   HGB 10.4* 10.1* 8.8*  HCT 32.0* 31.1* 27.0*  MCV 84.9 84.3 84.4  PLT 224 214 416   Basic Metabolic Panel: Recent Labs  Lab 09/04/17 1558 09/06/17 1518 09/07/17 0456 09/08/17 0454  NA 139 139 141 142  K 3.3* 3.5 3.3* 3.2*  CL 108 108 110 113*  CO2 19* 21* 19* 18*  GLUCOSE 149* 165* 107* 101*  BUN 46* 49* 48* 47*  CREATININE 6.28* 6.66* 6.55* 6.24*  CALCIUM 9.3 9.4 9.6 9.2   GFR: Estimated Creatinine Clearance: 7.5 mL/min (A) (by C-G formula based on SCr of 6.24 mg/dL (H)). Liver Function Tests: Recent Labs  Lab 09/04/17 1558 09/06/17 1518  AST 10* 13*  ALT 12 13  ALKPHOS 72 75  BILITOT 0.8 0.8  PROT 6.6 6.8  ALBUMIN 3.5 3.2*   No results for input(s): LIPASE, AMYLASE in the last 168 hours. No results for input(s): AMMONIA in the last 168 hours. Coagulation Profile: No results for input(s): INR, PROTIME in the last 168 hours. Cardiac Enzymes: Recent Labs  Lab 09/06/17 1518  TROPONINI <0.03   BNP (last 3 results) No results for input(s): PROBNP in the last 8760 hours. HbA1C: Recent Labs    09/06/17 1518  HGBA1C 5.9*   CBG: Recent Labs  Lab 09/07/17 0839 09/07/17 1219 09/07/17 1616 09/07/17 2233 09/08/17 0738  GLUCAP 103* 104* 104* 98 86   Lipid Profile: No results for input(s): CHOL, HDL, LDLCALC, TRIG, CHOLHDL, LDLDIRECT  in the last 72 hours. Thyroid Function Tests: No results for input(s): TSH, T4TOTAL, FREET4, T3FREE, THYROIDAB in the last 72 hours. Anemia Panel: No results for input(s): VITAMINB12, FOLATE, FERRITIN, TIBC, IRON, RETICCTPCT in the last 72 hours. Sepsis Labs: Recent Labs  Lab 09/04/17 1708 09/06/17 1518 09/06/17 1654  LATICACIDVEN 1.05 1.4 1.5    Recent Results (from the past 240 hour(s))  Blood culture (routine x 2)     Status: None (Preliminary result)   Collection Time: 09/04/17  3:58 PM  Result Value Ref Range Status   Specimen Description BLOOD LEFT WRIST  Final   Special Requests   Final    BOTTLES DRAWN AEROBIC AND ANAEROBIC Blood Culture adequate volume   Culture   Final    NO GROWTH 4 DAYS Performed at [facility], [address], [place], [place] [address]    Report Status PENDING  Incomplete  Blood culture (  routine x 2)     Status: None (Preliminary result)   Collection Time: 09/04/17  5:04 PM  Result Value Ref Range Status   Specimen Description BLOOD RIGHT HAND  Final   Special Requests   Final    BOTTLES DRAWN AEROBIC AND ANAEROBIC Blood Culture adequate volume   Culture   Final    NO GROWTH 4 DAYS Performed at [facility], [address] [place], [place] [address]    Report Status PENDING  Incomplete  Urine culture     Status: None   Collection Time: 09/04/17  5:20 PM  Result Value Ref Range Status   Specimen Description   Final    URINE, CATHETERIZED Performed at [facility], [address] [place], [place] [address]    Special Requests   Final    NONE Performed at [facility], [address], [place], [place] [address]    Culture   Final    NO GROWTH Performed at [facility], [place] [address], [place], [place] [address]    Report Status 09/07/2017 FINAL  Final  Urine culture     Status: None   Collection Time: 09/06/17  4:00 PM  Result Value Ref Range Status   Specimen Description   Final    URINE, RANDOM Performed at [facility], [address], [place], [place] [address]    Special Requests   Final    NONE Performed at [facility], [address], [place], [place] [address]    Culture   Final    NO GROWTH Performed at [facility], [place] [address] [place], [place] [address]    Report Status 09/08/2017 FINAL  Final  MRSA PCR Screening     Status: None   Collection Time: 09/06/17  8:01 PM  Result Value Ref Range Status   MRSA by PCR NEGATIVE NEGATIVE Final    Comment:        The GeneXpert MRSA Assay (FDA approved for NASAL specimens only), is one component of a comprehensive MRSA colonization surveillance program. It is not intended to diagnose MRSA infection nor to guide or monitor treatment for MRSA infections. Performed at [facility], [address], [place], [place] [address]          Radiology Studies: Dg Chest 2 View  Result Date: 09/06/2017 CLINICAL DATA:  Generalized weakness EXAM: CHEST - 2 VIEW COMPARISON:  09/04/2017, 05/27/2011 FINDINGS: No focal airspace disease. Possible tiny left pleural effusion. Cardiomediastinal silhouette within normal limits. No pneumothorax. Clips in the left proximal arm. IMPRESSION: Possible tiny left pleural effusion. Electronically Signed   By: Donavan Foil [name]   On: 09/06/2017 15:57   Ct Head Wo Contrast  Result Date: 09/06/2017 CLINICAL DATA:  Generalized weakness. EXAM: CT HEAD WITHOUT CONTRAST TECHNIQUE: Contiguous axial images were obtained from the base of the skull through the vertex without intravenous contrast. COMPARISON:  April 28, 2017 FINDINGS: Brain: No subdural, epidural, or subarachnoid hemorrhage. Cerebellum, brainstem, and basal cisterns normal. White matter changes noted. No mass effect or midline shift. No acute ischemia or infarct. Ventricular and sulcal prominence is stable, consistent with volume loss. Vascular: Calcified atherosclerosis in the intracranial carotids. Skull: Normal. Negative for fracture or focal lesion.  Sinuses/Orbits: No acute finding. Other: None. IMPRESSION: Chronic white matter changes and volume loss. No acute intracranial abnormalities. Electronically Signed   By: Dorise Bullion III M.D   On: 09/06/2017 16:12        Scheduled Meds: .  allopurinol  100 mg Oral Daily  . aspirin EC  81 mg Oral Daily  . calcitRIOL  0.25 mcg Oral Daily  . ferrous sulfate  325 mg Oral Q breakfast  . finasteride  5 mg Oral Daily  . heparin  5,000 Units Subcutaneous Q8H  . insulin aspart  0-9 Units Subcutaneous TID WC  . labetalol  200 mg Oral BID   Continuous Infusions: . sodium chloride       LOS: 0 days    Time spent: 25 min    Paticia Stack, MD [facility] Pager [phone]  If 7PM-7AM, please contact night-coverage [email or web address] Password [email or web address] 09/08/2017, 9:05 AM

## 2017-09-08 NOTE — Progress Notes (Signed)
Subjective: Interval History: has no complaint of nausea or vomiting.  His appetite is okay but was not great.  Patient denies any difficulty breathing.  At this moment patient does not remember who brought him here and also while..  Objective: Vital signs in last 24 hours: Temp:  [98.3 F (36.8 C)-98.7 F (37.1 C)] 98.6 F (37 C) (09/05 0654) Pulse Rate:  [71-72] 71 (09/05 0654) Resp:  [16] 16 (09/05 0654) BP: (139-155)/(67-79) 139/78 (09/05 0654) SpO2:  [100 %] 100 % (09/05 0654) Weight change:   Intake/Output from previous day: 09/04 0701 - 09/05 0700 In: 522.5 [I.V.:522.5] Out: 650 [Urine:650] Intake/Output this shift: No intake/output data recorded.  General appearance: alert, cooperative and no distress Resp: clear to auscultation bilaterally Cardio: regular rate and rhythm Extremities: No edema  Lab Results: Recent Labs    09/06/17 1518 09/08/17 0454  WBC 12.9* 8.2  HGB 10.1* 8.8*  HCT 31.1* 27.0*  PLT 214 216   BMET:  Recent Labs    09/07/17 0456 09/08/17 0454  NA 141 142  K 3.3* 3.2*  CL 110 113*  CO2 19* 18*  GLUCOSE 107* 101*  BUN 48* 47*  CREATININE 6.55* 6.24*  CALCIUM 9.6 9.2   No results for input(s): PTH in the last 72 hours. Iron Studies: No results for input(s): IRON, TIBC, TRANSFERRIN, FERRITIN in the last 72 hours.  Studies/Results: Dg Chest 2 View  Result Date: 09/06/2017 CLINICAL DATA:  Generalized weakness EXAM: CHEST - 2 VIEW COMPARISON:  09/04/2017, 05/27/2011 FINDINGS: No focal airspace disease. Possible tiny left pleural effusion. Cardiomediastinal silhouette within normal limits. No pneumothorax. Clips in the left proximal arm. IMPRESSION: Possible tiny left pleural effusion. Electronically Signed   By: Donavan Foil [name]   On: 09/06/2017 15:57   Ct Head Wo Contrast  Result Date: 09/06/2017 CLINICAL DATA:  Generalized weakness. EXAM: CT HEAD WITHOUT CONTRAST TECHNIQUE: Contiguous axial images were obtained from the base of the  skull through the vertex without intravenous contrast. COMPARISON:  April 28, 2017 FINDINGS: Brain: No subdural, epidural, or subarachnoid hemorrhage. Cerebellum, brainstem, and basal cisterns normal. White matter changes noted. No mass effect or midline shift. No acute ischemia or infarct. Ventricular and sulcal prominence is stable, consistent with volume loss. Vascular: Calcified atherosclerosis in the intracranial carotids. Skull: Normal. Negative for fracture or focal lesion. Sinuses/Orbits: No acute finding. Other: None. IMPRESSION: Chronic white matter changes and volume loss. No acute intracranial abnormalities. Electronically Signed   By: Dorise Bullion III M.D   On: 09/06/2017 16:12    I have reviewed the patient's current medications.  Assessment/Plan: 1] renal failure: Chronic.  Possibly stage V.  Patient presently does not have any nausea or vomiting.  Patient does not remember who his nephrologist is an outpatient.  At this moment left upper arm fistula was attempted but it does not seem to be functioning.  Patient seems to be followed by [facility]. 2] hypertension: His blood pressure is reasonably controlled 3] anemia: His hemoglobin is below target goal.  At this moment etiology not clear.  Possibly secondary to chronic renal failure. 4] hypokalemia: His potassium is 3.2 5] dementia: Presently patient is alert answers questions.  However at this moment does not seem to remember his nephrologist and also how and why he was brought to the emergency room. 6] bone and mineral disorder: Patient calcium and phosphorus is range.  He is on Rocaltrol 7] fluid management: No sign of fluid overload.  Patient is nonoliguric. 8]  hypokalemia: His potassium remains low. Plan: Patient does not need urgent dialysis 2] he may need possibly to have a tunneled catheter if he requires dialysis. 3] we will check iron studies, CBC and renal panel 4] we will start him on half-normal saline  at 100 cc/h 5] we will start him on KCl 40 mEq p.o. twice daily     LOS: 0 days   Desiree Fleming S 09/08/2017,9:38 AM

## 2017-09-09 DIAGNOSIS — D638 Anemia in other chronic diseases classified elsewhere: Secondary | ICD-10-CM | POA: Diagnosis not present

## 2017-09-09 DIAGNOSIS — I10 Essential (primary) hypertension: Secondary | ICD-10-CM | POA: Diagnosis not present

## 2017-09-09 DIAGNOSIS — N189 Chronic kidney disease, unspecified: Secondary | ICD-10-CM | POA: Diagnosis not present

## 2017-09-09 DIAGNOSIS — R69 Illness, unspecified: Secondary | ICD-10-CM | POA: Diagnosis not present

## 2017-09-09 DIAGNOSIS — N179 Acute kidney failure, unspecified: Secondary | ICD-10-CM | POA: Diagnosis not present

## 2017-09-09 DIAGNOSIS — N185 Chronic kidney disease, stage 5: Secondary | ICD-10-CM | POA: Diagnosis not present

## 2017-09-09 DIAGNOSIS — E1122 Type 2 diabetes mellitus with diabetic chronic kidney disease: Secondary | ICD-10-CM | POA: Diagnosis not present

## 2017-09-09 DIAGNOSIS — R531 Weakness: Secondary | ICD-10-CM | POA: Diagnosis not present

## 2017-09-09 LAB — CULTURE, BLOOD (ROUTINE X 2)
CULTURE: NO GROWTH
Culture: NO GROWTH
SPECIAL REQUESTS: ADEQUATE
Special Requests: ADEQUATE

## 2017-09-09 LAB — CBC
HCT: 26.8 % — ABNORMAL LOW (ref 39.0–52.0)
Hemoglobin: 8.5 g/dL — ABNORMAL LOW (ref 13.0–17.0)
MCH: 26.9 pg (ref 26.0–34.0)
MCHC: 31.7 g/dL (ref 30.0–36.0)
MCV: 84.8 fL (ref 78.0–100.0)
PLATELETS: 209 10*3/uL (ref 150–400)
RBC: 3.16 MIL/uL — AB (ref 4.22–5.81)
RDW: 17.1 % — ABNORMAL HIGH (ref 11.5–15.5)
WBC: 7.2 10*3/uL (ref 4.0–10.5)

## 2017-09-09 LAB — GLUCOSE, CAPILLARY
GLUCOSE-CAPILLARY: 136 mg/dL — AB (ref 70–99)
GLUCOSE-CAPILLARY: 103 mg/dL — AB (ref 70–99)
Glucose-Capillary: 90 mg/dL (ref 70–99)
Glucose-Capillary: 124 mg/dL — ABNORMAL HIGH (ref 70–99)

## 2017-09-09 LAB — RENAL FUNCTION PANEL
ALBUMIN: 2.5 g/dL — AB (ref 3.5–5.0)
Anion gap: 7 (ref 5–15)
BUN: 45 mg/dL — ABNORMAL HIGH (ref 8–23)
CALCIUM: 9.3 mg/dL (ref 8.9–10.3)
CO2: 18 mmol/L — AB (ref 22–32)
CREATININE: 5.87 mg/dL — AB (ref 0.61–1.24)
Chloride: 120 mmol/L — ABNORMAL HIGH (ref 98–111)
GFR calc non Af Amer: 8 mL/min — ABNORMAL LOW (ref >60)
GFR, EST AFRICAN AMERICAN: 9 mL/min — AB (ref >60)
Glucose, Bld: 96 mg/dL (ref 70–99)
Phosphorus: 4.1 mg/dL (ref 2.5–4.6)
Potassium: 4.1 mmol/L (ref 3.5–5.1)
SODIUM: 145 mmol/L (ref 135–145)

## 2017-09-09 LAB — IRON AND TIBC
Iron: 30 ug/dL — ABNORMAL LOW (ref 45–182)
SATURATION RATIOS: 21 % (ref 17.9–39.5)
TIBC: 140 ug/dL — ABNORMAL LOW (ref 250–450)
UIBC: 110 ug/dL

## 2017-09-09 LAB — FERRITIN: Ferritin: 1659 ng/mL — ABNORMAL HIGH (ref 24–336)

## 2017-09-09 LAB — BASIC METABOLIC PANEL
Anion gap: 8 (ref 5–15)
BUN: 46 mg/dL — ABNORMAL HIGH (ref 8–23)
CALCIUM: 9.3 mg/dL (ref 8.9–10.3)
CO2: 17 mmol/L — AB (ref 22–32)
Chloride: 120 mmol/L — ABNORMAL HIGH (ref 98–111)
Creatinine, Ser: 5.84 mg/dL — ABNORMAL HIGH (ref 0.61–1.24)
GFR, EST AFRICAN AMERICAN: 9 mL/min — AB (ref >60)
GFR, EST NON AFRICAN AMERICAN: 8 mL/min — AB (ref >60)
Glucose, Bld: 97 mg/dL (ref 70–99)
Potassium: 4.2 mmol/L (ref 3.5–5.1)
SODIUM: 145 mmol/L (ref 135–145)

## 2017-09-09 MED ORDER — INFLUENZA VAC SPLIT HIGH-DOSE 0.5 ML IM SUSY
0.5000 mL | PREFILLED_SYRINGE | INTRAMUSCULAR | Status: AC
  Administered 2017-09-10: 0.5 mL via INTRAMUSCULAR
  Filled 2017-09-09: qty 0.5

## 2017-09-09 MED ORDER — DARBEPOETIN ALFA 60 MCG/0.3ML IJ SOSY
60.0000 ug | PREFILLED_SYRINGE | INTRAMUSCULAR | Status: DC
  Administered 2017-09-09: 60 ug via SUBCUTANEOUS
  Filled 2017-09-09 (×12): qty 0.3

## 2017-09-09 MED ORDER — SODIUM BICARBONATE 650 MG PO TABS
650.0000 mg | ORAL_TABLET | Freq: Two times a day (BID) | ORAL | Status: DC
  Administered 2017-09-09 – 2017-09-13 (×9): 650 mg via ORAL
  Filled 2017-09-09 (×9): qty 1

## 2017-09-09 NOTE — Progress Notes (Signed)
PROGRESS NOTE    ISON WICHMANN  CNO:[id number] DOB: 23-Mar-1930 DOA: 09/06/2017 PCP: [name], MD    Brief Narrative:  Momodou Consiglio Weatherfordis a 84 y.o.malewith medical history significant ofHTN, CKD 5 and having AV fistula, advanced dementia, p/w generalized weakness. Patient lives at home with his wife who has dementia as well. They do have a few services including Meals on Wheels and home health. Since Sunday patient has been a unable to walk on his own. He denies any focal weakness. his son Jori Moll who lives in the area who be here to help make decisions. Pt is found to have slightly worsening Cr from 5.7 to 6.66 but no evidence of severe acidosis, electrolyte disturbance, intoxication, volume overload or uremia. With IVF, his Cr slightly improved to 6.24, will continue gentle hydration. Nephrology consulted, also felt no indication for dialysis. D/c'ed ARB.   Pt has advanced dementia, not safe to discharge home to live with his demented wife at this point, will plan for STR then transition to LTC. Pending insurance auth and SNF bed offer.    Assessment & Plan:   Principal Problem:   Generalized weakness Active Problems:   Diabetes mellitus (HCC)   CKD (chronic kidney disease) stage 5, GFR less than 15 ml/min (HCC)   HTN (hypertension)   Anemia, chronic disease   Acute on chronic renal failure (HCC)   Dementia   Acute kidney injury superimposed on chronic kidney disease (HCC)   Generalized weakness: From deconditioning and CKD.  PT eval recommending SNF.    Stage 5 CKD: Nephrology on board and appreciate recommendations.    Hypertension:  Well controlled.    Dementia  Stable no behavioral abnormalities.    Anemia of chronic disease:  Transfuse tokeep hemoglobin greater than 7.    Diabetes mellitus:  CBG (last 3)  Recent Labs    09/08/17 2235 09/09/17 0718 09/09/17 1118  GLUCAP 87 90 136*   Well controlled. Resume SSI.       DVT  prophylaxis: sq heparin.  Code Status: full code.  Family Communication:none atbedside.  Disposition Plan: pending insurance auth   Consultants:   Nephrology.   Procedures:none.  Antimicrobials: none  Subjective: In good spirits. Denies any pain.   Objective: Vitals:   09/08/17 1423 09/08/17 2139 09/09/17 0533 09/09/17 1429  BP: (!) 157/94 (!) 160/75 (!) 186/72 (!) 145/66  Pulse: 78 78 75 70  Resp: 16   16  Temp: 98.7 F (37.1 C) 99.2 F (37.3 C) 98.6 F (37 C) 98.6 F (37 C)  TempSrc: Oral Oral Oral Oral  SpO2: 100% 100% 100% 100%  Weight:      Height:        Intake/Output Summary (Last 24 hours) at 09/09/2017 1459 Last data filed at 09/09/2017 1300 Gross per 24 hour  Intake 2396.67 ml  Output 200 ml  Net 2196.67 ml   Filed Weights   09/07/17 0526  Weight: 70.1 kg    Examination:  General exam: Appears calm and comfortable  Respiratory system: Clear to auscultation. Respiratory effort normal. Cardiovascular system: S1 & S2 heard, RRR. No JVD, murmurs, rubs,  Gastrointestinal system: Abdomen is nondistended, soft and nontender. No organomegaly or masses felt. Normal bowel sounds heard. Central nervous system: Alert and comfortable, confused, non focal Extremities: Symmetric 5 x 5 power. Skin: No rashes, lesions or ulcers Psychiatry: calm, not agitated.     Data Reviewed: I have personally reviewed following labs and imaging studies  CBC: Recent Labs  Lab 09/04/17 1558 09/06/17 1518 09/08/17 0454 09/09/17 0517  WBC 12.5* 12.9* 8.2 7.2  NEUTROABS 10.3* 10.5*  --   --   HGB 10.4* 10.1* 8.8* 8.5*  HCT 32.0* 31.1* 27.0* 26.8*  MCV 84.9 84.3 84.4 84.8  PLT 224 214 216 606   Basic Metabolic Panel: Recent Labs  Lab 09/04/17 1558 09/06/17 1518 09/07/17 0456 09/08/17 0454 09/09/17 0517  NA 139 139 141 142 145  145  K 3.3* 3.5 3.3* 3.2* 4.1  4.2  CL 108 108 110 113* 120*  120*  CO2 19* 21* 19* 18* 18*  17*  GLUCOSE 149* 165* 107* 101* 96  97   BUN 46* 49* 48* 47* 45*  46*  CREATININE 6.28* 6.66* 6.55* 6.24* 5.87*  5.84*  CALCIUM 9.3 9.4 9.6 9.2 9.3  9.3  PHOS  --   --   --   --  4.1   GFR: Estimated Creatinine Clearance: 8 mL/min (A) (by C-G formula based on SCr of 5.84 mg/dL (H)). Liver Function Tests: Recent Labs  Lab 09/04/17 1558 09/06/17 1518 09/09/17 0517  AST 10* 13*  --   ALT 12 13  --   ALKPHOS 72 75  --   BILITOT 0.8 0.8  --   PROT 6.6 6.8  --   ALBUMIN 3.5 3.2* 2.5*   No results for input(s): LIPASE, AMYLASE in the last 168 hours. No results for input(s): AMMONIA in the last 168 hours. Coagulation Profile: No results for input(s): INR, PROTIME in the last 168 hours. Cardiac Enzymes: Recent Labs  Lab 09/06/17 1518  TROPONINI <0.03   BNP (last 3 results) No results for input(s): PROBNP in the last 8760 hours. HbA1C: Recent Labs    09/06/17 1518  HGBA1C 5.9*   CBG: Recent Labs  Lab 09/08/17 1128 09/08/17 1610 09/08/17 2235 09/09/17 0718 09/09/17 1118  GLUCAP 101* 85 87 90 136*   Lipid Profile: No results for input(s): CHOL, HDL, LDLCALC, TRIG, CHOLHDL, LDLDIRECT in the last 72 hours. Thyroid Function Tests: No results for input(s): TSH, T4TOTAL, FREET4, T3FREE, THYROIDAB in the last 72 hours. Anemia Panel: Recent Labs    09/09/17 0517  FERRITIN 1,659*  TIBC 140*  IRON 30*   Sepsis Labs: Recent Labs  Lab 09/04/17 1708 09/06/17 1518 09/06/17 1654  LATICACIDVEN 1.05 1.4 1.5    Recent Results (from the past 240 hour(s))  Blood culture (routine x 2)     Status: None   Collection Time: 09/04/17  3:58 PM  Result Value Ref Range Status   Specimen Description BLOOD LEFT WRIST  Final   Special Requests   Final    BOTTLES DRAWN AEROBIC AND ANAEROBIC Blood Culture adequate volume   Culture   Final    NO GROWTH 5 DAYS Performed at [facility], [address] [place], [place] [address]    Report Status 09/09/2017 FINAL  Final  Blood culture (routine x 2)     Status: None    Collection Time: 09/04/17  5:04 PM  Result Value Ref Range Status   Specimen Description BLOOD RIGHT HAND  Final   Special Requests   Final    BOTTLES DRAWN AEROBIC AND ANAEROBIC Blood Culture adequate volume   Culture   Final    NO GROWTH 5 DAYS Performed at [facility], [address], [place], [place] [address]    Report Status 09/09/2017 FINAL  Final  Urine culture     Status: None   Collection Time: 09/04/17  5:20  PM  Result Value Ref Range Status   Specimen Description   Final    URINE, CATHETERIZED Performed at [facility], [address], [place], [place] [address]    Special Requests   Final    NONE Performed at [facility], [address] [place], [place] [address]    Culture   Final    NO GROWTH Performed at [facility], [place] [address], [place], [place] [address]    Report Status 09/07/2017 FINAL  Final  Urine culture     Status: None   Collection Time: 09/06/17  4:00 PM  Result Value Ref Range Status   Specimen Description   Final    URINE, RANDOM Performed at [facility], [address] [place], [place] [address]    Special Requests   Final    NONE Performed at [facility], [address], [place], [place] [address]    Culture   Final    NO GROWTH Performed at [facility], [place] [address] [place], [place] [address]    Report Status 09/08/2017 FINAL  Final  MRSA PCR Screening     Status: None   Collection Time: 09/06/17  8:01 PM  Result Value Ref Range Status   MRSA by PCR NEGATIVE NEGATIVE Final    Comment:        The GeneXpert MRSA Assay (FDA approved for NASAL specimens only), is one component of a comprehensive MRSA colonization surveillance program. It is not intended to diagnose MRSA infection nor to guide or monitor treatment for MRSA infections. Performed at [facility], [address] [place], [place] [address]          Radiology Studies: No results found.      Scheduled Meds: . allopurinol  100 mg Oral  Daily  . aspirin EC  81 mg Oral Daily  . calcitRIOL  0.25 mcg Oral Daily  . Darbepoetin Alfa  60 mcg Subcutaneous Q7 days  . ferrous sulfate  325 mg Oral Q breakfast  . finasteride  5 mg Oral Daily  . heparin  5,000 Units Subcutaneous Q8H  . [START ON 09/10/2017] Influenza vac split quadrivalent PF  0.5 mL Intramuscular Tomorrow-1000  . insulin aspart  0-9 Units Subcutaneous TID WC  . labetalol  200 mg Oral BID  . sodium bicarbonate  650 mg Oral BID   Continuous Infusions:   LOS: 0 days    Time spent: 35 min    Hosie Poisson, MD [facility] Pager [phone]  If 7PM-7AM, please contact night-coverage [email or web address] Password [email or web address] 09/09/2017, 2:59 PM

## 2017-09-09 NOTE — Clinical Social Work Note (Signed)
Patient's insurance [employer] is pending per facility.     Luis Nickles, Clydene Pugh, LCSW

## 2017-09-09 NOTE — Progress Notes (Addendum)
Subjective: Interval History: Patient offers no complaints today.  His appetite is good.  He does not have any nausea or vomiting.  Patient denies any difficulty breathing.  Objective: Vital signs in last 24 hours: Temp:  [98.6 F (37 C)-99.2 F (37.3 C)] 98.6 F (37 C) (09/06 0533) Pulse Rate:  [75-78] 75 (09/06 0533) Resp:  [16] 16 (09/05 1423) BP: (157-186)/(72-94) 186/72 (09/06 0533) SpO2:  [100 %] 100 % (09/06 0533) Weight change:   Intake/Output from previous day: 09/05 0701 - 09/06 0700 In: 2295 [P.O.:720; I.V.:1575] Out: 200 [Urine:200] Intake/Output this shift: No intake/output data recorded.  General appearance: alert, cooperative and no distress Resp: clear to auscultation bilaterally Cardio: regular rate and rhythm Extremities: No edema  Lab Results: Recent Labs    09/08/17 0454 09/09/17 0517  WBC 8.2 7.2  HGB 8.8* 8.5*  HCT 27.0* 26.8*  PLT 216 209   BMET:  Recent Labs    09/08/17 0454 09/09/17 0517  NA 142 145  145  K 3.2* 4.1  4.2  CL 113* 120*  120*  CO2 18* 18*  17*  GLUCOSE 101* 96  97  BUN 47* 45*  46*  CREATININE 6.24* 5.87*  5.84*  CALCIUM 9.2 9.3  9.3   No results for input(s): PTH in the last 72 hours. Iron Studies:  Recent Labs    09/09/17 0517  IRON 30*  TIBC 140*  FERRITIN 1,659*    Studies/Results: No results found.  I have reviewed the patient's current medications.  Assessment/Plan: 1] renal failure: Chronic.  Patient might have an acute component.  His creatinine is better.  Still remained stage V.  Patient presently does not have any nausea or vomiting. 2] hypertension: His blood pressure is reasonably controlled 3] anemia: His hemoglobin is below target goal.  His iron saturation is 21%.  Ferritin is very high.  Hence anemia of chronic disease. 4] hypokalemia: Patient is on potassium supplement.  His potassium is normal. 5] dementia: Patient now is in the hospital. 6] bone and mineral disorder: Patient  calcium and phosphorus is range.  Patient is not on a binder.[name]  He is on Rocaltrol 7] fluid management: No sign of fluid overload.  Patient is nonoliguric. 8] low CO2: Possibly secondary to metabolic acidosis. Plan: 1]Patient does not need urgent dialysis 2] we will start him on iron he is 60 mcg subcu every week 3] we will check iron studies, CBC and renal panel 4] continue with IV hydration 5] DC potassium supplement. 6] we will start patient on sodium bicarbonate 650 mg p.o. twice daily     LOS: 0 days   Jonathan Kirkendoll S 09/09/2017,9:28 AM

## 2017-09-10 DIAGNOSIS — N189 Chronic kidney disease, unspecified: Secondary | ICD-10-CM | POA: Diagnosis not present

## 2017-09-10 DIAGNOSIS — D638 Anemia in other chronic diseases classified elsewhere: Secondary | ICD-10-CM | POA: Diagnosis not present

## 2017-09-10 DIAGNOSIS — I10 Essential (primary) hypertension: Secondary | ICD-10-CM | POA: Diagnosis not present

## 2017-09-10 DIAGNOSIS — N179 Acute kidney failure, unspecified: Secondary | ICD-10-CM | POA: Diagnosis not present

## 2017-09-10 DIAGNOSIS — R69 Illness, unspecified: Secondary | ICD-10-CM | POA: Diagnosis not present

## 2017-09-10 DIAGNOSIS — N185 Chronic kidney disease, stage 5: Secondary | ICD-10-CM | POA: Diagnosis not present

## 2017-09-10 DIAGNOSIS — E1122 Type 2 diabetes mellitus with diabetic chronic kidney disease: Secondary | ICD-10-CM | POA: Diagnosis not present

## 2017-09-10 DIAGNOSIS — R531 Weakness: Secondary | ICD-10-CM | POA: Diagnosis not present

## 2017-09-10 DIAGNOSIS — F0390 Unspecified dementia without behavioral disturbance: Secondary | ICD-10-CM | POA: Diagnosis not present

## 2017-09-10 LAB — CBC
HEMATOCRIT: 26.3 % — AB (ref 39.0–52.0)
HEMOGLOBIN: 8.4 g/dL — AB (ref 13.0–17.0)
MCH: 27.2 pg (ref 26.0–34.0)
MCHC: 31.9 g/dL (ref 30.0–36.0)
MCV: 85.1 fL (ref 78.0–100.0)
Platelets: 202 10*3/uL (ref 150–400)
RBC: 3.09 MIL/uL — ABNORMAL LOW (ref 4.22–5.81)
RDW: 17.4 % — ABNORMAL HIGH (ref 11.5–15.5)
WBC: 6.4 10*3/uL (ref 4.0–10.5)

## 2017-09-10 LAB — RENAL FUNCTION PANEL
ALBUMIN: 2.7 g/dL — AB (ref 3.5–5.0)
ANION GAP: 7 (ref 5–15)
BUN: 40 mg/dL — ABNORMAL HIGH (ref 8–23)
CO2: 18 mmol/L — ABNORMAL LOW (ref 22–32)
Calcium: 9.5 mg/dL (ref 8.9–10.3)
Chloride: 119 mmol/L — ABNORMAL HIGH (ref 98–111)
Creatinine, Ser: 5.38 mg/dL — ABNORMAL HIGH (ref 0.61–1.24)
GFR calc Af Amer: 10 mL/min — ABNORMAL LOW (ref >60)
GFR, EST NON AFRICAN AMERICAN: 9 mL/min — AB (ref >60)
Glucose, Bld: 113 mg/dL — ABNORMAL HIGH (ref 70–99)
PHOSPHORUS: 3.9 mg/dL (ref 2.5–4.6)
POTASSIUM: 4.3 mmol/L (ref 3.5–5.1)
Sodium: 144 mmol/L (ref 135–145)

## 2017-09-10 LAB — GLUCOSE, CAPILLARY
GLUCOSE-CAPILLARY: 117 mg/dL — AB (ref 70–99)
GLUCOSE-CAPILLARY: 122 mg/dL — AB (ref 70–99)
GLUCOSE-CAPILLARY: 93 mg/dL (ref 70–99)
Glucose-Capillary: 94 mg/dL (ref 70–99)

## 2017-09-10 NOTE — Progress Notes (Signed)
PROGRESS NOTE    EFFREY Tucker  XBM:[id number] DOB: 06/10/1930 DOA: 09/06/2017 PCP: [name], MD    Brief Narrative:  Miguel Favor Weatherfordis a 84 y.o.malewith medical history significant ofHTN, CKD 5 and having AV fistula, advanced dementia, p/w generalized weakness. Patient lives at home with his wife who has dementia as well. They do have a few services including Meals on Wheels and home health. Since Sunday patient has been a unable to walk on his own. He denies any focal weakness. his son Miguel Tucker who lives in the area who be here to help make decisions. Pt is found to have slightly worsening Cr from 5.7 to 6.66 but no evidence of severe acidosis, electrolyte disturbance, intoxication, volume overload or uremia. With IVF, his Cr slightly improved to 6.24, will continue gentle hydration. Nephrology consulted, also felt no indication for dialysis. D/c'ed [facility].   Pt has advanced dementia, not safe to discharge home to live with his demented wife at this point, will plan for STR then transition to LTC. Pending insurance auth and SNF bed offer.    Assessment & Plan:   Principal Problem:   Generalized weakness Active Problems:   Diabetes mellitus (HCC)   CKD (chronic kidney disease) stage 5, GFR less than 15 ml/min (HCC)   HTN (hypertension)   Anemia, chronic disease   Acute on chronic renal failure (HCC)   Dementia   Acute kidney injury superimposed on chronic kidney disease (HCC)   Generalized weakness: From deconditioning and CKD.  PT eval recommending SNF.  No changes . He reports he is feeling good without any problems.    Stage 5 CKD: Nephrology on board and appreciate recommendations. Follow creatinine and outpatient follow up with nephrology.    Hypertension:  Well controlled. No change in medications.    Dementia  Stable no behavioral abnormalities.    Anemia of chronic disease:  Transfuse tokeep hemoglobin greater than 7.    Diabetes  mellitus:  CBG (last 3)  Recent Labs    09/09/17 1603 09/09/17 2220 09/10/17 0751  GLUCAP 124* 103* 94   Well controlled. Resume SSI. No changes in meds.       DVT prophylaxis: sq heparin.  Code Status: full code.  Family Communication:none at bedside.  Disposition Plan: pending insurance auth   Consultants:   Nephrology.   Procedures:none.  Antimicrobials: none  Subjective: Alert and comfortable.   Objective: Vitals:   09/09/17 0533 09/09/17 1429 09/09/17 2052 09/10/17 0639  BP: (!) 186/72 (!) 145/66 (!) 188/68 (!) 157/69  Pulse: 75 70 71 66  Resp:  16 15 15   Temp: 98.6 F (37 C) 98.6 F (37 C) 98.9 F (37.2 C) 98.6 F (37 C)  TempSrc: Oral Oral Oral Oral  SpO2: 100% 100% 100% 100%  Weight:    72.1 kg  Height:        Intake/Output Summary (Last 24 hours) at 09/10/2017 1020 Last data filed at 09/10/2017 0300 Gross per 24 hour  Intake 716.67 ml  Output 600 ml  Net 116.67 ml   Filed Weights   09/07/17 0526 09/10/17 0639  Weight: 70.1 kg 72.1 kg    Examination:  General exam: Appears calm and comfortable not in distress,  Respiratory system: Clear to auscultation. Respiratory effort normal. No wheezing or rhonchi.  Cardiovascular system: S1 & S2 heard, RRR. No JVD, murmurs, rubs,  Gastrointestinal system: Abdomen is soft NT ND BS+ Central nervous system: Alert and comfortable, confused, non focal Extremities: Symmetric 5 x  5 power. Skin: No rashes, lesions or ulcers Psychiatry: calm, not agitated.     Data Reviewed: I have personally reviewed following labs and imaging studies  CBC: Recent Labs  Lab 09/04/17 1558 09/06/17 1518 09/08/17 0454 09/09/17 0517 09/10/17 0632  WBC 12.5* 12.9* 8.2 7.2 6.4  NEUTROABS 10.3* 10.5*  --   --   --   HGB 10.4* 10.1* 8.8* 8.5* 8.4*  HCT 32.0* 31.1* 27.0* 26.8* 26.3*  MCV 84.9 84.3 84.4 84.8 85.1  PLT 224 214 216 209 272   Basic Metabolic Panel: Recent Labs  Lab 09/06/17 1518 09/07/17 0456  09/08/17 0454 09/09/17 0517 09/10/17 0632  NA 139 141 142 145  145 144  K 3.5 3.3* 3.2* 4.1  4.2 4.3  CL 108 110 113* 120*  120* 119*  CO2 21* 19* 18* 18*  17* 18*  GLUCOSE 165* 107* 101* 96  97 113*  BUN 49* 48* 47* 45*  46* 40*  CREATININE 6.66* 6.55* 6.24* 5.87*  5.84* 5.38*  CALCIUM 9.4 9.6 9.2 9.3  9.3 9.5  PHOS  --   --   --  4.1 3.9   GFR: Estimated Creatinine Clearance: 8.7 mL/min (A) (by C-G formula based on SCr of 5.38 mg/dL (H)). Liver Function Tests: Recent Labs  Lab 09/04/17 1558 09/06/17 1518 09/09/17 0517 09/10/17 0632  AST 10* 13*  --   --   ALT 12 13  --   --   ALKPHOS 72 75  --   --   BILITOT 0.8 0.8  --   --   PROT 6.6 6.8  --   --   ALBUMIN 3.5 3.2* 2.5* 2.7*   No results for input(s): LIPASE, AMYLASE in the last 168 hours. No results for input(s): AMMONIA in the last 168 hours. Coagulation Profile: No results for input(s): INR, PROTIME in the last 168 hours. Cardiac Enzymes: Recent Labs  Lab 09/06/17 1518  TROPONINI <0.03   BNP (last 3 results) No results for input(s): PROBNP in the last 8760 hours. HbA1C: No results for input(s): HGBA1C in the last 72 hours. CBG: Recent Labs  Lab 09/09/17 0718 09/09/17 1118 09/09/17 1603 09/09/17 2220 09/10/17 0751  GLUCAP 90 136* 124* 103* 94   Lipid Profile: No results for input(s): CHOL, HDL, LDLCALC, TRIG, CHOLHDL, LDLDIRECT in the last 72 hours. Thyroid Function Tests: No results for input(s): TSH, T4TOTAL, FREET4, T3FREE, THYROIDAB in the last 72 hours. Anemia Panel: Recent Labs    09/09/17 0517  FERRITIN 1,659*  TIBC 140*  IRON 30*   Sepsis Labs: Recent Labs  Lab 09/04/17 1708 09/06/17 1518 09/06/17 1654  LATICACIDVEN 1.05 1.4 1.5    Recent Results (from the past 240 hour(s))  Blood culture (routine x 2)     Status: None   Collection Time: 09/04/17  3:58 PM  Result Value Ref Range Status   Specimen Description BLOOD LEFT WRIST  Final   Special Requests   Final     BOTTLES DRAWN AEROBIC AND ANAEROBIC Blood Culture adequate volume   Culture   Final    NO GROWTH 5 DAYS Performed at [facility], [address], [place], [place] [address]    Report Status 09/09/2017 FINAL  Final  Blood culture (routine x 2)     Status: None   Collection Time: 09/04/17  5:04 PM  Result Value Ref Range Status   Specimen Description BLOOD RIGHT HAND  Final   Special Requests   Final    BOTTLES DRAWN  AEROBIC AND ANAEROBIC Blood Culture adequate volume   Culture   Final    NO GROWTH 5 DAYS Performed at [facility], [address] [place], [place] [address]    Report Status 09/09/2017 FINAL  Final  Urine culture     Status: None   Collection Time: 09/04/17  5:20 PM  Result Value Ref Range Status   Specimen Description   Final    URINE, CATHETERIZED Performed at [facility], [address] [place], [place] [address]    Special Requests   Final    NONE Performed at [facility], [address], [place], [place] [address]    Culture   Final    NO GROWTH Performed at [facility], [place] [address], [place], [place] [address]    Report Status 09/07/2017 FINAL  Final  Urine culture     Status: None   Collection Time: 09/06/17  4:00 PM  Result Value Ref Range Status   Specimen Description   Final    URINE, RANDOM Performed at [facility], [address] [place], [place] [address]    Special Requests   Final    NONE Performed at [facility], [address] [place], [place] [address]    Culture   Final    NO GROWTH Performed at [facility], [place] [address], [place], [place] [address]    Report Status 09/08/2017 FINAL  Final  MRSA PCR Screening     Status: None   Collection Time: 09/06/17  8:01 PM  Result Value Ref Range Status   MRSA by PCR NEGATIVE NEGATIVE Final    Comment:        The GeneXpert MRSA Assay (FDA approved for NASAL specimens only), is one component of a comprehensive MRSA colonization surveillance program. It is not intended  to diagnose MRSA infection nor to guide or monitor treatment for MRSA infections. Performed at [facility], [address] [place],  [address]          Radiology Studies: No results found.      Scheduled Meds: . allopurinol  100 mg Oral Daily  . aspirin EC  81 mg Oral Daily  . calcitRIOL  0.25 mcg Oral Daily  . Darbepoetin Alfa  60 mcg Subcutaneous Q7 days  . ferrous sulfate  325 mg Oral Q breakfast  . finasteride  5 mg Oral Daily  . heparin  5,000 Units Subcutaneous Q8H  . insulin aspart  0-9 Units Subcutaneous TID WC  . labetalol  200 mg Oral BID  . sodium bicarbonate  650 mg Oral BID   Continuous Infusions:   LOS: 0 days    Time spent: 35 min    Hosie Poisson, MD [facility] Pager [phone]  If 7PM-7AM, please contact night-coverage [email or web address] Password [email or web address] 09/10/2017, 10:20 AM

## 2017-09-10 NOTE — Progress Notes (Signed)
Patient has no IV access due to pulling it out overnight. Spoke with Dr. Karleen Hampshire and she states it is okay to leave out at this time.

## 2017-09-10 NOTE — Progress Notes (Signed)
Subjective: Interval History: Patient denies any difficulty breathing.  His appetite remains good and no nausea or vomiting.  Objective: Vital signs in last 24 hours: Temp:  [98.6 F (37 C)-98.9 F (37.2 C)] 98.6 F (37 C) (09/07 0639) Pulse Rate:  [66-71] 66 (09/07 0639) Resp:  [15-16] 15 (09/07 0639) BP: (145-188)/(66-69) 157/69 (09/07 0639) SpO2:  [100 %] 100 % (09/07 0639) Weight:  [72.1 kg] 72.1 kg (09/07 0639) Weight change:   Intake/Output from previous day: 09/06 0701 - 09/07 0700 In: 716.7 [P.O.:240; I.V.:476.7] Out: 600 [Urine:600] Intake/Output this shift: No intake/output data recorded.  General appearance: alert, cooperative and no distress Resp: clear to auscultation bilaterally Cardio: regular rate and rhythm Extremities: No edema  Lab Results: Recent Labs    09/09/17 0517 09/10/17 0632  WBC 7.2 6.4  HGB 8.5* 8.4*  HCT 26.8* 26.3*  PLT 209 202   BMET:  Recent Labs    09/09/17 0517 09/10/17 0632  NA 145  145 144  K 4.1  4.2 4.3  CL 120*  120* 119*  CO2 18*  17* 18*  GLUCOSE 96  97 113*  BUN 45*  46* 40*  CREATININE 5.87*  5.84* 5.38*  CALCIUM 9.3  9.3 9.5   No results for input(s): PTH in the last 72 hours. Iron Studies:  Recent Labs    09/09/17 0517  IRON 30*  TIBC 140*  FERRITIN 1,659*    Studies/Results: No results found.  I have reviewed the patient's current medications.  Assessment/Plan: 1] renal failure: Chronic.  His creatinine is progressively improving.  Patient does not have any uremic signs and symptoms.  Remains stage V. 2] hypertension: His blood pressure is reasonably controlled 3] anemia: His hemoglobin is below target goal.  Patient with anemia of chronic disease.  Presently he is started on Arasenp subcutaneously. 4] hypokalemia: His potassium remains normal. 5] dementia: Patient now is in the hospital. 6] bone and mineral disorder: Patient calcium and phosphorus is range.  Patient is not on a binder.[name]   He is on Rocaltrol 7] fluid management: No sign of fluid overload.  Patient is nonoliguric. 8] low CO2: Possibly secondary to metabolic acidosis.  Patient is started on sodium bicarbonate. Plan: 1] we will continue his present management 2] patient states that he is not being followed by nephrology for a long period.  At this moment he does not remember even whether he has a nephrologist as an outpatient of note.  If he is discharged we will follow him if patient come to the office. 3] we will check renal panel in the morning     LOS: 0 days   Miguel Tucker S 09/10/2017,8:06 AM

## 2017-09-11 DIAGNOSIS — N179 Acute kidney failure, unspecified: Secondary | ICD-10-CM | POA: Diagnosis not present

## 2017-09-11 DIAGNOSIS — F0390 Unspecified dementia without behavioral disturbance: Secondary | ICD-10-CM

## 2017-09-11 DIAGNOSIS — D638 Anemia in other chronic diseases classified elsewhere: Secondary | ICD-10-CM

## 2017-09-11 DIAGNOSIS — E1122 Type 2 diabetes mellitus with diabetic chronic kidney disease: Secondary | ICD-10-CM | POA: Diagnosis not present

## 2017-09-11 DIAGNOSIS — N189 Chronic kidney disease, unspecified: Secondary | ICD-10-CM | POA: Diagnosis not present

## 2017-09-11 DIAGNOSIS — I10 Essential (primary) hypertension: Secondary | ICD-10-CM

## 2017-09-11 DIAGNOSIS — R531 Weakness: Secondary | ICD-10-CM

## 2017-09-11 DIAGNOSIS — R69 Illness, unspecified: Secondary | ICD-10-CM | POA: Diagnosis not present

## 2017-09-11 DIAGNOSIS — N185 Chronic kidney disease, stage 5: Secondary | ICD-10-CM | POA: Diagnosis not present

## 2017-09-11 LAB — GLUCOSE, CAPILLARY
GLUCOSE-CAPILLARY: 100 mg/dL — AB (ref 70–99)
GLUCOSE-CAPILLARY: 122 mg/dL — AB (ref 70–99)
Glucose-Capillary: 99 mg/dL (ref 70–99)
Glucose-Capillary: 80 mg/dL (ref 70–99)

## 2017-09-11 LAB — RENAL FUNCTION PANEL
ANION GAP: 8 (ref 5–15)
Albumin: 2.7 g/dL — ABNORMAL LOW (ref 3.5–5.0)
BUN: 40 mg/dL — ABNORMAL HIGH (ref 8–23)
CALCIUM: 9.8 mg/dL (ref 8.9–10.3)
CO2: 21 mmol/L — AB (ref 22–32)
CREATININE: 5.5 mg/dL — AB (ref 0.61–1.24)
Chloride: 113 mmol/L — ABNORMAL HIGH (ref 98–111)
GFR calc Af Amer: 10 mL/min — ABNORMAL LOW (ref >60)
GFR, EST NON AFRICAN AMERICAN: 8 mL/min — AB (ref >60)
Glucose, Bld: 110 mg/dL — ABNORMAL HIGH (ref 70–99)
Phosphorus: 3.8 mg/dL (ref 2.5–4.6)
Potassium: 4.2 mmol/L (ref 3.5–5.1)
SODIUM: 142 mmol/L (ref 135–145)

## 2017-09-11 MED ORDER — AMLODIPINE BESYLATE 5 MG PO TABS
5.0000 mg | ORAL_TABLET | Freq: Every day | ORAL | Status: DC
  Administered 2017-09-11 – 2017-09-12 (×2): 5 mg via ORAL
  Filled 2017-09-11 (×2): qty 1

## 2017-09-11 MED ORDER — HYDRALAZINE HCL 20 MG/ML IJ SOLN: 10.0000 mg | Freq: Four times a day (QID) | INTRAMUSCULAR | Status: DC | PRN

## 2017-09-11 NOTE — Progress Notes (Addendum)
Subjective: Interval History: Patient offers no complaints.  He denies any difficulty breathing.  Patient also denies any pain, no nausea or vomiting.  Objective: Vital signs in last 24 hours: Temp:  [98.4 F (36.9 C)-98.7 F (37.1 C)] 98.4 F (36.9 C) (09/08 0504) Pulse Rate:  [67-71] 67 (09/08 0504) Resp:  [16] 16 (09/08 0504) BP: (164-184)/(70-76) 164/70 (09/08 0504) SpO2:  [100 %] 100 % (09/08 0504) Weight:  [72.1 kg] 72.1 kg (09/08 0635) Weight change: 0 kg  Intake/Output from previous day: 09/07 0701 - 09/08 0700 In: 720 [P.O.:720] Out: 125 [Urine:125] Intake/Output this shift: No intake/output data recorded.  General appearance: alert, cooperative and no distress Resp: clear to auscultation bilaterally Cardio: regular rate and rhythm Extremities: No edema  Lab Results: Recent Labs    09/09/17 0517 09/10/17 0632  WBC 7.2 6.4  HGB 8.5* 8.4*  HCT 26.8* 26.3*  PLT 209 202   BMET:  Recent Labs    09/10/17 0632 09/11/17 0624  NA 144 142  K 4.3 4.2  CL 119* 113*  CO2 18* 21*  GLUCOSE 113* 110*  BUN 40* 40*  CREATININE 5.38* 5.50*  CALCIUM 9.5 9.8   No results for input(s): PTH in the last 72 hours. Iron Studies:  Recent Labs    09/09/17 0517  IRON 30*  TIBC 140*  FERRITIN 1,659*    Studies/Results: No results found.  I have reviewed the patient's current medications.  Assessment/Plan: 1] renal failure: Chronic.  His renal function remains a stable.  At this moment patient denies any uremic signs and symptoms. 2] hypertension: His blood pressure is reasonably controlled 3] anemia: His hemoglobin is below target goal.  Patient on weekly erythropoietin. 4] hypokalemia: His potassium remains normal. 5] dementia: Patient knows he is in a hospital. 6] bone and mineral disorder: Patient calcium and phosphorus is range.   7] fluid management: No sign of fluid overload.  8] low CO2: Possibly secondary to metabolic acidosis.  Patient on sodium  bicarbonate.  His CO2 is 21 has improved. Plan: 1] we will continue his present management 2]  we will check renal panel in the morning     LOS: 0 days   Taleigh Gero S 09/11/2017,8:54 AM

## 2017-09-11 NOTE — Progress Notes (Signed)
Patient wife and son state that the patient had a gold chain when he came to the floor but he does not have it now. It is not found in the room and no family member took it home with them. Patient wife states that the "gold chain cost $1,000 and needs to be returned." Safety Zone entered.

## 2017-09-11 NOTE — Progress Notes (Signed)
PROGRESS NOTE    Miguel Tucker  YIF:[id number] DOB: [birth year] DOA: 09/06/2017 PCP: [name], MD    Brief Narrative:  Miguel Xiang Weatherfordis a 84 y.o.malewith medical history significant ofHTN, CKD 5 and having AV fistula, advanced dementia, p/w generalized weakness. Patient lives at home with his wife who has dementia as well. They do have a few services including Meals on Wheels and home health. Since Sunday patient has been a unable to walk on his own. He denies any focal weakness. his son Miguel Tucker who lives in the area who be here to help make decisions. Pt is found to have slightly worsening Cr from 5.7 to 6.66 but no evidence of severe acidosis, electrolyte disturbance, intoxication, volume overload or uremia. With IVF, his Cr slightly improved to 6.24, will continue gentle hydration. Nephrology consulted, also felt no indication for dialysis. D/c'ed [facility].   Pt has advanced dementia, not safe to discharge home to live with his demented wife at this point, will plan for STR then transition to LTC. Pending insurance auth and SNF bed offer.    Assessment & Plan:   Principal Problem:   Generalized weakness Active Problems:   Diabetes mellitus (HCC)   CKD (chronic kidney disease) stage 5, GFR less than 15 ml/min (HCC)   HTN (hypertension)   Anemia, chronic disease   Acute on chronic renal failure (HCC)   Dementia   Acute kidney injury superimposed on chronic kidney disease (HCC)   Generalized weakness: From deconditioning and CKD.  PT eval recommending SNF.  No changes . He reports he is feeling good without any problems.    AKI on stage 5 CKD: Nephrology on board and appreciate recommendations.  Patient was hydrated with IV fluids and renal function appears to have returned to baseline.  No indications for dialysis at this time..    Hypertension:  Blood pressure running high.  We will add amlodipine and PRN hydralazine..    Dementia  Stable no  behavioral abnormalities.    Anemia of chronic disease:  Transfuse tokeep hemoglobin greater than 7.    Diabetes mellitus:  CBG (last 3)  Recent Labs    09/11/17 0738 09/11/17 1147 09/11/17 1600  GLUCAP 99 100* 122*   Well controlled. Resume SSI. No changes in meds.       DVT prophylaxis: sq heparin.  Code Status: full code.  Family Communication:none at bedside.  Disposition Plan: pending insurance auth, waiting on placement   Consultants:   Nephrology.   Procedures:none.  Antimicrobials: none  Subjective: No chest pain or shortness of breath.   Objective: Vitals:   09/10/17 2104 09/11/17 0504 09/11/17 0635 09/11/17 1434  BP: (!) 184/76 (!) 164/70  (!) 180/86  Pulse: 71 67  66  Resp: 16 16  14   Temp: 98.7 F (37.1 C) 98.4 F (36.9 C)    TempSrc: Oral Oral    SpO2: 100% 100%  100%  Weight:   72.1 kg   Height:        Intake/Output Summary (Last 24 hours) at 09/11/2017 1811 Last data filed at 09/11/2017 1700 Gross per 24 hour  Intake 720 ml  Output 525 ml  Net 195 ml   Filed Weights   09/07/17 0526 09/10/17 0639 09/11/17 0635  Weight: 70.1 kg 72.1 kg 72.1 kg    Examination:  General exam: Alert, awake, no distress Respiratory system: Clear to auscultation. Respiratory effort normal. Cardiovascular system:RRR. No murmurs, rubs, gallops. Gastrointestinal system: Abdomen is nondistended, soft and nontender.  No organomegaly or masses felt. Normal bowel sounds heard. Central nervous system: Alert and oriented. No focal neurological deficits. Extremities: No C/C/E, +pedal pulses Skin: No rashes, lesions or ulcers Psychiatry: Calm, cooperative   Data Reviewed: I have personally reviewed following labs and imaging studies  CBC: Recent Labs  Lab 09/06/17 1518 09/08/17 0454 09/09/17 0517 09/10/17 0632  WBC 12.9* 8.2 7.2 6.4  NEUTROABS 10.5*  --   --   --   HGB 10.1* 8.8* 8.5* 8.4*  HCT 31.1* 27.0* 26.8* 26.3*  MCV 84.3 84.4 84.8 85.1  PLT 214  216 209 878   Basic Metabolic Panel: Recent Labs  Lab 09/07/17 0456 09/08/17 0454 09/09/17 0517 09/10/17 0632 09/11/17 0624  NA 141 142 145  145 144 142  K 3.3* 3.2* 4.1  4.2 4.3 4.2  CL 110 113* 120*  120* 119* 113*  CO2 19* 18* 18*  17* 18* 21*  GLUCOSE 107* 101* 96  97 113* 110*  BUN 48* 47* 45*  46* 40* 40*  CREATININE 6.55* 6.24* 5.87*  5.84* 5.38* 5.50*  CALCIUM 9.6 9.2 9.3  9.3 9.5 9.8  PHOS  --   --  4.1 3.9 3.8   GFR: Estimated Creatinine Clearance: 8.5 mL/min (A) (by C-G formula based on SCr of 5.5 mg/dL (H)). Liver Function Tests: Recent Labs  Lab 09/06/17 1518 09/09/17 0517 09/10/17 6767 09/11/17 0624  AST 13*  --   --   --   ALT 13  --   --   --   ALKPHOS 75  --   --   --   BILITOT 0.8  --   --   --   PROT 6.8  --   --   --   ALBUMIN 3.2* 2.5* 2.7* 2.7*   No results for input(s): LIPASE, AMYLASE in the last 168 hours. No results for input(s): AMMONIA in the last 168 hours. Coagulation Profile: No results for input(s): INR, PROTIME in the last 168 hours. Cardiac Enzymes: Recent Labs  Lab 09/06/17 1518  TROPONINI <0.03   BNP (last 3 results) No results for input(s): PROBNP in the last 8760 hours. HbA1C: No results for input(s): HGBA1C in the last 72 hours. CBG: Recent Labs  Lab 09/10/17 1612 09/10/17 2108 09/11/17 0738 09/11/17 1147 09/11/17 1600  GLUCAP 122* 93 99 100* 122*   Lipid Profile: No results for input(s): CHOL, HDL, LDLCALC, TRIG, CHOLHDL, LDLDIRECT in the last 72 hours. Thyroid Function Tests: No results for input(s): TSH, T4TOTAL, FREET4, T3FREE, THYROIDAB in the last 72 hours. Anemia Panel: Recent Labs    09/09/17 0517  FERRITIN 1,659*  TIBC 140*  IRON 30*   Sepsis Labs: Recent Labs  Lab 09/06/17 1518 09/06/17 1654  LATICACIDVEN 1.4 1.5    Recent Results (from the past 240 hour(s))  Blood culture (routine x 2)     Status: None   Collection Time: 09/04/17  3:58 PM  Result Value Ref Range Status    Specimen Description BLOOD LEFT WRIST  Final   Special Requests   Final    BOTTLES DRAWN AEROBIC AND ANAEROBIC Blood Culture adequate volume   Culture   Final    NO GROWTH 5 DAYS Performed at [facility], [address] [place], [place] [address]    Report Status 09/09/2017 FINAL  Final  Blood culture (routine x 2)     Status: None   Collection Time: 09/04/17  5:04 PM  Result Value Ref Range Status   Specimen Description BLOOD  RIGHT HAND  Final   Special Requests   Final    BOTTLES DRAWN AEROBIC AND ANAEROBIC Blood Culture adequate volume   Culture   Final    NO GROWTH 5 DAYS Performed at [facility], [address] [place], [place] [address]    Report Status 09/09/2017 FINAL  Final  Urine culture     Status: None   Collection Time: 09/04/17  5:20 PM  Result Value Ref Range Status   Specimen Description   Final    URINE, CATHETERIZED Performed at [facility], [address], [place], [place] [address]    Special Requests   Final    NONE Performed at [facility], [address] [place], [place] [address]    Culture   Final    NO GROWTH Performed at [facility], [place] [address], [place], [place] [address]    Report Status 09/07/2017 FINAL  Final  Urine culture     Status: None   Collection Time: 09/06/17  4:00 PM  Result Value Ref Range Status   Specimen Description   Final    URINE, RANDOM Performed at [facility], [address], [place], [place] [address]    Special Requests   Final    NONE Performed at [facility], [address], [place], [place] [address]    Culture   Final    NO GROWTH Performed at [facility], [place] [address], [place], [place] [address]    Report Status 09/08/2017 FINAL  Final  MRSA PCR Screening     Status: None   Collection Time: 09/06/17  8:01 PM  Result Value Ref Range Status   MRSA by PCR NEGATIVE NEGATIVE Final    Comment:        The GeneXpert MRSA Assay (FDA approved for NASAL specimens only), is one component of  a comprehensive MRSA colonization surveillance program. It is not intended to diagnose MRSA infection nor to guide or monitor treatment for MRSA infections. Performed at [facility], [address] [place], [place] [address]          Radiology Studies: No results found.      Scheduled Meds: . allopurinol  100 mg Oral Daily  . amLODipine  5 mg Oral Daily  . aspirin EC  81 mg Oral Daily  . calcitRIOL  0.25 mcg Oral Daily  . Darbepoetin Alfa  60 mcg Subcutaneous Q7 days  . ferrous sulfate  325 mg Oral Q breakfast  . finasteride  5 mg Oral Daily  . heparin  5,000 Units Subcutaneous Q8H  . insulin aspart  0-9 Units Subcutaneous TID WC  . labetalol  200 mg Oral BID  . sodium bicarbonate  650 mg Oral BID   Continuous Infusions:   LOS: 0 days    Time spent: 25 min    Kathie Dike, MD [facility] Pager [phone] If 7PM-7AM, please contact night-coverage [email or web address] Password [email or web address] 09/11/2017, 6:11 PM

## 2017-09-12 DIAGNOSIS — E1122 Type 2 diabetes mellitus with diabetic chronic kidney disease: Secondary | ICD-10-CM

## 2017-09-12 DIAGNOSIS — R69 Illness, unspecified: Secondary | ICD-10-CM | POA: Diagnosis not present

## 2017-09-12 DIAGNOSIS — D638 Anemia in other chronic diseases classified elsewhere: Secondary | ICD-10-CM | POA: Diagnosis not present

## 2017-09-12 DIAGNOSIS — N189 Chronic kidney disease, unspecified: Secondary | ICD-10-CM | POA: Diagnosis not present

## 2017-09-12 DIAGNOSIS — N185 Chronic kidney disease, stage 5: Secondary | ICD-10-CM | POA: Diagnosis not present

## 2017-09-12 DIAGNOSIS — N179 Acute kidney failure, unspecified: Secondary | ICD-10-CM | POA: Diagnosis not present

## 2017-09-12 DIAGNOSIS — I10 Essential (primary) hypertension: Secondary | ICD-10-CM | POA: Diagnosis not present

## 2017-09-12 DIAGNOSIS — F0390 Unspecified dementia without behavioral disturbance: Secondary | ICD-10-CM | POA: Diagnosis not present

## 2017-09-12 DIAGNOSIS — R531 Weakness: Secondary | ICD-10-CM | POA: Diagnosis not present

## 2017-09-12 LAB — RENAL FUNCTION PANEL
ANION GAP: 5 (ref 5–15)
Albumin: 2.9 g/dL — ABNORMAL LOW (ref 3.5–5.0)
BUN: 42 mg/dL — ABNORMAL HIGH (ref 8–23)
CALCIUM: 9.7 mg/dL (ref 8.9–10.3)
CO2: 21 mmol/L — AB (ref 22–32)
CREATININE: 5.46 mg/dL — AB (ref 0.61–1.24)
Chloride: 114 mmol/L — ABNORMAL HIGH (ref 98–111)
GFR, EST AFRICAN AMERICAN: 10 mL/min — AB (ref >60)
GFR, EST NON AFRICAN AMERICAN: 9 mL/min — AB (ref >60)
Glucose, Bld: 98 mg/dL (ref 70–99)
Phosphorus: 3.9 mg/dL (ref 2.5–4.6)
Potassium: 4.3 mmol/L (ref 3.5–5.1)
SODIUM: 140 mmol/L (ref 135–145)

## 2017-09-12 LAB — GLUCOSE, CAPILLARY
GLUCOSE-CAPILLARY: 90 mg/dL (ref 70–99)
Glucose-Capillary: 124 mg/dL — ABNORMAL HIGH (ref 70–99)
Glucose-Capillary: 102 mg/dL — ABNORMAL HIGH (ref 70–99)
Glucose-Capillary: 115 mg/dL — ABNORMAL HIGH (ref 70–99)

## 2017-09-12 MED ORDER — AMLODIPINE BESYLATE 5 MG PO TABS
5.0000 mg | ORAL_TABLET | ORAL | Status: AC
  Administered 2017-09-12: 5 mg via ORAL
  Filled 2017-09-12: qty 1

## 2017-09-12 MED ORDER — AMLODIPINE BESYLATE 5 MG PO TABS
10.0000 mg | ORAL_TABLET | Freq: Every day | ORAL | Status: DC
  Administered 2017-09-13: 10 mg via ORAL
  Filled 2017-09-12: qty 2

## 2017-09-12 MED ORDER — ALPRAZOLAM 0.25 MG PO TABS
0.2500 mg | ORAL_TABLET | Freq: Every evening | ORAL | Status: DC | PRN
  Administered 2017-09-12 (×2): 0.25 mg via ORAL
  Filled 2017-09-12 (×2): qty 1

## 2017-09-12 NOTE — Progress Notes (Signed)
Physical Therapy Treatment Patient Details Name: Miguel Tucker MRN: [id number] DOB: [birth year] Today's Date: 09/12/2017    History of Present Illness 84 yo male was referred to PT after having generalized weakness and new complaint of L foot pain.  Pt has recent falls and is unsafe at home as he is not consistently using his walker to ambulate.  PMHx: HTN, CKD5, dementia,DM    PT Comments    Patient demonstrates much improvement for following directions and cooperative throughout treatment.  Patient has increased endurance/distance for gait training in hallway without loss of balance, slightly labored cadence limited mostly due to c/o fatigue.  Requires repeated verbal cues and demonstration to complete exercises routine with fair/good carryover.  Patient will benefit from continued physical therapy in hospital and recommended venue below to increase strength, balance, endurance for safe ADLs and gait.    Follow Up Recommendations  SNF;Supervision/Assistance - 24 hour     Equipment Recommendations  None recommended by PT    Recommendations for Other Services       Precautions / Restrictions Precautions Precautions: Fall Restrictions Weight Bearing Restrictions: No    Mobility  Bed Mobility Overal bed mobility: Needs Assistance Bed Mobility: Supine to Sit;Sit to Supine     Supine to sit: Min guard Sit to supine: Min guard   General bed mobility comments: slighlty labored movement  Transfers Overall transfer level: Needs assistance Equipment used: Rolling walker (2 wheeled) Transfers: Sit to/from [facility] Sit to Stand: Min assist Stand pivot transfers: Min assist       General transfer comment: much improvement for following directions and tolerating weighbearing on LLE  Ambulation/Gait Ambulation/Gait assistance: Min assist Gait Distance (Feet): 65 Feet Assistive device: Rolling walker (2 wheeled) Gait Pattern/deviations: Decreased step  length - right;Decreased step length - left;Decreased stride length Gait velocity: decreased   General Gait Details: demonstrates increased endurance/distance for gait training with slightly labored slow cadence without loss of balance.   Stairs             Wheelchair Mobility    Modified Rankin (Stroke Patients Only)       Balance Overall balance assessment: Needs assistance Sitting-balance support: Feet supported;No upper extremity supported Sitting balance-Leahy Scale: Good     Standing balance support: Bilateral upper extremity supported;During functional activity Standing balance-Leahy Scale: Fair Standing balance comment: using RW                            Cognition Arousal/Alertness: Awake/alert Behavior During Therapy: WFL for tasks assessed/performed Overall Cognitive Status: History of cognitive impairments - at baseline                                        Exercises General Exercises - Lower Extremity Long Arc Quad: Seated;AROM;Strengthening;Both;10 reps Hip Flexion/Marching: Seated;AROM;Strengthening;Both;10 reps Toe Raises: Seated;AROM;Strengthening;Both;5 reps Heel Raises: Seated;AROM;Strengthening;Both;5 reps    General Comments        Pertinent Vitals/Pain Pain Assessment: No/denies pain    Home Living                      Prior Function            PT Goals (current goals can now be found in the care plan section) Acute Rehab PT Goals Patient Stated Goal: return home PT Goal Formulation: With patient/family Time  For Goal Achievement: 09/21/17 Potential to Achieve Goals: Good Progress towards PT goals: Progressing toward goals    Frequency    Min 3X/week      PT Plan Current plan remains appropriate    Co-evaluation              AM-PAC PT "6 Clicks" Daily Activity  Outcome Measure  Difficulty turning over in bed (including adjusting bedclothes, sheets and blankets)?:  None Difficulty moving from lying on back to sitting on the side of the bed? : A Little Difficulty sitting down on and standing up from a chair with arms (e.g., wheelchair, bedside commode, etc,.)?: A Little Help needed moving to and from a bed to chair (including a wheelchair)?: A Little Help needed walking in hospital room?: A Little Help needed climbing 3-5 steps with a railing? : A Lot 6 Click Score: 18    End of Session Equipment Utilized During Treatment: Gait belt Activity Tolerance: Patient limited by fatigue;Patient tolerated treatment well Patient left: in bed;with call bell/phone within reach;with bed alarm set;with family/visitor present   PT Visit Diagnosis: Other abnormalities of gait and mobility (R26.89);Muscle weakness (generalized) (M62.81);Unsteadiness on feet (R26.81)     Time: 3009-2330 PT Time Calculation (min) (ACUTE ONLY): 27 min  Charges:  $Therapeutic Exercise: 8-22 mins $Therapeutic Activity: 8-22 mins                     3:30 PM, 09/12/17 Lonell Grandchild, MPT Physical Therapist with [facility] [phone] office [phone] mobile phone

## 2017-09-12 NOTE — Clinical Social Work Note (Signed)
LCSW spoke with patient's son, Ron, and reiterated details of custodial care vs short term rehab as previously told to his brother.  LCSW forwarded the email that contained information on contacting DSS in regards to contacting them related to long term Medicaid.    Ron stated that his brother had not applied for long term Medicaid at this point. LCSW stressed the importance of being informed by DSS on what patient/spouse needed to do to qualify. LCSW discussed that the family would be responsible for making a plan for patient long term.  LCSW discussed that even if [employer] authorized patient it would likely be for a short time period and a long term plan needed to be made.   Ron stated that his mother also suffers from dementia and that she has been home during patient's hospitalization, but he was interested in getting both patient and his mother placed.   He stated that he was interested in [facility] as a potential long term option. [facility] did not have any bed availability.    Arion Morgan, Clydene Pugh, LCSW

## 2017-09-12 NOTE — Progress Notes (Signed)
Patient noted to have anxiety this shift. Restless. Redirection/distraction ineffective. Decreased environmental stimuli unsuccessful. On-going, report from previous shift that patient has been restless and up and down during day. Notified MD via text page awaiting response.

## 2017-09-12 NOTE — Progress Notes (Signed)
Subjective: Interval History: Patient feeling okay.  He denies any difficulty breathing.  His appetite is good  Objective: Vital signs in last 24 hours: Temp:  [98.2 F (36.8 C)-98.7 F (37.1 C)] 98.4 F (36.9 C) (09/09 0639) Pulse Rate:  [66-73] 72 (09/09 0639) Resp:  [14-18] 18 (09/09 0639) BP: (147-180)/(68-86) 147/68 (09/09 0639) SpO2:  [98 %-100 %] 98 % (09/09 0639) Weight:  [71.1 kg] 71.1 kg (09/09 0500) Weight change: -1 kg  Intake/Output from previous day: 09/08 0701 - 09/09 0700 In: 720 [P.O.:720] Out: 400 [Urine:400] Intake/Output this shift: No intake/output data recorded.  General appearance: alert, cooperative and no distress Resp: clear to auscultation bilaterally Cardio: regular rate and rhythm Extremities: No edema  Lab Results: Recent Labs    09/10/17 0632  WBC 6.4  HGB 8.4*  HCT 26.3*  PLT 202   BMET:  Recent Labs    09/11/17 0624 09/12/17 0525  NA 142 140  K 4.2 4.3  CL 113* 114*  CO2 21* 21*  GLUCOSE 110* 98  BUN 40* 42*  CREATININE 5.50* 5.46*  CALCIUM 9.8 9.7   No results for input(s): PTH in the last 72 hours. Iron Studies:  No results for input(s): IRON, TIBC, TRANSFERRIN, FERRITIN in the last 72 hours.  Studies/Results: No results found.  I have reviewed the patient's current medications.  Assessment/Plan: 1] renal failure: Chronic.  His renal function remains a stable.  He is a stage V chronic renal failure.  Presently he does not have any nausea or vomiting. 2] hypertension: His blood pressure is reasonably controlled 3] anemia: His hemoglobin is below target goal.  His hemoglobin remains stable.  4] hypokalemia: His potassium remains normal. 5] dementia:  6] bone and mineral disorder: Patient calcium and phosphorus is range.   7] fluid management: No sign of fluid overload.  8] low CO2: Possibly secondary to metabolic acidosis.  Patient on sodium bicarbonate.  His CO2 is 21 has improved. Plan: 1] we will continue his  present management 2]  we will check renal panel in the morning     LOS: 0 days   Daena Alper S 09/12/2017,8:18 AM

## 2017-09-12 NOTE — Progress Notes (Signed)
PROGRESS NOTE    Miguel Tucker  LXB:[id number] DOB: 1930/01/07 DOA: 09/06/2017 PCP: [name], MD    Brief Narrative:  Miguel Bothwell Weatherfordis a 84 y.o.malewith medical history significant ofHTN, CKD 5 and having AV fistula, advanced dementia, p/w generalized weakness. Patient lives at home with his wife who has dementia as well. They do have a few services including Meals on Wheels and home health. Since Sunday patient has been a unable to walk on his own. He denies any focal weakness. his son Miguel Tucker who lives in the area who be here to help make decisions. Pt is found to have slightly worsening Cr from 5.7 to 6.66 but no evidence of severe acidosis, electrolyte disturbance, intoxication, volume overload or uremia. With IVF, his Cr slightly improved to 6.24, will continue gentle hydration. Nephrology consulted, also felt no indication for dialysis. D/c'ed ARB.   Pt has advanced dementia, not safe to discharge home to live with his demented wife at this point, will plan for STR then transition to LTC. Pending insurance auth and SNF bed offer.    Assessment & Plan:   Principal Problem:   Generalized weakness Active Problems:   Diabetes mellitus (HCC)   CKD (chronic kidney disease) stage 5, GFR less than 15 ml/min (HCC)   HTN (hypertension)   Anemia, chronic disease   Acute on chronic renal failure (HCC)   Dementia   Acute kidney injury superimposed on chronic kidney disease (HCC)   Generalized weakness: From deconditioning and CKD.  Physical therapy continues to recommend skilled nursing facility No changes . He reports he is feeling good without any problems.    AKI on stage 5 CKD: Nephrology on board and appreciate recommendations.  Patient was hydrated with IV fluids and renal function appears to have returned to baseline.  No indications for dialysis at this time.  Continue current management.    Hypertension:  Blood pressure running high.  Increase  amlodipine and continue hydralazine as needed   Dementia  Stable no behavioral abnormalities.    Anemia of chronic disease:  Transfuse tokeep hemoglobin greater than 7.    Diabetes mellitus:  CBG (last 3)  Recent Labs    09/12/17 0750 09/12/17 1109 09/12/17 1645  GLUCAP 90 124* 102*   Well controlled. Resume SSI. No changes in meds.       DVT prophylaxis: sq heparin.  Code Status: full code.  Family Communication:none at bedside.  Disposition Plan: pending insurance auth, waiting on placement   Consultants:   Nephrology.   Procedures:none.  Antimicrobials: none  Subjective: Still feels weak, denies any chest pain or shortness of breath.  Objective: Vitals:   09/12/17 0339 09/12/17 0500 09/12/17 0639 09/12/17 1439  BP: (!) 160/86  (!) 147/68 (!) 161/78  Pulse: 73  72 65  Resp: 17  18 18   Temp: 98.2 F (36.8 C)  98.4 F (36.9 C) 98.2 F (36.8 C)  TempSrc: Oral   Oral  SpO2: 98%  98% 100%  Weight:  71.1 kg    Height:        Intake/Output Summary (Last 24 hours) at 09/12/2017 1744 Last data filed at 09/12/2017 1253 Gross per 24 hour  Intake 480 ml  Output -  Net 480 ml   Filed Weights   09/10/17 0639 09/11/17 0635 09/12/17 0500  Weight: 72.1 kg 72.1 kg 71.1 kg    Examination:  General exam: Alert, awake, no distress Respiratory system: Clear to auscultation. Respiratory effort normal. Cardiovascular system:RRR. No  murmurs, rubs, gallops. Gastrointestinal system: Abdomen is nondistended, soft and nontender. No organomegaly or masses felt. Normal bowel sounds heard. Central nervous system: Alert and oriented. No focal neurological deficits. Extremities: No C/C/E, +pedal pulses Skin: No rashes, lesions or ulcers Psychiatry: Pleasant, calm and cooperative   Data Reviewed: I have personally reviewed following labs and imaging studies  CBC: Recent Labs  Lab 09/06/17 1518 09/08/17 0454 09/09/17 0517 09/10/17 0632  WBC 12.9* 8.2 7.2 6.4    NEUTROABS 10.5*  --   --   --   HGB 10.1* 8.8* 8.5* 8.4*  HCT 31.1* 27.0* 26.8* 26.3*  MCV 84.3 84.4 84.8 85.1  PLT 214 216 209 010   Basic Metabolic Panel: Recent Labs  Lab 09/08/17 0454 09/09/17 0517 09/10/17 0632 09/11/17 0624 09/12/17 0525  NA 142 145  145 144 142 140  K 3.2* 4.1  4.2 4.3 4.2 4.3  CL 113* 120*  120* 119* 113* 114*  CO2 18* 18*  17* 18* 21* 21*  GLUCOSE 101* 96  97 113* 110* 98  BUN 47* 45*  46* 40* 40* 42*  CREATININE 6.24* 5.87*  5.84* 5.38* 5.50* 5.46*  CALCIUM 9.2 9.3  9.3 9.5 9.8 9.7  PHOS  --  4.1 3.9 3.8 3.9   GFR: Estimated Creatinine Clearance: 8.6 mL/min (A) (by C-G formula based on SCr of 5.46 mg/dL (H)). Liver Function Tests: Recent Labs  Lab 09/06/17 1518 09/09/17 0517 09/10/17 2725 09/11/17 0624 09/12/17 0525  AST 13*  --   --   --   --   ALT 13  --   --   --   --   ALKPHOS 75  --   --   --   --   BILITOT 0.8  --   --   --   --   PROT 6.8  --   --   --   --   ALBUMIN 3.2* 2.5* 2.7* 2.7* 2.9*   No results for input(s): LIPASE, AMYLASE in the last 168 hours. No results for input(s): AMMONIA in the last 168 hours. Coagulation Profile: No results for input(s): INR, PROTIME in the last 168 hours. Cardiac Enzymes: Recent Labs  Lab 09/06/17 1518  TROPONINI <0.03   BNP (last 3 results) No results for input(s): PROBNP in the last 8760 hours. HbA1C: No results for input(s): HGBA1C in the last 72 hours. CBG: Recent Labs  Lab 09/11/17 1600 09/11/17 2200 09/12/17 0750 09/12/17 1109 09/12/17 1645  GLUCAP 122* 80 90 124* 102*   Lipid Profile: No results for input(s): CHOL, HDL, LDLCALC, TRIG, CHOLHDL, LDLDIRECT in the last 72 hours. Thyroid Function Tests: No results for input(s): TSH, T4TOTAL, FREET4, T3FREE, THYROIDAB in the last 72 hours. Anemia Panel: No results for input(s): VITAMINB12, FOLATE, FERRITIN, TIBC, IRON, RETICCTPCT in the last 72 hours. Sepsis Labs: Recent Labs  Lab 09/06/17 1518 09/06/17 1654   LATICACIDVEN 1.4 1.5    Recent Results (from the past 240 hour(s))  Blood culture (routine x 2)     Status: None   Collection Time: 09/04/17  3:58 PM  Result Value Ref Range Status   Specimen Description BLOOD LEFT WRIST  Final   Special Requests   Final    BOTTLES DRAWN AEROBIC AND ANAEROBIC Blood Culture adequate volume   Culture   Final    NO GROWTH 5 DAYS Performed at [facility], [address], [place], [place] [address]    Report Status 09/09/2017 FINAL  Final  Blood culture (routine  x 2)     Status: None   Collection Time: 09/04/17  5:04 PM  Result Value Ref Range Status   Specimen Description BLOOD RIGHT HAND  Final   Special Requests   Final    BOTTLES DRAWN AEROBIC AND ANAEROBIC Blood Culture adequate volume   Culture   Final    NO GROWTH 5 DAYS Performed at [facility], [address], [place], [place] [address]    Report Status 09/09/2017 FINAL  Final  Urine culture     Status: None   Collection Time: 09/04/17  5:20 PM  Result Value Ref Range Status   Specimen Description   Final    URINE, CATHETERIZED Performed at [facility], [address] [place], [place] [address]    Special Requests   Final    NONE Performed at [facility], [address], [place], [place] [address]    Culture   Final    NO GROWTH Performed at [facility], [place] [address], [place], [place] [address]    Report Status 09/07/2017 FINAL  Final  Urine culture     Status: None   Collection Time: 09/06/17  4:00 PM  Result Value Ref Range Status   Specimen Description   Final    URINE, RANDOM Performed at [facility], [address], [place], [place] [address]    Special Requests   Final    NONE Performed at [facility], [address], [place], [place] [address]    Culture   Final    NO GROWTH Performed at [facility], [place] [address], [place], [place] [address]    Report Status 09/08/2017 FINAL  Final  MRSA PCR Screening     Status: None   Collection Time:  09/06/17  8:01 PM  Result Value Ref Range Status   MRSA by PCR NEGATIVE NEGATIVE Final    Comment:        The GeneXpert MRSA Assay (FDA approved for NASAL specimens only), is one component of a comprehensive MRSA colonization surveillance program. It is not intended to diagnose MRSA infection nor to guide or monitor treatment for MRSA infections. Performed at [facility], [address] [place], [place] [address]          Radiology Studies: No results found.      Scheduled Meds: . allopurinol  100 mg Oral Daily  . [START ON 09/13/2017] amLODipine  10 mg Oral Daily  . amLODipine  5 mg Oral NOW  . aspirin EC  81 mg Oral Daily  . calcitRIOL  0.25 mcg Oral Daily  . Darbepoetin Alfa  60 mcg Subcutaneous Q7 days  . ferrous sulfate  325 mg Oral Q breakfast  . finasteride  5 mg Oral Daily  . heparin  5,000 Units Subcutaneous Q8H  . insulin aspart  0-9 Units Subcutaneous TID WC  . labetalol  200 mg Oral BID  . sodium bicarbonate  650 mg Oral BID   Continuous Infusions:   LOS: 0 days    Time spent: 25 min    Kathie Dike, MD [facility] Pager [phone] If 7PM-7AM, please contact night-coverage [email or web address] Password [facility] 09/12/2017, 5:44 PM

## 2017-09-13 DIAGNOSIS — E1322 Other specified diabetes mellitus with diabetic chronic kidney disease: Secondary | ICD-10-CM | POA: Diagnosis not present

## 2017-09-13 DIAGNOSIS — N189 Chronic kidney disease, unspecified: Secondary | ICD-10-CM | POA: Diagnosis not present

## 2017-09-13 DIAGNOSIS — R29898 Other symptoms and signs involving the musculoskeletal system: Secondary | ICD-10-CM | POA: Diagnosis not present

## 2017-09-13 DIAGNOSIS — N179 Acute kidney failure, unspecified: Secondary | ICD-10-CM | POA: Diagnosis not present

## 2017-09-13 DIAGNOSIS — R1312 Dysphagia, oropharyngeal phase: Secondary | ICD-10-CM | POA: Diagnosis not present

## 2017-09-13 DIAGNOSIS — I10 Essential (primary) hypertension: Secondary | ICD-10-CM | POA: Diagnosis not present

## 2017-09-13 DIAGNOSIS — N185 Chronic kidney disease, stage 5: Secondary | ICD-10-CM | POA: Diagnosis not present

## 2017-09-13 DIAGNOSIS — R531 Weakness: Secondary | ICD-10-CM | POA: Diagnosis not present

## 2017-09-13 DIAGNOSIS — M6281 Muscle weakness (generalized): Secondary | ICD-10-CM | POA: Diagnosis not present

## 2017-09-13 DIAGNOSIS — R69 Illness, unspecified: Secondary | ICD-10-CM | POA: Diagnosis not present

## 2017-09-13 DIAGNOSIS — E1122 Type 2 diabetes mellitus with diabetic chronic kidney disease: Secondary | ICD-10-CM | POA: Diagnosis not present

## 2017-09-13 DIAGNOSIS — D631 Anemia in chronic kidney disease: Secondary | ICD-10-CM | POA: Diagnosis not present

## 2017-09-13 DIAGNOSIS — Z7401 Bed confinement status: Secondary | ICD-10-CM | POA: Diagnosis not present

## 2017-09-13 DIAGNOSIS — G8929 Other chronic pain: Secondary | ICD-10-CM | POA: Diagnosis not present

## 2017-09-13 DIAGNOSIS — D638 Anemia in other chronic diseases classified elsewhere: Secondary | ICD-10-CM | POA: Diagnosis not present

## 2017-09-13 LAB — CBC
HCT: 29.5 % — ABNORMAL LOW (ref 39.0–52.0)
HEMOGLOBIN: 9.4 g/dL — AB (ref 13.0–17.0)
MCH: 27.3 pg (ref 26.0–34.0)
MCHC: 31.9 g/dL (ref 30.0–36.0)
MCV: 85.8 fL (ref 78.0–100.0)
Platelets: 211 10*3/uL (ref 150–400)
RBC: 3.44 MIL/uL — AB (ref 4.22–5.81)
RDW: 16.9 % — ABNORMAL HIGH (ref 11.5–15.5)
WBC: 5.7 10*3/uL (ref 4.0–10.5)

## 2017-09-13 LAB — GLUCOSE, CAPILLARY
Glucose-Capillary: 91 mg/dL (ref 70–99)
Glucose-Capillary: 124 mg/dL — ABNORMAL HIGH (ref 70–99)

## 2017-09-13 LAB — RENAL FUNCTION PANEL
ALBUMIN: 2.9 g/dL — AB (ref 3.5–5.0)
ANION GAP: 8 (ref 5–15)
BUN: 40 mg/dL — ABNORMAL HIGH (ref 8–23)
CALCIUM: 9.8 mg/dL (ref 8.9–10.3)
CO2: 21 mmol/L — ABNORMAL LOW (ref 22–32)
CREATININE: 5.74 mg/dL — AB (ref 0.61–1.24)
Chloride: 112 mmol/L — ABNORMAL HIGH (ref 98–111)
GFR calc non Af Amer: 8 mL/min — ABNORMAL LOW (ref >60)
GFR, EST AFRICAN AMERICAN: 9 mL/min — AB (ref >60)
Glucose, Bld: 105 mg/dL — ABNORMAL HIGH (ref 70–99)
PHOSPHORUS: 4 mg/dL (ref 2.5–4.6)
Potassium: 4.3 mmol/L (ref 3.5–5.1)
SODIUM: 141 mmol/L (ref 135–145)

## 2017-09-13 MED ORDER — AMLODIPINE BESYLATE 10 MG PO TABS: 10.0000 mg | ORAL_TABLET | Freq: Every day | ORAL | Status: DC

## 2017-09-13 MED ORDER — SODIUM BICARBONATE 650 MG PO TABS: 650.0000 mg | ORAL_TABLET | Freq: Two times a day (BID) | ORAL | Status: DC

## 2017-09-13 MED ORDER — TRAMADOL HCL 50 MG PO TABS: 50.0000 mg | ORAL_TABLET | Freq: Two times a day (BID) | ORAL | 0 refills | Status: DC | PRN

## 2017-09-13 NOTE — Clinical Social Work Placement (Signed)
   CLINICAL SOCIAL WORK PLACEMENT  NOTE  Date:  09/13/2017  Patient Details  Name: Miguel Tucker MRN: [id number] Date of Birth: 1930-05-26  Clinical Social Work is seeking post-discharge placement for this patient at the [place] level of care (*CSW will initial, date and re-position this form in  chart as items are completed):  Yes   Patient/family provided with [facility] Work Department's list of facilities offering this level of care within the geographic area requested by the patient (or if unable, by the patient's family).  Yes   Patient/family informed of their freedom to choose among providers that offer the needed level of care, that participate in Medicare, Medicaid or managed care program needed by the patient, have an available bed and are willing to accept the patient.  Yes   Patient/family informed of [facility]'s ownership interest in [facility] and [facility], as well as of the fact that they are under no obligation to receive care at these facilities.  PASRR submitted to EDS on 09/07/17     PASRR number received on 09/07/17     Existing PASRR number confirmed on       FL2 transmitted to all facilities in geographic area requested by pt/family on 09/08/17     FL2 transmitted to all facilities within larger geographic area on       Patient informed that his/her managed care company has contracts with or will negotiate with certain facilities, including the following:        Yes   Patient/family informed of bed offers received.  Patient chooses bed at [facility]     Physician recommends and patient chooses bed at      Patient to be transferred to [facility] on 09/13/17.  Patient to be transferred to facility by [facility]     Patient family notified on 09/13/17 of transfer.  Name of family member notified:  Danton Sewer     PHYSICIAN       Additional Comment:  Son advised that patient was  only authorized for 6 days. Son advised that APS report had been made to assist with a safe more permanent plan.  Discharge clinicals sent to facility.  LCSW signing off.   _______________________________________________ Ihor Gully, LCSW 09/13/2017, 3:15 PM

## 2017-09-13 NOTE — Evaluation (Signed)
Occupational Therapy Evaluation Patient Details Name: Miguel Tucker MRN: [id number] DOB: June 11, 1930 Today's Date: 09/13/2017    History of Present Illness 84 yo male was referred to PT after having generalized weakness and new complaint of L foot pain.  Pt has recent falls and is unsafe at home as he is not consistently using his walker to ambulate.  PMHx: HTN, CKD5, dementia,DM   Clinical Impression   Pt received supine in bed, agreeable to OT evaluation. Pt primarily limited due to cognition/dementia. Pt able to follow directions, occasionally repeating tasks already completed. Pt requiring supervision for bed mobility, transfers, and seated ADLs. Min guard for standing ADLs and functional mobility tasks. Recommend SNF on discharge with potential transition to LTC due to dementia. No further acute OT services required at this time, all further needs can be met at [facility]     Follow Up Recommendations  SNF;Supervision/Assistance - 24 hour    Equipment Recommendations  None recommended by OT       Precautions / Restrictions Precautions Precautions: Fall Restrictions Weight Bearing Restrictions: No      Mobility Bed Mobility Overal bed mobility: Needs Assistance Bed Mobility: Supine to Sit;Sit to Supine     Supine to sit: Supervision Sit to supine: Supervision      Transfers Overall transfer level: Needs assistance Equipment used: Rolling walker (2 wheeled) Transfers: Sit to/from Stand Sit to Stand: Min guard                  ADL either performed or assessed with clinical judgement   ADL Overall ADL's : Needs assistance/impaired     Grooming: Wash/dry hands;Wash/dry face;Brushing hair;Min guard;Standing               Lower Body Dressing: Supervision/safety;Sitting/lateral leans               Functional mobility during ADLs: Min guard;Rolling walker       Vision Baseline Vision/History: Wears glasses Wears Glasses: Reading only Patient  Visual Report: No change from baseline Vision Assessment?: No apparent visual deficits            Pertinent Vitals/Pain Pain Assessment: No/denies pain     Hand Dominance Right   Extremity/Trunk Assessment Upper Extremity Assessment Upper Extremity Assessment: Overall WFL for tasks assessed(grossly 4/5 in BUE)   Lower Extremity Assessment Lower Extremity Assessment: Defer to PT evaluation   Cervical / Trunk Assessment Cervical / Trunk Assessment: Normal   Communication Communication Communication: No difficulties   Cognition Arousal/Alertness: Awake/alert Behavior During Therapy: WFL for tasks assessed/performed Overall Cognitive Status: History of cognitive impairments - at baseline                                                Home Living Family/patient expects to be discharged to:: Skilled nursing facility Living Arrangements: Spouse/significant other;Children Available Help at Discharge: Family;Available 24 hours/day Type of Home: House       Home Layout: One level     Bathroom Shower/Tub: Teacher, early years/pre: Standard     Home Equipment: Environmental consultant - 2 wheels   Additional Comments: pt is very poor historian and cannot give full details of home      Prior Functioning/Environment Level of Independence: Needs assistance  Gait / Transfers Assistance Needed: was using RW inconsistently  ADL's / Homemaking Assistance Needed: pt unable  to give details            OT Problem List: Decreased safety awareness;Decreased cognition;Decreased activity tolerance       AM-PAC PT "6 Clicks" Daily Activity     Outcome Measure Help from another person eating meals?: A Little Help from another person taking care of personal grooming?: A Little Help from another person toileting, which includes using toliet, bedpan, or urinal?: A Little Help from another person bathing (including washing, rinsing, drying)?: A Little Help from another  person to put on and taking off regular upper body clothing?: A Little Help from another person to put on and taking off regular lower body clothing?: A Little 6 Click Score: 18   End of Session Equipment Utilized During Treatment: Gait belt;Rolling walker  Activity Tolerance: Patient tolerated treatment well Patient left: in bed;with call bell/phone within reach;with bed alarm set  OT Visit Diagnosis: Muscle weakness (generalized) (M62.81);Other symptoms and signs involving cognitive function                Time: 0840-0900 OT Time Calculation (min): 20 min Charges:  OT General Charges $OT Visit: 1 Visit OT Evaluation $OT Eval Low Complexity: Cassia, OTR/L  [phone] 09/13/2017, 9:04 AM

## 2017-09-13 NOTE — Clinical Social Work Note (Signed)
APS report made to [facility] APS More[employer] William Hamburger). Mrs. William Hamburger found that patient's address was [facility] and indicated that she would type the report and send it to [facility].    Cayleb Jarnigan, Clydene Pugh, LCSW

## 2017-09-13 NOTE — Progress Notes (Signed)
Subjective: Interval History: Patient offers no complaints.  Denies any difficulty breathing.  Objective: Vital signs in last 24 hours: Temp:  [98 F (36.7 C)-98.5 F (36.9 C)] 98 F (36.7 C) (09/10 0552) Pulse Rate:  [65-71] 69 (09/10 0552) Resp:  [18] 18 (09/09 1439) BP: (123-161)/(76-91) 123/76 (09/10 0552) SpO2:  [99 %-100 %] 99 % (09/10 0552) Weight:  [71.3 kg] 71.3 kg (09/10 0552) Weight change: 0.2 kg  Intake/Output from previous day: 09/09 0701 - 09/10 0700 In: 720 [P.O.:720] Out: 100 [Urine:100] Intake/Output this shift: No intake/output data recorded.  General appearance: alert, cooperative and no distress Resp: clear to auscultation bilaterally Cardio: regular rate and rhythm Extremities: No edema  Lab Results: Recent Labs    09/13/17 0534  WBC 5.7  HGB 9.4*  HCT 29.5*  PLT 211   BMET:  Recent Labs    09/12/17 0525 09/13/17 0534  NA 140 141  K 4.3 4.3  CL 114* 112*  CO2 21* 21*  GLUCOSE 98 105*  BUN 42* 40*  CREATININE 5.46* 5.74*  CALCIUM 9.7 9.8   No results for input(s): PTH in the last 72 hours. Iron Studies:  No results for input(s): IRON, TIBC, TRANSFERRIN, FERRITIN in the last 72 hours.  Studies/Results: No results found.  I have reviewed the patient's current medications.  Assessment/Plan: 1] renal failure: Chronic.  His renal function remains a stable.  He is a stage V chronic renal failure.  His renal function is worsening.  Patient is not getting an IV fluid.  Presently he is a symptomatic.  Since patient has severe dementia not sure whether he is answering appropriately or not. 2] hypertension: His blood pressure is reasonably controlled 3] anemia: His hemoglobin is below target goal.  His hemoglobin remains stable.  4] hypokalemia: His potassium remains normal. 5] dementia:  6] bone and mineral disorder: Patient calcium and phosphorus is range.   7] fluid management: Patient denies any difficulty breathing. 8] low CO2:  Patient is on sodium bicarbonate and his CO2 is better. Plan: 1] we will continue his present management 2]  we will check renal panel in the morning     LOS: 0 days   Malayiah Mcbrayer S 09/13/2017,8:48 AM

## 2017-09-13 NOTE — Discharge Summary (Signed)
Physician Discharge Summary  Miguel Tucker KXF:[id number] DOB: 01/25/1930 DOA: 09/06/2017  PCP: [name], MD  Admit date: 09/06/2017 Discharge date: 09/13/2017  Admitted From: Home Disposition: Skilled nursing facility  Recommendations for Outpatient Follow-up:  1. Follow up with PCP in 1-2 weeks 2. Please obtain BMP/CBC in one week 3. Follow-up with nephrology in 2 weeks  Discharge Condition: Stable CODE STATUS: Full code Diet recommendation: Heart healthy, carb modified  Brief/Interim Summary: 84 year old male with history of dementia, hypertension, chronic kidney disease stage V, who lives at home with his wife, was brought to the hospital with generalized weakness with an inability to ambulate.  On arrival to the emergency room, he was noted to have acute kidney injury and chronic kidney disease, with dehydrated and was admitted for further treatments.  Discharge Diagnoses:  Principal Problem:   Generalized weakness Active Problems:   Diabetes mellitus (HCC)   CKD (chronic kidney disease) stage 5, GFR less than 15 ml/min (HCC)   HTN (hypertension)   Anemia, chronic disease   Acute on chronic renal failure (HCC)   Dementia   Acute kidney injury superimposed on chronic kidney disease ([place])  1. Generalized weakness.  Secondary to deconditioning.  Physical therapy evaluated the patient recommended skilled nursing facility placement. 2. Acute kidney injury on chronic kidney disease stage V.  Felt to be related to dehydration in the setting of Lasix as well as ARB.  Both Lasix and ARB have been held.  He was hydrated with IV fluids and creatinine appears to have returned to baseline.  No indications for dialysis at this time.  He will follow-up with nephrology in 2 weeks. 3. Hypertension.  Blood pressure is better controlled after the addition of amlodipine in addition to labetalol.  Continue managed as an outpatient 4. Dementia.  Stable 5. Anemia of chronic kidney  disease.  Hemoglobin has been stable.  Discharge Instructions  Discharge Instructions    Diet - low sodium heart healthy   Complete by:  As directed    Increase activity slowly   Complete by:  As directed      Allergies as of 09/13/2017      Reactions   Penicillins Itching, Other (See Comments)   On arms only. Has patient had a PCN reaction causing immediate rash, facial/tongue/throat swelling, SOB or lightheadedness with hypotension: No Has patient had a PCN reaction causing severe rash involving mucus membranes or skin necrosis: No Has patient had a PCN reaction that required hospitalization No Has patient had a PCN reaction occurring within the last 10 years: No If all of the above answers are "NO", then may proceed with Cephalosporin use.      Medication List    STOP taking these medications   furosemide 40 MG tablet Commonly known as:  LASIX   valsartan 160 MG tablet Commonly known as:  DIOVAN     TAKE these medications   Adjustable Aluminum Cane Misc Use the cane as needed for weight bearing   allopurinol 100 MG tablet Commonly known as:  ZYLOPRIM Take 100 mg by mouth daily.   amLODipine 10 MG tablet Commonly known as:  NORVASC Take 1 tablet (10 mg total) by mouth daily. Start taking on:  09/14/2017   aspirin EC 81 MG tablet Take 81 mg by mouth every morning.   calcitRIOL 0.25 MCG capsule Commonly known as:  ROCALTROL Take 0.25 mcg by mouth daily.   ferrous sulfate 325 (65 FE) MG tablet Take 325 mg by mouth daily  with breakfast.   finasteride 5 MG tablet Commonly known as:  PROSCAR Take 5 mg by mouth daily.   labetalol 200 MG tablet Commonly known as:  NORMODYNE Take 200 mg by mouth 2 (two) times daily.   sodium bicarbonate 650 MG tablet Take 1 tablet (650 mg total) by mouth 2 (two) times daily.   traMADol 50 MG tablet Commonly known as:  ULTRAM Take 1 tablet (50 mg total) by mouth every 12 (twelve) hours as needed. What changed:  when to take  this       Contact information for follow-up providers    Yancey, Modena Nunnery, MD. Schedule an appointment as soon as possible for a visit in 1 week(s).   Specialty:  Family Medicine Contact information: [address] [place] [address] [phone]        Fran Lowes, MD Follow up in 3 week(s).   Specialty:  Nephrology Contact information: [address] [address] [phone]            Contact information for after-discharge care    [facility] .   Service:  Skilled Nursing Contact information: [address] [address] [phone]                 Allergies  Allergen Reactions  . Penicillins Itching and Other (See Comments)    On arms only. Has patient had a PCN reaction causing immediate rash, facial/tongue/throat swelling, SOB or lightheadedness with hypotension: No Has patient had a PCN reaction causing severe rash involving mucus membranes or skin necrosis: No Has patient had a PCN reaction that required hospitalization No Has patient had a PCN reaction occurring within the last 10 years: No If all of the above answers are "NO", then may proceed with Cephalosporin use.     Consultations:  Nephrology   Procedures/Studies: Dg Chest 2 View  Result Date: 09/06/2017 CLINICAL DATA:  Generalized weakness EXAM: CHEST - 2 VIEW COMPARISON:  09/04/2017, 05/27/2011 FINDINGS: No focal airspace disease. Possible tiny left pleural effusion. Cardiomediastinal silhouette within normal limits. No pneumothorax. Clips in the left proximal arm. IMPRESSION: Possible tiny left pleural effusion. Electronically Signed   By: Donavan Foil [name]   On: 09/06/2017 15:57   Ct Head Wo Contrast  Result Date: 09/06/2017 CLINICAL DATA:  Generalized weakness. EXAM: CT HEAD WITHOUT CONTRAST TECHNIQUE: Contiguous axial images were obtained from the base of the skull through the vertex  without intravenous contrast. COMPARISON:  April 28, 2017 FINDINGS: Brain: No subdural, epidural, or subarachnoid hemorrhage. Cerebellum, brainstem, and basal cisterns normal. White matter changes noted. No mass effect or midline shift. No acute ischemia or infarct. Ventricular and sulcal prominence is stable, consistent with volume loss. Vascular: Calcified atherosclerosis in the intracranial carotids. Skull: Normal. Negative for fracture or focal lesion. Sinuses/Orbits: No acute finding. Other: None. IMPRESSION: Chronic white matter changes and volume loss. No acute intracranial abnormalities. Electronically Signed   By: Dorise Bullion III M.D   On: 09/06/2017 16:12   Dg Chest Port 1 View  Result Date: 09/04/2017 CLINICAL DATA:  WEAKNESS, PER ER NOTE, Per EMS pt wife called 911 because pt has been sitting in his chair all day and is weak. Per ems pt has dementia. RN asked pt if he is hurting or knows why he is here and patient states he doesn't. Denies a fall. History of asthma, diabetes, hypertension. EXAM: PORTABLE CHEST 1 VIEW COMPARISON:  05/27/2011 FINDINGS: The heart is accentuated by technique. Aorta is tortuous. There are no focal consolidations or pleural effusions. No pulmonary edema. IMPRESSION: No active disease. Electronically Signed   By: Nolon Nations [name]   On: 09/04/2017 16:03   Dg Knee Complete 4 Views Right  Result Date: 08/19/2017 CLINICAL DATA:  Right knee pain for 3 months without known injury. EXAM: RIGHT KNEE - COMPLETE 4+ VIEW COMPARISON:  Radiographs of June 22, 2017. FINDINGS: No fracture or dislocation is noted. Mild suprapatellar joint effusion is noted. Mild patellar spurring is noted. Severe narrowing of medial joint space is noted. Loose body is noted posteriorly. Moderate degenerative joint disease is noted laterally with chondrocalcinosis. IMPRESSION: Mild suprapatellar joint effusion. Severe degenerative joint disease is noted medially. No fracture or dislocation is  noted. Electronically Signed   By: Marijo Conception, [name]   On: 08/19/2017 15:14       Subjective: No shortness of breath or chest pain  Discharge Exam: Vitals:   09/12/17 2136 09/13/17 0552 09/13/17 1024 09/13/17 1348  BP: (!) 143/91 123/76  (!) 142/66  Pulse: 71 69  65  Resp:    16  Temp: 98.5 F (36.9 C) 98 F (36.7 C)  98.1 F (36.7 C)  TempSrc: Oral Oral    SpO2: 99% 99% 96% 100%  Weight:  71.3 kg    Height:        General: Pt is alert, awake, not in acute distress Cardiovascular: RRR, S1/S2 +, no rubs, no gallops Respiratory: CTA bilaterally, no wheezing, no rhonchi Abdominal: Soft, NT, ND, bowel sounds + Extremities: no edema, no cyanosis    The results of significant diagnostics from this hospitalization (including imaging, microbiology, ancillary and laboratory) are listed below for reference.     Microbiology: Recent Results (from the past 240 hour(s))  Blood culture (routine x 2)     Status: None   Collection Time: 09/04/17  3:58 PM  Result Value Ref Range Status   Specimen Description BLOOD LEFT WRIST  Final   Special Requests   Final    BOTTLES DRAWN AEROBIC AND ANAEROBIC Blood Culture adequate volume   Culture   Final    NO GROWTH 5 DAYS Performed at [facility], [address] [place], [place] [address]    Report Status 09/09/2017 FINAL  Final  Blood culture (routine x 2)     Status: None   Collection Time: 09/04/17  5:04 PM  Result Value Ref Range Status   Specimen Description BLOOD RIGHT HAND  Final   Special Requests   Final    BOTTLES DRAWN AEROBIC AND ANAEROBIC Blood Culture adequate volume   Culture   Final    NO GROWTH 5 DAYS Performed at [facility], [address] [place], [place] [address]    Report Status 09/09/2017 FINAL  Final  Urine culture     Status: None   Collection Time: 09/04/17  5:20 PM  Result Value Ref Range Status   Specimen Description   Final    URINE, CATHETERIZED Performed at [facility], [address], [place], [place] [address]    Special Requests   Final    NONE Performed at [facility], [address] [place], [place] [address]    Culture   Final    NO GROWTH Performed at [facility], [place] [address], [place], [place] [address]    Report Status 09/07/2017 FINAL  Final  Urine culture     Status: None  Collection Time: 09/06/17  4:00 PM  Result Value Ref Range Status   Specimen Description   Final    URINE, RANDOM Performed at [facility], [address] [place], [place] [address]    Special Requests   Final    NONE Performed at [facility], [address] [place], [place] [address]    Culture   Final    NO GROWTH Performed at [facility], [place] [address], [place], [place] [address]    Report Status 09/08/2017 FINAL  Final  MRSA PCR Screening     Status: None   Collection Time: 09/06/17  8:01 PM  Result Value Ref Range Status   MRSA by PCR NEGATIVE NEGATIVE Final    Comment:        The GeneXpert MRSA Assay (FDA approved for NASAL specimens only), is one component of a comprehensive MRSA colonization surveillance program. It is not intended to diagnose MRSA infection nor to guide or monitor treatment for MRSA infections. Performed at [facility], [address] [place], [place] [address]      Labs: BNP (last 3 results) No results for input(s): BNP in the last 8760 hours. Basic Metabolic Panel: Recent Labs  Lab 09/09/17 0517 09/10/17 0632 09/11/17 0624 09/12/17 0525 09/13/17 0534  NA 145  145 144 142 140 141  K 4.1  4.2 4.3 4.2 4.3 4.3  CL 120*  120* 119* 113* 114* 112*  CO2 18*  17* 18* 21* 21* 21*  GLUCOSE 96  97 113* 110* 98 105*  BUN 45*  46* 40* 40* 42* 40*  CREATININE 5.87*  5.84* 5.38* 5.50* 5.46* 5.74*  CALCIUM 9.3  9.3 9.5 9.8 9.7 9.8  PHOS 4.1 3.9 3.8 3.9 4.0   Liver Function Tests: Recent Labs  Lab 09/06/17 1518 09/09/17 0517 09/10/17 2956 09/11/17 0624 09/12/17 0525 09/13/17 0534  AST 13*  --   --   --    --   --   ALT 13  --   --   --   --   --   ALKPHOS 75  --   --   --   --   --   BILITOT 0.8  --   --   --   --   --   PROT 6.8  --   --   --   --   --   ALBUMIN 3.2* 2.5* 2.7* 2.7* 2.9* 2.9*   No results for input(s): LIPASE, AMYLASE in the last 168 hours. No results for input(s): AMMONIA in the last 168 hours. CBC: Recent Labs  Lab 09/06/17 1518 09/08/17 0454 09/09/17 0517 09/10/17 0632 09/13/17 0534  WBC 12.9* 8.2 7.2 6.4 5.7  NEUTROABS 10.5*  --   --   --   --   HGB 10.1* 8.8* 8.5* 8.4* 9.4*  HCT 31.1* 27.0* 26.8* 26.3* 29.5*  MCV 84.3 84.4 84.8 85.1 85.8  PLT 214 216 209 202 211   Cardiac Enzymes: Recent Labs  Lab 09/06/17 1518  TROPONINI <0.03   BNP: Invalid input(s): POCBNP CBG: Recent Labs  Lab 09/12/17 1109 09/12/17 1645 09/12/17 2137 09/13/17 0743 09/13/17 1110  GLUCAP 124* 102* 115* 91 124*   D-Dimer No results for input(s): DDIMER in the last 72 hours. Hgb A1c No results for input(s): HGBA1C in the last 72 hours. Lipid Profile No results for input(s): CHOL, HDL, LDLCALC, TRIG, CHOLHDL, LDLDIRECT in the last 72 hours. Thyroid function studies No results for input(s): TSH, T4TOTAL, T3FREE,  THYROIDAB in the last 72 hours.  Invalid input(s): FREET3 Anemia work up No results for input(s): VITAMINB12, FOLATE, FERRITIN, TIBC, IRON, RETICCTPCT in the last 72 hours. Urinalysis    Component Value Date/Time   COLORURINE YELLOW 09/06/2017 1600   APPEARANCEUR CLEAR 09/06/2017 1600   LABSPEC 1.011 09/06/2017 1600   PHURINE 5.0 09/06/2017 1600   GLUCOSEU NEGATIVE 09/06/2017 1600   HGBUR MODERATE (A) 09/06/2017 1600   BILIRUBINUR NEGATIVE 09/06/2017 1600   KETONESUR NEGATIVE 09/06/2017 1600   PROTEINUR 100 (A) 09/06/2017 1600   UROBILINOGEN 0.2 02/08/2008 0204   NITRITE NEGATIVE 09/06/2017 1600   LEUKOCYTESUR NEGATIVE 09/06/2017 1600   Sepsis Labs Invalid input(s): PROCALCITONIN,  WBC,  LACTICIDVEN Microbiology Recent Results (from the past 240  hour(s))  Blood culture (routine x 2)     Status: None   Collection Time: 09/04/17  3:58 PM  Result Value Ref Range Status   Specimen Description BLOOD LEFT WRIST  Final   Special Requests   Final    BOTTLES DRAWN AEROBIC AND ANAEROBIC Blood Culture adequate volume   Culture   Final    NO GROWTH 5 DAYS Performed at [facility], [address], [place], [place] [address]    Report Status 09/09/2017 FINAL  Final  Blood culture (routine x 2)     Status: None   Collection Time: 09/04/17  5:04 PM  Result Value Ref Range Status   Specimen Description BLOOD RIGHT HAND  Final   Special Requests   Final    BOTTLES DRAWN AEROBIC AND ANAEROBIC Blood Culture adequate volume   Culture   Final    NO GROWTH 5 DAYS Performed at [facility], [address], [place], [place] [address]    Report Status 09/09/2017 FINAL  Final  Urine culture     Status: None   Collection Time: 09/04/17  5:20 PM  Result Value Ref Range Status   Specimen Description   Final    URINE, CATHETERIZED Performed at [facility], [address] [place], [place] [address]    Special Requests   Final    NONE Performed at [facility], [address] [place], [place] [address]    Culture   Final    NO GROWTH Performed at [facility], [place] [address] [place], [place] [address]    Report Status 09/07/2017 FINAL  Final  Urine culture     Status: None   Collection Time: 09/06/17  4:00 PM  Result Value Ref Range Status   Specimen Description   Final    URINE, RANDOM Performed at [facility], [address], [place], [place] [address]    Special Requests   Final    NONE Performed at [facility], [address] [place], [place] [address]    Culture   Final    NO GROWTH Performed at [facility], [place] [address], [place],  [address]    Report Status 09/08/2017 FINAL  Final  MRSA PCR Screening     Status: None   Collection Time: 09/06/17  8:01 PM  Result Value Ref Range Status   MRSA by PCR  NEGATIVE NEGATIVE Final    Comment:        The GeneXpert MRSA Assay (FDA approved for NASAL specimens only), is one component of a comprehensive MRSA colonization surveillance program. It is not intended to diagnose MRSA infection nor to guide or monitor treatment for MRSA infections. Performed at [facility], [address] [place], [place]  [address]      Time coordinating discharge: 59mins  SIGNED:   Kathie Dike, MD  [facility] 09/13/2017, 2:57 PM Pager   If 7PM-7AM, please contact night-coverage [email or web address] Password [email or web address]

## 2017-09-13 NOTE — Progress Notes (Signed)
Discharge plan discussed with patients son and wife.  Patient son verbalized understanding of instructions. Report called to [facility] RN.  Patient discharged to [facility], via EMS in stable condition.

## 2017-09-14 ENCOUNTER — Telehealth: Payer: Self-pay | Admitting: *Deleted

## 2017-09-14 DIAGNOSIS — M6281 Muscle weakness (generalized): Secondary | ICD-10-CM | POA: Diagnosis not present

## 2017-09-14 DIAGNOSIS — I10 Essential (primary) hypertension: Secondary | ICD-10-CM | POA: Diagnosis not present

## 2017-09-14 DIAGNOSIS — N179 Acute kidney failure, unspecified: Secondary | ICD-10-CM | POA: Diagnosis not present

## 2017-09-14 DIAGNOSIS — E1322 Other specified diabetes mellitus with diabetic chronic kidney disease: Secondary | ICD-10-CM | POA: Diagnosis not present

## 2017-09-14 NOTE — Telephone Encounter (Signed)
Call placed to patient son, Rjay Revolorio.  Made aware of provider recommendations. Verbalized understanding.   States that patient wife is going to be moved to respite care while patient is not in home. Will need FL2 at that time. Advised that FL2 can be completed at [facility].

## 2017-09-14 NOTE — Telephone Encounter (Signed)
-----   Message from [name], MD sent at 09/13/2017  7:57 PM EDT ----- Regarding: SNF, new patient   Please let family know, that legally we can not complete any paperwork with regarding his SNF or when he goes home with Home Health as he has not established care and does not have appt until Jan, since he missed 2 new patient appointments.  Please make sure any paperwork goes to his current PCP  Also recommend Mrs. Belcourt stay with someone until he gets out of the SNF. Not safe for her to stay alone

## 2017-09-15 DIAGNOSIS — E1322 Other specified diabetes mellitus with diabetic chronic kidney disease: Secondary | ICD-10-CM | POA: Diagnosis not present

## 2017-09-15 DIAGNOSIS — M6281 Muscle weakness (generalized): Secondary | ICD-10-CM | POA: Diagnosis not present

## 2017-09-15 DIAGNOSIS — N185 Chronic kidney disease, stage 5: Secondary | ICD-10-CM | POA: Diagnosis not present

## 2017-09-15 DIAGNOSIS — I10 Essential (primary) hypertension: Secondary | ICD-10-CM | POA: Diagnosis not present

## 2017-09-19 DIAGNOSIS — M6281 Muscle weakness (generalized): Secondary | ICD-10-CM | POA: Diagnosis not present

## 2017-09-19 DIAGNOSIS — R69 Illness, unspecified: Secondary | ICD-10-CM | POA: Diagnosis not present

## 2017-09-19 DIAGNOSIS — I10 Essential (primary) hypertension: Secondary | ICD-10-CM | POA: Diagnosis not present

## 2017-09-19 DIAGNOSIS — N179 Acute kidney failure, unspecified: Secondary | ICD-10-CM | POA: Diagnosis not present

## 2017-09-20 DIAGNOSIS — J45909 Unspecified asthma, uncomplicated: Secondary | ICD-10-CM | POA: Diagnosis not present

## 2017-09-20 DIAGNOSIS — N185 Chronic kidney disease, stage 5: Secondary | ICD-10-CM | POA: Diagnosis not present

## 2017-09-20 DIAGNOSIS — R69 Illness, unspecified: Secondary | ICD-10-CM | POA: Diagnosis not present

## 2017-09-20 DIAGNOSIS — M109 Gout, unspecified: Secondary | ICD-10-CM | POA: Diagnosis not present

## 2017-09-20 DIAGNOSIS — E785 Hyperlipidemia, unspecified: Secondary | ICD-10-CM | POA: Diagnosis not present

## 2017-09-20 DIAGNOSIS — M6281 Muscle weakness (generalized): Secondary | ICD-10-CM | POA: Diagnosis not present

## 2017-09-20 DIAGNOSIS — G8929 Other chronic pain: Secondary | ICD-10-CM | POA: Diagnosis not present

## 2017-09-20 DIAGNOSIS — I120 Hypertensive chronic kidney disease with stage 5 chronic kidney disease or end stage renal disease: Secondary | ICD-10-CM | POA: Diagnosis not present

## 2017-09-20 DIAGNOSIS — E1122 Type 2 diabetes mellitus with diabetic chronic kidney disease: Secondary | ICD-10-CM | POA: Diagnosis not present

## 2017-09-20 DIAGNOSIS — D631 Anemia in chronic kidney disease: Secondary | ICD-10-CM | POA: Diagnosis not present

## 2017-09-20 NOTE — Telephone Encounter (Signed)
Received call from [place], [facility] PT with [facility]. [phone]~ telephone.   Requested [facility] for [facility] services. Advised of provider recommendations that until patient establishes care with [facility], we cannot give [place] orders.   Advised to have [place] contact prior PCP.

## 2017-09-22 DIAGNOSIS — N185 Chronic kidney disease, stage 5: Secondary | ICD-10-CM | POA: Diagnosis not present

## 2017-09-22 DIAGNOSIS — M109 Gout, unspecified: Secondary | ICD-10-CM | POA: Diagnosis not present

## 2017-09-22 DIAGNOSIS — M6281 Muscle weakness (generalized): Secondary | ICD-10-CM | POA: Diagnosis not present

## 2017-09-22 DIAGNOSIS — I120 Hypertensive chronic kidney disease with stage 5 chronic kidney disease or end stage renal disease: Secondary | ICD-10-CM | POA: Diagnosis not present

## 2017-09-22 DIAGNOSIS — G8929 Other chronic pain: Secondary | ICD-10-CM | POA: Diagnosis not present

## 2017-09-22 DIAGNOSIS — E785 Hyperlipidemia, unspecified: Secondary | ICD-10-CM | POA: Diagnosis not present

## 2017-09-22 DIAGNOSIS — R69 Illness, unspecified: Secondary | ICD-10-CM | POA: Diagnosis not present

## 2017-09-22 DIAGNOSIS — E1122 Type 2 diabetes mellitus with diabetic chronic kidney disease: Secondary | ICD-10-CM | POA: Diagnosis not present

## 2017-09-22 DIAGNOSIS — J45909 Unspecified asthma, uncomplicated: Secondary | ICD-10-CM | POA: Diagnosis not present

## 2017-09-22 DIAGNOSIS — D631 Anemia in chronic kidney disease: Secondary | ICD-10-CM | POA: Diagnosis not present

## 2017-09-26 ENCOUNTER — Encounter (HOSPITAL_COMMUNITY)
Admission: RE | Admit: 2017-09-26 | Discharge: 2017-09-26 | Disposition: A | Discharge status: Home / Self Care | Payer: Medicare HMO | Source: Ambulatory Visit | Attending: Nephrology | Admitting: Nephrology

## 2017-09-26 ENCOUNTER — Encounter (HOSPITAL_COMMUNITY): Payer: Self-pay

## 2017-09-26 DIAGNOSIS — N184 Chronic kidney disease, stage 4 (severe): Secondary | ICD-10-CM | POA: Diagnosis not present

## 2017-09-26 DIAGNOSIS — D631 Anemia in chronic kidney disease: Secondary | ICD-10-CM | POA: Insufficient documentation

## 2017-09-26 LAB — RENAL FUNCTION PANEL
ANION GAP: 10 (ref 5–15)
Albumin: 3.6 g/dL (ref 3.5–5.0)
BUN: 63 mg/dL — ABNORMAL HIGH (ref 8–23)
CO2: 20 mmol/L — AB (ref 22–32)
Calcium: 9.5 mg/dL (ref 8.9–10.3)
Chloride: 110 mmol/L (ref 98–111)
Creatinine, Ser: 6.51 mg/dL — ABNORMAL HIGH (ref 0.61–1.24)
GFR, EST AFRICAN AMERICAN: 8 mL/min — AB (ref >60)
GFR, EST NON AFRICAN AMERICAN: 7 mL/min — AB (ref >60)
Glucose, Bld: 136 mg/dL — ABNORMAL HIGH (ref 70–99)
Phosphorus: 4 mg/dL (ref 2.5–4.6)
Potassium: 4.2 mmol/L (ref 3.5–5.1)
SODIUM: 140 mmol/L (ref 135–145)

## 2017-09-26 LAB — FERRITIN: Ferritin: 578 ng/mL — ABNORMAL HIGH (ref 24–336)

## 2017-09-26 LAB — IRON AND TIBC
IRON: 60 ug/dL (ref 45–182)
Saturation Ratios: 26 % (ref 17.9–39.5)
TIBC: 230 ug/dL — AB (ref 250–450)
UIBC: 170 ug/dL

## 2017-09-26 MED ORDER — DARBEPOETIN ALFA 100 MCG/0.5ML IJ SOSY
100.0000 ug | PREFILLED_SYRINGE | Freq: Once | INTRAMUSCULAR | Status: AC
  Administered 2017-09-26: 100 ug via SUBCUTANEOUS
  Filled 2017-09-26: qty 0.5

## 2017-09-27 DIAGNOSIS — D631 Anemia in chronic kidney disease: Secondary | ICD-10-CM | POA: Diagnosis not present

## 2017-09-27 DIAGNOSIS — M109 Gout, unspecified: Secondary | ICD-10-CM | POA: Diagnosis not present

## 2017-09-27 DIAGNOSIS — J45909 Unspecified asthma, uncomplicated: Secondary | ICD-10-CM | POA: Diagnosis not present

## 2017-09-27 DIAGNOSIS — E1122 Type 2 diabetes mellitus with diabetic chronic kidney disease: Secondary | ICD-10-CM | POA: Diagnosis not present

## 2017-09-27 DIAGNOSIS — E785 Hyperlipidemia, unspecified: Secondary | ICD-10-CM | POA: Diagnosis not present

## 2017-09-27 DIAGNOSIS — G8929 Other chronic pain: Secondary | ICD-10-CM | POA: Diagnosis not present

## 2017-09-27 DIAGNOSIS — M6281 Muscle weakness (generalized): Secondary | ICD-10-CM | POA: Diagnosis not present

## 2017-09-27 DIAGNOSIS — I120 Hypertensive chronic kidney disease with stage 5 chronic kidney disease or end stage renal disease: Secondary | ICD-10-CM | POA: Diagnosis not present

## 2017-09-27 DIAGNOSIS — N185 Chronic kidney disease, stage 5: Secondary | ICD-10-CM | POA: Diagnosis not present

## 2017-09-27 DIAGNOSIS — R69 Illness, unspecified: Secondary | ICD-10-CM | POA: Diagnosis not present

## 2017-09-27 LAB — PTH, INTACT AND CALCIUM
Calcium, Total (PTH): 9.5 mg/dL (ref 8.6–10.2)
PTH: 87 pg/mL — ABNORMAL HIGH (ref 15–65)

## 2017-09-29 ENCOUNTER — Encounter: Payer: Self-pay | Admitting: Physician Assistant

## 2017-09-29 ENCOUNTER — Ambulatory Visit (INDEPENDENT_AMBULATORY_CARE_PROVIDER_SITE_OTHER): Payer: Medicare HMO | Admitting: Physician Assistant

## 2017-09-29 VITALS — BP 136/70 | HR 71 | Temp 98.4°F | Resp 16 | Ht 65.0 in | Wt 156.0 lb

## 2017-09-29 DIAGNOSIS — R531 Weakness: Secondary | ICD-10-CM

## 2017-09-29 DIAGNOSIS — D638 Anemia in other chronic diseases classified elsewhere: Secondary | ICD-10-CM

## 2017-09-29 DIAGNOSIS — R69 Illness, unspecified: Secondary | ICD-10-CM | POA: Diagnosis not present

## 2017-09-29 DIAGNOSIS — M6281 Muscle weakness (generalized): Secondary | ICD-10-CM | POA: Diagnosis not present

## 2017-09-29 DIAGNOSIS — I120 Hypertensive chronic kidney disease with stage 5 chronic kidney disease or end stage renal disease: Secondary | ICD-10-CM | POA: Diagnosis not present

## 2017-09-29 DIAGNOSIS — N185 Chronic kidney disease, stage 5: Secondary | ICD-10-CM

## 2017-09-29 DIAGNOSIS — Z23 Encounter for immunization: Secondary | ICD-10-CM

## 2017-09-29 DIAGNOSIS — D631 Anemia in chronic kidney disease: Secondary | ICD-10-CM | POA: Diagnosis not present

## 2017-09-29 DIAGNOSIS — E785 Hyperlipidemia, unspecified: Secondary | ICD-10-CM | POA: Diagnosis not present

## 2017-09-29 DIAGNOSIS — M109 Gout, unspecified: Secondary | ICD-10-CM | POA: Diagnosis not present

## 2017-09-29 DIAGNOSIS — E1122 Type 2 diabetes mellitus with diabetic chronic kidney disease: Secondary | ICD-10-CM | POA: Diagnosis not present

## 2017-09-29 DIAGNOSIS — Z09 Encounter for follow-up examination after completed treatment for conditions other than malignant neoplasm: Secondary | ICD-10-CM

## 2017-09-29 DIAGNOSIS — G8929 Other chronic pain: Secondary | ICD-10-CM | POA: Diagnosis not present

## 2017-09-29 DIAGNOSIS — J45909 Unspecified asthma, uncomplicated: Secondary | ICD-10-CM | POA: Diagnosis not present

## 2017-09-29 LAB — POCT HEMOGLOBIN-HEMACUE: HEMOGLOBIN: 10.2 g/dL — AB (ref 13.0–17.0)

## 2017-09-29 NOTE — Progress Notes (Signed)
Patient ID: Miguel Tucker MRN: [id number], DOB: 17-Nov-1930, 84 y.o. Date of Encounter: _0 @  Chief Complaint:  Chief Complaint  Patient presents with  . follow up from rehab  . Flu Vaccine    HPI: 84 y.o. year old male  presents with above.   Accompanying him for visit today----his wife and his son.  From what the son says, sounds like Dr. Buelah Manis had excepted Miguel Tucker is a new patient--- then something was going on with the patient's wife and he was unable to get to that initially scheduled to visit to establish care with Dr. Buelah Manis.  The new patient establish care appointment then got rescheduled.  Son is asking when that is scheduled for.  Reviewed that he is scheduled to see her January and I have written down that specific information with date and time for them.  Son  also has some questions about   " stage 5 chronic kidney disease".  Otherwise they have no specific concerns to address today.  I did review recent hospital discharge summary. The following information is copied directly from that discharge summary.  The following information was reviewed by me today.:  Admit date: 09/06/2017 Discharge date: 09/13/2017  Admitted From: Home Disposition: Skilled nursing facility  Recommendations for Outpatient Follow-up:  1. Follow up with PCP in 1-2 weeks 2. Please obtain BMP/CBC in one week 3. Follow-up with nephrology in 2 weeks  Discharge Condition: Stable CODE STATUS: Full code Diet recommendation: Heart healthy, carb modified  Brief/Interim Summary: 84 year old male with history of dementia, hypertension, chronic kidney disease stage V, who lives at home with his wife, was brought to the hospital with generalized weakness with an inability to ambulate.  On arrival to the emergency room, he was noted to have acute kidney injury and chronic kidney disease, with dehydrated and was admitted for further treatments.  Discharge Diagnoses:  Principal  Problem:   Generalized weakness Active Problems:   Diabetes mellitus (HCC)   CKD (chronic kidney disease) stage 5, GFR less than 15 ml/min (HCC)   HTN (hypertension)   Anemia, chronic disease   Acute on chronic renal failure (HCC)   Dementia   Acute kidney injury superimposed on chronic kidney disease ([place])  1. Generalized weakness.  Secondary to deconditioning.  Physical therapy evaluated the patient recommended skilled nursing facility placement. 2. Acute kidney injury on chronic kidney disease stage V.  Felt to be related to dehydration in the setting of Lasix as well as ARB.  Both Lasix and ARB have been held.  He was hydrated with IV fluids and creatinine appears to have returned to baseline.  No indications for dialysis at this time.  He will follow-up with nephrology in 2 weeks. 3. Hypertension.  Blood pressure is better controlled after the addition of amlodipine in addition to labetalol.  Continue managed as an outpatient 4. Dementia.  Stable 5. Anemia of chronic kidney disease.  Hemoglobin has been stable.     --------------------------------------------------------------------------------------------------------- Today: Today they report that things have seemed to be stable since discharge home from the hospital.  No specific concerns to address today. Was going to obtain follow-up CBC and be met that was recommended by discharge summary.  However I reviewed that he is already had labs drawn on 09/26/2017 by Dr. Rolan Lipa. Patient's son reports that yes they did just recently go for labs and have follow-up appointment scheduled with the nephrologist on Monday.         Past Medical  History:  Diagnosis Date  . Asthma   . Chronic kidney disease   . Chronic knee pain   . Dementia   . Diabetes mellitus    type 2  . Frequent urination at night   . Gout   . Hard of hearing   . High cholesterol   . Hypertension   . Hypokalemia   . Radicular pain of left lower  extremity      Home Meds: Outpatient Medications Prior to Visit  Medication Sig Dispense Refill  . allopurinol (ZYLOPRIM) 100 MG tablet Take 100 mg by mouth daily.     [name] amLODipine (NORVASC) 10 MG tablet Take 1 tablet (10 mg total) by mouth daily.    [name] aspirin EC 81 MG tablet Take 81 mg by mouth every morning.     . calcitRIOL (ROCALTROL) 0.25 MCG capsule Take 0.25 mcg by mouth daily.     . ferrous sulfate 325 (65 FE) MG tablet Take 325 mg by mouth daily with breakfast.    . finasteride (PROSCAR) 5 MG tablet Take 5 mg by mouth daily.    [name] labetalol (NORMODYNE) 200 MG tablet Take 200 mg by mouth 2 (two) times daily.     . Misc. Devices (ADJUSTABLE ALUMINUM CANE) MISC Use the cane as needed for weight bearing 1 each 0  . sodium bicarbonate 650 MG tablet Take 1 tablet (650 mg total) by mouth 2 (two) times daily.    . traMADol (ULTRAM) 50 MG tablet Take 1 tablet (50 mg total) by mouth every 12 (twelve) hours as needed. 10 tablet 0   No facility-administered medications prior to visit.     Allergies:  Allergies  Allergen Reactions  . Penicillins Itching and Other (See Comments)    On arms only. Has patient had a PCN reaction causing immediate rash, facial/tongue/throat swelling, SOB or lightheadedness with hypotension: No Has patient had a PCN reaction causing severe rash involving mucus membranes or skin necrosis: No Has patient had a PCN reaction that required hospitalization No Has patient had a PCN reaction occurring within the last 10 years: No If all of the above answers are "NO", then may proceed with Cephalosporin use.     Social History   Socioeconomic History  . Marital status: Married    Spouse name: Not on file  . Number of children: 3  . Years of education: 10  . Highest education level: Not on file  Occupational History  . Occupation: Retired  Scientific laboratory technician  . Financial resource strain: Not on file  . Food insecurity:    Worry: Not on file    Inability: Not on  file  . Transportation needs:    Medical: Not on file    Non-medical: Not on file  Tobacco Use  . Smoking status: Former Smoker    Last attempt to quit: 11/06/1975    Years since quitting: 41.9  . Smokeless tobacco: Never Used  Substance and Sexual Activity  . Alcohol use: No  . Drug use: No  . Sexual activity: Not on file  Lifestyle  . Physical activity:    Days per week: Not on file    Minutes per session: Not on file  . Stress: Not on file  Relationships  . Social connections:    Talks on phone: Not on file    Gets together: Not on file    Attends religious service: Not on file    Active member of club or organization: Not on file  Attends meetings of clubs or organizations: Not on file    Relationship status: Not on file  . Intimate partner violence:    Fear of current or ex partner: Not on file    Emotionally abused: Not on file    Physically abused: Not on file    Forced sexual activity: Not on file  Other Topics Concern  . Not on file  Social History Narrative   Lives with spouse   Caffeine use:  none   Right handed    History reviewed. No pertinent family history.   Review of Systems:  See HPI for pertinent ROS. All other ROS negative.    Physical Exam: Blood pressure 136/70, pulse 71, temperature 98.4 F (36.9 C), temperature source Oral, resp. rate 16, height _0  (1.651 m), weight 70.8 kg, SpO2 97 %., Body mass index is 25.96 kg/m. General:  WNWD AAM. Appears in no acute distress. Neck: Supple. No thyromegaly. No lymphadenopathy. Lungs: Clear bilaterally to auscultation without wheezes, rales, or rhonchi. Breathing is unlabored. Heart: RRR with S1 S2. No murmurs, rubs, or gallops. Abdomen: Soft, non-tender, non-distended with normoactive bowel sounds. No hepatomegaly. No rebound/guarding. No obvious abdominal masses. Musculoskeletal:  Strength and tone normal for age. Extremities/Skin: Warm and dry. No edema.  Neuro: Alert and oriented X 3. Moves  all extremities spontaneously. Gait is normal. CNII-XII grossly in tact. Psych:  Responds to questions appropriately with a normal affect.     ASSESSMENT AND PLAN:  84 y.o. year old male with   1. Hospital discharge follow-up He is stable with no specific concerns.  Blood pressure and other vital signs are stable.  He has had follow-up labs and has follow-up visit scheduled to see Dr. Rolan Lipa on Monday.  2. CKD (chronic kidney disease) stage 5, GFR less than 15 ml/min (HCC) He has had follow-up labs and has appointment scheduled with Dr. Clover Mealy for Monday.  He is stable to wait for that follow-up visit with her.  3. Anemia, chronic disease His anemia is now being managed by nephrology.  4. Generalized weakness His weakness is stable.  He has visit scheduled for this Monday at nephrology. Does have his appointment scheduled with Dr. Buelah Manis to establish care with her.   [address] [place], [place], [facility] 09/29/2017 3:32 PM

## 2017-10-03 DIAGNOSIS — Z6830 Body mass index (BMI) 30.0-30.9, adult: Secondary | ICD-10-CM | POA: Diagnosis not present

## 2017-10-03 DIAGNOSIS — E1122 Type 2 diabetes mellitus with diabetic chronic kidney disease: Secondary | ICD-10-CM | POA: Diagnosis not present

## 2017-10-03 DIAGNOSIS — N184 Chronic kidney disease, stage 4 (severe): Secondary | ICD-10-CM | POA: Diagnosis not present

## 2017-10-03 DIAGNOSIS — I129 Hypertensive chronic kidney disease with stage 1 through stage 4 chronic kidney disease, or unspecified chronic kidney disease: Secondary | ICD-10-CM | POA: Diagnosis not present

## 2017-10-03 DIAGNOSIS — N2581 Secondary hyperparathyroidism of renal origin: Secondary | ICD-10-CM | POA: Diagnosis not present

## 2017-10-03 DIAGNOSIS — D631 Anemia in chronic kidney disease: Secondary | ICD-10-CM | POA: Diagnosis not present

## 2017-10-04 ENCOUNTER — Other Ambulatory Visit: Payer: Self-pay

## 2017-10-04 DIAGNOSIS — N186 End stage renal disease: Secondary | ICD-10-CM

## 2017-10-04 DIAGNOSIS — E1122 Type 2 diabetes mellitus with diabetic chronic kidney disease: Secondary | ICD-10-CM | POA: Diagnosis not present

## 2017-10-04 DIAGNOSIS — N185 Chronic kidney disease, stage 5: Secondary | ICD-10-CM | POA: Diagnosis not present

## 2017-10-04 DIAGNOSIS — R69 Illness, unspecified: Secondary | ICD-10-CM | POA: Diagnosis not present

## 2017-10-04 DIAGNOSIS — J45909 Unspecified asthma, uncomplicated: Secondary | ICD-10-CM | POA: Diagnosis not present

## 2017-10-04 DIAGNOSIS — G8929 Other chronic pain: Secondary | ICD-10-CM | POA: Diagnosis not present

## 2017-10-04 DIAGNOSIS — M6281 Muscle weakness (generalized): Secondary | ICD-10-CM | POA: Diagnosis not present

## 2017-10-04 DIAGNOSIS — E785 Hyperlipidemia, unspecified: Secondary | ICD-10-CM | POA: Diagnosis not present

## 2017-10-04 DIAGNOSIS — I120 Hypertensive chronic kidney disease with stage 5 chronic kidney disease or end stage renal disease: Secondary | ICD-10-CM | POA: Diagnosis not present

## 2017-10-04 DIAGNOSIS — D631 Anemia in chronic kidney disease: Secondary | ICD-10-CM | POA: Diagnosis not present

## 2017-10-04 DIAGNOSIS — M109 Gout, unspecified: Secondary | ICD-10-CM | POA: Diagnosis not present

## 2017-10-05 DIAGNOSIS — M6281 Muscle weakness (generalized): Secondary | ICD-10-CM | POA: Diagnosis not present

## 2017-10-05 DIAGNOSIS — G8929 Other chronic pain: Secondary | ICD-10-CM | POA: Diagnosis not present

## 2017-10-05 DIAGNOSIS — E785 Hyperlipidemia, unspecified: Secondary | ICD-10-CM | POA: Diagnosis not present

## 2017-10-05 DIAGNOSIS — I120 Hypertensive chronic kidney disease with stage 5 chronic kidney disease or end stage renal disease: Secondary | ICD-10-CM | POA: Diagnosis not present

## 2017-10-05 DIAGNOSIS — N185 Chronic kidney disease, stage 5: Secondary | ICD-10-CM | POA: Diagnosis not present

## 2017-10-05 DIAGNOSIS — E1122 Type 2 diabetes mellitus with diabetic chronic kidney disease: Secondary | ICD-10-CM | POA: Diagnosis not present

## 2017-10-05 DIAGNOSIS — D631 Anemia in chronic kidney disease: Secondary | ICD-10-CM | POA: Diagnosis not present

## 2017-10-05 DIAGNOSIS — R69 Illness, unspecified: Secondary | ICD-10-CM | POA: Diagnosis not present

## 2017-10-05 DIAGNOSIS — J45909 Unspecified asthma, uncomplicated: Secondary | ICD-10-CM | POA: Diagnosis not present

## 2017-10-05 DIAGNOSIS — M109 Gout, unspecified: Secondary | ICD-10-CM | POA: Diagnosis not present

## 2017-10-06 DIAGNOSIS — M109 Gout, unspecified: Secondary | ICD-10-CM | POA: Diagnosis not present

## 2017-10-06 DIAGNOSIS — E785 Hyperlipidemia, unspecified: Secondary | ICD-10-CM | POA: Diagnosis not present

## 2017-10-06 DIAGNOSIS — R69 Illness, unspecified: Secondary | ICD-10-CM | POA: Diagnosis not present

## 2017-10-06 DIAGNOSIS — E1122 Type 2 diabetes mellitus with diabetic chronic kidney disease: Secondary | ICD-10-CM | POA: Diagnosis not present

## 2017-10-06 DIAGNOSIS — I120 Hypertensive chronic kidney disease with stage 5 chronic kidney disease or end stage renal disease: Secondary | ICD-10-CM | POA: Diagnosis not present

## 2017-10-06 DIAGNOSIS — N185 Chronic kidney disease, stage 5: Secondary | ICD-10-CM | POA: Diagnosis not present

## 2017-10-06 DIAGNOSIS — G8929 Other chronic pain: Secondary | ICD-10-CM | POA: Diagnosis not present

## 2017-10-06 DIAGNOSIS — D631 Anemia in chronic kidney disease: Secondary | ICD-10-CM | POA: Diagnosis not present

## 2017-10-06 DIAGNOSIS — M6281 Muscle weakness (generalized): Secondary | ICD-10-CM | POA: Diagnosis not present

## 2017-10-06 DIAGNOSIS — J45909 Unspecified asthma, uncomplicated: Secondary | ICD-10-CM | POA: Diagnosis not present

## 2017-10-10 ENCOUNTER — Encounter: Payer: Self-pay | Admitting: Vascular Surgery

## 2017-10-10 ENCOUNTER — Ambulatory Visit (INDEPENDENT_AMBULATORY_CARE_PROVIDER_SITE_OTHER): Payer: Medicare HMO | Admitting: Vascular Surgery

## 2017-10-10 ENCOUNTER — Ambulatory Visit (HOSPITAL_COMMUNITY)
Admission: RE | Admit: 2017-10-10 | Discharge: 2017-10-10 | Disposition: A | Discharge status: Home / Self Care | Payer: Medicare HMO | Source: Ambulatory Visit | Attending: Vascular Surgery | Admitting: Vascular Surgery

## 2017-10-10 VITALS — BP 133/67 | HR 62 | Temp 97.1°F | Resp 18 | Ht 65.0 in | Wt 156.0 lb

## 2017-10-10 DIAGNOSIS — N186 End stage renal disease: Secondary | ICD-10-CM | POA: Diagnosis not present

## 2017-10-10 DIAGNOSIS — Z1389 Encounter for screening for other disorder: Secondary | ICD-10-CM | POA: Diagnosis not present

## 2017-10-10 NOTE — Progress Notes (Signed)
Vascular and Vein Specialist of [facility]  Patient name: Miguel Tucker MRN: [id number] DOB: [birth year] Sex: male  REASON FOR VISIT: Discuss access for hemodialysis  Seen today in our [place] office  HPI: Miguel Tucker is a 84 y.o. male here today for discussion of access for hemodialysis.  He is known to me from prior left arm access.  He had undergone staged left arm basilic vein transposition fistula by myself.  His last office visit was in April 2018.  At that time he had nicely matured fistula and was released to as needed follow-up.  He now has had progression to need for hemodialysis.  He unfortunately has thrombosed his left basilic vein fistula sometime in the last 18 months.  He is here today for discussion of new access.  He is right-handed.  He is here today with his wife and son.  He does have severe dementia.  His wife states that she simply wants to keep him alive and around with her.  Past Medical History:  Diagnosis Date  . Asthma   . Chronic kidney disease   . Chronic knee pain   . Dementia ([place])   . Diabetes mellitus    type 2  . Frequent urination at night   . Gout   . Hard of hearing   . High cholesterol   . Hypertension   . Hypokalemia   . Radicular pain of left lower extremity     History reviewed. No pertinent family history.  SOCIAL HISTORY: Social History   Tobacco Use  . Smoking status: Former Smoker    Last attempt to quit: 11/06/1975    Years since quitting: 41.9  . Smokeless tobacco: Never Used  Substance Use Topics  . Alcohol use: No    Allergies  Allergen Reactions  . Penicillins Itching and Other (See Comments)    On arms only. Has patient had a PCN reaction causing immediate rash, facial/tongue/throat swelling, SOB or lightheadedness with hypotension: No Has patient had a PCN reaction causing severe rash involving mucus membranes or skin necrosis: No Has patient had a PCN reaction  that required hospitalization No Has patient had a PCN reaction occurring within the last 10 years: No If all of the above answers are "NO", then may proceed with Cephalosporin use.     Current Outpatient Medications  Medication Sig Dispense Refill  . allopurinol (ZYLOPRIM) 100 MG tablet Take 100 mg by mouth daily.     [name] amLODipine (NORVASC) 10 MG tablet Take 1 tablet (10 mg total) by mouth daily.    [name] aspirin EC 81 MG tablet Take 81 mg by mouth every morning.     . calcitRIOL (ROCALTROL) 0.25 MCG capsule Take 0.25 mcg by mouth daily.     . ferrous sulfate 325 (65 FE) MG tablet Take 325 mg by mouth daily with breakfast.    . finasteride (PROSCAR) 5 MG tablet Take 5 mg by mouth daily.    [name] labetalol (NORMODYNE) 200 MG tablet Take 200 mg by mouth 2 (two) times daily.     . Misc. Devices (ADJUSTABLE ALUMINUM CANE) MISC Use the cane as needed for weight bearing 1 each 0  . sodium bicarbonate 650 MG tablet Take 1 tablet (650 mg total) by mouth 2 (two) times daily.    . traMADol (ULTRAM) 50 MG tablet Take 1 tablet (50 mg total) by mouth every 12 (twelve) hours as needed. 10 tablet 0   No current facility-administered medications for this  visit.     REVIEW OF SYSTEMS:  [X]  denotes positive finding, [ ]  denotes negative finding Cardiac  Comments:  Chest pain or chest pressure:    Shortness of breath upon exertion:    Short of breath when lying flat:    Irregular heart rhythm:        Vascular    Pain in calf, thigh, or hip brought on by ambulation:    Pain in feet at night that wakes you up from your sleep:     Blood clot in your veins:    Leg swelling:           PHYSICAL EXAM: Vitals:   10/10/17 1514  BP: 133/67  Pulse: 62  Resp: 18  Temp: (!) 97.1 F (36.2 C)  TempSrc: Temporal  Weight: 156 lb (70.8 kg)  Height: 5\' 5"  (1.651 m)    GENERAL: The patient is a well-nourished male, in no acute distress. The vital signs are documented above. CARDIOVASCULAR: [place]abbell radial  pulses bilaterally.  He does have a palpable thrombosed left arm basilic vein fistula.  All surface veins PULMONARY: There is good air exchange  MUSCULOSKELETAL: There are no major deformities or cyanosis. NEUROLOGIC: No focal weakness or paresthesias are detected. SKIN: There are no ulcers or rashes noted. PSYCHIATRIC: The patient has a normal affect.  DATA:  He underwent a right arm arterial and venous studies today showing small surface veins.  MEDICAL ISSUES: I discussed options at length with patient and his family present.  I would recommend a left arm AV graft.  He does have a small to moderate basilic vein on the right but would not recommend attempting this.  We have been requested to place a tunneled dialysis catheter at the time of his left arm access.  Discussed the procedure including the expectation of occasional maintenance with thrombectomy or thrombolysis of the graft.  We will proceed later in the week when we can get him on the operative schedule.    Rosetta Posner, MD FACS [facility] of [facility] Tel [phone] Pager [phone]

## 2017-10-11 ENCOUNTER — Other Ambulatory Visit: Payer: Self-pay | Admitting: *Deleted

## 2017-10-11 DIAGNOSIS — D631 Anemia in chronic kidney disease: Secondary | ICD-10-CM | POA: Diagnosis not present

## 2017-10-11 DIAGNOSIS — M6281 Muscle weakness (generalized): Secondary | ICD-10-CM | POA: Diagnosis not present

## 2017-10-11 DIAGNOSIS — N185 Chronic kidney disease, stage 5: Secondary | ICD-10-CM | POA: Diagnosis not present

## 2017-10-11 DIAGNOSIS — G8929 Other chronic pain: Secondary | ICD-10-CM | POA: Diagnosis not present

## 2017-10-11 DIAGNOSIS — R69 Illness, unspecified: Secondary | ICD-10-CM | POA: Diagnosis not present

## 2017-10-11 DIAGNOSIS — J45909 Unspecified asthma, uncomplicated: Secondary | ICD-10-CM | POA: Diagnosis not present

## 2017-10-11 DIAGNOSIS — E785 Hyperlipidemia, unspecified: Secondary | ICD-10-CM | POA: Diagnosis not present

## 2017-10-11 DIAGNOSIS — I120 Hypertensive chronic kidney disease with stage 5 chronic kidney disease or end stage renal disease: Secondary | ICD-10-CM | POA: Diagnosis not present

## 2017-10-11 DIAGNOSIS — E1122 Type 2 diabetes mellitus with diabetic chronic kidney disease: Secondary | ICD-10-CM | POA: Diagnosis not present

## 2017-10-11 DIAGNOSIS — M109 Gout, unspecified: Secondary | ICD-10-CM | POA: Diagnosis not present

## 2017-10-11 NOTE — Addendum Note (Signed)
Addended by: Luanne Bras on: 10/11/2017 08:42 AM   Modules accepted: Orders

## 2017-10-11 NOTE — Progress Notes (Signed)
Spoke with patient's son RONALD. Instructed to be at [facility] admitting department at 8:30 am on 10-18-17 for surgery(.First available requester per son). NPO past MN night prior must have a driver and care giver for discharge to home. Expect a call and follow the detailed instructions received from the hospital pre-admission department for this surgery. Verbalized understanding and to call this office if questions.

## 2017-10-13 DIAGNOSIS — M109 Gout, unspecified: Secondary | ICD-10-CM | POA: Diagnosis not present

## 2017-10-13 DIAGNOSIS — G8929 Other chronic pain: Secondary | ICD-10-CM | POA: Diagnosis not present

## 2017-10-13 DIAGNOSIS — N185 Chronic kidney disease, stage 5: Secondary | ICD-10-CM | POA: Diagnosis not present

## 2017-10-13 DIAGNOSIS — J45909 Unspecified asthma, uncomplicated: Secondary | ICD-10-CM | POA: Diagnosis not present

## 2017-10-13 DIAGNOSIS — R69 Illness, unspecified: Secondary | ICD-10-CM | POA: Diagnosis not present

## 2017-10-13 DIAGNOSIS — M6281 Muscle weakness (generalized): Secondary | ICD-10-CM | POA: Diagnosis not present

## 2017-10-13 DIAGNOSIS — E785 Hyperlipidemia, unspecified: Secondary | ICD-10-CM | POA: Diagnosis not present

## 2017-10-13 DIAGNOSIS — D631 Anemia in chronic kidney disease: Secondary | ICD-10-CM | POA: Diagnosis not present

## 2017-10-13 DIAGNOSIS — I120 Hypertensive chronic kidney disease with stage 5 chronic kidney disease or end stage renal disease: Secondary | ICD-10-CM | POA: Diagnosis not present

## 2017-10-13 DIAGNOSIS — E1122 Type 2 diabetes mellitus with diabetic chronic kidney disease: Secondary | ICD-10-CM | POA: Diagnosis not present

## 2017-10-14 ENCOUNTER — Telehealth: Payer: Self-pay | Admitting: *Deleted

## 2017-10-14 NOTE — Telephone Encounter (Signed)
Received fax from [place], patient's daughter in law for FMLA forms.   Call placed to patient for more information.   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   Reason FMLA requested: provide assistance with ADL's, supportive care and MD appointments for patient.   Verbalized that fee may be charged and is per provider prerogative.   Forms routed to provider.

## 2017-10-17 ENCOUNTER — Encounter (HOSPITAL_COMMUNITY): Payer: Self-pay | Admitting: *Deleted

## 2017-10-17 NOTE — Progress Notes (Signed)
Spoke to son regarding medical history and instructions. Son asked patient about chest pain or shob while I was on phone and he denied same. States they do not check patient's blood sugar.

## 2017-10-17 NOTE — Telephone Encounter (Signed)
Form completed.Returned to [place].

## 2017-10-18 ENCOUNTER — Ambulatory Visit (HOSPITAL_COMMUNITY): Payer: Medicare HMO

## 2017-10-18 ENCOUNTER — Ambulatory Visit (HOSPITAL_COMMUNITY): Payer: Medicare HMO | Admitting: Anesthesiology

## 2017-10-18 ENCOUNTER — Ambulatory Visit: Payer: Medicare HMO | Admitting: Adult Health

## 2017-10-18 ENCOUNTER — Ambulatory Visit (HOSPITAL_COMMUNITY)
Admission: RE | Admit: 2017-10-18 | Discharge: 2017-10-18 | Disposition: A | Discharge status: Home / Self Care | Payer: Medicare HMO | Source: Ambulatory Visit | Attending: Vascular Surgery | Admitting: Vascular Surgery

## 2017-10-18 ENCOUNTER — Encounter (HOSPITAL_COMMUNITY): Payer: Self-pay | Admitting: Certified Registered"

## 2017-10-18 ENCOUNTER — Encounter (HOSPITAL_COMMUNITY)
Admission: RE | Disposition: A | Discharge status: Home / Self Care | Payer: Self-pay | Source: Ambulatory Visit | Attending: Vascular Surgery

## 2017-10-18 DIAGNOSIS — Y832 Surgical operation with anastomosis, bypass or graft as the cause of abnormal reaction of the patient, or of later complication, without mention of misadventure at the time of the procedure: Secondary | ICD-10-CM | POA: Diagnosis not present

## 2017-10-18 DIAGNOSIS — Z79899 Other long term (current) drug therapy: Secondary | ICD-10-CM | POA: Diagnosis not present

## 2017-10-18 DIAGNOSIS — E1122 Type 2 diabetes mellitus with diabetic chronic kidney disease: Secondary | ICD-10-CM | POA: Diagnosis not present

## 2017-10-18 DIAGNOSIS — N186 End stage renal disease: Secondary | ICD-10-CM | POA: Insufficient documentation

## 2017-10-18 DIAGNOSIS — Z7982 Long term (current) use of aspirin: Secondary | ICD-10-CM | POA: Insufficient documentation

## 2017-10-18 DIAGNOSIS — T82868A Thrombosis of vascular prosthetic devices, implants and grafts, initial encounter: Secondary | ICD-10-CM | POA: Insufficient documentation

## 2017-10-18 DIAGNOSIS — Z79891 Long term (current) use of opiate analgesic: Secondary | ICD-10-CM | POA: Diagnosis not present

## 2017-10-18 DIAGNOSIS — M25569 Pain in unspecified knee: Secondary | ICD-10-CM | POA: Insufficient documentation

## 2017-10-18 DIAGNOSIS — Z992 Dependence on renal dialysis: Secondary | ICD-10-CM

## 2017-10-18 DIAGNOSIS — G8929 Other chronic pain: Secondary | ICD-10-CM | POA: Insufficient documentation

## 2017-10-18 DIAGNOSIS — Z87891 Personal history of nicotine dependence: Secondary | ICD-10-CM | POA: Insufficient documentation

## 2017-10-18 DIAGNOSIS — M109 Gout, unspecified: Secondary | ICD-10-CM | POA: Diagnosis not present

## 2017-10-18 DIAGNOSIS — F0390 Unspecified dementia without behavioral disturbance: Secondary | ICD-10-CM | POA: Diagnosis not present

## 2017-10-18 DIAGNOSIS — Z419 Encounter for procedure for purposes other than remedying health state, unspecified: Secondary | ICD-10-CM

## 2017-10-18 DIAGNOSIS — J90 Pleural effusion, not elsewhere classified: Secondary | ICD-10-CM | POA: Diagnosis not present

## 2017-10-18 DIAGNOSIS — I120 Hypertensive chronic kidney disease with stage 5 chronic kidney disease or end stage renal disease: Secondary | ICD-10-CM | POA: Insufficient documentation

## 2017-10-18 DIAGNOSIS — R69 Illness, unspecified: Secondary | ICD-10-CM | POA: Diagnosis not present

## 2017-10-18 HISTORY — PX: INSERTION OF DIALYSIS CATHETER: SHX1324

## 2017-10-18 HISTORY — PX: AV FISTULA PLACEMENT: SHX1204

## 2017-10-18 LAB — POCT I-STAT 4, (NA,K, GLUC, HGB,HCT)
GLUCOSE: 104 mg/dL — AB (ref 70–99)
HEMATOCRIT: 33 % — AB (ref 39.0–52.0)
HEMOGLOBIN: 11.2 g/dL — AB (ref 13.0–17.0)
POTASSIUM: 4.3 mmol/L (ref 3.5–5.1)
SODIUM: 141 mmol/L (ref 135–145)

## 2017-10-18 LAB — GLUCOSE, CAPILLARY: Glucose-Capillary: 87 mg/dL (ref 70–99)

## 2017-10-18 SURGERY — INSERTION OF ARTERIOVENOUS (AV) GORE-TEX GRAFT ARM
Anesthesia: Monitor Anesthesia Care | Site: Arm Upper | Laterality: Right

## 2017-10-18 MED ORDER — LIDOCAINE 2% (20 MG/ML) 5 ML SYRINGE
INTRAMUSCULAR | Status: AC
  Filled 2017-10-18: qty 5

## 2017-10-18 MED ORDER — SODIUM CHLORIDE 0.9 % IV SOLN
INTRAVENOUS | Status: DC | PRN
  Administered 2017-10-18: 14:00:00

## 2017-10-18 MED ORDER — ONDANSETRON HCL 4 MG/2ML IJ SOLN
INTRAMUSCULAR | Status: DC | PRN
  Administered 2017-10-18: 4 mg via INTRAVENOUS

## 2017-10-18 MED ORDER — VANCOMYCIN HCL IN DEXTROSE 1-5 GM/200ML-% IV SOLN
INTRAVENOUS | Status: AC
  Filled 2017-10-18: qty 200

## 2017-10-18 MED ORDER — LIDOCAINE 2% (20 MG/ML) 5 ML SYRINGE
INTRAMUSCULAR | Status: DC | PRN
  Administered 2017-10-18: 40 mg via INTRAVENOUS

## 2017-10-18 MED ORDER — HYDROCODONE-ACETAMINOPHEN 5-325 MG PO TABS: 1.0000 | ORAL_TABLET | Freq: Four times a day (QID) | ORAL | 0 refills | Status: DC | PRN

## 2017-10-18 MED ORDER — HEPARIN SODIUM (PORCINE) 1000 UNIT/ML IJ SOLN
INTRAMUSCULAR | Status: DC | PRN
  Administered 2017-10-18: 3000 [IU]

## 2017-10-18 MED ORDER — PHENYLEPHRINE 40 MCG/ML (10ML) SYRINGE FOR IV PUSH (FOR BLOOD PRESSURE SUPPORT)
PREFILLED_SYRINGE | INTRAVENOUS | Status: DC | PRN
  Administered 2017-10-18 (×4): 80 ug via INTRAVENOUS

## 2017-10-18 MED ORDER — FENTANYL CITRATE (PF) 100 MCG/2ML IJ SOLN: 25.0000 ug | INTRAMUSCULAR | Status: DC | PRN

## 2017-10-18 MED ORDER — PROPOFOL 10 MG/ML IV BOLUS
INTRAVENOUS | Status: DC | PRN
  Administered 2017-10-18: 30 mg via INTRAVENOUS
  Administered 2017-10-18: 20 mg via INTRAVENOUS

## 2017-10-18 MED ORDER — SODIUM CHLORIDE 0.9 % IV SOLN
INTRAVENOUS | Status: AC
  Filled 2017-10-18: qty 1.2

## 2017-10-18 MED ORDER — LIDOCAINE-EPINEPHRINE (PF) 1 %-1:200000 IJ SOLN
INTRAMUSCULAR | Status: AC
  Filled 2017-10-18: qty 60

## 2017-10-18 MED ORDER — ONDANSETRON HCL 4 MG/2ML IJ SOLN
INTRAMUSCULAR | Status: AC
  Filled 2017-10-18: qty 2

## 2017-10-18 MED ORDER — FENTANYL CITRATE (PF) 250 MCG/5ML IJ SOLN
INTRAMUSCULAR | Status: AC
  Filled 2017-10-18: qty 5

## 2017-10-18 MED ORDER — HEPARIN SODIUM (PORCINE) 1000 UNIT/ML IJ SOLN
INTRAMUSCULAR | Status: AC
  Filled 2017-10-18: qty 1

## 2017-10-18 MED ORDER — SODIUM CHLORIDE 0.9 % IV SOLN
INTRAVENOUS | Status: DC | PRN
  Administered 2017-10-18: 40 ug/min via INTRAVENOUS

## 2017-10-18 MED ORDER — CHLORHEXIDINE GLUCONATE 4 % EX LIQD: 60.0000 mL | Freq: Once | CUTANEOUS | Status: DC

## 2017-10-18 MED ORDER — PROPOFOL 500 MG/50ML IV EMUL
INTRAVENOUS | Status: DC | PRN
  Administered 2017-10-18: 75 ug/kg/min via INTRAVENOUS

## 2017-10-18 MED ORDER — 0.9 % SODIUM CHLORIDE (POUR BTL) OPTIME
TOPICAL | Status: DC | PRN
  Administered 2017-10-18: 1000 mL

## 2017-10-18 MED ORDER — SODIUM CHLORIDE 0.9 % IV SOLN
INTRAVENOUS | Status: DC
  Administered 2017-10-18: 11:00:00 via INTRAVENOUS

## 2017-10-18 MED ORDER — EPHEDRINE SULFATE-NACL 50-0.9 MG/10ML-% IV SOSY
PREFILLED_SYRINGE | INTRAVENOUS | Status: DC | PRN
  Administered 2017-10-18 (×2): 5 mg via INTRAVENOUS

## 2017-10-18 MED ORDER — PROPOFOL 10 MG/ML IV BOLUS
INTRAVENOUS | Status: AC
  Filled 2017-10-18: qty 20

## 2017-10-18 MED ORDER — HEMOSTATIC AGENTS (NO CHARGE) OPTIME
TOPICAL | Status: DC | PRN
  Administered 2017-10-18: 1 via TOPICAL

## 2017-10-18 MED ORDER — EPHEDRINE 5 MG/ML INJ
INTRAVENOUS | Status: AC
  Filled 2017-10-18: qty 10

## 2017-10-18 MED ORDER — DEXAMETHASONE SODIUM PHOSPHATE 10 MG/ML IJ SOLN
INTRAMUSCULAR | Status: AC
  Filled 2017-10-18: qty 1

## 2017-10-18 MED ORDER — FENTANYL CITRATE (PF) 100 MCG/2ML IJ SOLN
INTRAMUSCULAR | Status: DC | PRN
  Administered 2017-10-18 (×2): 50 ug via INTRAVENOUS

## 2017-10-18 MED ORDER — PHENYLEPHRINE 40 MCG/ML (10ML) SYRINGE FOR IV PUSH (FOR BLOOD PRESSURE SUPPORT)
PREFILLED_SYRINGE | INTRAVENOUS | Status: AC
  Filled 2017-10-18: qty 10

## 2017-10-18 MED ORDER — VANCOMYCIN HCL IN DEXTROSE 1-5 GM/200ML-% IV SOLN
1000.0000 mg | INTRAVENOUS | Status: AC
  Administered 2017-10-18: 1000 mg via INTRAVENOUS

## 2017-10-18 MED ORDER — DEXAMETHASONE SODIUM PHOSPHATE 4 MG/ML IJ SOLN
INTRAMUSCULAR | Status: DC | PRN
  Administered 2017-10-18: 6 mg via INTRAVENOUS

## 2017-10-18 MED ORDER — LIDOCAINE-EPINEPHRINE (PF) 1 %-1:200000 IJ SOLN
INTRAMUSCULAR | Status: DC | PRN
  Administered 2017-10-18: 28 mL

## 2017-10-18 MED ORDER — PROMETHAZINE HCL 25 MG/ML IJ SOLN: 6.2500 mg | INTRAMUSCULAR | Status: DC | PRN

## 2017-10-18 SURGICAL SUPPLY — 63 items
ADH SKN CLS APL DERMABOND .7 (GAUZE/BANDAGES/DRESSINGS) ×4
ARMBAND PINK RESTRICT EXTREMIT (MISCELLANEOUS) ×6 IMPLANT
BAG DECANTER FOR FLEXI CONT (MISCELLANEOUS) ×3 IMPLANT
BIOPATCH RED 1 DISK 7.0 (GAUZE/BANDAGES/DRESSINGS) ×3 IMPLANT
CANISTER SUCT 3000ML PPV (MISCELLANEOUS) ×3 IMPLANT
CATH PALINDROME RT-P 15FX19CM (CATHETERS) ×1 IMPLANT
CATH PALINDROME RT-P 15FX23CM (CATHETERS) IMPLANT
CATH PALINDROME RT-P 15FX28CM (CATHETERS) IMPLANT
CATH PALINDROME RT-P 15FX55CM (CATHETERS) IMPLANT
CLIP VESOCCLUDE MED 6/CT (CLIP) ×3 IMPLANT
CLIP VESOCCLUDE SM WIDE 6/CT (CLIP) ×3 IMPLANT
COVER PROBE W GEL 5X96 (DRAPES) ×3 IMPLANT
COVER SURGICAL LIGHT HANDLE (MISCELLANEOUS) ×3 IMPLANT
COVER WAND RF STERILE (DRAPES) ×3 IMPLANT
DERMABOND ADVANCED (GAUZE/BANDAGES/DRESSINGS) ×2
DERMABOND ADVANCED .7 DNX12 (GAUZE/BANDAGES/DRESSINGS) ×2 IMPLANT
DRAPE C-ARM 42X72 X-RAY (DRAPES) ×3 IMPLANT
DRAPE CHEST BREAST 15X10 FENES (DRAPES) ×3 IMPLANT
DRAPE ORTHO SPLIT 77X108 STRL (DRAPES) ×3
DRAPE SURG ORHT 6 SPLT 77X108 (DRAPES) IMPLANT
DRSG COVADERM 4X6 (GAUZE/BANDAGES/DRESSINGS) ×1 IMPLANT
ELECT REM PT RETURN 9FT ADLT (ELECTROSURGICAL) ×3
ELECTRODE REM PT RTRN 9FT ADLT (ELECTROSURGICAL) ×2 IMPLANT
GAUZE 4X4 16PLY RFD (DISPOSABLE) ×3 IMPLANT
GAUZE SPONGE 4X4 12PLY STRL LF (GAUZE/BANDAGES/DRESSINGS) ×1 IMPLANT
GLOVE BIO SURGEON STRL SZ7.5 (GLOVE) ×3 IMPLANT
GOWN STRL REUS W/ TWL LRG LVL3 (GOWN DISPOSABLE) ×4 IMPLANT
GOWN STRL REUS W/ TWL XL LVL3 (GOWN DISPOSABLE) ×2 IMPLANT
GOWN STRL REUS W/TWL LRG LVL3 (GOWN DISPOSABLE) ×6
GOWN STRL REUS W/TWL XL LVL3 (GOWN DISPOSABLE) ×3
GRAFT GORETEX STRT 4-7X45 (Vascular Products) ×1 IMPLANT
HEMOSTAT SNOW SURGICEL 2X4 (HEMOSTASIS) ×1 IMPLANT
INSERT FOGARTY SM (MISCELLANEOUS) ×4 IMPLANT
KIT BASIN OR (CUSTOM PROCEDURE TRAY) ×3 IMPLANT
KIT TURNOVER KIT B (KITS) ×3 IMPLANT
NDL 18GX1X1/2 (RX/OR ONLY) (NEEDLE) ×2 IMPLANT
NDL HYPO 25GX1X1/2 BEV (NEEDLE) ×2 IMPLANT
NEEDLE 18GX1X1/2 (RX/OR ONLY) (NEEDLE) ×3 IMPLANT
NEEDLE HYPO 25GX1X1/2 BEV (NEEDLE) ×3 IMPLANT
NS IRRIG 1000ML POUR BTL (IV SOLUTION) ×3 IMPLANT
PACK CV ACCESS (CUSTOM PROCEDURE TRAY) ×3 IMPLANT
PACK SURGICAL SETUP 50X90 (CUSTOM PROCEDURE TRAY) ×3 IMPLANT
PAD ARMBOARD 7.5X6 YLW CONV (MISCELLANEOUS) ×6 IMPLANT
SOAP 2 % CHG 4 OZ (WOUND CARE) ×3 IMPLANT
SUT ETHILON 3 0 PS 1 (SUTURE) ×3 IMPLANT
SUT GORETEX 6.0 TH-9 30 IN (SUTURE) IMPLANT
SUT GORETEX CV-6TTC-13 36IN (SUTURE) IMPLANT
SUT MNCRL AB 4-0 PS2 18 (SUTURE) ×3 IMPLANT
SUT PROLENE 5 0 C 1 24 (SUTURE) ×3 IMPLANT
SUT PROLENE 6 0 BV (SUTURE) IMPLANT
SUT SILK 2 0 SH (SUTURE) IMPLANT
SUT VIC AB 3-0 SH 27 (SUTURE) ×6
SUT VIC AB 3-0 SH 27X BRD (SUTURE) ×4 IMPLANT
SYR 10ML LL (SYRINGE) ×3 IMPLANT
SYR 20CC LL (SYRINGE) ×6 IMPLANT
SYR 5ML LL (SYRINGE) ×3 IMPLANT
SYR CONTROL 10ML LL (SYRINGE) ×3 IMPLANT
SYR TOOMEY 50ML (SYRINGE) IMPLANT
TOWEL GREEN STERILE (TOWEL DISPOSABLE) ×3 IMPLANT
TOWEL GREEN STERILE FF (TOWEL DISPOSABLE) ×3 IMPLANT
TOWEL OR 17X26 4PK STRL BLUE (TOWEL DISPOSABLE) ×3 IMPLANT
UNDERPAD 30X30 (UNDERPADS AND DIAPERS) ×3 IMPLANT
WATER STERILE IRR 1000ML POUR (IV SOLUTION) ×3 IMPLANT

## 2017-10-18 NOTE — Anesthesia Procedure Notes (Signed)
Procedure Name: MAC Date/Time: 10/18/2017 2:58 PM Performed by: Orlie Dakin, CRNA Pre-anesthesia Checklist: Patient identified, Emergency Drugs available, Suction available and Patient being monitored Patient Re-evaluated:Patient Re-evaluated prior to induction Oxygen Delivery Method: Simple face mask Preoxygenation: Pre-oxygenation with 100% oxygen Induction Type: IV induction

## 2017-10-18 NOTE — Op Note (Signed)
Patient name: Miguel Tucker MRN: [id number] DOB: [birth year] Sex: male  10/18/2017 Pre-operative Diagnosis: End-stage renal disease Post-operative diagnosis:  Same Surgeon:  Erlene Quan C. Donzetta Matters, MD Procedure Performed: 1.  Right IJ tunneled dialysis catheter with ultrasound guidance 2.  Left arm brachial axillary 4-7 mm AV graft   Indications: 84 year old male with end-stage renal disease.  He has a previously failed basilic vein fistula on the left.  He is now indicated for permanent access as well as tunneled catheter for access in the interim.  Findings: 19 cm catheter was placed under ultrasound guidance and fluoroscopic guidance into the SVC-atrial junction.  Axillary vein was identified with ultrasound quite high in the axilla and a 4-7 mm AV graft was placed with end-to-end configuration on the venous limb.   Procedure:  The patient was identified in the holding area and taken to the operating room where is placed supine the operative table and MAC anesthesia was induced.  He was sterilely prepped and draped in the bilateral neck chest and left upper extremity.  Antibiotics were administered and timeout was called.  We began with ultrasound-guided evaluation of his right IJ which was noted to be patent and compressible.  The area of his right neck and chest was anesthetized 1% lidocaine with epinephrine.  We then cannulated the internal jugular vein with direct ultrasound guidance with 18-gauge needle placed the wire centrally.  A 19 cm catheter was tunneled.  The wire tract was serially dilated and introducer sheath was placed under fluoroscopic guidance.  Catheter was then placed to the SVC-atrial junction.  It was assembled and flushed with heparinized saline and affixed the skin with 3-0 nylon suture.  Neck incision was closed with 4-0 Monocryl.  Dermabond was placed at both sites.  Catheter was locked with 1.5 cc of concentrated heparin in both ports.  Sterile dressing was placed.   Next  We then turned our attention left upper extremity.  Ultrasound was used to identify first his brachial artery above the antecubital which was noted to be approximately 5 mm in diameter with free of disease.  The axillary vein was quite sclerotic open to the level of the axilla where it was then large and compressible with a large branch coming into it.  Entire left upper extremity was then anesthetized at the 2 incision sites as well as the tunneling site with 1% lidocaine with epinephrine.  An incision was made in the axilla we dissected down identified the axillary vein.  Unfortunately was quite friable and sclerotic and was injured.  We had to then clamp it proximally distally ended up transecting and oversewed the inflow side of it with 5-0 Prolene suture in a mattress configuration.  Distally it did appear healthy had good backbleeding we flushed this with heparinized saline.  We made a separate longitudinal incision above the antecubitum dissected down placed a vessel loop around the healthy-appearing brachial artery.  A 4-7 mm graft was then tunneled.  We specially the 7 mm end and sewn end-to-end to the vein with 5-0 Prolene suture.  Upon completion we then flushed through the graft with heparinized saline and clamped the graft.  The graft was then trimmed to size with a slight bevel at the 4 mm and.  The artery was clamped distally proximally opened longitudinally flushed with heparinized saline both directions.  The graft was then sewn end-to-side with 6-0 Prolene suture.  Prior to completion anastomosis we allowed flushing all directions.  Upon completion there was  a strong thrill in the vein confirmed with Doppler however radial pulse the wrist.  Satisfied with this we obtained hemostasis of both wounds closed in layers with Vicryl and Monocryl.  He was then allowed away from anesthesia having tolerated procedure without immediate complication.  All counts were correct at completion.  Next  EBL  200 cc.    Kaeleb Emond C. Donzetta Matters, MD [facility] of [place] Office: [phone] Pager: [phone]

## 2017-10-18 NOTE — Discharge Instructions (Signed)
° °  Vascular and Vein Specialists of [place] ° °Discharge Instructions ° °AV Fistula or Graft Surgery for Dialysis Access ° °Please refer to the following instructions for your post-procedure care. Your surgeon or physician assistant will discuss any changes with you. ° °Activity ° °You may drive the day following your surgery, if you are comfortable and no longer taking prescription pain medication. Resume full activity as the soreness in your incision resolves. ° °Bathing/Showering ° °You may shower after you go home. Keep your incision dry for 48 hours. Do not soak in a bathtub, hot tub, or swim until the incision heals completely. You may not shower if you have a hemodialysis catheter. ° °Incision Care ° °Clean your incision with mild soap and water after 48 hours. Pat the area dry with a clean towel. You do not need a bandage unless otherwise instructed. Do not apply any ointments or creams to your incision. You may have skin glue on your incision. Do not peel it off. It will come off on its own in about one week. Your arm may swell a bit after surgery. To reduce swelling use pillows to elevate your arm so it is above your heart. Your doctor will tell you if you need to lightly wrap your arm with an ACE bandage. ° °Diet ° °Resume your normal diet. There are not special food restrictions following this procedure. In order to heal from your surgery, it is CRITICAL to get adequate nutrition. Your body requires vitamins, minerals, and protein. Vegetables are the best source of vitamins and minerals. Vegetables also provide the perfect balance of protein. Processed food has little nutritional value, so try to avoid this. ° °Medications ° °Resume taking all of your medications. If your incision is causing pain, you may take over-the counter pain relievers such as acetaminophen (Tylenol). If you were prescribed a stronger pain medication, please be aware these medications can cause nausea and constipation. Prevent  nausea by taking the medication with a snack or meal. Avoid constipation by drinking plenty of fluids and eating foods with high amount of fiber, such as fruits, vegetables, and grains. Do not take Tylenol if you are taking prescription pain medications. ° ° ° ° °Follow up °Your surgeon may want to see you in the office following your access surgery. If so, this will be arranged at the time of your surgery. ° °Please call us immediately for any of the following conditions: ° °Increased pain, redness, drainage (pus) from your incision site °Fever of 101 degrees or higher °Severe or worsening pain at your incision site °Hand pain or numbness. ° °Reduce your risk of vascular disease: ° °Stop smoking. If you would like help, call [facility] at 1-800-QUIT-NOW (1-800-784-8669) or [facility] at 336-586-4000 ° °Manage your cholesterol °Maintain a desired weight °Control your diabetes °Keep your blood pressure down ° °Dialysis ° °It will take several weeks to several months for your new dialysis access to be ready for use. Your surgeon will determine when it is OK to use it. Your nephrologist will continue to direct your dialysis. You can continue to use your Permcath until your new access is ready for use. ° °If you have any questions, please call the office at [phone]. ° °

## 2017-10-18 NOTE — Transfer of Care (Signed)
Immediate Anesthesia Transfer of Care Note  Patient: Miguel Tucker  Procedure(s) Performed: INSERTION OF ARTERIOVENOUS (AV) GORE-TEX GRAFT LEFT UPPER  ARM (Left Arm Upper) INSERTION OF TUNNELED  DIALYSIS CATHETER RIGHT INTERNAL JUGULAR (Right )  Patient Location: PACU  Anesthesia Type:MAC  Level of Consciousness: awake and patient cooperative  Airway & Oxygen Therapy: Patient Spontanous Breathing and Patient connected to face mask oxygen  Post-op Assessment: Report given to RN and Post -op Vital signs reviewed and stable  Post vital signs: Reviewed and stable  Last Vitals:  Vitals Value Taken Time  BP 120/75 10/18/2017  4:33 PM  Temp    Pulse 67 10/18/2017  4:35 PM  Resp 17 10/18/2017  4:35 PM  SpO2 100 % 10/18/2017  4:35 PM  Vitals shown include unvalidated device data.  Last Pain:  Vitals:   10/18/17 1049  TempSrc: Oral         Complications: No apparent anesthesia complications

## 2017-10-18 NOTE — Anesthesia Postprocedure Evaluation (Signed)
Anesthesia Post Note  Patient: Miguel Tucker  Procedure(s) Performed: INSERTION OF ARTERIOVENOUS (AV) GORE-TEX GRAFT LEFT UPPER  ARM (Left Arm Upper) INSERTION OF TUNNELED  DIALYSIS CATHETER RIGHT INTERNAL JUGULAR (Right )     Patient location during evaluation: PACU Anesthesia Type: MAC Level of consciousness: awake and alert Pain management: pain level controlled Vital Signs Assessment: post-procedure vital signs reviewed and stable Respiratory status: spontaneous breathing, nonlabored ventilation, respiratory function stable and patient connected to nasal cannula oxygen Cardiovascular status: stable and blood pressure returned to baseline Postop Assessment: no apparent nausea or vomiting Anesthetic complications: no    Last Vitals:  Vitals:   10/18/17 1710 10/18/17 1715  BP:    Pulse: 68 68  Resp: 13 16  Temp:  36.5 C  SpO2: 94% 99%    Last Pain:  Vitals:   10/18/17 1049  TempSrc: Oral                 Barnet Glasgow

## 2017-10-18 NOTE — H&P (Signed)
   History and Physical Update  The patient was interviewed and re-examined.  The patient's previous History and Physical has been reviewed and is unchanged Dr. Luther Parody recent office visit.  Plan is for left arm AV graft and tunneled dialysis catheter.  I discussed this with the family and they agreed to proceed.  Helen Cuff C. Donzetta Matters, MD [facility] Office: [phone] Pager: [phone]  10/18/2017, 11:44 AM

## 2017-10-18 NOTE — Anesthesia Preprocedure Evaluation (Addendum)
Anesthesia Evaluation  Patient identified by MRN, date of birth, ID band Patient awake    Reviewed: Allergy & Precautions, NPO status , Patient's Chart, lab work & pertinent test results, reviewed documented beta blocker date and time   History of Anesthesia Complications Negative for: history of anesthetic complications  Airway Mallampati: II  TM Distance: >3 FB Neck ROM: Full    Dental  (+) Partial Upper, Dental Advisory Given   Pulmonary asthma , former smoker,    Pulmonary exam normal breath sounds clear to auscultation       Cardiovascular hypertension, Pt. on medications and Pt. on home beta blockers Normal cardiovascular exam Rhythm:Regular Rate:Normal     Neuro/Psych PSYCHIATRIC DISORDERS Dementia negative neurological ROS     GI/Hepatic Neg liver ROS,   Endo/Other  diabetes, Well Controlled, Type 2Gout  Renal/GU Dialysis and ESRFRenal disease  negative genitourinary   Musculoskeletal negative musculoskeletal ROS (+)   Abdominal Normal abdominal exam  (+)   Peds  Hematology negative hematology ROS (+)   Anesthesia Other Findings   Reproductive/Obstetrics                            Anesthesia Physical  Anesthesia Plan  ASA: III  Anesthesia Plan: General   Post-op Pain Management:    Induction: Intravenous  PONV Risk Score and Plan: 2 and Ondansetron and Propofol infusion  Airway Management Planned: Natural Airway and Simple Face Mask  Additional Equipment:   Intra-op Plan:   Post-operative Plan:   Informed Consent: I have reviewed the patients History and Physical, chart, labs and discussed the procedure including the risks, benefits and alternatives for the proposed anesthesia with the patient or authorized representative who has indicated his/her understanding and acceptance.   Dental advisory given  Plan Discussed with: CRNA, Anesthesiologist and  Surgeon  Anesthesia Plan Comments:         Anesthesia Quick Evaluation

## 2017-10-19 ENCOUNTER — Encounter (HOSPITAL_COMMUNITY): Payer: Self-pay | Admitting: Vascular Surgery

## 2017-10-20 DIAGNOSIS — I120 Hypertensive chronic kidney disease with stage 5 chronic kidney disease or end stage renal disease: Secondary | ICD-10-CM | POA: Diagnosis not present

## 2017-10-20 DIAGNOSIS — J45909 Unspecified asthma, uncomplicated: Secondary | ICD-10-CM | POA: Diagnosis not present

## 2017-10-20 DIAGNOSIS — M6281 Muscle weakness (generalized): Secondary | ICD-10-CM | POA: Diagnosis not present

## 2017-10-20 DIAGNOSIS — E1122 Type 2 diabetes mellitus with diabetic chronic kidney disease: Secondary | ICD-10-CM | POA: Diagnosis not present

## 2017-10-20 DIAGNOSIS — D631 Anemia in chronic kidney disease: Secondary | ICD-10-CM | POA: Diagnosis not present

## 2017-10-20 DIAGNOSIS — E785 Hyperlipidemia, unspecified: Secondary | ICD-10-CM | POA: Diagnosis not present

## 2017-10-20 DIAGNOSIS — M109 Gout, unspecified: Secondary | ICD-10-CM | POA: Diagnosis not present

## 2017-10-20 DIAGNOSIS — G8929 Other chronic pain: Secondary | ICD-10-CM | POA: Diagnosis not present

## 2017-10-20 DIAGNOSIS — R69 Illness, unspecified: Secondary | ICD-10-CM | POA: Diagnosis not present

## 2017-10-20 DIAGNOSIS — N185 Chronic kidney disease, stage 5: Secondary | ICD-10-CM | POA: Diagnosis not present

## 2017-10-21 DIAGNOSIS — N2581 Secondary hyperparathyroidism of renal origin: Secondary | ICD-10-CM | POA: Diagnosis not present

## 2017-10-21 DIAGNOSIS — E1159 Type 2 diabetes mellitus with other circulatory complications: Secondary | ICD-10-CM | POA: Diagnosis not present

## 2017-10-21 DIAGNOSIS — Z0000 Encounter for general adult medical examination without abnormal findings: Secondary | ICD-10-CM | POA: Diagnosis not present

## 2017-10-21 DIAGNOSIS — I1311 Hypertensive heart and chronic kidney disease without heart failure, with stage 5 chronic kidney disease, or end stage renal disease: Secondary | ICD-10-CM | POA: Diagnosis not present

## 2017-10-21 DIAGNOSIS — E1129 Type 2 diabetes mellitus with other diabetic kidney complication: Secondary | ICD-10-CM | POA: Diagnosis not present

## 2017-10-21 DIAGNOSIS — D631 Anemia in chronic kidney disease: Secondary | ICD-10-CM | POA: Diagnosis not present

## 2017-10-21 DIAGNOSIS — N186 End stage renal disease: Secondary | ICD-10-CM | POA: Diagnosis not present

## 2017-10-21 DIAGNOSIS — R808 Other proteinuria: Secondary | ICD-10-CM | POA: Diagnosis not present

## 2017-10-21 DIAGNOSIS — I10 Essential (primary) hypertension: Secondary | ICD-10-CM | POA: Diagnosis not present

## 2017-10-21 DIAGNOSIS — E1165 Type 2 diabetes mellitus with hyperglycemia: Secondary | ICD-10-CM | POA: Diagnosis not present

## 2017-10-24 ENCOUNTER — Encounter (HOSPITAL_COMMUNITY)
Admission: RE | Admit: 2017-10-24 | Discharge: 2017-10-24 | Disposition: A | Discharge status: Home / Self Care | Payer: Medicare HMO | Source: Ambulatory Visit | Attending: Nephrology | Admitting: Nephrology

## 2017-10-24 ENCOUNTER — Encounter (HOSPITAL_COMMUNITY): Payer: Medicare HMO

## 2017-10-24 DIAGNOSIS — Z23 Encounter for immunization: Secondary | ICD-10-CM | POA: Diagnosis not present

## 2017-10-24 DIAGNOSIS — D689 Coagulation defect, unspecified: Secondary | ICD-10-CM | POA: Diagnosis not present

## 2017-10-24 DIAGNOSIS — D631 Anemia in chronic kidney disease: Secondary | ICD-10-CM | POA: Diagnosis not present

## 2017-10-24 DIAGNOSIS — Z992 Dependence on renal dialysis: Secondary | ICD-10-CM | POA: Diagnosis not present

## 2017-10-24 DIAGNOSIS — N184 Chronic kidney disease, stage 4 (severe): Secondary | ICD-10-CM | POA: Insufficient documentation

## 2017-10-24 DIAGNOSIS — E039 Hypothyroidism, unspecified: Secondary | ICD-10-CM | POA: Diagnosis not present

## 2017-10-24 DIAGNOSIS — N2581 Secondary hyperparathyroidism of renal origin: Secondary | ICD-10-CM | POA: Diagnosis not present

## 2017-10-24 DIAGNOSIS — E1129 Type 2 diabetes mellitus with other diabetic kidney complication: Secondary | ICD-10-CM | POA: Diagnosis not present

## 2017-10-24 DIAGNOSIS — N186 End stage renal disease: Secondary | ICD-10-CM | POA: Diagnosis not present

## 2017-10-26 DIAGNOSIS — D631 Anemia in chronic kidney disease: Secondary | ICD-10-CM | POA: Diagnosis not present

## 2017-10-26 DIAGNOSIS — N2581 Secondary hyperparathyroidism of renal origin: Secondary | ICD-10-CM | POA: Diagnosis not present

## 2017-10-26 DIAGNOSIS — E1129 Type 2 diabetes mellitus with other diabetic kidney complication: Secondary | ICD-10-CM | POA: Diagnosis not present

## 2017-10-26 DIAGNOSIS — D689 Coagulation defect, unspecified: Secondary | ICD-10-CM | POA: Diagnosis not present

## 2017-10-26 DIAGNOSIS — E039 Hypothyroidism, unspecified: Secondary | ICD-10-CM | POA: Diagnosis not present

## 2017-10-26 DIAGNOSIS — N186 End stage renal disease: Secondary | ICD-10-CM | POA: Diagnosis not present

## 2017-10-26 DIAGNOSIS — Z23 Encounter for immunization: Secondary | ICD-10-CM | POA: Diagnosis not present

## 2017-10-28 DIAGNOSIS — N186 End stage renal disease: Secondary | ICD-10-CM | POA: Diagnosis not present

## 2017-10-28 DIAGNOSIS — E039 Hypothyroidism, unspecified: Secondary | ICD-10-CM | POA: Diagnosis not present

## 2017-10-28 DIAGNOSIS — E1129 Type 2 diabetes mellitus with other diabetic kidney complication: Secondary | ICD-10-CM | POA: Diagnosis not present

## 2017-10-28 DIAGNOSIS — D689 Coagulation defect, unspecified: Secondary | ICD-10-CM | POA: Diagnosis not present

## 2017-10-28 DIAGNOSIS — N2581 Secondary hyperparathyroidism of renal origin: Secondary | ICD-10-CM | POA: Diagnosis not present

## 2017-10-28 DIAGNOSIS — D631 Anemia in chronic kidney disease: Secondary | ICD-10-CM | POA: Diagnosis not present

## 2017-10-28 DIAGNOSIS — Z23 Encounter for immunization: Secondary | ICD-10-CM | POA: Diagnosis not present

## 2017-10-31 DIAGNOSIS — E039 Hypothyroidism, unspecified: Secondary | ICD-10-CM | POA: Diagnosis not present

## 2017-10-31 DIAGNOSIS — D689 Coagulation defect, unspecified: Secondary | ICD-10-CM | POA: Diagnosis not present

## 2017-10-31 DIAGNOSIS — N2581 Secondary hyperparathyroidism of renal origin: Secondary | ICD-10-CM | POA: Diagnosis not present

## 2017-10-31 DIAGNOSIS — E1129 Type 2 diabetes mellitus with other diabetic kidney complication: Secondary | ICD-10-CM | POA: Diagnosis not present

## 2017-10-31 DIAGNOSIS — N186 End stage renal disease: Secondary | ICD-10-CM | POA: Diagnosis not present

## 2017-10-31 DIAGNOSIS — D631 Anemia in chronic kidney disease: Secondary | ICD-10-CM | POA: Diagnosis not present

## 2017-10-31 DIAGNOSIS — Z23 Encounter for immunization: Secondary | ICD-10-CM | POA: Diagnosis not present

## 2017-11-01 ENCOUNTER — Encounter: Payer: Self-pay | Admitting: Adult Health

## 2017-11-01 ENCOUNTER — Ambulatory Visit: Payer: Medicare HMO | Admitting: Adult Health

## 2017-11-01 VITALS — BP 133/73 | HR 74 | Ht 66.0 in | Wt 158.6 lb

## 2017-11-01 DIAGNOSIS — R69 Illness, unspecified: Secondary | ICD-10-CM | POA: Diagnosis not present

## 2017-11-01 DIAGNOSIS — F0390 Unspecified dementia without behavioral disturbance: Secondary | ICD-10-CM | POA: Diagnosis not present

## 2017-11-01 MED ORDER — DONEPEZIL HCL 5 MG PO TABS: 5.0000 mg | ORAL_TABLET | Freq: Every day | ORAL | 11 refills | Status: DC

## 2017-11-01 NOTE — Patient Instructions (Signed)
Your Plan:  Memory score has declined Start Aricept 5 mg at bedtime If your symptoms worsen or you develop new symptoms please let us know.    Thank you for coming to see [place] at [facility]. I hope we have been able to provide you high quality care today.  You may receive a patient satisfaction survey over the next few weeks. We would appreciate your feedback and comments so that we may continue to improve ourselves and the health of our patients.  Donepezil tablets What is this medicine? DONEPEZIL (doe NEP e zil) is used to treat mild to moderate dementia caused by Alzheimer's disease. This medicine may be used for other purposes; ask your health care provider or pharmacist if you have questions. COMMON BRAND NAME(S): Aricept What should I tell my health care provider before I take this medicine? They need to know if you have any of these conditions: -asthma or other lung disease -difficulty passing urine -head injury -heart disease -history of irregular heartbeat -liver disease -seizures (convulsions) -stomach or intestinal disease, ulcers or stomach bleeding -an unusual or allergic reaction to donepezil, other medicines, foods, dyes, or preservatives -pregnant or trying to get pregnant -breast-feeding How should I use this medicine? Take this medicine by mouth with a glass of water. Follow the directions on the prescription label. You may take this medicine with or without food. Take this medicine at regular intervals. This medicine is usually taken before bedtime. Do not take it more often than directed. Continue to take your medicine even if you feel better. Do not stop taking except on your doctor's advice. If you are taking the 23 mg donepezil tablet, swallow it whole; do not cut, crush, or chew it. Talk to your pediatrician regarding the use of this medicine in children. Special care may be needed. Overdosage: If you think you have taken too much of this  medicine contact a poison control center or emergency room at once. NOTE: This medicine is only for you. Do not share this medicine with others. What if I miss a dose? If you miss a dose, take it as soon as you can. If it is almost time for your next dose, take only that dose, do not take double or extra doses. What may interact with this medicine? Do not take this medicine with any of the following medications: -certain medicines for fungal infections like itraconazole, fluconazole, posaconazole, and voriconazole -cisapride -dextromethorphan; quinidine -dofetilide -dronedarone -pimozide -quinidine -thioridazine -ziprasidone This medicine may also interact with the following medications: -antihistamines for allergy, cough and cold -atropine -bethanechol -carbamazepine -certain medicines for bladder problems like oxybutynin, tolterodine -certain medicines for Parkinson's disease like benztropine, trihexyphenidyl -certain medicines for stomach problems like dicyclomine, hyoscyamine -certain medicines for travel sickness like scopolamine -dexamethasone -ipratropium -NSAIDs, medicines for pain and inflammation, like ibuprofen or naproxen -other medicines for Alzheimer's disease -other medicines that prolong the QT interval (cause an abnormal heart rhythm) -phenobarbital -phenytoin -rifampin, rifabutin or rifapentine This list may not describe all possible interactions. Give your health care provider a list of all the medicines, herbs, non-prescription drugs, or dietary supplements you use. Also tell them if you smoke, drink alcohol, or use illegal drugs. Some items may interact with your medicine. What should I watch for while using this medicine? Visit your doctor or health care professional for regular checks on your progress. Check with your doctor or health care professional if your symptoms do not get better or if they get worse. You may  get drowsy or dizzy. Do not drive, use  machinery, or do anything that needs mental alertness until you know how this drug affects you. What side effects may I notice from receiving this medicine? Side effects that you should report to your doctor or health care professional as soon as possible: -allergic reactions like skin rash, itching or hives, swelling of the face, lips, or tongue -feeling faint or lightheaded, falls -loss of bladder control -seizures -signs and symptoms of a dangerous change in heartbeat or heart rhythm like chest pain; dizziness; fast or irregular heartbeat; palpitations; feeling faint or lightheaded, falls; breathing problems -signs and symptoms of infection like fever or chills; cough; sore throat; pain or trouble passing urine -signs and symptoms of liver injury like dark yellow or brown urine; general ill feeling or flu-like symptoms; light-colored stools; loss of appetite; nausea; right upper belly pain; unusually weak or tired; yellowing of the eyes or skin -slow heartbeat or palpitations -unusual bleeding or bruising -vomiting Side effects that usually do not require medical attention (report to your doctor or health care professional if they continue or are bothersome): -diarrhea, especially when starting treatment -headache -loss of appetite -muscle cramps -nausea -stomach upset This list may not describe all possible side effects. Call your doctor for medical advice about side effects. You may report side effects to FDA at 1-800-FDA-1088. Where should I keep my medicine? Keep out of reach of children. Store at room temperature between 15 and 30 degrees C (59 and 86 degrees F). Throw away any unused medicine after the expiration date. NOTE: This sheet is a summary. It may not cover all possible information. If you have questions about this medicine, talk to your doctor, pharmacist, or health care provider.  2018 Elsevier/Gold Standard (2015-06-09 21:00:42)

## 2017-11-01 NOTE — Progress Notes (Signed)
I have read the note, and I agree with the clinical assessment and plan.  Jasenia Weilbacher K Merida Alcantar   

## 2017-11-01 NOTE — Progress Notes (Signed)
PATIENT: Miguel Tucker DOB: 04-Apr-1930  REASON FOR VISIT: follow up HISTORY FROM: patient  HISTORY OF PRESENT ILLNESS: Today 11/01/17:  [name] is an 84 year old male with a history of memory disturbance.  He returns today for follow-up.  He just recently started hemodialysis.  He is here today with his wife and son.  He does have a been any change in his memory.  His son reports that his short-term memory has diminished and reports that he has no long-term memory.  He lives at home with his wife.  He is able to complete all ADLs independently but does require prompting to reports good appetite.  Denies any trouble sleeping.  Denies any changes in mood or behavior.  Denies hallucinations.  He does not operate a motor vehicle. the son reports that his mother has also been diagnosed with dementia.  Their primary care has recommended that they should not live independently.  He returns today for evaluation.  HISTORY [name] is an 84 year old right-handed black male with a history of hypertension and diabetes.  The patient has developed end-stage renal disease, he is about ready to start hemodialysis at any time.  His most recent BUN and creatinine are 46/6.17.  Over the last 2 years the patient has apparently had some troubles with memory.  Unfortunately, he comes to the office today by himself.  The patient lives with his wife.  He has not noted that he has a memory problem mainly because he has a tendency to misplace things about the house.  He is trying to get more organized to avoid this issue.  He still operates a Teacher, music, he denies any issues with safety with driving or difficulty with getting lost.  The patient has had some problems with managing finances, his son lives in [place] and will come down to visit off and on and check the finances.  He has made mistakes paying the bills.  His son has tried to set him up with electronic billing so that the bills are paid  automatically.  The patient is able to keep up with his medications and appointments fairly well.  The patient does not cook.  He denies any difficulty with numbness or weakness, he does have arthritis of the right knee and will limp a bit when he tries to walk.  He denies issues controlling the bowels or the bladder.  He claims that he sleeps well at night and has a good energy level during the daytime.  Given the memory problems, he is sent to this office for an evaluation.   REVIEW OF SYSTEMS: Out of a complete 14 system review of symptoms, the patient complains only of the following symptoms, and all other reviewed systems are negative.  Joint pain, memory loss  ALLERGIES: Allergies  Allergen Reactions  . Penicillins Itching and Other (See Comments)    On arms only. Has patient had a PCN reaction causing immediate rash, facial/tongue/throat swelling, SOB or lightheadedness with hypotension: No Has patient had a PCN reaction causing severe rash involving mucus membranes or skin necrosis: No Has patient had a PCN reaction that required hospitalization No Has patient had a PCN reaction occurring within the last 10 years: No If all of the above answers are "NO", then may proceed with Cephalosporin use.     HOME MEDICATIONS: Outpatient Medications Prior to Visit  Medication Sig Dispense Refill  . allopurinol (ZYLOPRIM) 100 MG tablet Take 100 mg by mouth daily.     [name]  amLODipine (NORVASC) 10 MG tablet Take 1 tablet (10 mg total) by mouth daily.    [name] aspirin EC 81 MG tablet Take 81 mg by mouth every morning.     . ferrous sulfate 325 (65 FE) MG tablet Take 325 mg by mouth daily with breakfast.    . finasteride (PROSCAR) 5 MG tablet Take 5 mg by mouth daily.    . furosemide (LASIX) 40 MG tablet Take 40 mg by mouth 2 (two) times daily.     [name] HYDROcodone-acetaminophen (NORCO) 5-325 MG tablet Take 1 tablet by mouth every 6 (six) hours as needed for moderate pain. 12 tablet 0  . labetalol  (NORMODYNE) 200 MG tablet Take 200 mg by mouth 2 (two) times daily.     . Misc. Devices (ADJUSTABLE ALUMINUM CANE) MISC Use the cane as needed for weight bearing 1 each 0  . montelukast (SINGULAIR) 10 MG tablet Take 10 mg by mouth at bedtime.    . valsartan (DIOVAN) 160 MG tablet Take 160 mg by mouth 2 (two) times daily.      No facility-administered medications prior to visit.     PAST MEDICAL HISTORY: Past Medical History:  Diagnosis Date  . Asthma   . Chronic kidney disease   . Chronic knee pain   . Dementia ([place])   . Dementia ([place])   . Diabetes mellitus    type 2  . Frequent urination at night   . Gout   . Hard of hearing   . High cholesterol   . Hypertension   . Hypokalemia   . Radicular pain of left lower extremity     PAST SURGICAL HISTORY: Past Surgical History:  Procedure Laterality Date  . APPENDECTOMY    . AV FISTULA PLACEMENT Left 10/18/2017   Procedure: INSERTION OF ARTERIOVENOUS (AV) GORE-TEX GRAFT LEFT UPPER  ARM;  Surgeon: Waynetta Sandy, MD;  Location: [facility];  Service: Vascular;  Laterality: Left;  . BASCILIC VEIN TRANSPOSITION Left 09/09/2015   Procedure: FIRST STAGE BRACHIAL VEIN TRANSPOSITION LEFT ARM;  Surgeon: Elam Dutch, MD;  Location: [place];  Service: Vascular;  Laterality: Left;  . BASCILIC VEIN TRANSPOSITION Left 02/20/2016   Procedure: LEFT ARM SECOND STAGE BASILIC VEIN TRANSPOSITION;  Surgeon: Rosetta Posner, MD;  Location: [facility];  Service: Vascular;  Laterality: Left;  . INSERTION OF DIALYSIS CATHETER Right 10/18/2017   Procedure: INSERTION OF TUNNELED  DIALYSIS CATHETER RIGHT INTERNAL JUGULAR;  Surgeon: Waynetta Sandy, MD;  Location: [place];  Service: Vascular;  Laterality: Right;  . OTHER SURGICAL HISTORY     testicular tumor- benign   . ROTATOR CUFF REPAIR Right   . WOUND DEBRIDEMENT  06/01/2011   Procedure: DEBRIDEMENT ABDOMINAL WOUND;  Surgeon: [name], MD;  Location: [facility];  Service: General;  Laterality: N/A;   Remove Sutures     FAMILY HISTORY: History reviewed. No pertinent family history.  SOCIAL HISTORY: Social History   Socioeconomic History  . Marital status: Married    Spouse name: Not on file  . Number of children: 3  . Years of education: 10  . Highest education level: Not on file  Occupational History  . Occupation: Retired  Scientific laboratory technician  . Financial resource strain: Not on file  . Food insecurity:    Worry: Not on file    Inability: Not on file  . Transportation needs:    Medical: Not on file    Non-medical: Not on file  Tobacco Use  .  Smoking status: Former Smoker    Last attempt to quit: 11/06/1975    Years since quitting: 42.0  . Smokeless tobacco: Never Used  Substance and Sexual Activity  . Alcohol use: No  . Drug use: No  . Sexual activity: Not on file  Lifestyle  . Physical activity:    Days per week: Not on file    Minutes per session: Not on file  . Stress: Not on file  Relationships  . Social connections:    Talks on phone: Not on file    Gets together: Not on file    Attends religious service: Not on file    Active member of club or organization: Not on file    Attends meetings of clubs or organizations: Not on file    Relationship status: Not on file  . Intimate partner violence:    Fear of current or ex partner: Not on file    Emotionally abused: Not on file    Physically abused: Not on file    Forced sexual activity: Not on file  Other Topics Concern  . Not on file  Social History Narrative   Lives with spouse   Caffeine use:  none   Right handed      PHYSICAL EXAM  Vitals:   11/01/17 1442  BP: 133/73  Pulse: 74  Weight: 158 lb 9.6 oz (71.9 kg)  Height: 5\' 6"  (1.676 m)   Body mass index is 25.6 kg/m.   MMSE - Mini Mental State Exam 11/01/2017 04/11/2017  Not completed: (No Data) -  Orientation to time 1 2  Orientation to Place 0 4  Registration 3 3  Attention/ Calculation 0 3  Recall 0 0  Language- name 2 objects 2 2    Language- repeat 1 1  Language- follow 3 step command 2 3  Language- read & follow direction 1 1  Write a sentence 1 1  Copy design 1 0  Total score 12 20     Generalized: Well developed, in no acute distress   Neurological examination  Mentation: Alert. Follows all commands speech and language fluent Cranial nerve II-XII: Pupils were equal round reactive to light. Extraocular movements were full, visual field were full on confrontational test. Facial sensation and strength were normal. Uvula tongue midline. Head turning and shoulder shrug  were normal and symmetric. Motor: The motor testing reveals 5 over 5 strength of all 4 extremities. Good symmetric motor tone is noted throughout.  Sensory: Sensory testing is intact to soft touch on all 4 extremities. No evidence of extinction is noted.  Coordination: Cerebellar testing reveals good finger-nose-finger and heel-to-shin bilaterally.  Gait and station: Patient uses a cane when ambulating. Reflexes: Deep tendon reflexes are symmetric and normal bilaterally.   DIAGNOSTIC DATA (LABS, IMAGING, TESTING) - I reviewed patient records, labs, notes, testing and imaging myself where available.  Lab Results  Component Value Date   WBC 5.7 09/13/2017   HGB 11.2 (L) 10/18/2017   HCT 33.0 (L) 10/18/2017   MCV 85.8 09/13/2017   PLT 211 09/13/2017      Component Value Date/Time   NA 141 10/18/2017 1044   K 4.3 10/18/2017 1044   CL 110 09/26/2017 1130   CO2 20 (L) 09/26/2017 1130   GLUCOSE 104 (H) 10/18/2017 1044   BUN 63 (H) 09/26/2017 1130   CREATININE 6.51 (H) 09/26/2017 1130   CALCIUM 9.5 09/26/2017 1130   CALCIUM 9.5 09/26/2017 1130   PROT 6.8 09/06/2017 1518  ALBUMIN 3.6 09/26/2017 1130   AST 13 (L) 09/06/2017 1518   ALT 13 09/06/2017 1518   ALKPHOS 75 09/06/2017 1518   BILITOT 0.8 09/06/2017 1518   GFRNONAA 7 (L) 09/26/2017 1130   GFRAA 8 (L) 09/26/2017 1130   Lab Results  Component Value Date   CHOL  02/16/2010    81         ATP III CLASSIFICATION:  <200     mg/dL   Desirable  200-239  mg/dL   Borderline High  >=240    mg/dL   High          TRIG 95 02/16/2010   Lab Results  Component Value Date   HGBA1C 5.9 (H) 09/06/2017   Lab Results  Component Value Date   VITAMINB12 306 04/11/2017       ASSESSMENT AND PLAN 84 y.o. year old male  has a past medical history of Asthma, Chronic kidney disease, Chronic knee pain, Dementia ([place]), Dementia ([place]), Diabetes mellitus, Frequent urination at night, Gout, Hard of hearing, High cholesterol, Hypertension, Hypokalemia, and Radicular pain of left lower extremity. here with:  1.  Memory disturbance  The patient's memory score has declined significant since the last visit.  This is been a progressive change.  The patient is currently not on any memory medication.  I will start low-dose Aricept 5 mg at bedtime.  I have reviewed potential side effects with the patient and his family.  Also provided him with a handout.  Also provided the patient a handout with resources for dementia patients.  This same information was emailed to the son at his request.  I have advised that if his symptoms worsen or he develop new symptoms he should let us know.  He will follow-up in 6 months or sooner if needed.   I spent 15 minutes with the patient. 50% of this time was spent   Ward Givens, MSN, NP-C 11/01/2017, 3:00 PM [facility] [address], [place], [place] [address] [phone]

## 2017-11-02 ENCOUNTER — Ambulatory Visit: Payer: Medicare HMO | Admitting: Adult Health

## 2017-11-02 ENCOUNTER — Other Ambulatory Visit: Payer: Self-pay | Admitting: Adult Health

## 2017-11-02 DIAGNOSIS — D689 Coagulation defect, unspecified: Secondary | ICD-10-CM | POA: Diagnosis not present

## 2017-11-02 DIAGNOSIS — D631 Anemia in chronic kidney disease: Secondary | ICD-10-CM | POA: Diagnosis not present

## 2017-11-02 DIAGNOSIS — E1129 Type 2 diabetes mellitus with other diabetic kidney complication: Secondary | ICD-10-CM | POA: Diagnosis not present

## 2017-11-02 DIAGNOSIS — N186 End stage renal disease: Secondary | ICD-10-CM | POA: Diagnosis not present

## 2017-11-02 DIAGNOSIS — Z23 Encounter for immunization: Secondary | ICD-10-CM | POA: Diagnosis not present

## 2017-11-02 DIAGNOSIS — E039 Hypothyroidism, unspecified: Secondary | ICD-10-CM | POA: Diagnosis not present

## 2017-11-02 DIAGNOSIS — N2581 Secondary hyperparathyroidism of renal origin: Secondary | ICD-10-CM | POA: Diagnosis not present

## 2017-11-03 DIAGNOSIS — Z992 Dependence on renal dialysis: Secondary | ICD-10-CM | POA: Diagnosis not present

## 2017-11-03 DIAGNOSIS — N186 End stage renal disease: Secondary | ICD-10-CM | POA: Diagnosis not present

## 2017-11-03 DIAGNOSIS — I129 Hypertensive chronic kidney disease with stage 1 through stage 4 chronic kidney disease, or unspecified chronic kidney disease: Secondary | ICD-10-CM | POA: Diagnosis not present

## 2017-11-04 DIAGNOSIS — Z23 Encounter for immunization: Secondary | ICD-10-CM | POA: Diagnosis not present

## 2017-11-04 DIAGNOSIS — R52 Pain, unspecified: Secondary | ICD-10-CM | POA: Diagnosis not present

## 2017-11-04 DIAGNOSIS — R509 Fever, unspecified: Secondary | ICD-10-CM | POA: Diagnosis not present

## 2017-11-04 DIAGNOSIS — E1129 Type 2 diabetes mellitus with other diabetic kidney complication: Secondary | ICD-10-CM | POA: Diagnosis not present

## 2017-11-04 DIAGNOSIS — B999 Unspecified infectious disease: Secondary | ICD-10-CM | POA: Diagnosis not present

## 2017-11-04 DIAGNOSIS — N186 End stage renal disease: Secondary | ICD-10-CM | POA: Diagnosis not present

## 2017-11-04 DIAGNOSIS — N2581 Secondary hyperparathyroidism of renal origin: Secondary | ICD-10-CM | POA: Diagnosis not present

## 2017-11-04 DIAGNOSIS — D689 Coagulation defect, unspecified: Secondary | ICD-10-CM | POA: Diagnosis not present

## 2017-11-04 DIAGNOSIS — D631 Anemia in chronic kidney disease: Secondary | ICD-10-CM | POA: Diagnosis not present

## 2017-11-07 ENCOUNTER — Telehealth: Payer: Self-pay | Admitting: *Deleted

## 2017-11-07 NOTE — Telephone Encounter (Signed)
Received call from patient son, Eber Ferrufino [phone] telephone.   Requested FL2 to be completed for patient and spouse.   FL2's completed and available for pick up.

## 2017-11-14 ENCOUNTER — Other Ambulatory Visit (HOSPITAL_COMMUNITY): Payer: Medicare HMO

## 2017-11-14 ENCOUNTER — Ambulatory Visit (HOSPITAL_COMMUNITY): Payer: Medicare HMO

## 2017-11-17 ENCOUNTER — Telehealth: Payer: Self-pay | Admitting: *Deleted

## 2017-11-17 ENCOUNTER — Other Ambulatory Visit: Payer: Self-pay

## 2017-11-17 DIAGNOSIS — N185 Chronic kidney disease, stage 5: Secondary | ICD-10-CM

## 2017-11-17 NOTE — Telephone Encounter (Signed)
Call from Mickel Baas at [facility] request appointment for patient to have HD access checked due to pain and swelling and unable to cannulate. Patient has [facility] for Dialysis. She is going to have a clinic supervisor check access at next HD day and will cancel this appointment it no longer needed.

## 2017-11-22 ENCOUNTER — Ambulatory Visit (INDEPENDENT_AMBULATORY_CARE_PROVIDER_SITE_OTHER): Payer: Self-pay | Admitting: Vascular Surgery

## 2017-11-22 ENCOUNTER — Encounter (HOSPITAL_COMMUNITY): Payer: Medicare HMO

## 2017-11-22 ENCOUNTER — Ambulatory Visit (HOSPITAL_COMMUNITY)
Admission: RE | Admit: 2017-11-22 | Discharge: 2017-11-22 | Disposition: A | Discharge status: Home / Self Care | Payer: Medicare HMO | Source: Ambulatory Visit | Attending: Vascular Surgery | Admitting: Vascular Surgery

## 2017-11-22 ENCOUNTER — Encounter: Payer: Self-pay | Admitting: Vascular Surgery

## 2017-11-22 VITALS — BP 117/60 | HR 73 | Temp 97.9°F | Resp 18 | Ht 66.0 in | Wt 157.0 lb

## 2017-11-22 DIAGNOSIS — N186 End stage renal disease: Secondary | ICD-10-CM

## 2017-11-22 DIAGNOSIS — N185 Chronic kidney disease, stage 5: Secondary | ICD-10-CM | POA: Insufficient documentation

## 2017-11-22 DIAGNOSIS — R69 Illness, unspecified: Secondary | ICD-10-CM | POA: Diagnosis not present

## 2017-11-22 DIAGNOSIS — I10 Essential (primary) hypertension: Secondary | ICD-10-CM | POA: Diagnosis not present

## 2017-11-22 DIAGNOSIS — D638 Anemia in other chronic diseases classified elsewhere: Secondary | ICD-10-CM | POA: Diagnosis not present

## 2017-11-22 DIAGNOSIS — R531 Weakness: Secondary | ICD-10-CM | POA: Diagnosis not present

## 2017-11-22 NOTE — Progress Notes (Signed)
   Patient name: Miguel Tucker MRN: [id number] DOB: 1930-06-23 Sex: male  REASON FOR VISIT: Evaluation left arm AV Gore-Tex graft  HPI: Miguel Tucker is a 84 y.o. male here today with his niece and also I spoke with his son by telephone.  He had placement of a right IJ catheter and left upper arm Gore-Tex graft on 10/18/2017 with Dr. Donzetta Matters.  Apparently there was some concern about erythema and pain of his left arm.  He has severe dementia.  Today he denies any discomfort associated with his arm.  He has no swelling.  Current Outpatient Medications  Medication Sig Dispense Refill  . allopurinol (ZYLOPRIM) 100 MG tablet Take 100 mg by mouth daily.     [name] amLODipine (NORVASC) 10 MG tablet Take 1 tablet (10 mg total) by mouth daily.    [name] aspirin EC 81 MG tablet Take 81 mg by mouth every morning.     . donepezil (ARICEPT) 5 MG tablet Take 1 tablet (5 mg total) by mouth at bedtime. 30 tablet 11  . ferrous sulfate 325 (65 FE) MG tablet Take 325 mg by mouth daily with breakfast.    . finasteride (PROSCAR) 5 MG tablet Take 5 mg by mouth daily.    . furosemide (LASIX) 40 MG tablet Take 40 mg by mouth 2 (two) times daily.     [name] HYDROcodone-acetaminophen (NORCO) 5-325 MG tablet Take 1 tablet by mouth every 6 (six) hours as needed for moderate pain. 12 tablet 0  . labetalol (NORMODYNE) 200 MG tablet Take 200 mg by mouth 2 (two) times daily.     . Misc. Devices (ADJUSTABLE ALUMINUM CANE) MISC Use the cane as needed for weight bearing 1 each 0  . montelukast (SINGULAIR) 10 MG tablet Take 10 mg by mouth at bedtime.    . valsartan (DIOVAN) 160 MG tablet Take 160 mg by mouth 2 (two) times daily.      No current facility-administered medications for this visit.      PHYSICAL EXAM: Vitals:   11/22/17 1229  BP: 117/60  Pulse: 73  Resp: 18  Temp: 97.9 F (36.6 C)  TempSrc: Oral  SpO2: 99%  Weight: 157 lb (71.2 kg)  Height: 5\' 6"  (1.676 m)    GENERAL: The  patient is a well-nourished male, in no acute distress. The vital signs are documented above. His graft looks perfect.  He has no drainage and no erythema or discomfort at the antecubital or axillary incision.  He has an excellent thrill throughout his graft and excellent bruit no surrounding erythema  Duplex shows good flow through the graft with some potential elevated velocities near the arterial anastomosis.  I suspect this is related to the vessel diameter.  MEDICAL ISSUES: Expected recovery now 1 month out from new left upper arm AV graft.  I feel that he is acceptable to use this at any time and would recommend removal of the catheter following several successful sessions   Rosetta Posner, MD [facility] Tel [phone] Pager [phone]

## 2017-11-23 DIAGNOSIS — E1129 Type 2 diabetes mellitus with other diabetic kidney complication: Secondary | ICD-10-CM | POA: Diagnosis not present

## 2017-11-23 DIAGNOSIS — Z23 Encounter for immunization: Secondary | ICD-10-CM | POA: Diagnosis not present

## 2017-11-23 DIAGNOSIS — R52 Pain, unspecified: Secondary | ICD-10-CM | POA: Diagnosis not present

## 2017-11-23 DIAGNOSIS — N186 End stage renal disease: Secondary | ICD-10-CM | POA: Diagnosis not present

## 2017-11-23 DIAGNOSIS — D689 Coagulation defect, unspecified: Secondary | ICD-10-CM | POA: Diagnosis not present

## 2017-11-23 DIAGNOSIS — D631 Anemia in chronic kidney disease: Secondary | ICD-10-CM | POA: Diagnosis not present

## 2017-11-23 DIAGNOSIS — N2581 Secondary hyperparathyroidism of renal origin: Secondary | ICD-10-CM | POA: Diagnosis not present

## 2017-11-23 DIAGNOSIS — B999 Unspecified infectious disease: Secondary | ICD-10-CM | POA: Diagnosis not present

## 2017-11-23 DIAGNOSIS — R509 Fever, unspecified: Secondary | ICD-10-CM | POA: Diagnosis not present

## 2017-11-24 DIAGNOSIS — F0280 Dementia in other diseases classified elsewhere without behavioral disturbance: Secondary | ICD-10-CM | POA: Diagnosis not present

## 2017-11-24 DIAGNOSIS — G301 Alzheimer's disease with late onset: Secondary | ICD-10-CM | POA: Diagnosis not present

## 2017-11-25 ENCOUNTER — Observation Stay (HOSPITAL_COMMUNITY)
Admission: EM | Admit: 2017-11-25 | Discharge: 2017-11-27 | Disposition: A | Discharge status: Home / Self Care | Payer: Medicare HMO | Attending: Family Medicine | Admitting: Family Medicine

## 2017-11-25 ENCOUNTER — Encounter (HOSPITAL_COMMUNITY): Payer: Self-pay | Admitting: Emergency Medicine

## 2017-11-25 ENCOUNTER — Other Ambulatory Visit: Payer: Self-pay

## 2017-11-25 ENCOUNTER — Emergency Department (HOSPITAL_COMMUNITY): Payer: Medicare HMO

## 2017-11-25 ENCOUNTER — Telehealth: Payer: Self-pay | Admitting: *Deleted

## 2017-11-25 DIAGNOSIS — K449 Diaphragmatic hernia without obstruction or gangrene: Secondary | ICD-10-CM | POA: Diagnosis not present

## 2017-11-25 DIAGNOSIS — K31811 Angiodysplasia of stomach and duodenum with bleeding: Secondary | ICD-10-CM | POA: Diagnosis not present

## 2017-11-25 DIAGNOSIS — Z992 Dependence on renal dialysis: Secondary | ICD-10-CM | POA: Insufficient documentation

## 2017-11-25 DIAGNOSIS — H9190 Unspecified hearing loss, unspecified ear: Secondary | ICD-10-CM | POA: Diagnosis not present

## 2017-11-25 DIAGNOSIS — E7800 Pure hypercholesterolemia, unspecified: Secondary | ICD-10-CM | POA: Diagnosis not present

## 2017-11-25 DIAGNOSIS — N186 End stage renal disease: Secondary | ICD-10-CM

## 2017-11-25 DIAGNOSIS — D62 Acute posthemorrhagic anemia: Secondary | ICD-10-CM | POA: Diagnosis not present

## 2017-11-25 DIAGNOSIS — I959 Hypotension, unspecified: Secondary | ICD-10-CM | POA: Diagnosis not present

## 2017-11-25 DIAGNOSIS — Z880 Allergy status to penicillin: Secondary | ICD-10-CM | POA: Insufficient documentation

## 2017-11-25 DIAGNOSIS — F0390 Unspecified dementia without behavioral disturbance: Secondary | ICD-10-CM | POA: Insufficient documentation

## 2017-11-25 DIAGNOSIS — M25569 Pain in unspecified knee: Secondary | ICD-10-CM | POA: Diagnosis not present

## 2017-11-25 DIAGNOSIS — M79605 Pain in left leg: Secondary | ICD-10-CM | POA: Diagnosis not present

## 2017-11-25 DIAGNOSIS — D631 Anemia in chronic kidney disease: Secondary | ICD-10-CM | POA: Insufficient documentation

## 2017-11-25 DIAGNOSIS — R5383 Other fatigue: Secondary | ICD-10-CM | POA: Insufficient documentation

## 2017-11-25 DIAGNOSIS — R42 Dizziness and giddiness: Secondary | ICD-10-CM

## 2017-11-25 DIAGNOSIS — M109 Gout, unspecified: Secondary | ICD-10-CM | POA: Diagnosis not present

## 2017-11-25 DIAGNOSIS — E876 Hypokalemia: Secondary | ICD-10-CM | POA: Insufficient documentation

## 2017-11-25 DIAGNOSIS — I120 Hypertensive chronic kidney disease with stage 5 chronic kidney disease or end stage renal disease: Secondary | ICD-10-CM | POA: Insufficient documentation

## 2017-11-25 DIAGNOSIS — D649 Anemia, unspecified: Secondary | ICD-10-CM | POA: Diagnosis not present

## 2017-11-25 DIAGNOSIS — R262 Difficulty in walking, not elsewhere classified: Secondary | ICD-10-CM | POA: Diagnosis not present

## 2017-11-25 DIAGNOSIS — N185 Chronic kidney disease, stage 5: Secondary | ICD-10-CM | POA: Diagnosis not present

## 2017-11-25 DIAGNOSIS — E1122 Type 2 diabetes mellitus with diabetic chronic kidney disease: Secondary | ICD-10-CM | POA: Insufficient documentation

## 2017-11-25 DIAGNOSIS — K2970 Gastritis, unspecified, without bleeding: Secondary | ICD-10-CM | POA: Diagnosis not present

## 2017-11-25 DIAGNOSIS — D509 Iron deficiency anemia, unspecified: Secondary | ICD-10-CM | POA: Diagnosis not present

## 2017-11-25 DIAGNOSIS — Z79899 Other long term (current) drug therapy: Secondary | ICD-10-CM | POA: Insufficient documentation

## 2017-11-25 DIAGNOSIS — R531 Weakness: Secondary | ICD-10-CM | POA: Diagnosis not present

## 2017-11-25 DIAGNOSIS — R350 Frequency of micturition: Secondary | ICD-10-CM | POA: Diagnosis not present

## 2017-11-25 DIAGNOSIS — G8929 Other chronic pain: Secondary | ICD-10-CM | POA: Insufficient documentation

## 2017-11-25 DIAGNOSIS — J45909 Unspecified asthma, uncomplicated: Secondary | ICD-10-CM | POA: Insufficient documentation

## 2017-11-25 DIAGNOSIS — I10 Essential (primary) hypertension: Secondary | ICD-10-CM | POA: Diagnosis present

## 2017-11-25 DIAGNOSIS — D638 Anemia in other chronic diseases classified elsewhere: Secondary | ICD-10-CM | POA: Diagnosis present

## 2017-11-25 LAB — COMPREHENSIVE METABOLIC PANEL
ALT: 29 U/L (ref 0–44)
AST: 26 U/L (ref 15–41)
Albumin: 3.3 g/dL — ABNORMAL LOW (ref 3.5–5.0)
Alkaline Phosphatase: 107 U/L (ref 38–126)
Anion gap: 12 (ref 5–15)
BUN: 11 mg/dL (ref 8–23)
CHLORIDE: 94 mmol/L — AB (ref 98–111)
CO2: 30 mmol/L (ref 22–32)
CREATININE: 2.01 mg/dL — AB (ref 0.61–1.24)
Calcium: 8.6 mg/dL — ABNORMAL LOW (ref 8.9–10.3)
GFR, EST AFRICAN AMERICAN: 33 mL/min — AB (ref >60)
GFR, EST NON AFRICAN AMERICAN: 29 mL/min — AB (ref >60)
Glucose, Bld: 124 mg/dL — ABNORMAL HIGH (ref 70–99)
Potassium: 2.9 mmol/L — ABNORMAL LOW (ref 3.5–5.1)
Sodium: 136 mmol/L (ref 135–145)
Total Bilirubin: 0.4 mg/dL (ref 0.3–1.2)
Total Protein: 6.7 g/dL (ref 6.5–8.1)

## 2017-11-25 LAB — CBC WITH DIFFERENTIAL/PLATELET
ABS IMMATURE GRANULOCYTES: 0.14 10*3/uL — AB (ref 0.00–0.07)
BASOS PCT: 1 %
Basophils Absolute: 0.1 10*3/uL (ref 0.0–0.1)
EOS PCT: 4 %
Eosinophils Absolute: 0.3 10*3/uL (ref 0.0–0.5)
HCT: 21.1 % — ABNORMAL LOW (ref 39.0–52.0)
Hemoglobin: 6.4 g/dL — CL (ref 13.0–17.0)
Immature Granulocytes: 2 %
Lymphocytes Relative: 15 %
Lymphs Abs: 1.2 10*3/uL (ref 0.7–4.0)
MCH: 27.4 pg (ref 26.0–34.0)
MCHC: 30.3 g/dL (ref 30.0–36.0)
MCV: 90.2 fL (ref 80.0–100.0)
MONO ABS: 0.7 10*3/uL (ref 0.1–1.0)
Monocytes Relative: 8 %
NEUTROS ABS: 5.9 10*3/uL (ref 1.7–7.7)
Neutrophils Relative %: 70 %
Platelets: 342 10*3/uL (ref 150–400)
RBC: 2.34 MIL/uL — AB (ref 4.22–5.81)
RDW: 16.7 % — AB (ref 11.5–15.5)
WBC: 8.3 10*3/uL (ref 4.0–10.5)
nRBC: 0.4 % — ABNORMAL HIGH (ref 0.0–0.2)

## 2017-11-25 LAB — RETICULOCYTES
Immature Retic Fract: 18.9 % — ABNORMAL HIGH (ref 2.3–15.9)
RBC.: 2.35 MIL/uL — AB (ref 4.22–5.81)
RETIC COUNT ABSOLUTE: 48.9 10*3/uL (ref 19.0–186.0)
Retic Ct Pct: 2.1 % (ref 0.4–3.1)

## 2017-11-25 LAB — VITAMIN B12: VITAMIN B 12: 322 pg/mL (ref 180–914)

## 2017-11-25 LAB — PREPARE RBC (CROSSMATCH)

## 2017-11-25 LAB — TROPONIN I

## 2017-11-25 LAB — FOLATE: FOLATE: 18.1 ng/mL (ref >5.9)

## 2017-11-25 LAB — FERRITIN: Ferritin: 1363 ng/mL — ABNORMAL HIGH (ref 24–336)

## 2017-11-25 LAB — IRON AND TIBC
Iron: 184 ug/dL — ABNORMAL HIGH (ref 45–182)
Saturation Ratios: 72 % — ABNORMAL HIGH (ref 17.9–39.5)
TIBC: 255 ug/dL (ref 250–450)
UIBC: 71 ug/dL

## 2017-11-25 LAB — PROTIME-INR
INR: 0.95
PROTHROMBIN TIME: 12.6 s (ref 11.4–15.2)

## 2017-11-25 LAB — ABO/RH: ABO/RH(D): O POS

## 2017-11-25 MED ORDER — SODIUM CHLORIDE 0.9% FLUSH: 3.0000 mL | INTRAVENOUS | Status: DC | PRN

## 2017-11-25 MED ORDER — SODIUM CHLORIDE 0.9 % IV SOLN: 250.0000 mL | INTRAVENOUS | Status: DC | PRN

## 2017-11-25 MED ORDER — SODIUM CHLORIDE 0.9 % IV SOLN: 10.0000 mL/h | Freq: Once | INTRAVENOUS | Status: DC

## 2017-11-25 MED ORDER — SODIUM CHLORIDE 0.9% FLUSH
3.0000 mL | Freq: Two times a day (BID) | INTRAVENOUS | Status: DC
  Administered 2017-11-25 – 2017-11-26 (×3): 3 mL via INTRAVENOUS

## 2017-11-25 NOTE — Telephone Encounter (Signed)
Call placed to patient son Miguel Tucker in [place]. Reports that patient is currently in [facility] for care. Called the [facility]. Was advised that patient is currently under the care of Ferol Luz, MD. Spoke with Collie Siad, RN. Advised of Hgb counts from nephrology. States that MD is aware and patient will go to [facility] for evaluation and treatment.   Advised patient son that patient cannot have (2) PCP. Defer to Dr. Martha Clan at this time.

## 2017-11-25 NOTE — ED Triage Notes (Addendum)
Patient complaining of sudden onset of dizziness starting at 1630 after dialysis treatment today. Paperwork from lab states patient's HGB was 6.4 on 11/23/17. Patient states he was told his blood pressure dropped after dialysis treatment.

## 2017-11-25 NOTE — H&P (Signed)
History and Physical    JARMAR ROUSSEAU MPN:[id number] DOB: [birth year] DOA: 11/25/2017  PCP: Rica Koyanagi, MD  Patient coming from: home  Chief Complaint:  bp dropped during dialysis, abnormal labs feeling weak  HPI: Miguel Tucker is a 84 y.o. male with medical history significant of esrd mwf dialysis, dementia, chornic anemia sent in by dialysis for bp dropping and found to have hbg of 6.  Baseline of 8.  No overt bleeding per pt but unreliable rectal brown stool in ed but heme positive.  Pt complains of feeling weak and fatigues.  Referred for admission for need for transfusion.  Vitals normal here.no n/v/d.   Review of Systems: As per HPI otherwise 10 point review of systems negative but unreliable due to dementia  Past Medical History:  Diagnosis Date  . Asthma   . Chronic kidney disease   . Chronic knee pain   . Dementia ([place])   . Dementia ([place])   . Diabetes mellitus    type 2  . Frequent urination at night   . Gout   . Hard of hearing   . High cholesterol   . Hypertension   . Hypokalemia   . Radicular pain of left lower extremity     Past Surgical History:  Procedure Laterality Date  . APPENDECTOMY    . AV FISTULA PLACEMENT Left 10/18/2017   Procedure: INSERTION OF ARTERIOVENOUS (AV) GORE-TEX GRAFT LEFT UPPER  ARM;  Surgeon: Waynetta Sandy, MD;  Location: [place];  Service: Vascular;  Laterality: Left;  . BASCILIC VEIN TRANSPOSITION Left 09/09/2015   Procedure: FIRST STAGE BRACHIAL VEIN TRANSPOSITION LEFT ARM;  Surgeon: Elam Dutch, MD;  Location: [place];  Service: Vascular;  Laterality: Left;  . BASCILIC VEIN TRANSPOSITION Left 02/20/2016   Procedure: LEFT ARM SECOND STAGE BASILIC VEIN TRANSPOSITION;  Surgeon: Rosetta Posner, MD;  Location: [place];  Service: Vascular;  Laterality: Left;  . INSERTION OF DIALYSIS CATHETER Right 10/18/2017   Procedure: INSERTION OF TUNNELED  DIALYSIS CATHETER RIGHT INTERNAL JUGULAR;  Surgeon: Waynetta Sandy, MD;  Location: [place];  Service: Vascular;  Laterality: Right;  . OTHER SURGICAL HISTORY     testicular tumor- benign   . ROTATOR CUFF REPAIR Right   . WOUND DEBRIDEMENT  06/01/2011   Procedure: DEBRIDEMENT ABDOMINAL WOUND;  Surgeon: [name], MD;  Location: [facility];  Service: General;  Laterality: N/A;  Remove Sutures      reports that he quit smoking about 42 years ago. He has never used smokeless tobacco. He reports that he does not drink alcohol or use drugs.  Allergies  Allergen Reactions  . Penicillins Itching and Other (See Comments)    On arms only. Has patient had a PCN reaction causing immediate rash, facial/tongue/throat swelling, SOB or lightheadedness with hypotension: No Has patient had a PCN reaction causing severe rash involving mucus membranes or skin necrosis: No Has patient had a PCN reaction that required hospitalization No Has patient had a PCN reaction occurring within the last 10 years: No If all of the above answers are "NO", then may proceed with Cephalosporin use.     History reviewed. No pertinent family history.  Prior to Admission medications   Medication Sig Start Date End Date Taking? Authorizing Provider  allopurinol (ZYLOPRIM) 100 MG tablet Take 100 mg by mouth daily.  07/23/15   [provider]  amLODipine (NORVASC) 10 MG tablet Take 1 tablet (10 mg total) by mouth daily. 09/14/17  Kathie Dike, MD  aspirin EC 81 MG tablet Take 81 mg by mouth every morning.     [provider]  donepezil (ARICEPT) 5 MG tablet Take 1 tablet (5 mg total) by mouth at bedtime. 11/01/17   Ward Givens, NP  ferrous sulfate 325 (65 FE) MG tablet Take 325 mg by mouth daily with breakfast.    [provider]  finasteride (PROSCAR) 5 MG tablet Take 5 mg by mouth daily.    [provider]  furosemide (LASIX) 40 MG tablet Take 40 mg by mouth 2 (two) times daily.     [provider]  HYDROcodone-acetaminophen  (NORCO) 5-325 MG tablet Take 1 tablet by mouth every 6 (six) hours as needed for moderate pain. 10/18/17   Dagoberto Ligas, PA-C  labetalol (NORMODYNE) 200 MG tablet Take 200 mg by mouth 2 (two) times daily.  07/23/15   [provider]  Misc. Devices (ADJUSTABLE ALUMINUM CANE) MISC Use the cane as needed for weight bearing 06/22/17   Triplett, Tammy, PA-C  montelukast (SINGULAIR) 10 MG tablet Take 10 mg by mouth at bedtime.    [provider]  valsartan (DIOVAN) 160 MG tablet Take 160 mg by mouth 2 (two) times daily.     [provider]    Physical Exam: Vitals:   11/25/17 1716 11/25/17 1830 11/25/17 1845 11/25/17 1900  BP:  119/67  121/67  Pulse:  77 77   Resp:  16 16 16   Temp:      TempSrc:      SpO2:  98% 100%   Weight: 71.2 kg     Height: 5\' 6"  (1.676 m)         Constitutional: NAD, calm, comfortable Vitals:   11/25/17 1716 11/25/17 1830 11/25/17 1845 11/25/17 1900  BP:  119/67  121/67  Pulse:  77 77   Resp:  16 16 16   Temp:      TempSrc:      SpO2:  98% 100%   Weight: 71.2 kg     Height: 5\' 6"  (1.676 m)      Eyes: PERRL, lids and conjunctivae normal ENMT: Mucous membranes are moist. Posterior pharynx clear of any exudate or lesions.Normal dentition.  Neck: normal, supple, no masses, no thyromegaly Respiratory: clear to auscultation bilaterally, no wheezing, no crackles. Normal respiratory effort. No accessory muscle use.  Cardiovascular: Regular rate and rhythm, no murmurs / rubs / gallops. No extremity edema. 2+ pedal pulses. No carotid bruits.  Abdomen: no tenderness, no masses palpated. No hepatosplenomegaly. Bowel sounds positive.  Musculoskeletal: no clubbing / cyanosis. No joint deformity upper and lower extremities. Good ROM, no contractures. Normal muscle tone.  Skin: no rashes, lesions, ulcers. No induration Neurologic: CN 2-12 grossly intact. Sensation intact, DTR normal. Strength 5/5 in all 4.  Psychiatric: Normal judgment and  insight. Alert and oriented x 3. Normal mood.    Labs on Admission: I have personally reviewed following labs and imaging studies  CBC: Recent Labs  Lab 11/25/17 1807  WBC 8.3  NEUTROABS 5.9  HGB 6.4*  HCT 21.1*  MCV 90.2  PLT 151   Basic Metabolic Panel: Recent Labs  Lab 11/25/17 1807  NA 136  K 2.9*  CL 94*  CO2 30  GLUCOSE 124*  BUN 11  CREATININE 2.01*  CALCIUM 8.6*   GFR: Estimated Creatinine Clearance: 24.2 mL/min (A) (by C-G formula based on SCr of 2.01 mg/dL (H)). Liver Function Tests: Recent Labs  Lab 11/25/17 1807  AST 26  ALT 29  ALKPHOS 107  BILITOT 0.4  PROT 6.7  ALBUMIN 3.3*   No results for input(s): LIPASE, AMYLASE in the last 168 hours. No results for input(s): AMMONIA in the last 168 hours. Coagulation Profile: Recent Labs  Lab 11/25/17 1807  INR 0.95   Cardiac Enzymes: Recent Labs  Lab 11/25/17 1807  TROPONINI <0.03   BNP (last 3 results) No results for input(s): PROBNP in the last 8760 hours. HbA1C: No results for input(s): HGBA1C in the last 72 hours. CBG: No results for input(s): GLUCAP in the last 168 hours. Lipid Profile: No results for input(s): CHOL, HDL, LDLCALC, TRIG, CHOLHDL, LDLDIRECT in the last 72 hours. Thyroid Function Tests: No results for input(s): TSH, T4TOTAL, FREET4, T3FREE, THYROIDAB in the last 72 hours. Anemia Panel: No results for input(s): VITAMINB12, FOLATE, FERRITIN, TIBC, IRON, RETICCTPCT in the last 72 hours. Urine analysis:    Component Value Date/Time   COLORURINE YELLOW 09/06/2017 1600   APPEARANCEUR CLEAR 09/06/2017 1600   LABSPEC 1.011 09/06/2017 1600   PHURINE 5.0 09/06/2017 1600   GLUCOSEU NEGATIVE 09/06/2017 1600   HGBUR MODERATE (A) 09/06/2017 1600   BILIRUBINUR NEGATIVE 09/06/2017 1600   KETONESUR NEGATIVE 09/06/2017 1600   PROTEINUR 100 (A) 09/06/2017 1600   UROBILINOGEN 0.2 02/08/2008 0204   NITRITE NEGATIVE 09/06/2017 1600   LEUKOCYTESUR NEGATIVE 09/06/2017 1600   Sepsis  Labs: !!!!!!!!!!!!!!!!!!!!!!!!!!!!!!!!!!!!!!!!!!!! @LABRCNTIP (procalcitonin:4,lacticidven:4) )No results found for this or any previous visit (from the past 240 hour(s)).   Radiological Exams on Admission: Dg Chest 2 View  Result Date: 11/25/2017 CLINICAL DATA:  Weakness EXAM: CHEST - 2 VIEW COMPARISON:  Chest x-rays dated 10/18/2017 and 09/06/2017. FINDINGS: Heart size and mediastinal contours are stable. Lungs are clear. No pleural effusion or pneumothorax seen. RIGHT-sided central catheter is stable in position with tip at the level of the lower SVC. No acute or suspicious osseous finding. IMPRESSION: No active cardiopulmonary disease. Electronically Signed   By: Franki Cabot [name]   On: 11/25/2017 18:51    Old chart reviewed Case discussed with dr Elise Benne in the ed cxr reviewed no edema or infiltrate  Assessment/Plan 84 yo male h/o ESRD comes in with symptomatic anemia with no overt bleeding but heme positive  Principal Problem:   Symptomatic anemia- ck anemia panel.  Transfuse one unit for now.  No overt bleeding can proceed with outpt gi follow up  Active Problems:   Generalized weakness- transfuse   CKD (chronic kidney disease) stage 5, GFR less than 15 ml/min (HCC)- noted   HTN (hypertension)- hold bp meds   Anemia, chronic disease- anemai panel pending.  transfusing   Dementia ([place])- noted     DVT prophylaxis: scds Code Status:  full Family Communication: none Disposition Plan: tomorrow after transfusion Consults called:  none Admission status:  observation   Stein Windhorst A MD [facility]  If 7PM-7AM, please contact night-coverage [email or web address] Password [facility]  11/25/2017, 8:07 PM

## 2017-11-25 NOTE — Telephone Encounter (Signed)
-----   Message from [name], MD sent at 11/25/2017  2:08 PM EST ----- Regarding: RE: new pt Pt needs to go to ER if his hemoglobin is 6.  ----- Message ----- From: Sheral Flow, LPN Sent: [id number]/6967   9:45 AM EST To: [name], MD Subject: FW: new pt                                     Can we put him on Monday at 10 in the same day? ----- Message ----- From: Merry Proud Sent: 11/25/2017   9:31 AM EST To: [name], MD, Eden Lathe Six, LPN Subject: new pt                                         This patient has seen [facility] for new pt/ rehab discharge app. Kieth Brightly at [facility] called and states that the patient needs an appointment with you ASAP. His hemoglobin is steadily trending downward from a 9 it is now at a 6.4.   Please advise.

## 2017-11-25 NOTE — ED Notes (Signed)
[facility] home # [id number] cell # [phone] wife's # [phone]

## 2017-11-25 NOTE — ED Provider Notes (Signed)
[facility] EMERGENCY DEPARTMENT Provider Note   CSN: [id number] Arrival date & time: 11/25/17  1708     History   Chief Complaint Chief Complaint  Patient presents with  . Dizziness    HPI Miguel Tucker is a 84 y.o. male.  The history is provided by the patient. The history is limited by the condition of the patient (Hx dementia).  Dizziness  Pt was seen at 1735. Per pt: States his blood pressure dropped while at HD today, approximately 1630.  Pt states he was told his "blood count was low" also, but cannot remember when he had it checked last. Pt states he feels generally weak and tired now. Denies CP/SOB, no abd pain, no N/V/D, no black or blood in stools, no fevers, no rash.    Past Medical History:  Diagnosis Date  . Asthma   . Chronic kidney disease   . Chronic knee pain   . Dementia ([place])   . Dementia ([place])   . Diabetes mellitus    type 2  . Frequent urination at night   . Gout   . Hard of hearing   . High cholesterol   . Hypertension   . Hypokalemia   . Radicular pain of left lower extremity     Patient Active Problem List   Diagnosis Date Noted  . Acute kidney injury superimposed on chronic kidney disease ([place])   . Generalized weakness 09/06/2017  . CKD (chronic kidney disease) stage 5, GFR less than 15 ml/min (HCC) 09/06/2017  . HTN (hypertension) 09/06/2017  . Anemia, chronic disease 09/06/2017  . Acute on chronic renal failure ([place]) 09/06/2017  . Dementia ([place]) 09/06/2017  . Onychomycosis 04/11/2017  . Hammer toe 04/11/2017  . Diabetes mellitus ([place]) 04/11/2017  . Osteoarthritis of knee 02/11/2017  . Difficulty in walking(719.7) 12/09/2011  . Radicular pain of left lower extremity 12/09/2011  . Wound healing, delayed 11/06/2010    Past Surgical History:  Procedure Laterality Date  . APPENDECTOMY    . AV FISTULA PLACEMENT Left 10/18/2017   Procedure: INSERTION OF ARTERIOVENOUS (AV) GORE-TEX GRAFT LEFT UPPER  ARM;  Surgeon: Waynetta Sandy, MD;  Location: [place];  Service: Vascular;  Laterality: Left;  . BASCILIC VEIN TRANSPOSITION Left 09/09/2015   Procedure: FIRST STAGE BRACHIAL VEIN TRANSPOSITION LEFT ARM;  Surgeon: Elam Dutch, MD;  Location: [place];  Service: Vascular;  Laterality: Left;  . BASCILIC VEIN TRANSPOSITION Left 02/20/2016   Procedure: LEFT ARM SECOND STAGE BASILIC VEIN TRANSPOSITION;  Surgeon: Rosetta Posner, MD;  Location: [place];  Service: Vascular;  Laterality: Left;  . INSERTION OF DIALYSIS CATHETER Right 10/18/2017   Procedure: INSERTION OF TUNNELED  DIALYSIS CATHETER RIGHT INTERNAL JUGULAR;  Surgeon: Waynetta Sandy, MD;  Location: [place];  Service: Vascular;  Laterality: Right;  . OTHER SURGICAL HISTORY     testicular tumor- benign   . ROTATOR CUFF REPAIR Right   . WOUND DEBRIDEMENT  06/01/2011   Procedure: DEBRIDEMENT ABDOMINAL WOUND;  Surgeon: [name], MD;  [facility];  Service: General;  Laterality: N/A;  Remove Sutures         Home Medications    Prior to Admission medications   Medication Sig Start Date End Date Taking? Authorizing Provider  allopurinol (ZYLOPRIM) 100 MG tablet Take 100 mg by mouth daily.  07/23/15   [provider]  amLODipine (NORVASC) 10 MG tablet Take 1 tablet (10 mg total) by mouth daily. 09/14/17   Memon,  Jolaine Artist, MD  aspirin EC 81 MG tablet Take 81 mg by mouth every morning.     [provider]  donepezil (ARICEPT) 5 MG tablet Take 1 tablet (5 mg total) by mouth at bedtime. 11/01/17   Ward Givens, NP  ferrous sulfate 325 (65 FE) MG tablet Take 325 mg by mouth daily with breakfast.    [provider]  finasteride (PROSCAR) 5 MG tablet Take 5 mg by mouth daily.    [provider]  furosemide (LASIX) 40 MG tablet Take 40 mg by mouth 2 (two) times daily.     [provider]  HYDROcodone-acetaminophen (NORCO) 5-325 MG tablet Take 1 tablet by mouth every 6 (six) hours as needed for moderate  pain. 10/18/17   Dagoberto Ligas, PA-C  labetalol (NORMODYNE) 200 MG tablet Take 200 mg by mouth 2 (two) times daily.  07/23/15   [provider]  Misc. Devices (ADJUSTABLE ALUMINUM CANE) MISC Use the cane as needed for weight bearing 06/22/17   Triplett, Tammy, PA-C  montelukast (SINGULAIR) 10 MG tablet Take 10 mg by mouth at bedtime.    [provider]  valsartan (DIOVAN) 160 MG tablet Take 160 mg by mouth 2 (two) times daily.     [provider]    Family History History reviewed. No pertinent family history.  Social History Social History   Tobacco Use  . Smoking status: Former Smoker    Last attempt to quit: 11/06/1975    Years since quitting: 42.0  . Smokeless tobacco: Never Used  Substance Use Topics  . Alcohol use: No  . Drug use: No     Allergies   Penicillins   Review of Systems Review of Systems  Unable to perform ROS: Dementia  Neurological: Positive for dizziness.     Physical Exam Updated Vital Signs BP (!) 112/53 (BP Location: Left Arm)   Pulse 77   Temp (!) 97.5 F (36.4 C) (Tympanic)   Resp 18   Ht 5\' 6"  (1.676 m)   Wt 71.2 kg   SpO2 100%   BMI 25.34 kg/m    Patient Vitals for the past 24 hrs:  BP Temp Temp src Pulse Resp SpO2 Height Weight  11/25/17 2000 118/66 - - 78 14 99 % - -  11/25/17 1900 121/67 - - - 16 - - -  11/25/17 1845 - - - 77 16 100 % - -  11/25/17 1830 119/67 - - 77 16 98 % - -  11/25/17 1716 - - - - - - 5\' 6"  (1.676 m) 71.2 kg  11/25/17 1715 (!) 112/53 (!) 97.5 F (36.4 C) Tympanic 77 18 100 % - -     19:41:49 Orthostatic Vital Signs JM  Orthostatic Lying   BP- Lying: 117/64   Pulse- Lying: 74       Orthostatic Sitting  BP- Sitting: 120/66   Pulse- Sitting: 82       Orthostatic Standing at 0 minutes  BP- Standing at 0 minutes: 106/61   Pulse- Standing at 0 minutes: 85       Physical Exam 1740: Physical examination:  Nursing notes reviewed; Vital signs and O2 SAT reviewed;   Constitutional: Well developed, Well nourished, Well hydrated, In no acute distress; Head:  Normocephalic, atraumatic; Eyes: EOMI, PERRL, No scleral icterus; ENMT: Mouth and pharynx normal, Mucous membranes moist; Neck: Supple, Full range of motion, No lymphadenopathy; Cardiovascular: Regular rate and rhythm, No gallop; Respiratory: Breath sounds clear & equal bilaterally, No wheezes.  Speaking full sentences with ease, Normal respiratory effort/excursion; Chest: Nontender, Movement normal; Abdomen: Soft, Nontender, Nondistended, Normal bowel sounds. Rectal exam performed w/permission of pt and ED RN chaperone present.  Anal tone normal.  Non-tender, soft brown stool in rectal vault, heme positive.  No fissures, no external hemorrhoids, no palp masses.; Genitourinary: No CVA tenderness; Extremities: Peripheral pulses normal, No tenderness, No edema, No calf edema or asymmetry.; Neuro: Awake, alert, confused re: events. No facial droop. Speech clear. No gross focal motor or sensory deficits in extremities.; Skin: Color normal, Warm, Dry.     ED Treatments / Results  Labs (all labs ordered are listed, but only abnormal results are displayed)   EKG EKG Interpretation  Date/Time:  Friday November 25 2017 17:27:36 EST Ventricular Rate:  74 PR Interval:    QRS Duration: 97 QT Interval:  420 QTC Calculation: 466 R Axis:   -46 Text Interpretation:  Sinus rhythm Multiple premature complexes, vent & supraven Left axis deviation Left anterior fascicular block Left ventricular hypertrophy Anterior Q waves, possibly due to LVH Baseline wander When compared with ECG of 09/06/2017 No significant change was found Confirmed by Francine Graven [phone]) on 11/25/2017 5:45:42 PM   Radiology   Procedures Procedures (including critical care time)  Medications Ordered in ED Medications - No data to display   Initial Impression / Assessment and Plan / ED Course  I have reviewed the triage vital signs and  the nursing notes.  Pertinent labs & imaging results that were available during my care of the patient were reviewed by me and considered in my medical decision making (see chart for details).  MDM Reviewed: previous chart, nursing note and vitals Reviewed previous: labs and ECG Interpretation: labs, ECG and x-ray Total time providing critical care: 30-74 minutes. This excludes time spent performing separately reportable procedures and services. Consults: admitting MD   CRITICAL CARE Performed by: Francine Graven Total critical care time: 35 minutes Critical care time was exclusive of separately billable procedures and treating other patients. Critical care was necessary to treat or prevent imminent or life-threatening deterioration. Critical care was time spent personally by me on the following activities: development of treatment plan with patient and/or surrogate as well as nursing, discussions with consultants, evaluation of patient's response to treatment, examination of patient, obtaining history from patient or surrogate, ordering and performing treatments and interventions, ordering and review of laboratory studies, ordering and review of radiographic studies, pulse oximetry and re-evaluation of patient's condition.   Results for orders placed or performed during the hospital encounter of 11/25/17  Comprehensive metabolic panel  Result Value Ref Range   Sodium 136 135 - 145 mmol/L   Potassium 2.9 (L) 3.5 - 5.1 mmol/L   Chloride 94 (L) 98 - 111 mmol/L   CO2 30 22 - 32 mmol/L   Glucose, Bld 124 (H) 70 - 99 mg/dL   BUN 11 8 - 23 mg/dL   Creatinine, Ser 2.01 (H) 0.61 - 1.24 mg/dL   Calcium 8.6 (L) 8.9 - 10.3 mg/dL   Total Protein 6.7 6.5 - 8.1 g/dL   Albumin 3.3 (L) 3.5 - 5.0 g/dL   AST 26 15 - 41 U/L   ALT 29 0 - 44 U/L   Alkaline Phosphatase 107 38 - 126 U/L   Total Bilirubin 0.4 0.3 - 1.2 mg/dL   GFR calc non Af Amer 29 (L) >60 mL/min   GFR calc Af Amer 33 (L) >60 mL/min    Anion gap 12 5 -  15  Troponin I - Once  Result Value Ref Range   Troponin I <0.03 <0.03 ng/mL  CBC with Differential  Result Value Ref Range   WBC 8.3 4.0 - 10.5 K/uL   RBC 2.34 (L) 4.22 - 5.81 MIL/uL   Hemoglobin 6.4 (LL) 13.0 - 17.0 g/dL   HCT 21.1 (L) 39.0 - 52.0 %   MCV 90.2 80.0 - 100.0 fL   MCH 27.4 26.0 - 34.0 pg   MCHC 30.3 30.0 - 36.0 g/dL   RDW 16.7 (H) 11.5 - 15.5 %   Platelets 342 150 - 400 K/uL   nRBC 0.4 (H) 0.0 - 0.2 %   Neutrophils Relative % 70 %   Neutro Abs 5.9 1.7 - 7.7 K/uL   Lymphocytes Relative 15 %   Lymphs Abs 1.2 0.7 - 4.0 K/uL   Monocytes Relative 8 %   Monocytes Absolute 0.7 0.1 - 1.0 K/uL   Eosinophils Relative 4 %   Eosinophils Absolute 0.3 0.0 - 0.5 K/uL   Basophils Relative 1 %   Basophils Absolute 0.1 0.0 - 0.1 K/uL   Immature Granulocytes 2 %   Abs Immature Granulocytes 0.14 (H) 0.00 - 0.07 K/uL  Protime-INR  Result Value Ref Range   Prothrombin Time 12.6 11.4 - 15.2 seconds   INR 0.95   Type and screen [facility]  Result Value Ref Range   ABO/RH(D) O POS    Antibody Screen NEG    Sample Expiration      11/28/2017 Performed at [facility], [address], [place], [place] [address]   ABO/Rh  Result Value Ref Range   ABO/RH(D)      O POS Performed at [facility], [address], [place], [place] [address]    Dg Chest 2 View Result Date: 11/25/2017 CLINICAL DATA:  Weakness EXAM: CHEST - 2 VIEW COMPARISON:  Chest x-rays dated 10/18/2017 and 09/06/2017. FINDINGS: Heart size and mediastinal contours are stable. Lungs are clear. No pleural effusion or pneumothorax seen. RIGHT-sided central catheter is stable in position with tip at the level of the lower SVC. No acute or suspicious osseous finding. IMPRESSION: No active cardiopulmonary disease. Electronically Signed   By: Franki Cabot [name]   On: 11/25/2017 18:51    Results for KEWAN, MCNEASE (MRN [id number]) as of 11/25/2017 19:57  Ref. Range 09/10/2017 06:32 09/13/2017  05:34 09/26/2017 11:27 10/18/2017 10:44 11/25/2017 18:07  Hemoglobin Latest Ref Range: 13.0 - 17.0 g/dL 8.4 (L) 9.4 (L) 10.2 (L) 11.2 (L) 6.4 (LL)  HCT Latest Ref Range: 39.0 - 52.0 % 26.3 (L) 29.5 (L)  33.0 (L) 21.1 (L)     2000:  H/H lower than previous, stool heme positive. Pt mildly orthostatic during VS. Will start PRBC transfusion and admit. Dx and testing d/w pt.  Questions answered.  Verb understanding, agreeable to admit. T/C returned from [facility]. Shanon Brow, case discussed, including:  HPI, pertinent PM/SHx, VS/PE, dx testing, ED course and treatment:  Agreeable to admit, requests to transfuse one unit PRBC's only for now.       Final Clinical Impressions(s) / ED Diagnoses   Final diagnoses:  None    ED Discharge Orders    None       Francine Graven, DO 11/27/17 2133

## 2017-11-25 NOTE — ED Notes (Signed)
Date and time results received: 11/25/17 7:01 PM    Test: Hemoglobin Critical Value: 6.4  Name of Provider Notified: MD Thurnell Garbe  Orders Received? Or Actions Taken?: MD notified

## 2017-11-26 ENCOUNTER — Encounter (HOSPITAL_COMMUNITY): Payer: Self-pay | Admitting: Family Medicine

## 2017-11-26 DIAGNOSIS — D649 Anemia, unspecified: Secondary | ICD-10-CM | POA: Diagnosis not present

## 2017-11-26 DIAGNOSIS — N185 Chronic kidney disease, stage 5: Secondary | ICD-10-CM | POA: Diagnosis not present

## 2017-11-26 DIAGNOSIS — R531 Weakness: Secondary | ICD-10-CM | POA: Diagnosis not present

## 2017-11-26 DIAGNOSIS — D638 Anemia in other chronic diseases classified elsewhere: Secondary | ICD-10-CM | POA: Diagnosis not present

## 2017-11-26 DIAGNOSIS — R195 Other fecal abnormalities: Secondary | ICD-10-CM | POA: Diagnosis not present

## 2017-11-26 DIAGNOSIS — K31811 Angiodysplasia of stomach and duodenum with bleeding: Secondary | ICD-10-CM | POA: Diagnosis not present

## 2017-11-26 DIAGNOSIS — K449 Diaphragmatic hernia without obstruction or gangrene: Secondary | ICD-10-CM | POA: Diagnosis not present

## 2017-11-26 DIAGNOSIS — F0390 Unspecified dementia without behavioral disturbance: Secondary | ICD-10-CM | POA: Diagnosis not present

## 2017-11-26 DIAGNOSIS — I10 Essential (primary) hypertension: Secondary | ICD-10-CM | POA: Diagnosis not present

## 2017-11-26 DIAGNOSIS — K2970 Gastritis, unspecified, without bleeding: Secondary | ICD-10-CM | POA: Diagnosis not present

## 2017-11-26 DIAGNOSIS — R69 Illness, unspecified: Secondary | ICD-10-CM | POA: Diagnosis not present

## 2017-11-26 DIAGNOSIS — D500 Iron deficiency anemia secondary to blood loss (chronic): Secondary | ICD-10-CM | POA: Diagnosis not present

## 2017-11-26 LAB — BASIC METABOLIC PANEL
Anion gap: 9 (ref 5–15)
BUN: 16 mg/dL (ref 8–23)
CO2: 31 mmol/L (ref 22–32)
CREATININE: 3.09 mg/dL — AB (ref 0.61–1.24)
Calcium: 8.9 mg/dL (ref 8.9–10.3)
Chloride: 97 mmol/L — ABNORMAL LOW (ref 98–111)
GFR calc non Af Amer: 17 mL/min — ABNORMAL LOW (ref >60)
GFR, EST AFRICAN AMERICAN: 20 mL/min — AB (ref >60)
Glucose, Bld: 95 mg/dL (ref 70–99)
Potassium: 2.9 mmol/L — ABNORMAL LOW (ref 3.5–5.1)
Sodium: 137 mmol/L (ref 135–145)

## 2017-11-26 LAB — MRSA PCR SCREENING: MRSA by PCR: NEGATIVE

## 2017-11-26 LAB — CBC
HCT: 22.3 % — ABNORMAL LOW (ref 39.0–52.0)
Hemoglobin: 7 g/dL — ABNORMAL LOW (ref 13.0–17.0)
MCH: 28 pg (ref 26.0–34.0)
MCHC: 31.4 g/dL (ref 30.0–36.0)
MCV: 89.2 fL (ref 80.0–100.0)
NRBC: 0 % (ref 0.0–0.2)
Platelets: 286 10*3/uL (ref 150–400)
RBC: 2.5 MIL/uL — ABNORMAL LOW (ref 4.22–5.81)
RDW: 16 % — AB (ref 11.5–15.5)
WBC: 7.1 10*3/uL (ref 4.0–10.5)

## 2017-11-26 LAB — HEMOGLOBIN AND HEMATOCRIT, BLOOD
HCT: 26.9 % — ABNORMAL LOW (ref 39.0–52.0)
Hemoglobin: 8.3 g/dL — ABNORMAL LOW (ref 13.0–17.0)

## 2017-11-26 LAB — MAGNESIUM: MAGNESIUM: 1.8 mg/dL (ref 1.7–2.4)

## 2017-11-26 LAB — PREPARE RBC (CROSSMATCH)

## 2017-11-26 MED ORDER — SODIUM CHLORIDE 0.9 % IV SOLN: INTRAVENOUS | Status: DC

## 2017-11-26 MED ORDER — POTASSIUM CHLORIDE CRYS ER 20 MEQ PO TBCR
30.0000 meq | EXTENDED_RELEASE_TABLET | Freq: Once | ORAL | Status: AC
  Administered 2017-11-26: 30 meq via ORAL
  Filled 2017-11-26: qty 1

## 2017-11-26 MED ORDER — SODIUM CHLORIDE 0.9% IV SOLUTION
Freq: Once | INTRAVENOUS | Status: AC
  Administered 2017-11-26: 10:00:00 via INTRAVENOUS

## 2017-11-26 MED ORDER — PANTOPRAZOLE SODIUM 40 MG PO TBEC
40.0000 mg | DELAYED_RELEASE_TABLET | Freq: Every day | ORAL | Status: DC
  Administered 2017-11-26 – 2017-11-27 (×2): 40 mg via ORAL
  Filled 2017-11-26 (×2): qty 1

## 2017-11-26 NOTE — Consult Note (Signed)
Referring Provider: Irwin Brakeman, MD Primary Care Physician:  Rica Koyanagi, MD Primary Gastroenterologist:  Dr. Laural Golden  Reason for Consultation:    Anemia and heme positive stool.  HPI:   Patient is 84 year old Afro-American male with multiple medical problems including dementia who underwent hemodialysis yesterday.  He had blood work during dialysis and his hemoglobin was low.  He was therefore referred to emergency room for further evaluation.  His hemoglobin was 6.4 g.  He is generally runs over 9. Patient was evaluated by Dr. Francine Graven in emergency room.  He was noted to have heme positive stool. Patient's hemoglobin this morning was 7 g.  He is receiving a unit of PRBCs. Patient has been cared for in a nursing home for the last 2 weeks or so.  No melena or rectal bleeding reported. Patient has no complaints.  He says he has good appetite.  According to his nurse he ate all of his breakfast.  He denies nausea vomiting heartburn dysphagia or abdominal pain.  He also denies melena or rectal bleeding. There is no history of peptic ulcer disease. Patient is on low-dose aspirin. Patient has never been screened for CRC according to his son Miguel Tucker.  Patient is married.  His wife also is in poor health and in the same nursing home that he is at.  He states one son died in his 67s.  He states he has some kind of cancer but he does not remember.  Other 2 sons are in good health.  His son Miguel Tucker has a power of attorney with whom I talked over the phone. Patient does not smoke cigarettes or drink alcohol.   Past Medical History:  Diagnosis Date  . Asthma   . Chronic kidney disease   . Chronic knee pain   .    [name] Dementia ([place])   . Diabetes mellitus    type 2  . Frequent urination at night   . Gout   . Hard of hearing   . High cholesterol   . Hypertension   .    [name] Radicular pain of left lower extremity     Past Surgical History:  Procedure Laterality Date   . APPENDECTOMY    . AV FISTULA PLACEMENT Left 10/18/2017   Procedure: INSERTION OF ARTERIOVENOUS (AV) GORE-TEX GRAFT LEFT UPPER  ARM;  Surgeon: Waynetta Sandy, MD;  Location: [facility];  Service: Vascular;  Laterality: Left;  . BASCILIC VEIN TRANSPOSITION Left 09/09/2015   Procedure: FIRST STAGE BRACHIAL VEIN TRANSPOSITION LEFT ARM;  Surgeon: Elam Dutch, MD;  Location: [facility];  Service: Vascular;  Laterality: Left;  . BASCILIC VEIN TRANSPOSITION Left 02/20/2016   Procedure: LEFT ARM SECOND STAGE BASILIC VEIN TRANSPOSITION;  Surgeon: Rosetta Posner, MD;  Location: [facility];  Service: Vascular;  Laterality: Left;  . INSERTION OF DIALYSIS CATHETER Right 10/18/2017   Procedure: INSERTION OF TUNNELED  DIALYSIS CATHETER RIGHT INTERNAL JUGULAR;  Surgeon: Waynetta Sandy, MD;  Location: [facility];  Service: Vascular;  Laterality: Right;  . OTHER SURGICAL HISTORY     testicular tumor- benign   . ROTATOR CUFF REPAIR Right   . WOUND DEBRIDEMENT  06/01/2011   Procedure: DEBRIDEMENT ABDOMINAL WOUND;  Surgeon: [name], MD;  Location: [facility];  Service: General;  Laterality: N/A;  Remove Sutures     Prior to Admission medications   Medication Sig Start Date End Date Taking? Authorizing Provider  allopurinol (ZYLOPRIM) 100 MG tablet Take 100 mg by  mouth daily.  07/23/15  Yes [provider]  amLODipine (NORVASC) 10 MG tablet Take 1 tablet (10 mg total) by mouth daily. 09/14/17  Yes Kathie Dike, MD  aspirin EC 81 MG tablet Take 81 mg by mouth every morning.    Yes [provider]  donepezil (ARICEPT) 5 MG tablet Take 1 tablet (5 mg total) by mouth at bedtime. Patient taking differently: Take 10 mg by mouth at bedtime.  11/01/17  Yes Ward Givens, NP  ferrous sulfate 325 (65 FE) MG tablet Take 325 mg by mouth daily with breakfast.   Yes [provider]  finasteride (PROSCAR) 5 MG tablet Take 5 mg by mouth daily.   Yes [provider]  furosemide (LASIX)  40 MG tablet Take 40 mg by mouth 2 (two) times daily.    Yes [provider]  HYDROcodone-acetaminophen (NORCO) 5-325 MG tablet Take 1 tablet by mouth every 6 (six) hours as needed for moderate pain. 10/18/17  Yes Dagoberto Ligas, PA-C  labetalol (NORMODYNE) 200 MG tablet Take 200 mg by mouth 2 (two) times daily.  07/23/15  Yes [provider]  [place]. Devices (ADJUSTABLE ALUMINUM CANE) MISC Use the cane as needed for weight bearing 06/22/17  Yes Triplett, Tammy, PA-C  montelukast (SINGULAIR) 10 MG tablet Take 10 mg by mouth at bedtime.   Yes [provider]  valsartan (DIOVAN) 160 MG tablet Take 160 mg by mouth 2 (two) times daily.    Yes [provider]    Current Facility-Administered Medications  Medication Dose Route Frequency Provider Last Rate Last Dose  . 0.9 %  sodium chloride infusion  250 mL Intravenous PRN Derrill Kay A, MD      . pantoprazole (PROTONIX) EC tablet 40 mg  40 mg Oral Q0600 Johnson, Clanford L, MD   40 mg at 11/26/17 0945  . sodium chloride flush (NS) 0.9 % injection 3 mL  3 mL Intravenous Q12H Derrill Kay A, MD   3 mL at 11/26/17 0825  . sodium chloride flush (NS) 0.9 % injection 3 mL  3 mL Intravenous PRN Phillips Grout, MD        Allergies as of 11/25/2017 - Review Complete 11/25/2017  Allergen Reaction Noted  . Penicillins Itching and Other (See Comments) 10/30/2010    History reviewed. No pertinent family history.  Social History   Socioeconomic History  . Marital status: Married    Spouse name: Not on file  . Number of children: 3  . Years of education: 10  . Highest education level: Not on file  Occupational History  . Occupation: Retired  Scientific laboratory technician  . Financial resource strain: Not on file  . Food insecurity:    Worry: Not on file    Inability: Not on file  . Transportation needs:    Medical: Not on file    Non-medical: Not on file  Tobacco Use  . Smoking status: Former Smoker    Last attempt to  quit: 11/06/1975    Years since quitting: 42.0  . Smokeless tobacco: Never Used  Substance and Sexual Activity  . Alcohol use: No  . Drug use: No  . Sexual activity: Not on file  Lifestyle  . Physical activity:    Days per week: Not on file    Minutes per session: Not on file  . Stress: Not on file  Relationships  . Social connections:    Talks on phone: Not on file    Gets together: Not on  file    Attends religious service: Not on file    Active member of club or organization: Not on file    Attends meetings of clubs or organizations: Not on file    Relationship status: Not on file  . Intimate partner violence:    Fear of current or ex partner: Not on file    Emotionally abused: Not on file    Physically abused: Not on file    Forced sexual activity: Not on file  Other Topics Concern  . Not on file  Social History Narrative   Lives with spouse   Caffeine use:  none   Right handed    Review of Systems: See HPI, otherwise normal ROS  Physical Exam: Temp:  [97.5 F (36.4 C)-98.7 F (37.1 C)] 98 F (36.7 C) (11/23 1230) Pulse Rate:  [70-82] 80 (11/23 1230) Resp:  [11-20] 20 (11/23 1230) BP: (112-145)/(48-79) 132/65 (11/23 1230) SpO2:  [98 %-100 %] 98 % (11/23 1230) Weight:  [68.5 kg-71.2 kg] 68.5 kg (11/22 2211) Last BM Date: 11/26/17   Vernard Gambles is awake and alert.  He knows he is at any pain hospital but does not know the month or the year(he thought was March 2009) He responds appropriately to simple questions. Conjunctiva is pale.  Sclerae nonicteric. Oral pharyngeal mucosa is normal. No thyromegaly or lymphadenopathy noted. Cardiac exam with regular rhythm normal S1 and S2.  He has grade 2/6 systolic ejection murmur best heard at left sternal border. Auscultation of lungs reveal vesicle of breath sounds bilaterally. Abdominal exam reveals midline scar which starts above the level of umbilicus and goes all the way down to epigastrium.  He has small umbilical hernia  which is completely reducible.  Bowel sounds are normal.  On palpation abdomen is soft and nontender with organomegaly or masses. Rectal examination deferred.  He had one in emergency room but he was noted to have heme positive stool. No peripheral edema or clubbing noted.   Lab Results: Recent Labs    11/25/17 1807 11/26/17 0612  WBC 8.3 7.1  HGB 6.4* 7.0*  HCT 21.1* 22.3*  PLT 342 286   BMET Recent Labs    11/25/17 1807 11/26/17 0612  NA 136 137  K 2.9* 2.9*  CL 94* 97*  CO2 30 31  GLUCOSE 124* 95  BUN 11 16  CREATININE 2.01* 3.09*  CALCIUM 8.6* 8.9   LFT Recent Labs    11/25/17 1807  PROT 6.7  ALBUMIN 3.3*  AST 26  ALT 29  ALKPHOS 107  BILITOT 0.4   PT/INR Recent Labs    11/25/17 1807  LABPROT 12.6  INR 0.95   Hepatitis Panel No results for input(s): HEPBSAG, HCVAB, HEPAIGM, HEPBIGM in the last 72 hours.  Studies/Results: Dg Chest 2 View  Result Date: 11/25/2017 CLINICAL DATA:  Weakness EXAM: CHEST - 2 VIEW COMPARISON:  Chest x-rays dated 10/18/2017 and 09/06/2017. FINDINGS: Heart size and mediastinal contours are stable. Lungs are clear. No pleural effusion or pneumothorax seen. RIGHT-sided central catheter is stable in position with tip at the level of the lower SVC. No acute or suspicious osseous finding. IMPRESSION: No active cardiopulmonary disease. Electronically Signed   By: Franki Cabot [name]   On: 11/25/2017 18:51    Assessment;  Patient is 84 year old Afro-American male resident of nursing home with history of dementia who was noted to have lower than normal hemoglobin at the time of hemodialysis.  He was therefore evaluated in the emergency room but his hemoglobin  was 6.4 g and stool is guaiac positive.  He does not have iron deficiency anemia based on iron studies if anything his iron stores are above normal. Patient is on low-dose aspirin.  He has no symptoms but pointing to upper or lower GI tract. According to his son Vineet Kinney he is  never been screened for CRC. Given that patient is on aspirin and upper GI tract should be evaluated first before considering colonoscopy. Agree with empiric PPI therapy.  Recommendations;  Diagnostic esophagogastroduodenoscopy tomorrow morning. Talked with his son Hari,Jr who has a power of attorney and he is agreeable to proceed with endoscopic evaluation.    LOS: 0 days   Shawntel Farnworth  11/26/2017, 2:02 PM

## 2017-11-26 NOTE — Progress Notes (Signed)
PROGRESS NOTE    Miguel Tucker  GYI:[id number]  DOB: May 27, 1930  DOA: 11/25/2017 PCP: Rica Koyanagi, MD   Brief Admission Hx: 84 y.o. male with medical history significant of ESRD mwf dialysis, dementia, chronic anemia sent in by dialysis team for bp dropping and found to have hbg of 6.  Baseline of 8.  No overt bleeding per pt but unreliable rectal brown stool in ed but heme positive.  Pt complains of feeling weak and fatigues.  Referred for admission for need for transfusion.  MDM/Assessment & Plan:   1. Acute on chronic blood loss anemia - Pt's Hg down from a reported baseline of 8 and he was noted to be Hemoccult positive in ED. He is on aspirin daily which is being held.  He has been transfused 1 unit PRBC and Hg improved from 6.4 to 7.0.  I am transfusing an additional 1 unit PRBC this morning.  I have asked for GI team to evaluate him.  Pt is poor historian and he is unsure if he has ever had a recent GI evaluation.  I could not find any documentation of GI work up in the records.  Add protonix.  2. ESRD on hemodialysis - he is treated on MWF and has been fairly stable.   3. Hypokalemia - 1 additional oral treatment has been ordered this morning and magnesium level has been checked.   4. Dementia - It is stable at this time but it makes getting his medical history more difficult because he does not remember a lot about it.    DVT prophylaxis: SCDs Code Status: Full  Family Communication: patient  Disposition Plan: home when cleared by GI team   Consultants:  GI  Procedures:  n/a  Antimicrobials:  n/a   Subjective: Pt says that he feels much better after the initial 1 unit PRBC transfusion.     Objective: Vitals:   11/25/17 2153 11/25/17 2211 11/26/17 0044 11/26/17 0547  BP: (!) 145/67 131/68 122/63 (!) 116/48  Pulse: 78 82 73 70  Resp: 14 18 15 16   Temp: 98.3 F (36.8 C) 98.3 F (36.8 C) 98.3 F (36.8 C) 98.3 F (36.8 C)  TempSrc: Oral Oral Oral Oral   SpO2: 100% 100% 100% 100%  Weight:  68.5 kg    Height:  5\' 5"  (1.651 m)      Intake/Output Summary (Last 24 hours) at 11/26/2017 0851 Last data filed at 11/26/2017 0042 Gross per 24 hour  Intake 365 ml  Output -  Net 365 ml   Filed Weights   11/25/17 1716 11/25/17 2211  Weight: 71.2 kg 68.5 kg     REVIEW OF SYSTEMS  As per history otherwise all reviewed and reported negative  Exam:  General exam: awake, alert, NAD, cooperative.   Respiratory system: Clear. No increased work of breathing. Cardiovascular system: S1 & S2 heard, RRR. No JVD, murmurs, gallops, clicks or pedal edema. Gastrointestinal system: Abdomen is nondistended, soft and nontender. Normal bowel sounds heard. Central nervous system: Alert and oriented. No focal neurological deficits. Extremities: no CCE.  Data Reviewed: Basic Metabolic Panel: Recent Labs  Lab 11/25/17 1807 11/26/17 0612  NA 136 137  K 2.9* 2.9*  CL 94* 97*  CO2 30 31  GLUCOSE 124* 95  BUN 11 16  CREATININE 2.01* 3.09*  CALCIUM 8.6* 8.9   Liver Function Tests: Recent Labs  Lab 11/25/17 1807  AST 26  ALT 29  ALKPHOS 107  BILITOT 0.4  PROT 6.7  ALBUMIN 3.3*   No results for input(s): LIPASE, AMYLASE in the last 168 hours. No results for input(s): AMMONIA in the last 168 hours. CBC: Recent Labs  Lab 11/25/17 1807 11/26/17 0612  WBC 8.3 7.1  NEUTROABS 5.9  --   HGB 6.4* 7.0*  HCT 21.1* 22.3*  MCV 90.2 89.2  PLT 342 286   Cardiac Enzymes: Recent Labs  Lab 11/25/17 1807  TROPONINI <0.03   CBG (last 3)  No results for input(s): GLUCAP in the last 72 hours. Recent Results (from the past 240 hour(s))  MRSA PCR Screening     Status: None   Collection Time: 11/26/17 12:22 AM  Result Value Ref Range Status   MRSA by PCR NEGATIVE NEGATIVE Final    Comment:        The GeneXpert MRSA Assay (FDA approved for NASAL specimens only), is one component of a comprehensive MRSA colonization surveillance program. It is  not intended to diagnose MRSA infection nor to guide or monitor treatment for MRSA infections. Performed at [facility], [address], [place], [place] [address]     Studies: Dg Chest 2 View  Result Date: 11/25/2017 CLINICAL DATA:  Weakness EXAM: CHEST - 2 VIEW COMPARISON:  Chest x-rays dated 10/18/2017 and 09/06/2017. FINDINGS: Heart size and mediastinal contours are stable. Lungs are clear. No pleural effusion or pneumothorax seen. RIGHT-sided central catheter is stable in position with tip at the level of the lower SVC. No acute or suspicious osseous finding. IMPRESSION: No active cardiopulmonary disease. Electronically Signed   By: Franki Cabot [name]   On: 11/25/2017 18:51   Scheduled Meds: . sodium chloride   Intravenous Once  . potassium chloride  30 mEq Oral Once  . sodium chloride flush  3 mL Intravenous Q12H   Continuous Infusions: . sodium chloride      Principal Problem:   Symptomatic anemia Active Problems:   Generalized weakness   CKD (chronic kidney disease) stage 5, GFR less than 15 ml/min (HCC)   HTN (hypertension)   Anemia, chronic disease   Dementia ([place])  Time spent:   Irwin Brakeman, MD [facility] Pager [phone]  If 7PM-7AM, please contact night-coverage [email or web address] Password [email or web address] 11/26/2017, 8:51 AM    LOS: 0 days

## 2017-11-26 NOTE — Plan of Care (Signed)

## 2017-11-27 ENCOUNTER — Encounter (HOSPITAL_COMMUNITY): Payer: Self-pay

## 2017-11-27 ENCOUNTER — Encounter (HOSPITAL_COMMUNITY)
Admission: EM | Disposition: A | Discharge status: Home / Self Care | Payer: Self-pay | Source: Home / Self Care | Attending: Emergency Medicine

## 2017-11-27 DIAGNOSIS — F0390 Unspecified dementia without behavioral disturbance: Secondary | ICD-10-CM | POA: Diagnosis not present

## 2017-11-27 DIAGNOSIS — R195 Other fecal abnormalities: Secondary | ICD-10-CM | POA: Diagnosis not present

## 2017-11-27 DIAGNOSIS — D649 Anemia, unspecified: Secondary | ICD-10-CM | POA: Diagnosis not present

## 2017-11-27 DIAGNOSIS — D638 Anemia in other chronic diseases classified elsewhere: Secondary | ICD-10-CM | POA: Diagnosis not present

## 2017-11-27 DIAGNOSIS — Z7401 Bed confinement status: Secondary | ICD-10-CM | POA: Diagnosis not present

## 2017-11-27 DIAGNOSIS — R0902 Hypoxemia: Secondary | ICD-10-CM | POA: Diagnosis not present

## 2017-11-27 DIAGNOSIS — I10 Essential (primary) hypertension: Secondary | ICD-10-CM | POA: Diagnosis not present

## 2017-11-27 DIAGNOSIS — K449 Diaphragmatic hernia without obstruction or gangrene: Secondary | ICD-10-CM | POA: Diagnosis not present

## 2017-11-27 DIAGNOSIS — R531 Weakness: Secondary | ICD-10-CM | POA: Diagnosis not present

## 2017-11-27 DIAGNOSIS — R69 Illness, unspecified: Secondary | ICD-10-CM | POA: Diagnosis not present

## 2017-11-27 DIAGNOSIS — D500 Iron deficiency anemia secondary to blood loss (chronic): Secondary | ICD-10-CM | POA: Diagnosis not present

## 2017-11-27 DIAGNOSIS — K31811 Angiodysplasia of stomach and duodenum with bleeding: Secondary | ICD-10-CM | POA: Diagnosis not present

## 2017-11-27 DIAGNOSIS — N185 Chronic kidney disease, stage 5: Secondary | ICD-10-CM | POA: Diagnosis not present

## 2017-11-27 DIAGNOSIS — K2970 Gastritis, unspecified, without bleeding: Secondary | ICD-10-CM | POA: Diagnosis not present

## 2017-11-27 HISTORY — PX: ESOPHAGOGASTRODUODENOSCOPY: SHX5428

## 2017-11-27 LAB — HEMOGLOBIN AND HEMATOCRIT, BLOOD
HEMATOCRIT: 26.1 % — AB (ref 39.0–52.0)
HEMOGLOBIN: 8 g/dL — AB (ref 13.0–17.0)

## 2017-11-27 SURGERY — EGD (ESOPHAGOGASTRODUODENOSCOPY)
Anesthesia: Moderate Sedation

## 2017-11-27 MED ORDER — PANTOPRAZOLE SODIUM 40 MG PO TBEC: 40.0000 mg | DELAYED_RELEASE_TABLET | Freq: Every day | ORAL | Status: DC

## 2017-11-27 MED ORDER — ASPIRIN EC 81 MG PO TBEC: 81.0000 mg | DELAYED_RELEASE_TABLET | ORAL | Status: DC

## 2017-11-27 MED ORDER — FENTANYL CITRATE (PF) 100 MCG/2ML IJ SOLN
INTRAMUSCULAR | Status: AC
  Filled 2017-11-27: qty 2

## 2017-11-27 MED ORDER — FUROSEMIDE 20 MG PO TABS: 20.0000 mg | ORAL_TABLET | Freq: Every day | ORAL | Status: DC

## 2017-11-27 MED ORDER — AMLODIPINE BESYLATE 5 MG PO TABS: 5.0000 mg | ORAL_TABLET | Freq: Every day | ORAL | Status: DC

## 2017-11-27 MED ORDER — LIDOCAINE VISCOUS HCL 2 % MT SOLN
OROMUCOSAL | Status: AC
  Filled 2017-11-27: qty 15

## 2017-11-27 MED ORDER — MIDAZOLAM HCL 5 MG/5ML IJ SOLN
INTRAMUSCULAR | Status: DC | PRN
  Administered 2017-11-27 (×4): 1 mg via INTRAVENOUS

## 2017-11-27 MED ORDER — LABETALOL HCL 100 MG PO TABS: 50.0000 mg | ORAL_TABLET | Freq: Two times a day (BID) | ORAL | Status: DC

## 2017-11-27 MED ORDER — DONEPEZIL HCL 5 MG PO TABS: 10.0000 mg | ORAL_TABLET | Freq: Every day | ORAL | Status: DC

## 2017-11-27 MED ORDER — SODIUM CHLORIDE 0.9 % IV SOLN
INTRAVENOUS | Status: DC | PRN
  Administered 2017-11-27: 1000 mL via INTRAMUSCULAR

## 2017-11-27 MED ORDER — STERILE WATER FOR IRRIGATION IR SOLN
Status: DC | PRN
  Administered 2017-11-27: 2.5 mL

## 2017-11-27 MED ORDER — MIDAZOLAM HCL 5 MG/5ML IJ SOLN
INTRAMUSCULAR | Status: AC
  Filled 2017-11-27: qty 10

## 2017-11-27 MED ORDER — FUROSEMIDE 20 MG PO TABS: 40.0000 mg | ORAL_TABLET | Freq: Every day | ORAL | Status: DC

## 2017-11-27 MED ORDER — FENTANYL CITRATE (PF) 100 MCG/2ML IJ SOLN
INTRAMUSCULAR | Status: DC | PRN
  Administered 2017-11-27 (×2): 15 ug via INTRAVENOUS

## 2017-11-27 MED ORDER — LIDOCAINE VISCOUS HCL 2 % MT SOLN
OROMUCOSAL | Status: DC | PRN
  Administered 2017-11-27: 5 mL via OROMUCOSAL

## 2017-11-27 NOTE — NC FL2 (Signed)
[place] LEVEL OF CARE SCREENING TOOL     IDENTIFICATION  Patient Name: Miguel Tucker Birthdate: 17-Dec-1930 Sex: male Admission Date (Current Location): 11/25/2017  [place] and [place] Number:  Engineering geologist and Address:  [facility], [address], [place], [place] [address]      Provider Number: [id number]  Attending Physician Name and Address:  Murlean Iba, MD  Relative Name and Phone Number:  [place], Skare [phone]     Current Level of Care: Hospital Recommended Level of Care: [place] Prior Approval Number:    Date Approved/Denied:   PASRR Number: [id number]  Discharge Plan: SNF    Current Diagnoses: Patient Active Problem List   Diagnosis Date Noted  . Symptomatic anemia 11/25/2017  . Acute kidney injury superimposed on chronic kidney disease ([place])   . Generalized weakness 09/06/2017  . CKD (chronic kidney disease) stage 5, GFR less than 15 ml/min (HCC) 09/06/2017  . HTN (hypertension) 09/06/2017  . Anemia, chronic disease 09/06/2017  . Acute on chronic renal failure ([place]) 09/06/2017  . Dementia ([place]) 09/06/2017  . Onychomycosis 04/11/2017  . Hammer toe 04/11/2017  . Diabetes mellitus ([place]) 04/11/2017  . Osteoarthritis of knee 02/11/2017  . Difficulty in walking(719.7) 12/09/2011  . Radicular pain of left lower extremity 12/09/2011  . Wound healing, delayed 11/06/2010    Orientation RESPIRATION BLADDER Height & Weight     Self, Place  Normal Incontinent Weight: 151 lb 0.2 oz (68.5 kg) Height:  5\' 5"  (165.1 cm)  BEHAVIORAL SYMPTOMS/MOOD NEUROLOGICAL BOWEL NUTRITION STATUS      Continent Diet(Carb modified)  AMBULATORY STATUS COMMUNICATION OF NEEDS Skin   Limited Assist Verbally Normal                       Personal Care Assistance Level of Assistance  Bathing, Feeding, Dressing Bathing Assistance: Limited assistance Feeding assistance:  Independent Dressing Assistance: Limited assistance     Functional Limitations Info  Sight, Hearing, Speech Sight Info: Adequate Hearing Info: Adequate Speech Info: Adequate    SPECIAL CARE FACTORS FREQUENCY                       Contractures Contractures Info: Not present    Additional Factors Info  Code Status, Allergies Code Status Info: Full Allergies Info: Penicillins           Current Medications (11/27/2017):  This is the current hospital active medication list Current Facility-Administered Medications  Medication Dose Route Frequency Provider Last Rate Last Dose  . 0.9 %  sodium chloride infusion  250 mL Intravenous PRN Derrill Kay A, MD      . fentaNYL (SUBLIMAZE) 100 MCG/2ML injection           . lidocaine (XYLOCAINE) 2 % viscous mouth solution           . midazolam (VERSED) 5 MG/5ML injection           . pantoprazole (PROTONIX) EC tablet 40 mg  40 mg Oral Q0600 Wynetta Emery, Clanford L, MD   40 mg at 11/27/17 0605  . sodium chloride flush (NS) 0.9 % injection 3 mL  3 mL Intravenous Q12H Derrill Kay A, MD   3 mL at 11/26/17 2100  . sodium chloride flush (NS) 0.9 % injection 3 mL  3 mL Intravenous PRN Phillips Grout, MD         Discharge Medications: Please see discharge summary for a  list of discharge medications.  Relevant Imaging Results:  Relevant Lab Results:   Additional Information SS# [id number]; dialysis MWF  Zettie Pho, LCSW

## 2017-11-27 NOTE — Op Note (Signed)
[facility] Patient Name: Miguel Tucker Procedure Date: 11/27/2017 7:48 AM MRN: [id number] Date of Birth: 06-Aug-1930 Attending MD: Hildred Laser , MD CSN: [id number] Age: 84 Admit Type: Inpatient Procedure:                Upper GI endoscopy Indications:              Anemia and heme positive stool Providers:                Hildred Laser, MD, Gwynneth Albright RN, RN,                            Randa Spike, Technician Referring MD:              Medicines:                Lidocaine jelly, Fentanyl 30 micrograms IV,                            Midazolam 4 mg IV Complications:            No immediate complications. Estimated Blood Loss:     Estimated blood loss: none. Procedure:                Pre-Anesthesia Assessment:                           - Prior to the procedure, a History and Physical                            was performed, and patient medications and                            allergies were reviewed. The patient's tolerance of                            previous anesthesia was also reviewed. The risks                            and benefits of the procedure and the sedation                            options and risks were discussed with the patient.                            All questions were answered, and informed consent                            was obtained. Prior Anticoagulants: The patient                            last took aspirin 2 days prior to the procedure.                            ASA Grade Assessment: III - A patient with severe  systemic disease. After reviewing the risks and                            benefits, the patient was deemed in satisfactory                            condition to undergo the procedure.                           After obtaining informed consent, the endoscope was                            passed under direct vision. Throughout the                            procedure, the patient's blood  pressure, pulse, and                            oxygen saturations were monitored continuously. The                            GIF-H190 ([id number]) scope was introduced through the                            mouth, and advanced to the second part of duodenum.                            The upper GI endoscopy was accomplished without                            difficulty. The patient tolerated the procedure                            well. Scope In: 8:28:04 AM Scope Out: 8:40:41 AM Total Procedure Duration: 0 hours 12 minutes 37 seconds  Findings:      The examined esophagus was normal.      The Z-line was found 40 cm from the incisors.      A 2 cm hiatal hernia was present.      Localized minimal inflammation characterized by congestion (edema)       erosion and erythema was found in the gastric body.      A single stigmata of recent bleeding angioectasia was found in the       gastric body and on the anterior wall of the stomach. Coagulation for       hemostasis using argon plasma was successful.      The exam of the stomach was otherwise normal.      The duodenal bulb and second portion of the duodenum were normal. Impression:               - Normal esophagus.                           - Z-line, 40 cm from the incisors.                           -  2 cm hiatal hernia.                           - Gastritis.                           - A single recently bleeding angioectasia in the                            stomach. Treated with argon plasma coagulation                            (APC).                           - Normal duodenal bulb and second portion of the                            duodenum.                           - No specimens collected. Moderate Sedation:      Moderate (conscious) sedation was administered by the endoscopy nurse       and supervised by the endoscopist. The following parameters were       monitored: oxygen saturation, heart rate, blood pressure, CO2        capnography and response to care. Total physician intraservice time was       20 minutes. Recommendation:           - Return patient to hospital ward for ongoing care.                           - Diabetic (ADA) diet today.                           - Continue present medications.                           - No aspirin, ibuprofen, naproxen, or other                            non-steroidal anti-inflammatory drugs for 5 days.                           - Perform an H. pylori serology. Procedure Code(s):        --- Professional ---                           [phone], Esophagogastroduodenoscopy, flexible,                            transoral; with control of bleeding, any method                           G0500, Moderate sedation services provided by the  same physician or other qualified health care                            professional performing a gastrointestinal                            endoscopic service that sedation supports,                            requiring the presence of an independent trained                            observer to assist in the monitoring of the                            patient's level of consciousness and physiological                            status; initial 15 minutes of intra-service time;                            patient age 15 years or older (additional time may                            be reported with [phone], as appropriate) Diagnosis Code(s):        --- Professional ---                           K44.9, Diaphragmatic hernia without obstruction or                            gangrene                           K29.70, Gastritis, unspecified, without bleeding                           K31.811, Angiodysplasia of stomach and duodenum                            with bleeding                           D50.0, Iron deficiency anemia secondary to blood                            loss (chronic)                           R19.5, Other  fecal abnormalities CPT copyright 2018 [employer]. All rights reserved. The codes documented in this report are preliminary and upon coder review may  be revised to meet current compliance requirements. Hildred Laser, MD Hildred Laser, MD 11/27/2017 8:55:33 AM This report has been signed electronically. Number of Addenda: 0

## 2017-11-27 NOTE — Clinical Social Work Note (Addendum)
CSW received consult that patient will discharge today to [facility] to continue respite care. CSW spoke with Gerald Stabs, the admissions coordinator, who has indicated that the patient should be fine to return today; however, he needs to check to be sure. The CSW will complete [place] 2 and fax all completed paperwork to the facility. The RN can call report to [phone] and ask for the [place]. CSW will print the medical necessity form to the department printer.  UPDATE: CSW has informed attending RN to call report to the above number. CSW is signing off. Please consult should needs arise.  Santiago Bumpers, MSW, Latanya Presser  [phone]

## 2017-11-27 NOTE — Plan of Care (Signed)
Adequate for discharge.

## 2017-11-27 NOTE — Discharge Summary (Addendum)
Physician Discharge Summary  Miguel Tucker ENI:[id number] DOB: [birth year] DOA: 11/25/2017  PCP: Rica Koyanagi, MD GI: Rehman  Admit date: 11/25/2017 Discharge date: 11/27/2017  Admitted From: SNF Disposition: SNF   Recommendations for SNF Please follow-up with your primary care physician in 2 weeks to have your CBC rechecked and to have a stool Hemoccult test done. Please hold aspirin for 5 days before restarting. Seek medical care or return to ER if symptoms come back, worsen or new problems develop.  Please resume your regular hemodialysis schedule tomorrow and go have your dialysis treatment as scheduled.  Please schedule a colonoscopy with GI if you have a positive hemoccult when tested in 2 weeks.  Blood pressure medications have been markedly reduced.  Please monitor blood pressure and adjust treatment as needed.  Please have patient follow up with nephrologist for medication check and adjustment in 1-2 weeks.   Discharge Condition: STABLE   CODE STATUS: FULL    Brief Hospitalization Summary: Please see all hospital notes, images, labs for full details of the hospitalization. Brief Admission Hx: 84 y.o.malewith medical history significant ofESRD mwf dialysis, dementia, chronic anemia sent in by dialysis team for bp dropping and found to have hbg of 6. Baseline of 8. No overt bleeding per pt but unreliable rectal brown stool in ed but heme positive. Pt complains of feeling weak and fatigues. Referred for admission for need for transfusion.  He has received 2 units of PRBC.  Hg up to 8.3.   MDM/Assessment & Plan:   1. Acute on chronic blood loss anemia - Pt's Hg down from a reported baseline of 8 and he was noted to be Hemoccult positive in ED. He is on aspirin daily which is being temporarily held.  He has been transfused 2 unit PRBC and Hg improved from 6.4 to 8.3.  He was started on protonix.  He was seen by GI and had EGD on 11/27/17.  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  There was a small focus of gastritis noted along the posterior wall.  Patient was advised to hold aspirin for 5 days before restarting.  Have a CBC rechecked in 2 weeks.  Also patient should have a stool Hemoccult test done in 2 weeks.  If the test is positive then the patient needs to be referred to GI for colonoscopy. 2. ESRD on hemodialysis - he is treated on MWF and he has been advised to resume his regular HD schedule tomorrow.   3. Hypokalemia -treated.    4. Dementia - It is stable on home meds.    5. Hypotension -patient was on very high doses of multiple blood pressure medications which have been markedly reduced.  The valsartan has been discontinued and the beta-blocker has been reduced by more than 50%.  The amlodipine has been reduced to 5 mg.  Recommend monitoring blood pressure and making adjustments to treatment as needed.  At this time the patient does not need the high doses of blood pressure medication that he was taking before he arrived.  Also the Lasix dose was reduced by 50% as well.  He should follow-up with his nephrologist and hemodialysis as scheduled.  Monitor blood pressure regularly at the skilled nursing facility.  DVT prophylaxis: SCDs Code Status: Full  Family Communication: patient  Disposition Plan: return to [facility]  Consultants:  GI  Procedures:  EGD 11/27/17:  Dr Laural Golden Brief EGD note: Small sliding hiatal hernia. 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 Ablated with APC. Small focus of gastritis and body along the posterior wall possibly due to aspirin. Antimicrobials:  n/a   Discharge Diagnoses:  Principal Problem:   Symptomatic anemia Active Problems:   Generalized weakness   CKD (chronic kidney disease) stage 5, GFR less than 15 ml/min (HCC)   HTN (hypertension)   Anemia, chronic  disease   Dementia (HCC)  Discharge Instructions: Discharge Instructions    Call MD for:  difficulty breathing, headache or visual disturbances   Complete by:  As directed    Call MD for:  extreme fatigue   Complete by:  As directed    Call MD for:  persistant dizziness or light-headedness   Complete by:  As directed    Call MD for:  persistant nausea and vomiting   Complete by:  As directed    Call MD for:  severe uncontrolled pain   Complete by:  As directed    Increase activity slowly   Complete by:  As directed      Allergies as of 11/27/2017      Reactions   Penicillins Itching, Other (See Comments)   On arms only. Has patient had a PCN reaction causing immediate rash, facial/tongue/throat swelling, SOB or lightheadedness with hypotension: No Has patient had a PCN reaction causing severe rash involving mucus membranes or skin necrosis: No Has patient had a PCN reaction that required hospitalization No Has patient had a PCN reaction occurring within the last 10 years: No If all of the above answers are "NO", then may proceed with Cephalosporin use.      Medication List    STOP taking these medications   HYDROcodone-acetaminophen 5-325 MG tablet Commonly known as:  NORCO/VICODIN   valsartan 160 MG tablet Commonly known as:  DIOVAN     TAKE these medications   Adjustable Aluminum Cane Misc Use the cane as needed for weight bearing   allopurinol 100 MG tablet Commonly known as:  ZYLOPRIM Take 100 mg by mouth daily.   amLODipine 5 MG tablet Commonly known as:  NORVASC Take 1 tablet (5 mg total) by mouth daily. Start taking on:  11/29/2017 What changed:    medication strength  how much to take  These instructions start on 11/29/2017. If you are unsure what to do until then, ask your doctor or other care provider.   aspirin EC 81 MG tablet Take 1 tablet (81 mg total) by mouth every morning. Start taking on:  12/03/2017 What changed:  These instructions  start on 12/03/2017. If you are unsure what to do until then, ask your doctor or other care provider.   donepezil 5 MG tablet Commonly known as:  ARICEPT Take 2 tablets (10 mg total) by mouth at bedtime.   ferrous sulfate 325 (65 FE) MG tablet Take 325 mg by mouth daily with breakfast.   finasteride 5 MG tablet Commonly known as:  PROSCAR Take 5 mg by mouth daily.   furosemide 20 MG tablet Commonly known as:  LASIX Take 1 tablet (20 mg total) by mouth daily. Start taking on:  11/29/2017 What changed:    medication strength  how much to take  when to take this  These instructions start on 11/29/2017. If you are unsure what to do until then, ask your doctor or other care provider.   labetalol 100 MG tablet Commonly known as:  NORMODYNE Take  0.5 tablets (50 mg total) by mouth 2 (two) times daily. Hold for SBP less than 120 or HR less than 60 Start taking on:  11/29/2017 What changed:    medication strength  how much to take  additional instructions  These instructions start on 11/29/2017. If you are unsure what to do until then, ask your doctor or other care provider.   montelukast 10 MG tablet Commonly known as:  SINGULAIR Take 10 mg by mouth at bedtime.   pantoprazole 40 MG tablet Commonly known as:  PROTONIX Take 1 tablet (40 mg total) by mouth daily at 6 (six) AM. Start taking on:  11/28/2017      Follow-up Information    Rica Koyanagi, MD Follow up in 2 week(s).   Specialty:  Geriatric Medicine Why:  Hospital Follow Up and check CBC and stool hemoccult Contact information: [place] [address] [phone]          Allergies  Allergen Reactions  . Penicillins Itching and Other (See Comments)    On arms only. Has patient had a PCN reaction causing immediate rash, facial/tongue/throat swelling, SOB or lightheadedness with hypotension: No Has patient had a PCN reaction causing severe rash involving mucus membranes  or skin necrosis: No Has patient had a PCN reaction that required hospitalization No Has patient had a PCN reaction occurring within the last 10 years: No If all of the above answers are "NO", then may proceed with Cephalosporin use.    Allergies as of 11/27/2017      Reactions   Penicillins Itching, Other (See Comments)   On arms only. Has patient had a PCN reaction causing immediate rash, facial/tongue/throat swelling, SOB or lightheadedness with hypotension: No Has patient had a PCN reaction causing severe rash involving mucus membranes or skin necrosis: No Has patient had a PCN reaction that required hospitalization No Has patient had a PCN reaction occurring within the last 10 years: No If all of the above answers are "NO", then may proceed with Cephalosporin use.      Medication List    STOP taking these medications   HYDROcodone-acetaminophen 5-325 MG tablet Commonly known as:  NORCO/VICODIN   valsartan 160 MG tablet Commonly known as:  DIOVAN     TAKE these medications   Adjustable Aluminum Cane Misc Use the cane as needed for weight bearing   allopurinol 100 MG tablet Commonly known as:  ZYLOPRIM Take 100 mg by mouth daily.   amLODipine 5 MG tablet Commonly known as:  NORVASC Take 1 tablet (5 mg total) by mouth daily. Start taking on:  11/29/2017 What changed:    medication strength  how much to take  These instructions start on 11/29/2017. If you are unsure what to do until then, ask your doctor or other care provider.   aspirin EC 81 MG tablet Take 1 tablet (81 mg total) by mouth every morning. Start taking on:  12/03/2017 What changed:  These instructions start on 12/03/2017. If you are unsure what to do until then, ask your doctor or other care provider.   donepezil 5 MG tablet Commonly known as:  ARICEPT Take 2 tablets (10 mg total) by mouth at bedtime.   ferrous sulfate 325 (65 FE) MG tablet Take 325 mg by mouth daily with breakfast.    finasteride 5 MG tablet Commonly known as:  PROSCAR Take 5 mg by mouth daily.   furosemide 20 MG tablet Commonly known as:  LASIX Take 1 tablet (20  mg total) by mouth daily. Start taking on:  11/29/2017 What changed:    medication strength  how much to take  when to take this  These instructions start on 11/29/2017. If you are unsure what to do until then, ask your doctor or other care provider.   labetalol 100 MG tablet Commonly known as:  NORMODYNE Take 0.5 tablets (50 mg total) by mouth 2 (two) times daily. Hold for SBP less than 120 or HR less than 60 Start taking on:  11/29/2017 What changed:    medication strength  how much to take  additional instructions  These instructions start on 11/29/2017. If you are unsure what to do until then, ask your doctor or other care provider.   montelukast 10 MG tablet Commonly known as:  SINGULAIR Take 10 mg by mouth at bedtime.   pantoprazole 40 MG tablet Commonly known as:  PROTONIX Take 1 tablet (40 mg total) by mouth daily at 6 (six) AM. Start taking on:  11/28/2017       Procedures/Studies: Dg Chest 2 View  Result Date: 11/25/2017 CLINICAL DATA:  Weakness EXAM: CHEST - 2 VIEW COMPARISON:  Chest x-rays dated 10/18/2017 and 09/06/2017. FINDINGS: Heart size and mediastinal contours are stable. Lungs are clear. No pleural effusion or pneumothorax seen. RIGHT-sided central catheter is stable in position with tip at the level of the lower SVC. No acute or suspicious osseous finding. IMPRESSION: No active cardiopulmonary disease. Electronically Signed   By: Franki Cabot [name]   On: 11/25/2017 18:51   Vas US Duplex Dialysis Access (avf, Avg)  Result Date: 11/22/2017 DIALYSIS ACCESS Reason for Exam: Unable to dialyze through AVF/AVG. Access Site: Left Upper Extremity. Access Type: Upper arm brachial axillary 4-44mm AV graft. History: Failed basilic vein fistula. Performing Technologist: Burley Saver RVT  Examination Guidelines:  A complete evaluation includes B-mode imaging, spectral Doppler, color Doppler, and power Doppler as needed of all accessible portions of each vessel. Unilateral testing is considered an integral part of a complete examination. Limited examinations for reoccurring indications may be performed as noted.  Findings:   +--------------------+----------+-----------------+-------------+ AVG                 PSV (cm/s)Flow Vol (mL/min)  Describe    +--------------------+----------+-----------------+-------------+ Native artery inflow   250          1903                     +--------------------+----------+-----------------+-------------+ Arterial anastomosis   591                                   +--------------------+----------+-----------------+-------------+ Prox graft             493                                   +--------------------+----------+-----------------+-------------+ Mid graft              160                     0.49 cm depth +--------------------+----------+-----------------+-------------+ Distal graft           158                                   +--------------------+----------+-----------------+-------------+  Venous anastomosis     149                                   +--------------------+----------+-----------------+-------------+ Venous outflow         179                                   +--------------------+----------+-----------------+-------------+  Summary: Patent arteriovenous graft.  Elevated velocities noted in the distal upper arm near the arterial anastomosis, possibly due to vessel diameter.  *See table(s) above for measurements and observations.  Diagnosing physician: Curt Jews MD Electronically signed by Curt Jews MD on 11/22/2017 at 2:54:09 PM.   --------------------------------------------------------------------------------   Final      Subjective: Patient without complaints, feels much better after transfusions.  No further emesis  or bleeding noted.  Discharge Exam: Vitals:   11/27/17 0850 11/27/17 0855  BP: 121/62 (!) 126/58  Pulse: 74 76  Resp: 18 20  Temp:    SpO2: 100% 100%   Vitals:   11/27/17 0840 11/27/17 0845 11/27/17 0850 11/27/17 0855  BP: 131/64 (!) 128/57 121/62 (!) 126/58  Pulse: 79 77 74 76  Resp: (!) 22 19 18 20   Temp:      TempSrc:      SpO2: 100% 100% 100% 100%  Weight:      Height:       General exam: awake, alert, NAD, cooperative.   Respiratory system: Clear. No increased work of breathing. Cardiovascular system: S1 & S2 heard, RRR. No JVD, murmurs, gallops, clicks or pedal edema. Gastrointestinal system: Abdomen is nondistended, soft and nontender. Normal bowel sounds heard. Central nervous system: Alert and oriented. No focal neurological deficits. Extremities: no CCE.   The results of significant diagnostics from this hospitalization (including imaging, microbiology, ancillary and laboratory) are listed below for reference.     Microbiology: Recent Results (from the past 240 hour(s))  MRSA PCR Screening     Status: None   Collection Time: 11/26/17 12:22 AM  Result Value Ref Range Status   MRSA by PCR NEGATIVE NEGATIVE Final    Comment:        The GeneXpert MRSA Assay (FDA approved for NASAL specimens only), is one component of a comprehensive MRSA colonization surveillance program. It is not intended to diagnose MRSA infection nor to guide or monitor treatment for MRSA infections. Performed at [facility], [address] [place], [place] [address]      Labs: BNP (last 3 results) No results for input(s): BNP in the last 8760 hours. Basic Metabolic Panel: Recent Labs  Lab 11/25/17 1807 11/26/17 0612 11/26/17 0711  NA 136 137  --   K 2.9* 2.9*  --   CL 94* 97*  --   CO2 30 31  --   GLUCOSE 124* 95  --   BUN 11 16  --   CREATININE 2.01* 3.09*  --   CALCIUM 8.6* 8.9  --   MG  --   --  1.8   Liver Function Tests: Recent Labs  Lab 11/25/17 1807  AST 26   ALT 29  ALKPHOS 107  BILITOT 0.4  PROT 6.7  ALBUMIN 3.3*   No results for input(s): LIPASE, AMYLASE in the last 168 hours. No results for input(s): AMMONIA in the last 168 hours. CBC: Recent Labs  Lab 11/25/17 1807 11/26/17 0612  11/26/17 1448 11/27/17 0934  WBC 8.3 7.1  --   --   NEUTROABS 5.9  --   --   --   HGB 6.4* 7.0* 8.3* 8.0*  HCT 21.1* 22.3* 26.9* 26.1*  MCV 90.2 89.2  --   --   PLT 342 286  --   --    Cardiac Enzymes: Recent Labs  Lab 11/25/17 1807  TROPONINI <0.03   BNP: Invalid input(s): POCBNP CBG: No results for input(s): GLUCAP in the last 168 hours. D-Dimer No results for input(s): DDIMER in the last 72 hours. Hgb A1c No results for input(s): HGBA1C in the last 72 hours. Lipid Profile No results for input(s): CHOL, HDL, LDLCALC, TRIG, CHOLHDL, LDLDIRECT in the last 72 hours. Thyroid function studies No results for input(s): TSH, T4TOTAL, T3FREE, THYROIDAB in the last 72 hours.  Invalid input(s): FREET3 Anemia work up Recent Labs    11/25/17 1800 11/25/17 1807  VITAMINB12  --  322  FOLATE 18.1  --   FERRITIN  --  1,363*  TIBC  --  255  IRON  --  184*  RETICCTPCT 2.1  --    Urinalysis    Component Value Date/Time   COLORURINE YELLOW 09/06/2017 1600   APPEARANCEUR CLEAR 09/06/2017 1600   LABSPEC 1.011 09/06/2017 1600   PHURINE 5.0 09/06/2017 1600   GLUCOSEU NEGATIVE 09/06/2017 1600   HGBUR MODERATE (A) 09/06/2017 1600   BILIRUBINUR NEGATIVE 09/06/2017 1600   KETONESUR NEGATIVE 09/06/2017 1600   PROTEINUR 100 (A) 09/06/2017 1600   UROBILINOGEN 0.2 02/08/2008 0204   NITRITE NEGATIVE 09/06/2017 1600   LEUKOCYTESUR NEGATIVE 09/06/2017 1600   Sepsis Labs Invalid input(s): PROCALCITONIN,  WBC,  LACTICIDVEN Microbiology Recent Results (from the past 240 hour(s))  MRSA PCR Screening     Status: None   Collection Time: 11/26/17 12:22 AM  Result Value Ref Range Status   MRSA by PCR NEGATIVE NEGATIVE Final    Comment:        The  GeneXpert MRSA Assay (FDA approved for NASAL specimens only), is one component of a comprehensive MRSA colonization surveillance program. It is not intended to diagnose MRSA infection nor to guide or monitor treatment for MRSA infections. Performed at [facility] [address] [place], [place] [address]    Time coordinating discharge:   SIGNED:  Irwin Brakeman, MD  [facility] 11/27/2017, 10:25 AM Pager [phone]  If 7PM-7AM, please contact night-coverage [email or web address] Password [email or web address]

## 2017-11-27 NOTE — Progress Notes (Signed)
Called report to [place] at [facility], [place].  All questions answered.

## 2017-11-27 NOTE — Progress Notes (Signed)
Brief EGD note:  Small sliding hiatal hernia. 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 Ablated with APC. Small focus of gastritis and body along the posterior wall possibly due to aspirin.

## 2017-11-27 NOTE — Progress Notes (Signed)
11/27/2017 7:06 PM  I received a call from Dr Laural Golden that patient's son called him and told him that patient is on a modified temporary dialysis that they  hadn't informed us about.  He was going to get HD on Sunday and Tues because of the holiday.  We had been told that he was on M,W,F HD schedule and he was going to resume his regular HD schedule tomorrow.   Dr. Laural Golden  is going to tell son to bring patient back to be be admitted for HD tomorrow if he is unable to get his regular HD treatment tomorrow.  I am happy to admit him to get his treatment in hospital and will consult nephrology to see him. Hopefully he will be able to have his HD tomorrow and Wednesday and Friday as regularly scheduled.   Murvin Natal MD

## 2017-11-27 NOTE — Discharge Instructions (Signed)
Please follow-up with your primary care physician in 2 weeks to have your CBC rechecked and to have a stool Hemoccult test done. Please hold aspirin for 5 days before restarting. Seek medical care or return to ER if symptoms come back, worsen or new problems develop.  Please resume your regular hemodialysis schedule tomorrow and go have your dialysis treatment as scheduled.  Please schedule a colonoscopy with GI if you have a positive hemoccult when tested in 2 weeks.     Anemia Anemia is a condition in which you do not have enough red blood cells or hemoglobin. Hemoglobin is a substance in red blood cells that carries oxygen. When you do not have enough red blood cells or hemoglobin (are anemic), your body cannot get enough oxygen and your organs may not work properly. As a result, you may feel very tired or have other problems. What are the causes? Common causes of anemia include:  Excessive bleeding. Anemia can be caused by excessive bleeding inside or outside the body, including bleeding from the intestine or from periods in women.  Poor nutrition.  Long-lasting (chronic) kidney, thyroid, and liver disease.  Bone marrow disorders.  Cancer and treatments for cancer.  HIV (human immunodeficiency virus) and AIDS (acquired immunodeficiency syndrome).  Treatments for HIV and AIDS.  Spleen problems.  Blood disorders.  Infections, medicines, and autoimmune disorders that destroy red blood cells.  What are the signs or symptoms? Symptoms of this condition include:  Minor weakness.  Dizziness.  Headache.  Feeling heartbeats that are irregular or faster than normal (palpitations).  Shortness of breath, especially with exercise.  Paleness.  Cold sensitivity.  Indigestion.  Nausea.  Difficulty sleeping.  Difficulty concentrating.  Symptoms may occur suddenly or develop slowly. If your anemia is mild, you may not have symptoms. How is this diagnosed? This condition  is diagnosed based on:  Blood tests.  Your medical history.  A physical exam.  Bone marrow biopsy.  Your health care provider may also check your stool (feces) for blood and may do additional testing to look for the cause of your bleeding. You may also have other tests, including:  Imaging tests, such as a CT scan or MRI.  Endoscopy.  Colonoscopy.  How is this treated? Treatment for this condition depends on the cause. If you continue to lose a lot of blood, you may need to be treated at a hospital. Treatment may include:  Taking supplements of iron, vitamin M35, or folic acid.  Taking a hormone medicine (erythropoietin) that can help to stimulate red blood cell growth.  Having a blood transfusion. This may be needed if you lose a lot of blood.  Making changes to your diet.  Having surgery to remove your spleen.  Follow these instructions at home:  Take over-the-counter and prescription medicines only as told by your health care provider.  Take supplements only as told by your health care provider.  Follow any diet instructions that you were given.  Keep all follow-up visits as told by your health care provider. This is important. Contact a health care provider if:  You develop new bleeding anywhere in the body. Get help right away if:  You are very weak.  You are short of breath.  You have pain in your abdomen or chest.  You are dizzy or feel faint.  You have trouble concentrating.  You have bloody or black, tarry stools.  You vomit repeatedly or you vomit up blood. Summary  Anemia is a condition  in which you do not have enough red blood cells or enough of a substance in your red blood cells that carries oxygen (hemoglobin).  Symptoms may occur suddenly or develop slowly.  If your anemia is mild, you may not have symptoms.  This condition is diagnosed with blood tests as well as a medical history and physical exam. Other tests may be  needed.  Treatment for this condition depends on the cause of the anemia. This information is not intended to replace advice given to you by your health care provider. Make sure you discuss any questions you have with your health care provider. Document Released: 01/29/2004 Document Revised: 01/23/2016 Document Reviewed: 01/23/2016 Elsevier Interactive Patient Education  [employer].

## 2017-11-28 ENCOUNTER — Ambulatory Visit: Payer: Medicare HMO | Admitting: Family Medicine

## 2017-11-28 DIAGNOSIS — D509 Iron deficiency anemia, unspecified: Secondary | ICD-10-CM | POA: Diagnosis not present

## 2017-11-28 DIAGNOSIS — Z79899 Other long term (current) drug therapy: Secondary | ICD-10-CM | POA: Diagnosis not present

## 2017-11-28 DIAGNOSIS — D649 Anemia, unspecified: Secondary | ICD-10-CM | POA: Diagnosis not present

## 2017-11-28 DIAGNOSIS — M109 Gout, unspecified: Secondary | ICD-10-CM | POA: Diagnosis not present

## 2017-11-28 DIAGNOSIS — E039 Hypothyroidism, unspecified: Secondary | ICD-10-CM | POA: Diagnosis not present

## 2017-11-28 DIAGNOSIS — E119 Type 2 diabetes mellitus without complications: Secondary | ICD-10-CM | POA: Diagnosis not present

## 2017-11-28 DIAGNOSIS — E785 Hyperlipidemia, unspecified: Secondary | ICD-10-CM | POA: Diagnosis not present

## 2017-11-28 LAB — H. PYLORI ANTIBODY, IGG: H Pylori IgG: 0.2 Index Value (ref 0.00–0.79)

## 2017-11-28 LAB — GLUCOSE, CAPILLARY: GLUCOSE-CAPILLARY: 85 mg/dL (ref 70–99)

## 2017-11-28 NOTE — Progress Notes (Signed)
11/28/2017 12:29 PM  I called and spoke with patient's nurse at [facility] and she informed me that patient was having his hemodialysis today.   Murvin Natal MD

## 2017-11-29 LAB — BPAM RBC
Blood Product Expiration Date: 201912202359
Blood Product Expiration Date: 201912202359
Blood Product Expiration Date: 201912252359
ISSUE DATE / TIME: 201911222125
ISSUE DATE / TIME: 201911230957
UNIT TYPE AND RH: 5100
Unit Type and Rh: 5100
Unit Type and Rh: 5100

## 2017-11-29 LAB — TYPE AND SCREEN
ABO/RH(D): O POS
ANTIBODY SCREEN: NEGATIVE
UNIT DIVISION: 0
UNIT DIVISION: 0
Unit division: 0

## 2017-12-03 DIAGNOSIS — N186 End stage renal disease: Secondary | ICD-10-CM | POA: Diagnosis not present

## 2017-12-03 DIAGNOSIS — I129 Hypertensive chronic kidney disease with stage 1 through stage 4 chronic kidney disease, or unspecified chronic kidney disease: Secondary | ICD-10-CM | POA: Diagnosis not present

## 2017-12-03 DIAGNOSIS — Z992 Dependence on renal dialysis: Secondary | ICD-10-CM | POA: Diagnosis not present

## 2017-12-05 ENCOUNTER — Encounter (HOSPITAL_COMMUNITY): Payer: Self-pay | Admitting: Internal Medicine

## 2017-12-05 DIAGNOSIS — N186 End stage renal disease: Secondary | ICD-10-CM | POA: Diagnosis not present

## 2017-12-05 DIAGNOSIS — Z23 Encounter for immunization: Secondary | ICD-10-CM | POA: Diagnosis not present

## 2017-12-05 DIAGNOSIS — D631 Anemia in chronic kidney disease: Secondary | ICD-10-CM | POA: Diagnosis not present

## 2017-12-05 DIAGNOSIS — E1129 Type 2 diabetes mellitus with other diabetic kidney complication: Secondary | ICD-10-CM | POA: Diagnosis not present

## 2017-12-05 DIAGNOSIS — N2581 Secondary hyperparathyroidism of renal origin: Secondary | ICD-10-CM | POA: Diagnosis not present

## 2017-12-05 DIAGNOSIS — D689 Coagulation defect, unspecified: Secondary | ICD-10-CM | POA: Diagnosis not present

## 2017-12-06 ENCOUNTER — Telehealth: Payer: Self-pay | Admitting: *Deleted

## 2017-12-06 NOTE — Telephone Encounter (Signed)
Received call from patient son, Quillian Quince.   Reports that he requires FL2 for respite care for the end of the month. FL2 to be completed.

## 2017-12-06 NOTE — Telephone Encounter (Signed)
Okay to complete?

## 2017-12-07 NOTE — Telephone Encounter (Signed)
Call placed to patient son. [place].

## 2017-12-08 NOTE — Telephone Encounter (Signed)
Call placed to patient son. [place].

## 2017-12-08 NOTE — Telephone Encounter (Signed)
Call placed to patient and patient son made aware. FL2 completed and awaiting signature.

## 2017-12-12 ENCOUNTER — Encounter (HOSPITAL_COMMUNITY): Payer: Self-pay | Admitting: Emergency Medicine

## 2017-12-12 ENCOUNTER — Emergency Department (HOSPITAL_COMMUNITY): Payer: Medicare HMO

## 2017-12-12 ENCOUNTER — Observation Stay (HOSPITAL_COMMUNITY)
Admission: EM | Admit: 2017-12-12 | Discharge: 2017-12-14 | Disposition: A | Discharge status: Home / Self Care | Payer: Medicare HMO | Attending: Internal Medicine | Admitting: Internal Medicine

## 2017-12-12 ENCOUNTER — Other Ambulatory Visit: Payer: Self-pay

## 2017-12-12 DIAGNOSIS — D638 Anemia in other chronic diseases classified elsewhere: Secondary | ICD-10-CM | POA: Diagnosis present

## 2017-12-12 DIAGNOSIS — I120 Hypertensive chronic kidney disease with stage 5 chronic kidney disease or end stage renal disease: Secondary | ICD-10-CM | POA: Insufficient documentation

## 2017-12-12 DIAGNOSIS — I10 Essential (primary) hypertension: Secondary | ICD-10-CM | POA: Diagnosis present

## 2017-12-12 DIAGNOSIS — M109 Gout, unspecified: Secondary | ICD-10-CM

## 2017-12-12 DIAGNOSIS — Z87891 Personal history of nicotine dependence: Secondary | ICD-10-CM | POA: Insufficient documentation

## 2017-12-12 DIAGNOSIS — N400 Enlarged prostate without lower urinary tract symptoms: Secondary | ICD-10-CM | POA: Diagnosis not present

## 2017-12-12 DIAGNOSIS — I639 Cerebral infarction, unspecified: Secondary | ICD-10-CM

## 2017-12-12 DIAGNOSIS — F0390 Unspecified dementia without behavioral disturbance: Secondary | ICD-10-CM | POA: Diagnosis not present

## 2017-12-12 DIAGNOSIS — E876 Hypokalemia: Secondary | ICD-10-CM | POA: Insufficient documentation

## 2017-12-12 DIAGNOSIS — D649 Anemia, unspecified: Secondary | ICD-10-CM | POA: Insufficient documentation

## 2017-12-12 DIAGNOSIS — Z79899 Other long term (current) drug therapy: Secondary | ICD-10-CM | POA: Diagnosis not present

## 2017-12-12 DIAGNOSIS — R531 Weakness: Secondary | ICD-10-CM | POA: Diagnosis not present

## 2017-12-12 DIAGNOSIS — R29898 Other symptoms and signs involving the musculoskeletal system: Secondary | ICD-10-CM | POA: Diagnosis present

## 2017-12-12 DIAGNOSIS — E785 Hyperlipidemia, unspecified: Secondary | ICD-10-CM | POA: Insufficient documentation

## 2017-12-12 DIAGNOSIS — N186 End stage renal disease: Secondary | ICD-10-CM | POA: Insufficient documentation

## 2017-12-12 DIAGNOSIS — E119 Type 2 diabetes mellitus without complications: Secondary | ICD-10-CM

## 2017-12-12 DIAGNOSIS — K219 Gastro-esophageal reflux disease without esophagitis: Secondary | ICD-10-CM | POA: Diagnosis not present

## 2017-12-12 DIAGNOSIS — E1122 Type 2 diabetes mellitus with diabetic chronic kidney disease: Secondary | ICD-10-CM | POA: Diagnosis not present

## 2017-12-12 DIAGNOSIS — J45909 Unspecified asthma, uncomplicated: Secondary | ICD-10-CM | POA: Insufficient documentation

## 2017-12-12 DIAGNOSIS — R69 Illness, unspecified: Secondary | ICD-10-CM | POA: Diagnosis not present

## 2017-12-12 DIAGNOSIS — R509 Fever, unspecified: Secondary | ICD-10-CM

## 2017-12-12 LAB — I-STAT CHEM 8, ED
BUN: 14 mg/dL (ref 8–23)
Calcium, Ion: 1 mmol/L — ABNORMAL LOW (ref 1.15–1.40)
Chloride: 94 mmol/L — ABNORMAL LOW (ref 98–111)
Creatinine, Ser: 2.8 mg/dL — ABNORMAL HIGH (ref 0.61–1.24)
Glucose, Bld: 181 mg/dL — ABNORMAL HIGH (ref 70–99)
HCT: 26 % — ABNORMAL LOW (ref 39.0–52.0)
HEMOGLOBIN: 8.8 g/dL — AB (ref 13.0–17.0)
Potassium: 3.3 mmol/L — ABNORMAL LOW (ref 3.5–5.1)
Sodium: 135 mmol/L (ref 135–145)
TCO2: 35 mmol/L — ABNORMAL HIGH (ref 22–32)

## 2017-12-12 LAB — DIFFERENTIAL
Abs Immature Granulocytes: 0.04 10*3/uL (ref 0.00–0.07)
BASOS ABS: 0 10*3/uL (ref 0.0–0.1)
Basophils Relative: 0 %
EOS PCT: 1 %
Eosinophils Absolute: 0.1 10*3/uL (ref 0.0–0.5)
Immature Granulocytes: 0 %
LYMPHS PCT: 11 %
Lymphs Abs: 1 10*3/uL (ref 0.7–4.0)
Monocytes Absolute: 1.1 10*3/uL — ABNORMAL HIGH (ref 0.1–1.0)
Monocytes Relative: 11 %
Neutro Abs: 7.5 10*3/uL (ref 1.7–7.7)
Neutrophils Relative %: 77 %

## 2017-12-12 LAB — COMPREHENSIVE METABOLIC PANEL
ALT: 19 U/L (ref 0–44)
AST: 20 U/L (ref 15–41)
Albumin: 3.1 g/dL — ABNORMAL LOW (ref 3.5–5.0)
Alkaline Phosphatase: 99 U/L (ref 38–126)
Anion gap: 12 (ref 5–15)
BUN: 15 mg/dL (ref 8–23)
CO2: 28 mmol/L (ref 22–32)
Calcium: 8.5 mg/dL — ABNORMAL LOW (ref 8.9–10.3)
Chloride: 97 mmol/L — ABNORMAL LOW (ref 98–111)
Creatinine, Ser: 2.86 mg/dL — ABNORMAL HIGH (ref 0.61–1.24)
GFR calc Af Amer: 22 mL/min — ABNORMAL LOW (ref >60)
GFR, EST NON AFRICAN AMERICAN: 19 mL/min — AB (ref >60)
Glucose, Bld: 183 mg/dL — ABNORMAL HIGH (ref 70–99)
POTASSIUM: 2.8 mmol/L — AB (ref 3.5–5.1)
Sodium: 137 mmol/L (ref 135–145)
Total Bilirubin: 0.6 mg/dL (ref 0.3–1.2)
Total Protein: 6.6 g/dL (ref 6.5–8.1)

## 2017-12-12 LAB — I-STAT TROPONIN, ED: TROPONIN I, POC: 0.01 ng/mL (ref 0.00–0.08)

## 2017-12-12 LAB — CBC
HCT: 27.4 % — ABNORMAL LOW (ref 39.0–52.0)
Hemoglobin: 8.7 g/dL — ABNORMAL LOW (ref 13.0–17.0)
MCH: 28.2 pg (ref 26.0–34.0)
MCHC: 31.8 g/dL (ref 30.0–36.0)
MCV: 88.7 fL (ref 80.0–100.0)
Platelets: 258 10*3/uL (ref 150–400)
RBC: 3.09 MIL/uL — ABNORMAL LOW (ref 4.22–5.81)
RDW: 16.6 % — ABNORMAL HIGH (ref 11.5–15.5)
WBC: 9.7 10*3/uL (ref 4.0–10.5)
nRBC: 0 % (ref 0.0–0.2)

## 2017-12-12 LAB — POTASSIUM: Potassium: 2.8 mmol/L — ABNORMAL LOW (ref 3.5–5.1)

## 2017-12-12 LAB — CBG MONITORING, ED: Glucose-Capillary: 161 mg/dL — ABNORMAL HIGH (ref 70–99)

## 2017-12-12 LAB — MAGNESIUM: Magnesium: 1.9 mg/dL (ref 1.7–2.4)

## 2017-12-12 LAB — PROTIME-INR
INR: 1.03
PROTHROMBIN TIME: 13.4 s (ref 11.4–15.2)

## 2017-12-12 LAB — APTT: aPTT: 37 seconds — ABNORMAL HIGH (ref 24–36)

## 2017-12-12 MED ORDER — HEPARIN SODIUM (PORCINE) 5000 UNIT/ML IJ SOLN
5000.0000 [IU] | Freq: Three times a day (TID) | INTRAMUSCULAR | Status: DC
  Administered 2017-12-12 – 2017-12-14 (×6): 5000 [IU] via SUBCUTANEOUS
  Filled 2017-12-12 (×6): qty 1

## 2017-12-12 MED ORDER — ACETAMINOPHEN 160 MG/5ML PO SOLN: 650.0000 mg | ORAL | Status: DC | PRN

## 2017-12-12 MED ORDER — FERROUS SULFATE 325 (65 FE) MG PO TABS
325.0000 mg | ORAL_TABLET | Freq: Every day | ORAL | Status: DC
  Administered 2017-12-14: 325 mg via ORAL
  Filled 2017-12-12 (×2): qty 1

## 2017-12-12 MED ORDER — POTASSIUM CHLORIDE CRYS ER 20 MEQ PO TBCR
40.0000 meq | EXTENDED_RELEASE_TABLET | Freq: Once | ORAL | Status: AC
  Administered 2017-12-12: 40 meq via ORAL
  Filled 2017-12-12: qty 2

## 2017-12-12 MED ORDER — FINASTERIDE 5 MG PO TABS
5.0000 mg | ORAL_TABLET | Freq: Every day | ORAL | Status: DC
  Administered 2017-12-14: 5 mg via ORAL
  Filled 2017-12-12 (×4): qty 1

## 2017-12-12 MED ORDER — ALLOPURINOL 100 MG PO TABS
100.0000 mg | ORAL_TABLET | Freq: Every day | ORAL | Status: DC
  Administered 2017-12-13 – 2017-12-14 (×2): 100 mg via ORAL
  Filled 2017-12-12 (×2): qty 1

## 2017-12-12 MED ORDER — PANTOPRAZOLE SODIUM 40 MG PO TBEC
40.0000 mg | DELAYED_RELEASE_TABLET | Freq: Every day | ORAL | Status: DC
  Administered 2017-12-12 – 2017-12-14 (×2): 40 mg via ORAL
  Filled 2017-12-12 (×3): qty 1

## 2017-12-12 MED ORDER — ATORVASTATIN CALCIUM 40 MG PO TABS
40.0000 mg | ORAL_TABLET | Freq: Every day | ORAL | Status: DC
  Administered 2017-12-12 – 2017-12-13 (×2): 40 mg via ORAL
  Filled 2017-12-12 (×2): qty 1

## 2017-12-12 MED ORDER — ACETAMINOPHEN 325 MG PO TABS
650.0000 mg | ORAL_TABLET | ORAL | Status: DC | PRN
  Administered 2017-12-12: 650 mg via ORAL
  Filled 2017-12-12: qty 2

## 2017-12-12 MED ORDER — ALBUTEROL SULFATE (2.5 MG/3ML) 0.083% IN NEBU: 2.5000 mg | INHALATION_SOLUTION | Freq: Four times a day (QID) | RESPIRATORY_TRACT | Status: DC | PRN

## 2017-12-12 MED ORDER — ASPIRIN EC 325 MG PO TBEC
325.0000 mg | DELAYED_RELEASE_TABLET | Freq: Every day | ORAL | Status: DC
  Administered 2017-12-12 – 2017-12-14 (×3): 325 mg via ORAL
  Filled 2017-12-12 (×3): qty 1

## 2017-12-12 MED ORDER — DONEPEZIL HCL 5 MG PO TABS
10.0000 mg | ORAL_TABLET | Freq: Every day | ORAL | Status: DC
  Administered 2017-12-12 – 2017-12-13 (×2): 10 mg via ORAL
  Filled 2017-12-12 (×2): qty 2

## 2017-12-12 MED ORDER — ACETAMINOPHEN 650 MG RE SUPP: 650.0000 mg | RECTAL | Status: DC | PRN

## 2017-12-12 MED ORDER — MONTELUKAST SODIUM 10 MG PO TABS
10.0000 mg | ORAL_TABLET | Freq: Every day | ORAL | Status: DC
  Administered 2017-12-12 – 2017-12-13 (×2): 10 mg via ORAL
  Filled 2017-12-12 (×2): qty 1

## 2017-12-12 MED ORDER — STROKE: EARLY STAGES OF RECOVERY BOOK
Freq: Once | Status: AC
  Administered 2017-12-12: 23:00:00
  Filled 2017-12-12: qty 1

## 2017-12-12 NOTE — ED Triage Notes (Signed)
Patient reports L arm weakness that started yesterday. Son reports patient's balance is off.

## 2017-12-12 NOTE — H&P (Signed)
History and Physical    BLESS BELSHE JKD:[id number] DOB: 07-02-1930 DOA: 12/12/2017  PCP: [name], MD Patient coming from: Home  Chief Complaint: Left arm weakness  HPI: Miguel Tucker is a 84 y.o. male with medical history significant of asthma, ESRD on HD, dementia, type 2 diabetes, hypertension, hyperlipidemia presenting to the hospital for evaluation of left arm weakness.  Patient reports having weakness in his left upper extremity.  Denies having any loss of sensation or tingling.  Per son at bedside, patient was last seen in his usual state of health yesterday evening at around 7 PM.  This morning when patient's son went to his house to take him for dialysis, he noted that the patient could not lift his left arm.  Patient did go for his full session of dialysis.  Son also noticed that patient had difficulty using his walker to ambulate as he could not use his left upper extremity to grasp the bar.  He did not notice any leg weakness or dragging of leg.  Patient denies any prior history of stroke.  ED Course: Vitals stable on arrival.  I-STAT troponin negative and EKG not suggestive of ACS.  Potassium 3.3.  Head CT showing no acute finding.  Review of Systems: As per HPI otherwise 10 point review of systems negative.  Past Medical History:  Diagnosis Date  . Asthma   . Chronic kidney disease    on dialysis  . Chronic knee pain   . Dementia ([place])   . Dementia ([place])   . Diabetes mellitus    type 2  . Frequent urination at night   . Gout   . Hard of hearing   . High cholesterol   . Hypertension   . Hypokalemia   . Radicular pain of left lower extremity     Past Surgical History:  Procedure Laterality Date  . APPENDECTOMY    . AV FISTULA PLACEMENT Left 10/18/2017   Procedure: INSERTION OF ARTERIOVENOUS (AV) GORE-TEX GRAFT LEFT UPPER  ARM;  Surgeon: Waynetta Sandy, MD;  Location: [place];  Service: Vascular;  Laterality: Left;  . BASCILIC VEIN  TRANSPOSITION Left 09/09/2015   Procedure: FIRST STAGE BRACHIAL VEIN TRANSPOSITION LEFT ARM;  Surgeon: Elam Dutch, MD;  Location: [place];  Service: Vascular;  Laterality: Left;  . BASCILIC VEIN TRANSPOSITION Left 02/20/2016   Procedure: LEFT ARM SECOND STAGE BASILIC VEIN TRANSPOSITION;  Surgeon: Rosetta Posner, MD;  Location: [place];  Service: Vascular;  Laterality: Left;  . ESOPHAGOGASTRODUODENOSCOPY N/A 11/27/2017   Procedure: ESOPHAGOGASTRODUODENOSCOPY (EGD);  Surgeon: Rogene Houston, MD;  [facility];  Service: Endoscopy;  Laterality: N/A;  . INSERTION OF DIALYSIS CATHETER Right 10/18/2017   Procedure: INSERTION OF TUNNELED  DIALYSIS CATHETER RIGHT INTERNAL JUGULAR;  Surgeon: Waynetta Sandy, MD;  Location: [place];  Service: Vascular;  Laterality: Right;  . OTHER SURGICAL HISTORY     testicular tumor- benign   . ROTATOR CUFF REPAIR Right   . WOUND DEBRIDEMENT  06/01/2011   Procedure: DEBRIDEMENT ABDOMINAL WOUND;  Surgeon: [name], MD;  Location[facility];  Service: General;  Laterality: N/A;  Remove Sutures      reports that he quit smoking about 42 years ago. He has never used smokeless tobacco. He reports that he does not drink alcohol or use drugs.  Allergies  Allergen Reactions  . Penicillins Itching and Other (See Comments)    On arms only. Has patient had a PCN  reaction causing immediate rash, facial/tongue/throat swelling, SOB or lightheadedness with hypotension: No Has patient had a PCN reaction causing severe rash involving mucus membranes or skin necrosis: No Has patient had a PCN reaction that required hospitalization No Has patient had a PCN reaction occurring within the last 10 years: No If all of the above answers are "NO", then may proceed with Cephalosporin use.     History reviewed. No pertinent family history.  Prior to Admission medications   Medication Sig Start Date End Date Taking? Authorizing Provider  allopurinol (ZYLOPRIM) 100 MG  tablet Take 100 mg by mouth daily.  07/23/15   [provider]  amLODipine (NORVASC) 5 MG tablet Take 1 tablet (5 mg total) by mouth daily. 11/29/17   Murlean Iba, MD  aspirin EC 81 MG tablet Take 1 tablet (81 mg total) by mouth every morning. 12/03/17   Johnson, Clanford L, MD  donepezil (ARICEPT) 5 MG tablet Take 2 tablets (10 mg total) by mouth at bedtime. 11/27/17   Johnson, Clanford L, MD  ferrous sulfate 325 (65 FE) MG tablet Take 325 mg by mouth daily with breakfast.    [provider]  finasteride (PROSCAR) 5 MG tablet Take 5 mg by mouth daily.    [provider]  furosemide (LASIX) 20 MG tablet Take 1 tablet (20 mg total) by mouth daily. 11/29/17   Johnson, Clanford L, MD  labetalol (NORMODYNE) 100 MG tablet Take 0.5 tablets (50 mg total) by mouth 2 (two) times daily. Hold for SBP less than 120 or HR less than 60 11/29/17   Johnson, Eldridge Dace, MD  Misc. Devices (ADJUSTABLE ALUMINUM CANE) MISC Use the cane as needed for weight bearing 06/22/17   Triplett, Tammy, PA-C  montelukast (SINGULAIR) 10 MG tablet Take 10 mg by mouth at bedtime.    [provider]  pantoprazole (PROTONIX) 40 MG tablet Take 1 tablet (40 mg total) by mouth daily at 6 (six) AM. 11/28/17   Murlean Iba, MD    Physical Exam: Vitals:   12/12/17 1830 12/12/17 1900 12/12/17 1930 12/12/17 2102  BP: (!) 144/64 140/68 (!) 144/63 (!) 146/61  Pulse: 89 95 94 95  Resp: 13 16 (!) 21   Temp:    (!) 100.9 F (38.3 C)  TempSrc:    Oral  SpO2: 93% 100% 98% 99%  Weight:      Height:        Physical Exam  Constitutional: He is oriented to person, place, and time. He appears well-developed and well-nourished. No distress.  HENT:  Head: Normocephalic.  Mouth/Throat: Oropharynx is clear and moist.  Eyes: Pupils are equal, round, and reactive to light. EOM are normal.  Neck: Neck supple.  Cardiovascular: Normal rate, regular rhythm and intact distal pulses.  Pulmonary/Chest:  Effort normal and breath sounds normal. No respiratory distress. He has no wheezes. He has no rales.  Abdominal: Soft. Bowel sounds are normal. He exhibits no distension. There is no tenderness. There is no guarding.  Musculoskeletal: He exhibits no edema.  Neurological: He is alert and oriented to person, place, and time. No cranial nerve deficit.  Speech fluent Tongue midline Strength 2 out of 5 in the left upper extremity and 5 out of 5 in the right upper extremity.  Strength 5 out of 5 in bilateral lower extremities.  Sensation to light touch intact throughout.  Skin: Skin is warm and dry. He is not diaphoretic.  Psychiatric: His behavior is normal.  Labs on Admission: I have personally reviewed following labs and imaging studies  CBC: Recent Labs  Lab 12/12/17 1800 12/12/17 1805  WBC 9.7  --   NEUTROABS 7.5  --   HGB 8.7* 8.8*  HCT 27.4* 26.0*  MCV 88.7  --   PLT 258  --    Basic Metabolic Panel: Recent Labs  Lab 12/12/17 1805 12/12/17 1925  NA 135 137  K 3.3* 2.8*  CL 94* 97*  CO2  --  28  GLUCOSE 181* 183*  BUN 14 15  CREATININE 2.80* 2.86*  CALCIUM  --  8.5*   GFR: Estimated Creatinine Clearance: 16.4 mL/min (A) (by C-G formula based on SCr of 2.86 mg/dL (H)). Liver Function Tests: Recent Labs  Lab 12/12/17 1925  AST 20  ALT 19  ALKPHOS 99  BILITOT 0.6  PROT 6.6  ALBUMIN 3.1*   No results for input(s): LIPASE, AMYLASE in the last 168 hours. No results for input(s): AMMONIA in the last 168 hours. Coagulation Profile: Recent Labs  Lab 12/12/17 1925  INR 1.03   Cardiac Enzymes: No results for input(s): CKTOTAL, CKMB, CKMBINDEX, TROPONINI in the last 168 hours. BNP (last 3 results) No results for input(s): PROBNP in the last 8760 hours. HbA1C: No results for input(s): HGBA1C in the last 72 hours. CBG: Recent Labs  Lab 12/12/17 1730  GLUCAP 161*   Lipid Profile: No results for input(s): CHOL, HDL, LDLCALC, TRIG, CHOLHDL, LDLDIRECT in the  last 72 hours. Thyroid Function Tests: No results for input(s): TSH, T4TOTAL, FREET4, T3FREE, THYROIDAB in the last 72 hours. Anemia Panel: No results for input(s): VITAMINB12, FOLATE, FERRITIN, TIBC, IRON, RETICCTPCT in the last 72 hours. Urine analysis:    Component Value Date/Time   COLORURINE YELLOW 09/06/2017 1600   APPEARANCEUR CLEAR 09/06/2017 1600   LABSPEC 1.011 09/06/2017 1600   PHURINE 5.0 09/06/2017 1600   GLUCOSEU NEGATIVE 09/06/2017 1600   HGBUR MODERATE (A) 09/06/2017 1600   BILIRUBINUR NEGATIVE 09/06/2017 1600   KETONESUR NEGATIVE 09/06/2017 1600   PROTEINUR 100 (A) 09/06/2017 1600   UROBILINOGEN 0.2 02/08/2008 0204   NITRITE NEGATIVE 09/06/2017 1600   LEUKOCYTESUR NEGATIVE 09/06/2017 1600    Radiological Exams on Admission: Ct Head Wo Contrast  Result Date: 12/12/2017 CLINICAL DATA:  Left arm weakness that started yesterday. History of dementia EXAM: CT HEAD WITHOUT CONTRAST TECHNIQUE: Contiguous axial images were obtained from the base of the skull through the vertex without intravenous contrast. COMPARISON:  09/06/2017 FINDINGS: Brain: No evidence of acute infarction, hemorrhage, hydrocephalus, extra-axial collection or mass lesion/mass effect. Atrophy with frontal predilection. Chronic small vessel ischemia with confluent low-density in the cerebral white matter. Vascular: No hyperdense vessel or unexpected calcification. Skull: Normal. Negative for fracture or focal lesion. Sinuses/Orbits: No acute finding. IMPRESSION: 1. No acute finding. 2. Frontal predominant atrophy. Electronically Signed   By: Monte Fantasia [name]   On: 12/12/2017 18:48    EKG: Independently reviewed.  Sinus rhythm, LAFB, LVH.  Similar to prior tracing.  Assessment/Plan Principal Problem:   Weakness of left upper extremity Active Problems:   Diabetes mellitus (HCC)   HTN (hypertension)   Anemia, chronic disease   Dementia (HCC)   Hypokalemia   ESRD (end stage renal disease) (HCC)    HLD (hyperlipidemia)   Gout   BPH (benign prostatic hyperplasia)   Asthma   GERD (gastroesophageal reflux disease)   Left upper extremity weakness Out of TPA window as noted to have left upper extremity weakness early this morning.  On exam, strength 2 out of 5 in the left upper extremity and 5 out of 5 in the right upper extremity.  Sensation to light touch intact.  Head CT showing no acute finding, however, there remains a high suspicion for acute CVA due to persistent focal motor deficit. -ED provider paged Dr. Merlene Laughter, waiting for call-back.  He also discussed the case with Dr.Kirkpatrick at [facility] who did not recommend transfer at this time; recommended stroke work-up and consulting neurology at [facility] in the morning. -Aspirin 325 mg daily -Lipitor 40 mg daily -MRI brain -Monitor on telemetry -Check A1c, lipid panel -Permissive hypertension -PT/OT/SLP.  Per nursing staff, passed bedside swallowing eval. -If MRI confirms stroke, order carotid Dopplers and echocardiogram in the morning.  Hypokalemia Potassium 2.8 on CMP and 3.3 on i-STAT chemistry.  EKG without acute changes.  Last dialysis session was today per family. -Replete potassium -Check magnesium level -Repeat potassium level this evening -Renal function panel in the morning  ESRD on HD MWF Last dialysis session was today.  Currently not volume overloaded on exam.  Potassium 2.8. -Replete potassium, check magnesium level -Renal function panel in the morning -Consult nephrology in the morning  Chronic anemia Hemoglobin 8.7, stable. -Continue home iron supplement  Hypertension Systolic currently in the 170Y and diastolic in the 17C. -Hold home antihypertensives in the setting of suspected CVA -Allow permissive hypertension up to 220/120  Hyperlipidemia Currently not on a statin. -Start Lipitor 40 mg daily in the setting of suspected CVA -Check lipid panel  Gout -Continue home allopurinol  BPH -Continue  home finasteride  Dementia -Continue home donezepil  Asthma -Stable.  No bronchospasm.  Continue home Singulair.  Albuterol as needed.  GERD -Continue PPI  Type 2 diabetes Currently diet controlled. -Check A1c -CBG checks  DVT prophylaxis: Subcutaneous heparin Code Status: Patient wishes to be full code. Family Communication: Son and wife at bedside. Disposition Plan: Anticipate discharge to home in 1 to 2 days.   Consults called: Neurology Admission status: Observation   Shela Leff MD [facility] Pager [phone]  If 7PM-7AM, please contact night-coverage [email or web address] Password [email or web address]  12/12/2017, 9:30 PM

## 2017-12-12 NOTE — ED Notes (Signed)
Upon arrival patient originally stated time last known well was yesterday at 1800. Upon further investigation patient's family states last known well today at 2.

## 2017-12-12 NOTE — ED Provider Notes (Addendum)
[facility] EMERGENCY DEPARTMENT Provider Note   CSN: [id number] Arrival date & time: 12/12/17  1656     History   Chief Complaint Chief Complaint  Patient presents with  . Extremity Weakness    HPI Miguel Tucker is a 84 y.o. male.  HPI  Level 5 caveat for dementia.  84 year old male with history of diabetes, dementia, hypertension and hyperlipidemia brought into the ER by family with chief complaint of ataxic gait and left-sided weakness.  Patient was last normal around 7 PM last night when he went to bed.  According to the family, they noted around 10:00 this morning the patient was having difficulty moving his left upper extremity and when he tries to ambulate he is unsteady.   Patient denies any complaints from his side.  There is no history of stroke.  Patient denies any headache, slurred speech, dizziness.  Past Medical History:  Diagnosis Date  . Asthma   . Chronic kidney disease    on dialysis  . Chronic knee pain   . Dementia ([place])   . Dementia ([place])   . Diabetes mellitus    type 2  . Frequent urination at night   . Gout   . Hard of hearing   . High cholesterol   . Hypertension   . Hypokalemia   . Radicular pain of left lower extremity     Patient Active Problem List   Diagnosis Date Noted  . Symptomatic anemia 11/25/2017  . Acute kidney injury superimposed on chronic kidney disease ([place])   . Generalized weakness 09/06/2017  . CKD (chronic kidney disease) stage 5, GFR less than 15 ml/min (HCC) 09/06/2017  . HTN (hypertension) 09/06/2017  . Anemia, chronic disease 09/06/2017  . Acute on chronic renal failure ([place]) 09/06/2017  . Dementia ([place]) 09/06/2017  . Onychomycosis 04/11/2017  . Hammer toe 04/11/2017  . Diabetes mellitus ([place]) 04/11/2017  . Osteoarthritis of knee 02/11/2017  . Difficulty in walking(719.7) 12/09/2011  . Radicular pain of left lower extremity 12/09/2011  . Wound healing, delayed 11/06/2010    Past Surgical History:    Procedure Laterality Date  . APPENDECTOMY    . AV FISTULA PLACEMENT Left 10/18/2017   Procedure: INSERTION OF ARTERIOVENOUS (AV) GORE-TEX GRAFT LEFT UPPER  ARM;  Surgeon: Waynetta Sandy, MD;  Location: [place];  Service: Vascular;  Laterality: Left;  . BASCILIC VEIN TRANSPOSITION Left 09/09/2015   Procedure: FIRST STAGE BRACHIAL VEIN TRANSPOSITION LEFT ARM;  Surgeon: Elam Dutch, MD;  Location: [place];  Service: Vascular;  Laterality: Left;  . BASCILIC VEIN TRANSPOSITION Left 02/20/2016   Procedure: LEFT ARM SECOND STAGE BASILIC VEIN TRANSPOSITION;  Surgeon: Rosetta Posner, MD;  Location: [place];  Service: Vascular;  Laterality: Left;  . ESOPHAGOGASTRODUODENOSCOPY N/A 11/27/2017   Procedure: ESOPHAGOGASTRODUODENOSCOPY (EGD);  Surgeon: Rogene Houston, MD;  [facility];  Service: Endoscopy;  Laterality: N/A;  . INSERTION OF DIALYSIS CATHETER Right 10/18/2017   Procedure: INSERTION OF TUNNELED  DIALYSIS CATHETER RIGHT INTERNAL JUGULAR;  Surgeon: Waynetta Sandy, MD;  Location: [place];  Service: Vascular;  Laterality: Right;  . OTHER SURGICAL HISTORY     testicular tumor- benign   . ROTATOR CUFF REPAIR Right   . WOUND DEBRIDEMENT  06/01/2011   Procedure: DEBRIDEMENT ABDOMINAL WOUND;  Surgeon: [name], MD;  [facility];  Service: General;  Laterality: N/A;  Remove Sutures         Home Medications    Prior to  Admission medications   Medication Sig Start Date End Date Taking? Authorizing Provider  allopurinol (ZYLOPRIM) 100 MG tablet Take 100 mg by mouth daily.  07/23/15   [provider]  amLODipine (NORVASC) 5 MG tablet Take 1 tablet (5 mg total) by mouth daily. 11/29/17   Murlean Iba, MD  aspirin EC 81 MG tablet Take 1 tablet (81 mg total) by mouth every morning. 12/03/17   Johnson, Clanford L, MD  donepezil (ARICEPT) 5 MG tablet Take 2 tablets (10 mg total) by mouth at bedtime. 11/27/17   Johnson, Clanford L, MD  ferrous sulfate 325  (65 FE) MG tablet Take 325 mg by mouth daily with breakfast.    [provider]  finasteride (PROSCAR) 5 MG tablet Take 5 mg by mouth daily.    [provider]  furosemide (LASIX) 20 MG tablet Take 1 tablet (20 mg total) by mouth daily. 11/29/17   Johnson, Clanford L, MD  labetalol (NORMODYNE) 100 MG tablet Take 0.5 tablets (50 mg total) by mouth 2 (two) times daily. Hold for SBP less than 120 or HR less than 60 11/29/17   Johnson, Eldridge Dace, MD  Misc. Devices (ADJUSTABLE ALUMINUM CANE) MISC Use the cane as needed for weight bearing 06/22/17   Triplett, Tammy, PA-C  montelukast (SINGULAIR) 10 MG tablet Take 10 mg by mouth at bedtime.    [provider]  pantoprazole (PROTONIX) 40 MG tablet Take 1 tablet (40 mg total) by mouth daily at 6 (six) AM. 11/28/17   Murlean Iba, MD    Family History History reviewed. No pertinent family history.  Social History Social History   Tobacco Use  . Smoking status: Former Smoker    Last attempt to quit: 11/06/1975    Years since quitting: 42.1  . Smokeless tobacco: Never Used  Substance Use Topics  . Alcohol use: No  . Drug use: No     Allergies   Penicillins   Review of Systems Review of Systems  Unable to perform ROS: Dementia     Physical Exam Updated Vital Signs BP (!) 144/63   Pulse 94   Temp 98.7 F (37.1 C) (Oral)   Resp (!) 21   Ht 5\' 5"  (1.651 m)   Wt 72.6 kg   SpO2 98%   BMI 26.63 kg/m   Physical Exam  Constitutional: He appears well-developed.  HENT:  Head: Atraumatic.  Eyes: Pupils are equal, round, and reactive to light. EOM are normal.  Neck: Neck supple.  Cardiovascular: Normal rate.  Pulmonary/Chest: Effort normal.  Neurological: He is alert. No sensory deficit.  Patient has 1 out of 5 left upper extremity strength and 4- out of 5 left lower extremity strength. Otherwise:  Sensory exam normal for bilateral upper and lower extremities - and patient is able to discriminate  between sharp and dull. CN 2-12 intact No slurred speech  Skin: Skin is warm.  Nursing note and vitals reviewed.    ED Treatments / Results  Labs (all labs ordered are listed, but only abnormal results are displayed) Labs Reviewed  CBC - Abnormal; Notable for the following components:      Result Value   RBC 3.09 (*)    Hemoglobin 8.7 (*)    HCT 27.4 (*)    RDW 16.6 (*)    All other components within normal limits  DIFFERENTIAL - Abnormal; Notable for the following components:   Monocytes Absolute 1.1 (*)    All other components within normal limits  CBG MONITORING, ED - Abnormal; Notable for the following components:   Glucose-Capillary 161 (*)    All other components within normal limits  I-STAT CHEM 8, ED - Abnormal; Notable for the following components:   Potassium 3.3 (*)    Chloride 94 (*)    Creatinine, Ser 2.80 (*)    Glucose, Bld 181 (*)    Calcium, Ion 1.00 (*)    TCO2 35 (*)    Hemoglobin 8.8 (*)    HCT 26.0 (*)    All other components within normal limits  COMPREHENSIVE METABOLIC PANEL  PROTIME-INR  APTT  I-STAT TROPONIN, ED    EKG EKG Interpretation  Date/Time:  Monday December 12 2017 17:32:29 EST Ventricular Rate:  89 PR Interval:    QRS Duration: 96 QT Interval:  405 QTC Calculation: 493 R Axis:   -59 Text Interpretation:  Sinus rhythm Left anterior fascicular block Abnormal R-wave progression, early transition Left ventricular hypertrophy Anterior Q waves, possibly due to LVH No significant change since last tracing Confirmed by Varney Biles [phone]) on 12/12/2017 7:20:42 PM   Radiology Ct Head Wo Contrast  Result Date: 12/12/2017 CLINICAL DATA:  Left arm weakness that started yesterday. History of dementia EXAM: CT HEAD WITHOUT CONTRAST TECHNIQUE: Contiguous axial images were obtained from the base of the skull through the vertex without intravenous contrast. COMPARISON:  09/06/2017 FINDINGS: Brain: No evidence of acute infarction,  hemorrhage, hydrocephalus, extra-axial collection or mass lesion/mass effect. Atrophy with frontal predilection. Chronic small vessel ischemia with confluent low-density in the cerebral white matter. Vascular: No hyperdense vessel or unexpected calcification. Skull: Normal. Negative for fracture or focal lesion. Sinuses/Orbits: No acute finding. IMPRESSION: 1. No acute finding. 2. Frontal predominant atrophy. Electronically Signed   By: Monte Fantasia [name]   On: 12/12/2017 18:48    Procedures Procedures (including critical care time)  Medications Ordered in ED Medications - No data to display   Initial Impression / Assessment and Plan / ED Course  I have reviewed the triage vital signs and the nursing notes.  Pertinent labs & imaging results that were available during my care of the patient were reviewed by me and considered in my medical decision making (see chart for details).     84 year old male with history of CKD, DM, hypertension, hyperlipidemia comes in with chief complaint of left-sided weakness.  Patient has dementia, therefore history is limited.  On exam patient is noted to be alert with significant weakness over the left upper extremity.  He is noted to have 4- out of 5 strength in the left lower extremity.  It appears that his ataxic gait is because of weakness in his left leg and not because of balance issues.  CT had ordered and is not showing any acute findings.  Labs are overall at baseline normal.  We will admit him to medicine service.  7:34 PM Hospitalist to admit the patient.  Dr. Merlene Laughter paged as per request of Dr. [name].  Final Clinical Impressions(s) / ED Diagnoses   Final diagnoses:  Ischemic stroke [facility])    ED Discharge Orders    None       Varney Biles, MD 12/12/17 1918    Varney Biles, MD 12/12/17 4825

## 2017-12-12 NOTE — ED Notes (Signed)
Hospital Provider at bedside 

## 2017-12-12 NOTE — ED Triage Notes (Signed)
Patient with some effort against gravity with the Left arm but falls quickly. Minimal grip. No deficit in legs. No facial droop. Speech is clear. Patient has history of dementia but is able to follow commands.

## 2017-12-12 NOTE — ED Notes (Signed)
Patient transported to CT 

## 2017-12-12 NOTE — ED Triage Notes (Signed)
Son reports patient's symptoms started today. LSW yesterday.

## 2017-12-13 ENCOUNTER — Observation Stay (HOSPITAL_COMMUNITY): Payer: Medicare HMO

## 2017-12-13 ENCOUNTER — Other Ambulatory Visit: Payer: Self-pay

## 2017-12-13 DIAGNOSIS — I10 Essential (primary) hypertension: Secondary | ICD-10-CM | POA: Diagnosis not present

## 2017-12-13 DIAGNOSIS — R4182 Altered mental status, unspecified: Secondary | ICD-10-CM | POA: Diagnosis not present

## 2017-12-13 DIAGNOSIS — I69952 Hemiplegia and hemiparesis following unspecified cerebrovascular disease affecting left dominant side: Secondary | ICD-10-CM | POA: Diagnosis not present

## 2017-12-13 DIAGNOSIS — R69 Illness, unspecified: Secondary | ICD-10-CM | POA: Diagnosis not present

## 2017-12-13 DIAGNOSIS — I639 Cerebral infarction, unspecified: Secondary | ICD-10-CM

## 2017-12-13 DIAGNOSIS — E876 Hypokalemia: Secondary | ICD-10-CM | POA: Diagnosis not present

## 2017-12-13 DIAGNOSIS — E782 Mixed hyperlipidemia: Secondary | ICD-10-CM | POA: Diagnosis not present

## 2017-12-13 DIAGNOSIS — N186 End stage renal disease: Secondary | ICD-10-CM | POA: Diagnosis not present

## 2017-12-13 DIAGNOSIS — J9811 Atelectasis: Secondary | ICD-10-CM | POA: Diagnosis not present

## 2017-12-13 DIAGNOSIS — F0390 Unspecified dementia without behavioral disturbance: Secondary | ICD-10-CM | POA: Diagnosis not present

## 2017-12-13 DIAGNOSIS — R29898 Other symptoms and signs involving the musculoskeletal system: Secondary | ICD-10-CM | POA: Diagnosis not present

## 2017-12-13 DIAGNOSIS — I6523 Occlusion and stenosis of bilateral carotid arteries: Secondary | ICD-10-CM | POA: Diagnosis not present

## 2017-12-13 LAB — LIPID PANEL
CHOL/HDL RATIO: 3 ratio
Cholesterol: 126 mg/dL (ref 0–200)
HDL: 42 mg/dL (ref >40)
LDL Cholesterol: 67 mg/dL (ref 0–99)
TRIGLYCERIDES: 86 mg/dL (ref <150)
VLDL: 17 mg/dL (ref 0–40)

## 2017-12-13 LAB — RESPIRATORY PANEL BY PCR
Adenovirus: NOT DETECTED
Bordetella pertussis: NOT DETECTED
CHLAMYDOPHILA PNEUMONIAE-RVPPCR: NOT DETECTED
Coronavirus 229E: NOT DETECTED
Coronavirus HKU1: NOT DETECTED
Coronavirus NL63: NOT DETECTED
Coronavirus OC43: NOT DETECTED
INFLUENZA B-RVPPCR: NOT DETECTED
Influenza A: NOT DETECTED
Metapneumovirus: NOT DETECTED
Mycoplasma pneumoniae: NOT DETECTED
Parainfluenza Virus 1: NOT DETECTED
Parainfluenza Virus 2: NOT DETECTED
Parainfluenza Virus 3: NOT DETECTED
Parainfluenza Virus 4: NOT DETECTED
Respiratory Syncytial Virus: NOT DETECTED
Rhinovirus / Enterovirus: NOT DETECTED

## 2017-12-13 LAB — GLUCOSE, CAPILLARY
Glucose-Capillary: 118 mg/dL — ABNORMAL HIGH (ref 70–99)
Glucose-Capillary: 119 mg/dL — ABNORMAL HIGH (ref 70–99)

## 2017-12-13 LAB — RENAL FUNCTION PANEL
Albumin: 2.8 g/dL — ABNORMAL LOW (ref 3.5–5.0)
Anion gap: 9 (ref 5–15)
BUN: 22 mg/dL (ref 8–23)
CHLORIDE: 101 mmol/L (ref 98–111)
CO2: 26 mmol/L (ref 22–32)
Calcium: 8.9 mg/dL (ref 8.9–10.3)
Creatinine, Ser: 3.76 mg/dL — ABNORMAL HIGH (ref 0.61–1.24)
GFR calc Af Amer: 16 mL/min — ABNORMAL LOW (ref >60)
GFR calc non Af Amer: 14 mL/min — ABNORMAL LOW (ref >60)
GLUCOSE: 124 mg/dL — AB (ref 70–99)
Phosphorus: 2.8 mg/dL (ref 2.5–4.6)
Potassium: 3.6 mmol/L (ref 3.5–5.1)
Sodium: 136 mmol/L (ref 135–145)

## 2017-12-13 LAB — HEMOGLOBIN A1C
Hgb A1c MFr Bld: 5.2 % (ref 4.8–5.6)
Mean Plasma Glucose: 102.54 mg/dL

## 2017-12-13 LAB — POTASSIUM: Potassium: 3 mmol/L — ABNORMAL LOW (ref 3.5–5.1)

## 2017-12-13 MED ORDER — HYDRALAZINE HCL 20 MG/ML IJ SOLN: 10.0000 mg | Freq: Four times a day (QID) | INTRAMUSCULAR | Status: DC | PRN

## 2017-12-13 MED ORDER — CHLORHEXIDINE GLUCONATE CLOTH 2 % EX PADS
6.0000 | MEDICATED_PAD | Freq: Every day | CUTANEOUS | Status: DC
  Administered 2017-12-13 – 2017-12-14 (×2): 6 via TOPICAL

## 2017-12-13 MED ORDER — CALCITRIOL 0.25 MCG PO CAPS
0.2500 ug | ORAL_CAPSULE | ORAL | Status: DC
  Administered 2017-12-14: 0.25 ug via ORAL
  Filled 2017-12-13: qty 1

## 2017-12-13 MED ORDER — EPOETIN ALFA NICU SYRINGE 2000 UNITS/ML: 400.0000 [IU]/kg | INTRAMUSCULAR | Status: DC

## 2017-12-13 MED ORDER — POTASSIUM CHLORIDE CRYS ER 20 MEQ PO TBCR
40.0000 meq | EXTENDED_RELEASE_TABLET | Freq: Once | ORAL | Status: AC
  Administered 2017-12-13: 40 meq via ORAL
  Filled 2017-12-13: qty 2

## 2017-12-13 NOTE — Progress Notes (Signed)
Pt ambulated to bathroom well. Lt hand grip some what firmer although still not able to hold arm up.

## 2017-12-13 NOTE — Progress Notes (Signed)
Pt not able to move left arm or able to squeeze hand. Other extremities have positive motor movements.

## 2017-12-13 NOTE — Progress Notes (Signed)
SLP Cancellation Note  Patient Details Name: JOSTEN WARMUTH MRN: [id number] DOB: April 16, 1930   Cancelled treatment:       Reason Eval/Treat Not Completed: SLP screened, no needs identified, will sign off; SLP screened Pt in room. Pt denies any changes in swallowing, speech, language, or cognition. MRI negative for acute changes. SLE will be deferred at this time. Reconsult if indicated. SLP will sign off.  Thank you,  Genene Churn, [place]   [place] 12/13/2017, 2:46 PM

## 2017-12-13 NOTE — Progress Notes (Signed)
Pt has some control over lt arm. Is able to move it and squeeze hand although lightly. Is able to raise it up some but when raised for him falls to gravity.

## 2017-12-13 NOTE — Evaluation (Signed)
Physical Therapy Evaluation Patient Details Name: Miguel Tucker MRN: [id number] DOB: 06-24-1930 Today's Date: 12/13/2017   History of Present Illness  Miguel Tucker is a 84 y.o. male with medical history significant of asthma, ESRD on HD, dementia, type 2 diabetes, hypertension, hyperlipidemia presenting to the hospital for evaluation of left arm weakness.  Patient reports having weakness in his left upper extremity.  Denies having any loss of sensation or tingling.  Per son at bedside, patient was last seen in his usual state of health yesterday evening at around 7 PM.  This morning when patient's son went to his house to take him for dialysis, he noted that the patient could not lift his left arm.  Patient did go for his full session of dialysis.  Son also noticed that patient had difficulty using his walker to ambulate as he could not use his left upper extremity to grasp the bar.  He did not notice any leg weakness or dragging of leg.  Patient denies any prior history of stroke.    Clinical Impression  Patient functioning near baseline for functional mobility and gait, other than LUE weakness which limits bed mobility and sitting up at bedside.  Patient demonstrates good return for completing sit to stands, bed<>chair transfers without use of AD and able to ambulate in hallways without loss of balance while maintaining left hand grip on RW.  Patient tolerated sitting up in chair after therapy - RN notified.  Patient will benefit from continued physical therapy in hospital and recommended venue below to increase strength, balance, endurance for safe ADLs and gait.    Follow Up Recommendations Home health PT;Supervision/Assistance - 24 hour    Equipment Recommendations  None recommended by PT    Recommendations for Other Services       Precautions / Restrictions Precautions Precautions: Fall Restrictions Weight Bearing Restrictions: No      Mobility  Bed Mobility Overal  bed mobility: Needs Assistance Bed Mobility: Supine to Sit     Supine to sit: Min assist     General bed mobility comments: had to use RUE to pull self up with therapist assist, unable to use LUE for scooting/pushing  Transfers Overall transfer level: Needs assistance Equipment used: Rolling walker (2 wheeled) Transfers: Sit to/from [facility] Sit to Stand: Supervision Stand pivot transfers: Supervision      Lateral/Scoot Transfers: Min assist(from bed to MRI transport gurney) General transfer comment: able to maintain grip on RW, can transfer and walk short distances without use of AD  Ambulation/Gait Ambulation/Gait assistance: Supervision Gait Distance (Feet): 150 Feet Assistive device: Rolling walker (2 wheeled) Gait Pattern/deviations: Decreased step length - right;Decreased step length - left;Decreased stride length Gait velocity: decreased   General Gait Details: slightly labored cadence without loss of balance and able to maintain left hand grip on RW  Stairs            Wheelchair Mobility    Modified Rankin (Stroke Patients Only)       Balance Overall balance assessment: Needs assistance Sitting-balance support: Feet supported;No upper extremity supported Sitting balance-Leahy Scale: Good     Standing balance support: During functional activity;No upper extremity supported Standing balance-Leahy Scale: Fair Standing balance comment: fair/good using RW                             Pertinent Vitals/Pain Pain Assessment: No/denies pain    Home Living Family/patient expects to be  discharged to:: Private residence Living Arrangements: Spouse/significant other;Children Available Help at Discharge: Family;Available 24 hours/day Type of Home: House Home Access: Stairs to enter Entrance Stairs-Rails: None Entrance Stairs-Number of Steps: 3 Home Layout: One level Home Equipment: Walker - 2 wheels;Bedside commode Additional  Comments: all information, "per patient - poor historian"    Prior Function Level of Independence: Needs assistance   Gait / Transfers Assistance Needed: household and short distanced community ambulator with RW  ADL's / Homemaking Assistance Needed: assisted by family        Hand Dominance   Dominant Hand: Right    Extremity/Trunk Assessment   Upper Extremity Assessment Upper Extremity Assessment: Defer to OT evaluation LUE Deficits / Details: LUE strength: shoulder 1/5, elbow 3-/5, wrist 2/5, unable to form full fist due to finger stiffness, good trapezius activation LUE Sensation: WNL LUE Coordination: decreased fine motor    Lower Extremity Assessment Lower Extremity Assessment: Overall WFL for tasks assessed    Cervical / Trunk Assessment Cervical / Trunk Assessment: Normal  Communication   Communication: No difficulties  Cognition Arousal/Alertness: Awake/alert Behavior During Therapy: WFL for tasks assessed/performed Overall Cognitive Status: Within Functional Limits for tasks assessed                                        General Comments      Exercises     Assessment/Plan    PT Assessment Patient needs continued PT services  PT Problem List Decreased strength;Decreased activity tolerance;Decreased balance;Decreased mobility       PT Treatment Interventions Gait training;Stair training;Patient/family education;Functional mobility training;Therapeutic activities;Therapeutic exercise    PT Goals (Current goals can be found in the Care Plan section)  Acute Rehab PT Goals Patient Stated Goal: return home with family to assist PT Goal Formulation: With patient Time For Goal Achievement: 12/17/17 Potential to Achieve Goals: Good    Frequency 7X/week   Barriers to discharge        Co-evaluation               AM-PAC PT "6 Clicks" Mobility  Outcome Measure Help needed turning from your back to your side while in a flat bed  without using bedrails?: None Help needed moving from lying on your back to sitting on the side of a flat bed without using bedrails?: Total Help needed moving to and from a bed to a chair (including a wheelchair)?: A Little Help needed standing up from a chair using your arms (e.g., wheelchair or bedside chair)?: A Little Help needed to walk in hospital room?: A Little Help needed climbing 3-5 steps with a railing? : A Little 6 Click Score: 17    End of Session Equipment Utilized During Treatment: Gait belt Activity Tolerance: Patient tolerated treatment well;Patient limited by fatigue Patient left: in chair;with call bell/phone within reach Nurse Communication: Mobility status PT Visit Diagnosis: Unsteadiness on feet (R26.81);Other abnormalities of gait and mobility (R26.89);Muscle weakness (generalized) (M62.81)    Time: 3825-0539 PT Time Calculation (min) (ACUTE ONLY): 26 min   Charges:   PT Evaluation $PT Eval Moderate Complexity: 1 Mod PT Treatments $Therapeutic Activity: 23-37 mins        11:53 AM, 12/13/17 Lonell Grandchild, MPT Physical Therapist with [facility] [phone] office [phone] mobile phone

## 2017-12-13 NOTE — Consult Note (Signed)
Reason for Consult: To manage dialysis and dialysis related needs Referring Physician: Tat  Miguel Tucker is an 84 y.o. male with past medical history significant for hypertension, type 2 diabetes mellitus, hyperlipidemia, dementia, ESRD-receiving dialysis at the [facility] unit on Monday Wednesday Friday.  He has status post a hospitalization in November for GI bleed.  Patient was brought to the emergency department because yesterday when son saw the patient prior to his dialysis he was complaining of left extremity weakness and could not lift his left arm.  However, he was taken to the kidney center, ran his full treatment without incident, left at his dry weight but then presented to the hospital.  He is being admitted for a rule out stroke protocol.  We are being asked to provide his routine hemodialysis while hospitalized   Dialyzes at [place] 70.  MWF-4 hours HD Bath 2/2, Dialyzer 180, Heparin yes, 5200 per treatment. Access left AV graft-awaiting TDC removal.  Also gets calcitriol 0.25 q. treatment, Mircera 75 was given on 11/26 last hemoglobin 8.0.  Also has recently received iron repletion.  Last calcium 9.3, phosphorus 3.8 and PTH 594  Past Medical History:  Diagnosis Date  . Asthma   . Chronic kidney disease    on dialysis  . Chronic knee pain   . Dementia ([place])   . Dementia ([place])   . Diabetes mellitus    type 2  . Frequent urination at night   . Gout   . Hard of hearing   . High cholesterol   . Hypertension   . Hypokalemia   . Radicular pain of left lower extremity     Past Surgical History:  Procedure Laterality Date  . APPENDECTOMY    . AV FISTULA PLACEMENT Left 10/18/2017   Procedure: INSERTION OF ARTERIOVENOUS (AV) GORE-TEX GRAFT LEFT UPPER  ARM;  Surgeon: Waynetta Sandy, MD;  Location: [facility];  Service: Vascular;  Laterality: Left;  . BASCILIC VEIN TRANSPOSITION Left 09/09/2015   Procedure: FIRST STAGE BRACHIAL VEIN TRANSPOSITION LEFT  ARM;  Surgeon: Elam Dutch, MD;  Location: [facility];  Service: Vascular;  Laterality: Left;  . BASCILIC VEIN TRANSPOSITION Left 02/20/2016   Procedure: LEFT ARM SECOND STAGE BASILIC VEIN TRANSPOSITION;  Surgeon: Rosetta Posner, MD;  Location: [facility];  Service: Vascular;  Laterality: Left;  . ESOPHAGOGASTRODUODENOSCOPY N/A 11/27/2017   Procedure: ESOPHAGOGASTRODUODENOSCOPY (EGD);  Surgeon: Rogene Houston, MD;  [facility];  Service: Endoscopy;  Laterality: N/A;  . INSERTION OF DIALYSIS CATHETER Right 10/18/2017   Procedure: INSERTION OF TUNNELED  DIALYSIS CATHETER RIGHT INTERNAL JUGULAR;  Surgeon: Waynetta Sandy, MD;  Location: [facility];  Service: Vascular;  Laterality: Right;  . OTHER SURGICAL HISTORY     testicular tumor- benign   . ROTATOR CUFF REPAIR Right   . WOUND DEBRIDEMENT  06/01/2011   Procedure: DEBRIDEMENT ABDOMINAL WOUND;  Surgeon: [name], MD;  Location: [facility];  Service: General;  Laterality: N/A;  Remove Sutures     History reviewed. No pertinent family history.  Social History:  reports that he quit smoking about 42 years ago. He has never used smokeless tobacco. He reports that he does not drink alcohol or use drugs.  Allergies:  Allergies  Allergen Reactions  . Penicillins Itching and Other (See Comments)    On arms only. Has patient had a PCN reaction causing immediate rash, facial/tongue/throat swelling, SOB or lightheadedness with hypotension: No Has patient had a PCN reaction causing  severe rash involving mucus membranes or skin necrosis: No Has patient had a PCN reaction that required hospitalization No Has patient had a PCN reaction occurring within the last 10 years: No If all of the above answers are "NO", then may proceed with Cephalosporin use.     Medications: I have reviewed the patient's current medications.   Results for orders placed or performed during the hospital encounter of 12/12/17 (from the past 48 hour(s))  CBG  monitoring, ED     Status: Abnormal   Collection Time: 12/12/17  5:30 PM  Result Value Ref Range   Glucose-Capillary 161 (H) 70 - 99 mg/dL  CBC     Status: Abnormal   Collection Time: 12/12/17  6:00 PM  Result Value Ref Range   WBC 9.7 4.0 - 10.5 K/uL   RBC 3.09 (L) 4.22 - 5.81 MIL/uL   Hemoglobin 8.7 (L) 13.0 - 17.0 g/dL   HCT 27.4 (L) 39.0 - 52.0 %   MCV 88.7 80.0 - 100.0 fL   MCH 28.2 26.0 - 34.0 pg   MCHC 31.8 30.0 - 36.0 g/dL   RDW 16.6 (H) 11.5 - 15.5 %   Platelets 258 150 - 400 K/uL   nRBC 0.0 0.0 - 0.2 %    Comment: Performed at [facility], [address] [place], [place] [address]  Differential     Status: Abnormal   Collection Time: 12/12/17  6:00 PM  Result Value Ref Range   Neutrophils Relative % 77 %   Neutro Abs 7.5 1.7 - 7.7 K/uL   Lymphocytes Relative 11 %   Lymphs Abs 1.0 0.7 - 4.0 K/uL   Monocytes Relative 11 %   Monocytes Absolute 1.1 (H) 0.1 - 1.0 K/uL   Eosinophils Relative 1 %   Eosinophils Absolute 0.1 0.0 - 0.5 K/uL   Basophils Relative 0 %   Basophils Absolute 0.0 0.0 - 0.1 K/uL   Immature Granulocytes 0 %   Abs Immature Granulocytes 0.04 0.00 - 0.07 K/uL    Comment: Performed at [facility], [address] [place], [place] [address]  I-stat troponin, ED     Status: None   Collection Time: 12/12/17  6:02 PM  Result Value Ref Range   Troponin i, poc 0.01 0.00 - 0.08 ng/mL   Comment 3            Comment: Due to the release kinetics of cTnI, a negative result within the first hours of the onset of symptoms does not rule out myocardial infarction with certainty. If myocardial infarction is still suspected, repeat the test at appropriate intervals.   I-Stat Chem 8, ED     Status: Abnormal   Collection Time: 12/12/17  6:05 PM  Result Value Ref Range   Sodium 135 135 - 145 mmol/L   Potassium 3.3 (L) 3.5 - 5.1 mmol/L   Chloride 94 (L) 98 - 111 mmol/L   BUN 14 8 - 23 mg/dL   Creatinine, Ser 2.80 (H) 0.61 - 1.24 mg/dL   Glucose, Bld 181 (H) 70 -  99 mg/dL   Calcium, Ion 1.00 (L) 1.15 - 1.40 mmol/L   TCO2 35 (H) 22 - 32 mmol/L   Hemoglobin 8.8 (L) 13.0 - 17.0 g/dL   HCT 26.0 (L) 39.0 - 52.0 %  Comprehensive metabolic panel     Status: Abnormal   Collection Time: 12/12/17  7:25 PM  Result Value Ref Range   Sodium 137 135 - 145 mmol/L   Potassium 2.8 (L)  3.5 - 5.1 mmol/L   Chloride 97 (L) 98 - 111 mmol/L   CO2 28 22 - 32 mmol/L   Glucose, Bld 183 (H) 70 - 99 mg/dL   BUN 15 8 - 23 mg/dL   Creatinine, Ser 2.86 (H) 0.61 - 1.24 mg/dL   Calcium 8.5 (L) 8.9 - 10.3 mg/dL   Total Protein 6.6 6.5 - 8.1 g/dL   Albumin 3.1 (L) 3.5 - 5.0 g/dL   AST 20 15 - 41 U/L   ALT 19 0 - 44 U/L   Alkaline Phosphatase 99 38 - 126 U/L   Total Bilirubin 0.6 0.3 - 1.2 mg/dL   GFR calc non Af Amer 19 (L) >60 mL/min   GFR calc Af Amer 22 (L) >60 mL/min   Anion gap 12 5 - 15    Comment: Performed at [facility], [address] [place], [place] [address]  Protime-INR     Status: None   Collection Time: 12/12/17  7:25 PM  Result Value Ref Range   Prothrombin Time 13.4 11.4 - 15.2 seconds   INR 1.03     Comment: Performed at [facility], [address], [place], [place] [address]  APTT     Status: Abnormal   Collection Time: 12/12/17  7:25 PM  Result Value Ref Range   aPTT 37 (H) 24 - 36 seconds    Comment:        IF BASELINE aPTT IS ELEVATED, SUGGEST PATIENT RISK ASSESSMENT BE USED TO DETERMINE APPROPRIATE ANTICOAGULANT THERAPY. Performed at [facility], [address], [place], [place] [address]   Magnesium     Status: None   Collection Time: 12/12/17  7:25 PM  Result Value Ref Range   Magnesium 1.9 1.7 - 2.4 mg/dL    Comment: Performed at [facility], [address], [place], [place] [address]  Potassium     Status: Abnormal   Collection Time: 12/12/17 10:26 PM  Result Value Ref Range   Potassium 2.8 (L) 3.5 - 5.1 mmol/L    Comment: Performed at [facility], [address], [place], [place] [address]  Potassium     Status: Abnormal    Collection Time: 12/13/17 12:21 AM  Result Value Ref Range   Potassium 3.0 (L) 3.5 - 5.1 mmol/L    Comment: Performed at [facility], [address], [place], [place] [address]  Lipid panel     Status: None   Collection Time: 12/13/17  5:30 AM  Result Value Ref Range   Cholesterol 126 0 - 200 mg/dL   Triglycerides 86 <150 mg/dL   HDL 42 >40 mg/dL   Total CHOL/HDL Ratio 3.0 RATIO   VLDL 17 0 - 40 mg/dL   LDL Cholesterol 67 0 - 99 mg/dL    Comment:        Total Cholesterol/HDL:CHD Risk Coronary Heart Disease Risk Table                     Men   Women  1/2 Average Risk   3.4   3.3  Average Risk       5.0   4.4  2 X Average Risk   9.6   7.1  3 X Average Risk  23.4   11.0        Use the calculated Patient Ratio above and the CHD Risk Table to determine the patient's CHD Risk.        ATP III CLASSIFICATION (LDL):  <100  mg/dL   Optimal  100-129  mg/dL   Near or Above                    Optimal  130-159  mg/dL   Borderline  160-189  mg/dL   High  >190     mg/dL   Very High Performed at [facility] [place], [place] [address]   Renal function panel     Status: Abnormal   Collection Time: 12/13/17  5:30 AM  Result Value Ref Range   Sodium 136 135 - 145 mmol/L   Potassium 3.6 3.5 - 5.1 mmol/L   Chloride 101 98 - 111 mmol/L   CO2 26 22 - 32 mmol/L   Glucose, Bld 124 (H) 70 - 99 mg/dL   BUN 22 8 - 23 mg/dL   Creatinine, Ser 3.76 (H) 0.61 - 1.24 mg/dL   Calcium 8.9 8.9 - 10.3 mg/dL   Phosphorus 2.8 2.5 - 4.6 mg/dL   Albumin 2.8 (L) 3.5 - 5.0 g/dL   GFR calc non Af Amer 14 (L) >60 mL/min   GFR calc Af Amer 16 (L) >60 mL/min   Anion gap 9 5 - 15    Comment: Performed at [facility], [address], [place], [place] [address]    Dg Chest 1 View  Result Date: 12/13/2017 CLINICAL DATA:  Fever EXAM: CHEST  1 VIEW COMPARISON:  11/25/2017 FINDINGS: Right dialysis catheter in place with the tip at the cavoatrial junction, stable. Cardiomegaly with vascular  congestion. Low lung volumes with bibasilar atelectasis. No visible significant effusions or acute bony abnormality. IMPRESSION: Low lung volumes. Mild cardiomegaly, vascular congestion and bibasilar atelectasis. Electronically Signed   By: Rolm Baptise [name]   On: 12/13/2017 09:09   Ct Head Wo Contrast  Result Date: 12/12/2017 CLINICAL DATA:  Left arm weakness that started yesterday. History of dementia EXAM: CT HEAD WITHOUT CONTRAST TECHNIQUE: Contiguous axial images were obtained from the base of the skull through the vertex without intravenous contrast. COMPARISON:  09/06/2017 FINDINGS: Brain: No evidence of acute infarction, hemorrhage, hydrocephalus, extra-axial collection or mass lesion/mass effect. Atrophy with frontal predilection. Chronic small vessel ischemia with confluent low-density in the cerebral white matter. Vascular: No hyperdense vessel or unexpected calcification. Skull: Normal. Negative for fracture or focal lesion. Sinuses/Orbits: No acute finding. IMPRESSION: 1. No acute finding. 2. Frontal predominant atrophy. Electronically Signed   By: Monte Fantasia [name]   On: 12/12/2017 18:48   Mr Brain Wo Contrast  Result Date: 12/13/2017 CLINICAL DATA:  Altered mental status for 2 days.  No known injury EXAM: MRI HEAD WITHOUT CONTRAST TECHNIQUE: Multiplanar, multiecho pulse sequences of the brain and surrounding structures were obtained without intravenous contrast. COMPARISON:  Head CT from yesterday FINDINGS: Brain: No acute infarction, hemorrhage, hydrocephalus, or mass lesion. Cerebral volume loss and moderate chronic small vessel ischemia in the cerebral white matter. Prominent bifrontal extra-axial CSF suspicious for hygromas, although cortical vessel displacement is not well demonstrated. This appearance is non progressed since at least April 2019. Vascular: Major flow voids are preserved Skull and upper cervical spine: Diffuse hypointense marrow space most likely from patient's  end-stage renal disease. Sinuses/Orbits: Negative IMPRESSION: 1. No acute finding. 2. Atrophy and chronic small vessel ischemia. Electronically Signed   By: Monte Fantasia [name]   On: 12/13/2017 08:56    ROS: says arm is better but still weak -otherwise negative Blood pressure 129/73, pulse 82, temperature 98.8 F (37.1 C), temperature source Oral,  resp. rate (!) 21, height 5\' 5"  (1.651 m), weight 72.6 kg, SpO2 100 %. General appearance: alert and no distress Resp: clear to auscultation bilaterally Cardio: regular rate and rhythm, S1, S2 normal, no murmur, click, rub or gallop GI: soft, non-tender; bowel sounds normal; no masses,  no organomegaly Extremities: extremities normal, atraumatic, no cyanosis or edema Left upper arm AV graft with good thrill and bruit, also right sided TDC in place-cannot lift arm on own.  Has a weak handgrip  Assessment/Plan: 84 year old black male with diabetes, hypertension and ESRD.  Now presenting with acute left arm weakness suspicious for CVA 1 new onset acute left arm weakness-suspicious for CVA.  Work-up in progress-has had some functional improvement.  Is on full dose aspirin 2 ESRD: Normally MWF-[place]-we will continue schedule and plan for routine hemodialysis tomorrow via left upper arm graft.  TDC is ready for removal but cannot get it done as inpatient unfortunately-will need to be arranged as outpatient 3 Hypertension: Blood pressure well controlled.  Volume status seems good.  At the outpatient center there are 3 antihypertensives listed, however not on any here with a good blood pressure-continue no meds 4. Anemia of ESRD: Was noted to have recent GI bleed.  Even though hemoglobin is low it is better than what it was as an outpatient.  Due for ESA, will dose tomorrow.  Status post iron repletion as outpatient 5. Metabolic Bone Disease: Had Renvela on med list as outpatient but phosphorus also very good-we will hold for now 6.  Hypokalemia-were  likely seeing post dialysis potassium.  Thankfully repletion did not give hyperkalemia.  2K bath tomorrow  Louis Meckel 12/13/2017, 9:13 AM

## 2017-12-13 NOTE — Care Management Note (Signed)
Case Management Note  Patient Details  Name: Miguel Tucker MRN: [id number] Date of Birth: 1930/12/29  Subjective/Objective:                 Admitted with weakness. Pt from home with family. Pt's son and POA at bedside. Pt has been to SNF in the past but son moved from [place] and is staying with parents to provide supervision. Pt receives HD MWF. Was able to ambulate 156ft with supervision today. Has had HH in the past but son unsure of [facility] agency. Family does want HH at DC.   Action/Plan: CM provided list of [place] provider. Son will review and respond with choice prior to DC.   Expected Discharge Date:  12/13/17               Expected Discharge Plan:  [place]  In-House Referral:  NA  Discharge planning Services  CM Consult  Post Acute Care Choice:  Home Health Choice offered to:  [facility] POA / Guardian  HH Arranged:  PT, OT HH Agency:     Status of Service:  In process, will continue to follow  If discussed at Long Length of Stay Meetings, dates discussed:    Additional Comments:  Sherald Barge, RN 12/13/2017, 3:13 PM

## 2017-12-13 NOTE — Evaluation (Signed)
Occupational Therapy Evaluation Patient Details Name: Miguel Tucker MRN: [id number] DOB: 29-Jan-1930 Today's Date: 12/13/2017    History of Present Illness Miguel Tucker is a 84 y.o. male with medical history significant of asthma, ESRD on HD, dementia, type 2 diabetes, hypertension, hyperlipidemia presenting to the hospital for evaluation of left arm weakness.  Patient reports having weakness in his left upper extremity.  Denies having any loss of sensation or tingling.  Per son at bedside, patient was last seen in his usual state of health yesterday evening at around 7 PM.  This morning when patient's son went to his house to take him for dialysis, he noted that the patient could not lift his left arm.  Patient did go for his full session of dialysis.  Son also noticed that patient had difficulty using his walker to ambulate as he could not use his left upper extremity to grasp the bar.  He did not notice any leg weakness or dragging of leg.  Patient denies any prior history of stroke.   Clinical Impression   Pt received lying supine in bed, agreeable to OT evaluation. Pt demonstrating LUE weakness and coordination deficits limiting ability to complete ADLs due to decreased LUE functional use. Pt able to use LUE for some low level ADLs such as doffing socks, however unable to actively flex shoulder to perform ADLs above waist level. Pt performing functional mobility tasks at baseline, is unable to use RW as he usually would due to decreased ability to grip walker with left hand. Recommend outpatient OT services to improve strength and functional use of LUE if pt's family is able to provide transportation. Will continue to follow while in acute care.     Follow Up Recommendations  Outpatient OT;Supervision/Assistance - 24 hour    Equipment Recommendations  Tub/shower seat       Precautions / Restrictions Precautions Precautions: Fall Restrictions Weight Bearing Restrictions: No       Mobility Bed Mobility Overal bed mobility: Needs Assistance Bed Mobility: Supine to Sit     Supine to sit: Min assist     General bed mobility comments: Pt able to lean over onto left arm to pull covers/gown out from under him with minimal difficulty.   Transfers Overall transfer level: Needs assistance Equipment used: None Transfers: Sit to/from [facility];Lateral/Scoot Transfers Sit to Stand: Supervision Stand pivot transfers: Supervision      Lateral/Scoot Transfers: Min assist(from bed to MRI transport gurney)          ADL either performed or assessed with clinical judgement   ADL Overall ADL's : Needs assistance/impaired                     Lower Body Dressing: Moderate assistance;Sitting/lateral leans Lower Body Dressing Details (indicate cue type and reason): pt able to doff socks using left hand, unable to donn therefore OT assisting. Pt reports wife usually assists with dressing               General ADL Comments: Pt requiring increased assistance due to limited ability to use LUE to assist with ADLs     Vision Baseline Vision/History: Wears glasses Wears Glasses: At all times Patient Visual Report: No change from baseline Vision Assessment?: No apparent visual deficits            Pertinent Vitals/Pain Pain Assessment: No/denies pain     Hand Dominance Right   Extremity/Trunk Assessment Upper Extremity Assessment Upper Extremity Assessment: LUE deficits/detail  LUE Deficits / Details: LUE strength: shoulder 1/5, elbow 3-/5, wrist 2/5, unable to form full fist due to finger stiffness, good trapezius activation LUE Sensation: WNL LUE Coordination: decreased fine motor   Lower Extremity Assessment Lower Extremity Assessment: Defer to PT evaluation   Cervical / Trunk Assessment Cervical / Trunk Assessment: Normal   Communication Communication Communication: No difficulties   Cognition Arousal/Alertness:  Awake/alert Behavior During Therapy: WFL for tasks assessed/performed Overall Cognitive Status: Within Functional Limits for tasks assessed                                                Home Living Family/patient expects to be discharged to:: Private residence Living Arrangements: Spouse/significant other;Children Available Help at Discharge: Family;Available 24 hours/day Type of Home: House       Home Layout: One level     Bathroom Shower/Tub: Teacher, early years/pre: Standard     Home Equipment: Environmental consultant - 2 wheels          Prior Functioning/Environment Level of Independence: Needs assistance  Gait / Transfers Assistance Needed: was using RW inconsistently  ADL's / Homemaking Assistance Needed: Wife assists with ADLs including dressing, bathing, meal preparation            OT Problem List: Decreased strength;Decreased activity tolerance;Decreased coordination;Impaired UE functional use      OT Treatment/Interventions: Self-care/ADL training;Therapeutic exercise;Neuromuscular education;Manual therapy;DME and/or AE instruction;Therapeutic activities;Patient/family education    OT Goals(Current goals can be found in the care plan section) Acute Rehab OT Goals Patient Stated Goal: to get my arm working better OT Goal Formulation: With patient Time For Goal Achievement: 12/27/17 Potential to Achieve Goals: Good  OT Frequency: Min 2X/week    AM-PAC OT "6 Clicks" Daily Activity     Outcome Measure Help from another person eating meals?: A Little Help from another person taking care of personal grooming?: A Little Help from another person toileting, which includes using toliet, bedpan, or urinal?: A Little Help from another person bathing (including washing, rinsing, drying)?: A Little Help from another person to put on and taking off regular upper body clothing?: A Little Help from another person to put on and taking off regular lower  body clothing?: A Little 6 Click Score: 18   End of Session Nurse Communication: Mobility status  Activity Tolerance: Patient tolerated treatment well Patient left: Other (comment)(with MRI transport)  OT Visit Diagnosis: Muscle weakness (generalized) (M62.81);Hemiplegia and hemiparesis Hemiplegia - Right/Left: Left Hemiplegia - dominant/non-dominant: Non-Dominant Hemiplegia - caused by: Unspecified                Time: 2956-2130 OT Time Calculation (min): 29 min Charges:  OT General Charges $OT Visit: 1 Visit OT Evaluation $OT Eval Low Complexity: [place], OTR/L  [phone] 12/13/2017, 8:23 AM

## 2017-12-13 NOTE — Plan of Care (Signed)
  Problem: Acute Rehab PT Goals(only PT should resolve) Goal: Pt Will Go Supine/Side To Sit Outcome: Progressing Flowsheets (Taken 12/13/2017 1154) Pt will go Supine/Side to Sit: with min guard assist Goal: Patient Will Transfer Sit To/From Stand Outcome: Progressing Flowsheets (Taken 12/13/2017 1154) Patient will transfer sit to/from stand: with modified independence Goal: Pt Will Transfer Bed To Chair/Chair To Bed Outcome: Progressing Flowsheets (Taken 12/13/2017 1154) Pt will Transfer Bed to Chair/Chair to Bed: with modified independence Goal: Pt Will Ambulate Outcome: Progressing Flowsheets (Taken 12/13/2017 1154) Pt will Ambulate: with modified independence; > 125 feet; with rolling walker; with cane Note:  Use cane if safe   11:55 AM, 12/13/17 Lonell Grandchild, MPT Physical Therapist with [facility] 336 [phone] office [phone] mobile phone

## 2017-12-13 NOTE — Plan of Care (Signed)
  Problem: Acute Rehab OT Goals (only OT should resolve) Goal: Pt. Will Perform Eating Flowsheets (Taken 12/13/2017 0826) Pt Will Perform Eating: with modified independence; sitting Goal: Pt. Will Perform Grooming Flowsheets (Taken 12/13/2017 0826) Pt Will Perform Grooming: with set-up; sitting Note:  Using LUE as assist Goal: Pt. Will Transfer To Toilet Flowsheets (Taken 12/13/2017 [phone]) Pt Will Transfer to Toilet: with modified independence; ambulating; regular height toilet Goal: Pt. Will Perform Toileting-Clothing Manipulation Flowsheets (Taken 12/13/2017 0826) Pt Will Perform Toileting - Clothing Manipulation and hygiene: with modified independence; sitting/lateral leans; sit to/from stand Goal: Pt/Caregiver Will Perform Home Exercise Program Flowsheets (Taken 12/13/2017 [phone]) Pt/caregiver will Perform Home Exercise Program: Increased strength; Left upper extremity; Independently; With written HEP provided

## 2017-12-13 NOTE — Consult Note (Signed)
[place] A. Miguel Laughter, MD     [email or web address]          Miguel Tucker is an 84 y.o. male.   ASSESSMENT/PLAN: 1.  Subacute left upper extremity monoparesis: The clinical picture seems consistent with rheumatism/orthopedic problems.  I suspect that the patient may have significant issues with gouty arthritis.  Imaging and physical examination does not support a primary neurological problem.  2.  Marked volume loss on MRI likely due to history of dementia.    Patient is a 84 year old white male who presents with severe pain and weakness of the left upper extremity patient does not report any symptoms involving the left leg.  He tells me these symptoms have been present for weeks.  He does not report dysarthria, dysphasia or symptoms on the right side.  He denies any neck pain.  There are no reports of chest pain, shortness of breath or significant gait problem.  There is cognitive impairment at baseline from a apparent history of dementia.  The patient tells me he typically gets his hemodialysis using his left upper extremity. ROS otherwise unrevealing.  GENERAL: This very pleasant male who is in no acute distress.  HEENT: Neck is supple no trauma appreciated.  ABDOMEN: soft  EXTREMITIES: There is significant swelling of the joints involving the left upper extremity especially the elbow wrist and hand.  This is associated with reduced range of motion.  There is some reduced range of motion of the left shoulder.  There is also significant arthritic changes of the joints of the left upper extremity.  The right side is without evidence of edema although there is chronic arthritic changes.  BACK: Normal  SKIN: Normal by inspection.    MENTAL STATUS: Alert and oriented. Speech, language and cognition are generally unremarkable. He is lucid and coherent. Judgment and insight fair.   CRANIAL NERVES: Pupils are equal, round and reactive to light and accomodation;  extra ocular movements are full, there is no significant nystagmus; visual fields are full; upper and lower facial muscles are normal in strength and symmetric, there is no flattening of the nasolabial folds; tongue is midline; uvula is midline; shoulder elevation is normal.  MOTOR: Left deltoid is 3/5.  Triceps 5/5 and bicep 4+.  Handgrip is 4.  There is mild atrophy of the FDI bilaterally.  This is associated with mild appropriate weakness.  The right upper extremity shows normal tone, bulk and strength.  The next lower extremities also shows normal tone, bulk and strength.  COORDINATION: Left finger to nose is normal, right finger to nose is normal, No rest tremor; no intention tremor; no postural tremor; no bradykinesia.  REFLEXES: Deep tendon reflexes are symmetrical and normal.    SENSATION: Normal to light touch, temperature, and pain.     Blood pressure (!) 98/51, pulse 77, temperature 98.1 F (36.7 C), temperature source Oral, resp. rate 18, height 5\' 5"  (1.651 m), weight 72.6 kg, SpO2 99 %.  Past Medical History:  Diagnosis Date  . Asthma   . Chronic kidney disease    on dialysis  . Chronic knee pain   . Dementia ([place])   . Dementia ([place])   . Diabetes mellitus    type 2  . Frequent urination at night   . Gout   . Hard of hearing   . High cholesterol   . Hypertension   . Hypokalemia   . Radicular pain of left lower extremity     Past Surgical History:  Procedure Laterality Date  . APPENDECTOMY    . AV FISTULA PLACEMENT Left 10/18/2017   Procedure: INSERTION OF ARTERIOVENOUS (AV) GORE-TEX GRAFT LEFT UPPER  ARM;  Surgeon: Waynetta Sandy, MD;  Location: [facility];  Service: Vascular;  Laterality: Left;  . BASCILIC VEIN TRANSPOSITION Left 09/09/2015   Procedure: FIRST STAGE BRACHIAL VEIN TRANSPOSITION LEFT ARM;  Surgeon: Elam Dutch, MD;  Location: [place] [facility];  Service: Vascular;  Laterality: Left;  . BASCILIC VEIN TRANSPOSITION Left 02/20/2016   Procedure: LEFT ARM  SECOND STAGE BASILIC VEIN TRANSPOSITION;  Surgeon: Rosetta Posner, MD;  Location: [place];  Service: Vascular;  Laterality: Left;  . ESOPHAGOGASTRODUODENOSCOPY N/A 11/27/2017   Procedure: ESOPHAGOGASTRODUODENOSCOPY (EGD);  Surgeon: Rogene Houston, MD;  [facility];  Service: Endoscopy;  Laterality: N/A;  . INSERTION OF DIALYSIS CATHETER Right 10/18/2017   Procedure: INSERTION OF TUNNELED  DIALYSIS CATHETER RIGHT INTERNAL JUGULAR;  Surgeon: Waynetta Sandy, MD;  Location: [place];  Service: Vascular;  Laterality: Right;  . OTHER SURGICAL HISTORY     testicular tumor- benign   . ROTATOR CUFF REPAIR Right   . WOUND DEBRIDEMENT  06/01/2011   Procedure: DEBRIDEMENT ABDOMINAL WOUND;  Surgeon: [name], MD;  Location: [facility];  Service: General;  Laterality: N/A;  Remove Sutures     History reviewed. No pertinent family history.  Social History:  reports that he quit smoking about 42 years ago. He has never used smokeless tobacco. He reports that he does not drink alcohol or use drugs.  Allergies:  Allergies  Allergen Reactions  . Penicillins Itching and Other (See Comments)    On arms only. Has patient had a PCN reaction causing immediate rash, facial/tongue/throat swelling, SOB or lightheadedness with hypotension: No Has patient had a PCN reaction causing severe rash involving mucus membranes or skin necrosis: No Has patient had a PCN reaction that required hospitalization No Has patient had a PCN reaction occurring within the last 10 years: No If all of the above answers are "NO", then may proceed with Cephalosporin use.     Medications: Prior to Admission medications   Medication Sig Start Date End Date Taking? Authorizing Provider  allopurinol (ZYLOPRIM) 100 MG tablet Take 100 mg by mouth daily.  07/23/15  Yes [provider]  amLODipine (NORVASC) 5 MG tablet Take 1 tablet (5 mg total) by mouth daily. 11/29/17  Yes Johnson, Clanford L, MD  aspirin EC 81 MG  tablet Take 1 tablet (81 mg total) by mouth every morning. 12/03/17  Yes Johnson, Clanford L, MD  ferrous sulfate 325 (65 FE) MG tablet Take 325 mg by mouth daily with breakfast.   Yes [provider]  finasteride (PROSCAR) 5 MG tablet Take 5 mg by mouth daily.   Yes [provider]  montelukast (SINGULAIR) 10 MG tablet Take 10 mg by mouth at bedtime.   Yes [provider]    Scheduled Meds: . allopurinol  100 mg Oral Daily  . aspirin EC  325 mg Oral Daily  . atorvastatin  40 mg Oral q1800  . [START ON 12/14/2017] calcitRIOL  0.25 mcg Oral Q M,W,F-HD  . Chlorhexidine Gluconate Cloth  6 each Topical Q0600  . donepezil  10 mg Oral QHS  . [START ON 12/14/2017] epoetin alfa  400 Units/kg Subcutaneous Q M,W,F-2000  . ferrous sulfate  325 mg Oral Q breakfast  . finasteride  5 mg Oral Daily  . heparin  5,000 Units Subcutaneous Q8H  .  montelukast  10 mg Oral QHS  . pantoprazole  40 mg Oral Daily   Continuous Infusions: PRN Meds:.acetaminophen **OR** acetaminophen (TYLENOL) oral liquid 160 mg/5 mL **OR** acetaminophen, albuterol, hydrALAZINE     Results for orders placed or performed during the hospital encounter of 12/12/17 (from the past 48 hour(s))  CBG monitoring, ED     Status: Abnormal   Collection Time: 12/12/17  5:30 PM  Result Value Ref Range   Glucose-Capillary 161 (H) 70 - 99 mg/dL  CBC     Status: Abnormal   Collection Time: 12/12/17  6:00 PM  Result Value Ref Range   WBC 9.7 4.0 - 10.5 K/uL   RBC 3.09 (L) 4.22 - 5.81 MIL/uL   Hemoglobin 8.7 (L) 13.0 - 17.0 g/dL   HCT 27.4 (L) 39.0 - 52.0 %   MCV 88.7 80.0 - 100.0 fL   MCH 28.2 26.0 - 34.0 pg   MCHC 31.8 30.0 - 36.0 g/dL   RDW 16.6 (H) 11.5 - 15.5 %   Platelets 258 150 - 400 K/uL   nRBC 0.0 0.0 - 0.2 %    Comment: Performed at [facility], [address], [place], [place] [address]  Differential     Status: Abnormal   Collection Time: 12/12/17  6:00 PM  Result Value Ref Range   Neutrophils  Relative % 77 %   Neutro Abs 7.5 1.7 - 7.7 K/uL   Lymphocytes Relative 11 %   Lymphs Abs 1.0 0.7 - 4.0 K/uL   Monocytes Relative 11 %   Monocytes Absolute 1.1 (H) 0.1 - 1.0 K/uL   Eosinophils Relative 1 %   Eosinophils Absolute 0.1 0.0 - 0.5 K/uL   Basophils Relative 0 %   Basophils Absolute 0.0 0.0 - 0.1 K/uL   Immature Granulocytes 0 %   Abs Immature Granulocytes 0.04 0.00 - 0.07 K/uL    Comment: Performed at [facility], [address], [place], [place] [address]  I-stat troponin, ED     Status: None   Collection Time: 12/12/17  6:02 PM  Result Value Ref Range   Troponin i, poc 0.01 0.00 - 0.08 ng/mL   Comment 3            Comment: Due to the release kinetics of cTnI, a negative result within the first hours of the onset of symptoms does not rule out myocardial infarction with certainty. If myocardial infarction is still suspected, repeat the test at appropriate intervals.   I-Stat Chem 8, ED     Status: Abnormal   Collection Time: 12/12/17  6:05 PM  Result Value Ref Range   Sodium 135 135 - 145 mmol/L   Potassium 3.3 (L) 3.5 - 5.1 mmol/L   Chloride 94 (L) 98 - 111 mmol/L   BUN 14 8 - 23 mg/dL   Creatinine, Ser 2.80 (H) 0.61 - 1.24 mg/dL   Glucose, Bld 181 (H) 70 - 99 mg/dL   Calcium, Ion 1.00 (L) 1.15 - 1.40 mmol/L   TCO2 35 (H) 22 - 32 mmol/L   Hemoglobin 8.8 (L) 13.0 - 17.0 g/dL   HCT 26.0 (L) 39.0 - 52.0 %  Comprehensive metabolic panel     Status: Abnormal   Collection Time: 12/12/17  7:25 PM  Result Value Ref Range   Sodium 137 135 - 145 mmol/L   Potassium 2.8 (L) 3.5 - 5.1 mmol/L   Chloride 97 (L) 98 - 111 mmol/L   CO2 28 22 - 32 mmol/L   Glucose, Bld  183 (H) 70 - 99 mg/dL   BUN 15 8 - 23 mg/dL   Creatinine, Ser 2.86 (H) 0.61 - 1.24 mg/dL   Calcium 8.5 (L) 8.9 - 10.3 mg/dL   Total Protein 6.6 6.5 - 8.1 g/dL   Albumin 3.1 (L) 3.5 - 5.0 g/dL   AST 20 15 - 41 U/L   ALT 19 0 - 44 U/L   Alkaline Phosphatase 99 38 - 126 U/L   Total Bilirubin 0.6 0.3 - 1.2  mg/dL   GFR calc non Af Amer 19 (L) >60 mL/min   GFR calc Af Amer 22 (L) >60 mL/min   Anion gap 12 5 - 15    Comment: Performed at [facility], [address] [place], [place] [address]  Protime-INR     Status: None   Collection Time: 12/12/17  7:25 PM  Result Value Ref Range   Prothrombin Time 13.4 11.4 - 15.2 seconds   INR 1.03     Comment: Performed at [facility], [address], [place], [place] [address]  APTT     Status: Abnormal   Collection Time: 12/12/17  7:25 PM  Result Value Ref Range   aPTT 37 (H) 24 - 36 seconds    Comment:        IF BASELINE aPTT IS ELEVATED, SUGGEST PATIENT RISK ASSESSMENT BE USED TO DETERMINE APPROPRIATE ANTICOAGULANT THERAPY. Performed at [facility], [address], [place], [place] [address]   Magnesium     Status: None   Collection Time: 12/12/17  7:25 PM  Result Value Ref Range   Magnesium 1.9 1.7 - 2.4 mg/dL    Comment: Performed at [facility], [address], [place], [place] [address]  Potassium     Status: Abnormal   Collection Time: 12/12/17 10:26 PM  Result Value Ref Range   Potassium 2.8 (L) 3.5 - 5.1 mmol/L    Comment: Performed at [facility], [address], [place], [place] [address]  Potassium     Status: Abnormal   Collection Time: 12/13/17 12:21 AM  Result Value Ref Range   Potassium 3.0 (L) 3.5 - 5.1 mmol/L    Comment: Performed at [facility], [address] [place],  [address]  Lipid panel     Status: None   Collection Time: 12/13/17  5:30 AM  Result Value Ref Range   Cholesterol 126 0 - 200 mg/dL   Triglycerides 86 <150 mg/dL   HDL 42 >40 mg/dL   Total CHOL/HDL Ratio 3.0 RATIO   VLDL 17 0 - 40 mg/dL   LDL Cholesterol 67 0 - 99 mg/dL    Comment:        Total Cholesterol/HDL:CHD Risk Coronary Heart Disease Risk Table                     Men   Women  1/2 Average Risk   3.4   3.3  Average Risk       5.0   4.4  2 X Average Risk   9.6   7.1  3 X Average Risk  23.4   11.0        Use the calculated  Patient Ratio above and the CHD Risk Table to determine the patient's CHD Risk.        ATP III CLASSIFICATION (LDL):  <100     mg/dL   Optimal  100-129  mg/dL   Near or Above  Optimal  130-159  mg/dL   Borderline  160-189  mg/dL   High  >190     mg/dL   Very High Performed at [facility] [place], [place] [address]   Renal function panel     Status: Abnormal   Collection Time: 12/13/17  5:30 AM  Result Value Ref Range   Sodium 136 135 - 145 mmol/L   Potassium 3.6 3.5 - 5.1 mmol/L   Chloride 101 98 - 111 mmol/L   CO2 26 22 - 32 mmol/L   Glucose, Bld 124 (H) 70 - 99 mg/dL   BUN 22 8 - 23 mg/dL   Creatinine, Ser 3.76 (H) 0.61 - 1.24 mg/dL   Calcium 8.9 8.9 - 10.3 mg/dL   Phosphorus 2.8 2.5 - 4.6 mg/dL   Albumin 2.8 (L) 3.5 - 5.0 g/dL   GFR calc non Af Amer 14 (L) >60 mL/min   GFR calc Af Amer 16 (L) >60 mL/min   Anion gap 9 5 - 15    Comment: Performed at [facility], [address] [place], [place] [address]  Glucose, capillary     Status: Abnormal   Collection Time: 12/13/17  4:08 PM  Result Value Ref Range   Glucose-Capillary 118 (H) 70 - 99 mg/dL    Studies/Results:  CAROTID  IMPRESSION: 1. No significant atherosclerotic plaque or evidence of stenosis in either internal carotid artery. 2. Minimal heterogeneous plaque at the carotid bifurcations bilaterally. 3. Vertebral arteries are patent with normal antegrade flow     BRAIN MRI FINDINGS: Brain: No acute infarction, hemorrhage, hydrocephalus, or mass lesion. Cerebral volume loss and moderate chronic small vessel ischemia in the cerebral white matter. Prominent bifrontal extra-axial CSF suspicious for hygromas, although cortical vessel displacement is not well demonstrated. This appearance is non progressed since at least April 2019.  Vascular: Major flow voids are preserved  Skull and upper cervical spine: Diffuse hypointense marrow space most likely from patient's  end-stage renal disease.  Sinuses/Orbits: Negative  IMPRESSION: 1. No acute finding. 2. Atrophy and chronic small vessel ischemia      The brain MRI is reviewed in person.  There is severe global atrophy.  There is a moderate confluent and periventricular leukoencephalopathy consistent with chronic microvascular changes.  Nothing acute is seen on DWI.       Heli Dino A. Miguel Tucker, [name]  Diplomate, Tax adviser of Psychiatry and Neurology ( Neurology). 12/13/2017, 8:13 PM

## 2017-12-13 NOTE — Progress Notes (Signed)
Admission questions answered by pt and wife.

## 2017-12-13 NOTE — Progress Notes (Signed)
PROGRESS NOTE  Miguel Tucker NLZ:[id number] DOB: 04-Dec-1930 DOA: 12/12/2017 PCP: [name], MD  Brief History:  84 year old male with a history of ESRD, dementia, diabetes mellitus type 2, hypertension, hyperlipidemia presenting with left arm weakness.  Patient was last known normal around 7 PM on 12/11/2016.  Because of the patient's cognitive impairment, he is unable to provide any history.  The patient's son was going to take the patient to his normal hemodialysis on the morning of 12/12/2017.  He noted that the patient cannot lift his left arm and had difficulty getting his arm up to use the walker.  The patient subsequently went to dialysis and had a full session.  Subsequently, the patient in the emergency department.  Work-up including CT of the brain was negative for acute findings.  BMP, LFTs, and CBC were essentially unremarkable with hemoglobin at baseline.  The patient was admitted for further work-up.  EDP spoke with neurology, Dr. Leonel Ramsay who felt the patient could remain at [facility] for further stroke work-up.  Assessment/Plan: Left upper extremity hemiparesis -Concerned about ischemic stroke -PT/OT evaluation -Speech therapy eval -CT brain-- -MRI brain-- -MRA brain-- -Carotid Duplex-- -Echo-- -LDL--67 -HbA1C-- -Antiplatelet--aspirin 325 mg daily  Fever -blood cultures x 2 sets -viral respiratory panel -CXR  ESRD -Patient receives dialysis Monday, Wednesday, Friday -Consult nephrology as the patient is anticipated to stay until 12/14/2017  Essential hypertension -Allow for permissive hypertension -Discontinue furosemide -Discontinue labetalol and amlodipine  Diabetes mellitus type 2 -Hemoglobin A1c -09/06/2017 hemoglobin A1c 5.9  Hyperlipidemia -Continue statin -LDL 67  Dementia without behavioral disturbance -Continue Aricept  Anemia of CKD -Baseline hemoglobin~9 -Monitor CBC  Hypokalemia -repleted    Disposition Plan:    SNF 1-2 days Family Communication:   No Family at bedside  Consultants:  Neurology, renal  Code Status:  FULL  DVT Prophylaxis:  [place] Heparin    Procedures: As Listed in Progress Note Above  Antibiotics: None       Subjective: The patient is pleasantly confused but is able to answer questions appropriately.  He denies any headache, chest pain, shortness breath, nausea, vomiting, diarrhea,, pain, dysuria, hematuria.  There is no headache.  Objective: Vitals:   12/13/17 0058 12/13/17 0301 12/13/17 0500 12/13/17 0657  BP: (!) 116/52 134/64 130/72 129/73  Pulse: 81 77 77 82  Resp:      Temp: 99.1 F (37.3 C) 98.7 F (37.1 C) 98.8 F (37.1 C) 98.8 F (37.1 C)  TempSrc: Oral Oral Oral Oral  SpO2: 100% 100% 100% 100%  Weight:      Height:        Intake/Output Summary (Last 24 hours) at 12/13/2017 9024 Last data filed at 12/13/2017 0551 Gross per 24 hour  Intake -  Output 200 ml  Net -200 ml   Weight change:  Exam:   General:  Pt is alert, follows commands appropriately, not in acute distress  HEENT: No icterus, No thrush, No neck mass, [place]/AT  Cardiovascular: RRR, S1/S2, no rubs, no gallops  Respiratory: CTA bilaterally, no wheezing, no crackles, no rhonchi  Abdomen: Soft/+BS, non tender, non distended, no guarding  Extremities: No edema, No lymphangitis, No petechiae, No rashes, no synovitis  Neuro:  CN II-XII intact, strength 4/5 in RUE, RLE, strength 3/5 LUE,  5/5LLE; sensation intact bilateral; no dysmetria; babinski equivocal     Data Reviewed: I have personally reviewed following labs and imaging studies Basic Metabolic Panel: Recent Labs  Lab 12/12/17 1805 12/12/17 1925 12/12/17 2226 12/13/17 0021 12/13/17 0530  NA 135 137  --   --  136  K 3.3* 2.8* 2.8* 3.0* 3.6  CL 94* 97*  --   --  101  CO2  --  28  --   --  26  GLUCOSE 181* 183*  --   --  124*  BUN 14 15  --   --  22  CREATININE 2.80* 2.86*  --   --  3.76*  CALCIUM  --  8.5*  --    --  8.9  MG  --  1.9  --   --   --   PHOS  --   --   --   --  2.8   Liver Function Tests: Recent Labs  Lab 12/12/17 1925 12/13/17 0530  AST 20  --   ALT 19  --   ALKPHOS 99  --   BILITOT 0.6  --   PROT 6.6  --   ALBUMIN 3.1* 2.8*   No results for input(s): LIPASE, AMYLASE in the last 168 hours. No results for input(s): AMMONIA in the last 168 hours. Coagulation Profile: Recent Labs  Lab 12/12/17 1925  INR 1.03   CBC: Recent Labs  Lab 12/12/17 1800 12/12/17 1805  WBC 9.7  --   NEUTROABS 7.5  --   HGB 8.7* 8.8*  HCT 27.4* 26.0*  MCV 88.7  --   PLT 258  --    Cardiac Enzymes: No results for input(s): CKTOTAL, CKMB, CKMBINDEX, TROPONINI in the last 168 hours. BNP: Invalid input(s): POCBNP CBG: Recent Labs  Lab 12/12/17 1730  GLUCAP 161*   HbA1C: No results for input(s): HGBA1C in the last 72 hours. Urine analysis:    Component Value Date/Time   COLORURINE YELLOW 09/06/2017 1600   APPEARANCEUR CLEAR 09/06/2017 1600   LABSPEC 1.011 09/06/2017 1600   PHURINE 5.0 09/06/2017 1600   GLUCOSEU NEGATIVE 09/06/2017 1600   HGBUR MODERATE (A) 09/06/2017 1600   BILIRUBINUR NEGATIVE 09/06/2017 1600   KETONESUR NEGATIVE 09/06/2017 1600   PROTEINUR 100 (A) 09/06/2017 1600   UROBILINOGEN 0.2 02/08/2008 0204   NITRITE NEGATIVE 09/06/2017 1600   LEUKOCYTESUR NEGATIVE 09/06/2017 1600   Sepsis Labs: @LABRCNTIP (procalcitonin:4,lacticidven:4) )No results found for this or any previous visit (from the past 240 hour(s)).   Scheduled Meds: . allopurinol  100 mg Oral Daily  . aspirin EC  325 mg Oral Daily  . atorvastatin  40 mg Oral q1800  . donepezil  10 mg Oral QHS  . ferrous sulfate  325 mg Oral Q breakfast  . finasteride  5 mg Oral Daily  . heparin  5,000 Units Subcutaneous Q8H  . montelukast  10 mg Oral QHS  . pantoprazole  40 mg Oral Daily   Continuous Infusions:  Procedures/Studies: Dg Chest 2 View  Result Date: 11/25/2017 CLINICAL DATA:  Weakness EXAM:  CHEST - 2 VIEW COMPARISON:  Chest x-rays dated 10/18/2017 and 09/06/2017. FINDINGS: Heart size and mediastinal contours are stable. Lungs are clear. No pleural effusion or pneumothorax seen. RIGHT-sided central catheter is stable in position with tip at the level of the lower SVC. No acute or suspicious osseous finding. IMPRESSION: No active cardiopulmonary disease. Electronically Signed   By: Franki Cabot [name]   On: 11/25/2017 18:51   Ct Head Wo Contrast  Result Date: 12/12/2017 CLINICAL DATA:  Left arm weakness that started yesterday. History of dementia EXAM: CT HEAD WITHOUT CONTRAST TECHNIQUE: Contiguous axial images were obtained  from the base of the skull through the vertex without intravenous contrast. COMPARISON:  09/06/2017 FINDINGS: Brain: No evidence of acute infarction, hemorrhage, hydrocephalus, extra-axial collection or mass lesion/mass effect. Atrophy with frontal predilection. Chronic small vessel ischemia with confluent low-density in the cerebral white matter. Vascular: No hyperdense vessel or unexpected calcification. Skull: Normal. Negative for fracture or focal lesion. Sinuses/Orbits: No acute finding. IMPRESSION: 1. No acute finding. 2. Frontal predominant atrophy. Electronically Signed   By: Monte Fantasia [name]   On: 12/12/2017 18:48   Vas US Duplex Dialysis Access (avf, Avg)  Result Date: 11/22/2017 DIALYSIS ACCESS Reason for Exam: Unable to dialyze through AVF/AVG. Access Site: Left Upper Extremity. Access Type: Upper arm brachial axillary 4-46mm AV graft. History: Failed basilic vein fistula. Performing Technologist: Burley Saver RVT  Examination Guidelines: A complete evaluation includes B-mode imaging, spectral Doppler, color Doppler, and power Doppler as needed of all accessible portions of each vessel. Unilateral testing is considered an integral part of a complete examination. Limited examinations for reoccurring indications may be performed as noted.  Findings:    +--------------------+----------+-----------------+-------------+ AVG                 PSV (cm/s)Flow Vol (mL/min)  Describe    +--------------------+----------+-----------------+-------------+ Native artery inflow   250          1903                     +--------------------+----------+-----------------+-------------+ Arterial anastomosis   591                                   +--------------------+----------+-----------------+-------------+ Prox graft             493                                   +--------------------+----------+-----------------+-------------+ Mid graft              160                     0.49 cm depth +--------------------+----------+-----------------+-------------+ Distal graft           158                                   +--------------------+----------+-----------------+-------------+ Venous anastomosis     149                                   +--------------------+----------+-----------------+-------------+ Venous outflow         179                                   +--------------------+----------+-----------------+-------------+  Summary: Patent arteriovenous graft.  Elevated velocities noted in the distal upper arm near the arterial anastomosis, possibly due to vessel diameter.  *See table(s) above for measurements and observations.  Diagnosing physician: Curt Jews MD Electronically signed by Curt Jews MD on 11/22/2017 at 2:54:09 PM.   --------------------------------------------------------------------------------   Final     Orson Eva, DO  [facility] Pager [phone]  If 7PM-7AM, please contact night-coverage [email or web address] Password [email or web address] 12/13/2017, 7:18 AM   LOS: 0 days

## 2017-12-13 NOTE — Care Management Obs Status (Signed)
[place] NOTIFICATION   Patient Details  Name: Miguel Tucker MRN: [id number] Date of Birth: 1930/07/03   Medicare Observation Status Notification Given:  Yes    Sherald Barge, RN 12/13/2017, 4:04 PM

## 2017-12-14 DIAGNOSIS — N186 End stage renal disease: Secondary | ICD-10-CM | POA: Diagnosis not present

## 2017-12-14 DIAGNOSIS — K219 Gastro-esophageal reflux disease without esophagitis: Secondary | ICD-10-CM

## 2017-12-14 DIAGNOSIS — I10 Essential (primary) hypertension: Secondary | ICD-10-CM

## 2017-12-14 DIAGNOSIS — R69 Illness, unspecified: Secondary | ICD-10-CM | POA: Diagnosis not present

## 2017-12-14 DIAGNOSIS — F0390 Unspecified dementia without behavioral disturbance: Secondary | ICD-10-CM

## 2017-12-14 DIAGNOSIS — N400 Enlarged prostate without lower urinary tract symptoms: Secondary | ICD-10-CM | POA: Diagnosis not present

## 2017-12-14 DIAGNOSIS — N185 Chronic kidney disease, stage 5: Secondary | ICD-10-CM

## 2017-12-14 DIAGNOSIS — E1122 Type 2 diabetes mellitus with diabetic chronic kidney disease: Secondary | ICD-10-CM | POA: Diagnosis not present

## 2017-12-14 DIAGNOSIS — M1A3120 Chronic gout due to renal impairment, left shoulder, without tophus (tophi): Secondary | ICD-10-CM

## 2017-12-14 DIAGNOSIS — E782 Mixed hyperlipidemia: Secondary | ICD-10-CM | POA: Diagnosis not present

## 2017-12-14 DIAGNOSIS — R29898 Other symptoms and signs involving the musculoskeletal system: Secondary | ICD-10-CM

## 2017-12-14 LAB — RENAL FUNCTION PANEL
ANION GAP: 11 (ref 5–15)
Albumin: 2.8 g/dL — ABNORMAL LOW (ref 3.5–5.0)
BUN: 39 mg/dL — ABNORMAL HIGH (ref 8–23)
CO2: 23 mmol/L (ref 22–32)
Calcium: 9.2 mg/dL (ref 8.9–10.3)
Chloride: 102 mmol/L (ref 98–111)
Creatinine, Ser: 5.77 mg/dL — ABNORMAL HIGH (ref 0.61–1.24)
GFR calc Af Amer: 10 mL/min — ABNORMAL LOW (ref >60)
GFR calc non Af Amer: 8 mL/min — ABNORMAL LOW (ref >60)
Glucose, Bld: 215 mg/dL — ABNORMAL HIGH (ref 70–99)
Phosphorus: 3.3 mg/dL (ref 2.5–4.6)
Potassium: 3.8 mmol/L (ref 3.5–5.1)
SODIUM: 136 mmol/L (ref 135–145)

## 2017-12-14 LAB — GLUCOSE, CAPILLARY
GLUCOSE-CAPILLARY: 92 mg/dL (ref 70–99)
GLUCOSE-CAPILLARY: 148 mg/dL — AB (ref 70–99)
Glucose-Capillary: 139 mg/dL — ABNORMAL HIGH (ref 70–99)

## 2017-12-14 LAB — CBC
HCT: 26.3 % — ABNORMAL LOW (ref 39.0–52.0)
Hemoglobin: 8.1 g/dL — ABNORMAL LOW (ref 13.0–17.0)
MCH: 27.8 pg (ref 26.0–34.0)
MCHC: 30.8 g/dL (ref 30.0–36.0)
MCV: 90.4 fL (ref 80.0–100.0)
NRBC: 0 % (ref 0.0–0.2)
PLATELETS: 236 10*3/uL (ref 150–400)
RBC: 2.91 MIL/uL — ABNORMAL LOW (ref 4.22–5.81)
RDW: 16.6 % — ABNORMAL HIGH (ref 11.5–15.5)
WBC: 7.3 10*3/uL (ref 4.0–10.5)

## 2017-12-14 LAB — URIC ACID: Uric Acid, Serum: 4 mg/dL (ref 3.7–8.6)

## 2017-12-14 MED ORDER — EPOETIN ALFA 10000 UNIT/ML IJ SOLN
20000.0000 [IU] | INTRAMUSCULAR | Status: DC
  Administered 2017-12-14: 20000 [IU] via INTRAVENOUS
  Filled 2017-12-14: qty 2

## 2017-12-14 MED ORDER — EPOETIN ALFA 10000 UNIT/ML IJ SOLN: 20000.0000 [IU] | INTRAMUSCULAR | Status: DC

## 2017-12-14 MED ORDER — SODIUM CHLORIDE 0.9 % IV SOLN: 100.0000 mL | INTRAVENOUS | Status: DC | PRN

## 2017-12-14 MED ORDER — PANTOPRAZOLE SODIUM 40 MG PO TBEC: 40.0000 mg | DELAYED_RELEASE_TABLET | Freq: Every day | ORAL | Status: DC

## 2017-12-14 MED ORDER — PENTAFLUOROPROP-TETRAFLUOROETH EX AERO: 1.0000 "application " | INHALATION_SPRAY | CUTANEOUS | Status: DC | PRN

## 2017-12-14 MED ORDER — ATORVASTATIN CALCIUM 40 MG PO TABS: 40.0000 mg | ORAL_TABLET | Freq: Every day | ORAL | 1 refills | Status: DC

## 2017-12-14 MED ORDER — COLCHICINE 0.6 MG PO TABS: 0.6000 mg | ORAL_TABLET | Freq: Two times a day (BID) | ORAL | 1 refills | Status: DC

## 2017-12-14 MED ORDER — LIDOCAINE HCL (PF) 1 % IJ SOLN: 5.0000 mL | INTRAMUSCULAR | Status: DC | PRN

## 2017-12-14 MED ORDER — HEPARIN SODIUM (PORCINE) 1000 UNIT/ML DIALYSIS
20.0000 [IU]/kg | INTRAMUSCULAR | Status: DC | PRN
  Administered 2017-12-14: 1500 [IU] via INTRAVENOUS_CENTRAL
  Filled 2017-12-14: qty 2

## 2017-12-14 MED ORDER — COLCHICINE 0.6 MG PO TABS
0.6000 mg | ORAL_TABLET | Freq: Two times a day (BID) | ORAL | Status: DC
  Administered 2017-12-14: 0.6 mg via ORAL
  Filled 2017-12-14: qty 1

## 2017-12-14 MED ORDER — LIDOCAINE-PRILOCAINE 2.5-2.5 % EX CREA: 1.0000 "application " | TOPICAL_CREAM | CUTANEOUS | Status: DC | PRN

## 2017-12-14 MED ORDER — CALCITRIOL 0.25 MCG PO CAPS: 0.2500 ug | ORAL_CAPSULE | ORAL | 1 refills | Status: DC

## 2017-12-14 MED ORDER — ALTEPLASE 2 MG IJ SOLR: 2.0000 mg | Freq: Once | INTRAMUSCULAR | Status: DC | PRN

## 2017-12-14 MED ORDER — DONEPEZIL HCL 5 MG PO TABS: 10.0000 mg | ORAL_TABLET | Freq: Every day | ORAL | Status: DC

## 2017-12-14 MED ORDER — HEPARIN SODIUM (PORCINE) 1000 UNIT/ML DIALYSIS: 1000.0000 [IU] | INTRAMUSCULAR | Status: DC | PRN

## 2017-12-14 NOTE — Care Management Note (Signed)
Case Management Note  Patient Details  Name: Miguel Tucker MRN: [id number] Date of Birth: 1930/08/14  Action/Plan: DC home today. POA has called back and wants to use [facility]. Aware HH has 48 hrs to make first visit. Santiago Glad, [facility] rep, given referral.   Expected Discharge Date:  12/13/17               Expected Discharge Plan:  [place]  In-House Referral:  NA  Discharge planning Services  CM Consult  Post Acute Care Choice:  Home Health Choice offered to:  [place] / [facility]  HH Arranged:  PT, OT HH Agency:  [place]  Status of Service:  Completed, signed off   Sherald Barge, RN 12/14/2017, 12:57 PM

## 2017-12-14 NOTE — Procedures (Signed)
Patient was seen on dialysis and the procedure was supervised.  BFR 400  Via AVG BP is  136/68.   Patient appears to be tolerating treatment well- only a small amount over EDW- will challenge   Louis Meckel 12/14/2017

## 2017-12-14 NOTE — Progress Notes (Signed)
Occupational Therapy Treatment Patient Details Name: Miguel Tucker MRN: [id number] DOB: 09-23-1930 Today's Date: 12/14/2017    History of present illness RESEAN BRANDER is a 84 y.o. male with medical history significant of asthma, ESRD on HD, dementia, type 2 diabetes, hypertension, hyperlipidemia presenting to the hospital for evaluation of left arm weakness.  Patient reports having weakness in his left upper extremity.  Denies having any loss of sensation or tingling.  Per son at bedside, patient was last seen in his usual state of health yesterday evening at around 7 PM.  This morning when patient's son went to his house to take him for dialysis, he noted that the patient could not lift his left arm.  Patient did go for his full session of dialysis.  Son also noticed that patient had difficulty using his walker to ambulate as he could not use his left upper extremity to grasp the bar.  He did not notice any leg weakness or dragging of leg.  Patient denies any prior history of stroke.   OT comments  Pt performing standing ADLs at supervision level today, requiring occasional cuing to incorporate LUE. Pt continues to have weakness and limited ROM of LUE, increased edema noted from hand to shoulder today. Pt does not volitionally attempt to use LUE during ADLs. Discharge plan remains appropriate.    Follow Up Recommendations  Outpatient OT;Home health OT;Supervision/Assistance - 24 hour    Equipment Recommendations  Tub/shower seat       Precautions / Restrictions Precautions Precautions: Fall Restrictions Weight Bearing Restrictions: No       Mobility Bed Mobility Overal bed mobility: Needs Assistance Bed Mobility: Supine to Sit     Supine to sit: Min assist     General bed mobility comments: had to use RUE to pull self up with therapist assist  Transfers Overall transfer level: Needs assistance Equipment used: Rolling walker (2 wheeled) Transfers: Sit to/from  [employer] Transfers Sit to Stand: Supervision Stand pivot transfers: Supervision                ADL either performed or assessed with clinical judgement   ADL Overall ADL's : Needs assistance/impaired Eating/Feeding: Set up;Sitting Eating/Feeding Details (indicate cue type and reason): assistance opening packages and preparing food-mostly due to cognition. Pt did not attempt to use LUE as assist for any feeding tasks Grooming: Wash/dry hands;Supervision/safety;Standing Grooming Details (indicate cue type and reason): Pt able to lift left arm to sink for hand washing             Lower Body Dressing: Maximal assistance;Sitting/lateral leans Lower Body Dressing Details (indicate cue type and reason): OT donned socks while pt seated at EOB.  Toilet Transfer: Supervision/safety;Ambulation;Regular Toilet;RW   Toileting- Clothing Manipulation and Hygiene: Supervision/safety;Sitting/lateral lean       Functional mobility during ADLs: Min guard;Rolling walker General ADL Comments: Pt requiring increased assistance due to limited ability to use LUE to assist with ADLs               Cognition Arousal/Alertness: Awake/alert Behavior During Therapy: WFL for tasks assessed/performed Overall Cognitive Status: Within Functional Limits for tasks assessed                                                     Pertinent Vitals/ Pain  Pain Assessment: No/denies pain         Frequency  Min 2X/week        Progress Toward Goals  OT Goals(current goals can now be found in the care plan section)  Progress towards OT goals: Progressing toward goals  Acute Rehab OT Goals Patient Stated Goal: return home with family to assist OT Goal Formulation: With patient Time For Goal Achievement: 12/27/17 Potential to Achieve Goals: Good ADL Goals Pt Will Perform Eating: with modified independence;sitting Pt Will Perform Grooming: with  set-up;sitting Pt Will Transfer to Toilet: with modified independence;ambulating;regular height toilet Pt Will Perform Toileting - Clothing Manipulation and hygiene: with modified independence;sitting/lateral leans;sit to/from stand Pt/caregiver will Perform Home Exercise Program: Increased strength;Left upper extremity;Independently;With written HEP provided  Plan Discharge plan remains appropriate       AM-PAC OT "6 Clicks" Daily Activity     Outcome Measure   Help from another person eating meals?: A Little Help from another person taking care of personal grooming?: A Little Help from another person toileting, which includes using toliet, bedpan, or urinal?: A Little Help from another person bathing (including washing, rinsing, drying)?: A Little Help from another person to put on and taking off regular upper body clothing?: A Little Help from another person to put on and taking off regular lower body clothing?: A Little 6 Click Score: 18    End of Session Equipment Utilized During Treatment: Rolling walker  OT Visit Diagnosis: Muscle weakness (generalized) (M62.81)(left monoplegia)   Activity Tolerance Patient tolerated treatment well   Patient Left in chair;with call bell/phone within reach;with chair alarm set   Nurse Communication          Time: [phone] OT Time Calculation (min): 19 min  Charges: OT General Charges $OT Visit: 1 Visit OT Treatments $Self Care/Home Management : 8-22 mins   [name], OTR/L  [phone] 12/14/2017, 8:37 AM

## 2017-12-14 NOTE — Procedures (Addendum)
      HEMODIALYSIS TREATMENT NOTE (Wed 12/14/17):  4 hour low-heparin dialysis completed via left upper arm AVG (15g ante/retrograde). Goal challenge met:  2.5 liters removed without interruption in ultrafiltration.  Hemodynamically stable throughout HD session.  All blood was returned and hemostasis was achieved in 15 minutes.  Right IJ tunneled catheter dressing changed; site unremarkable.  Post-HD weight 67.1 kg.  Rockwell Alexandria, RN

## 2017-12-14 NOTE — Progress Notes (Signed)
Discharge instructions reviewed with patient and his son Ted Goodner, Brooke Bonito at bedside. Given AVS and colchicine prescription. Patient's son aware other prescriptions have been sent to his pharamacy. Verbalized understanding of instructions, when to seek medical attention and states he will call PCP office for follow-up appointment. Stated he is aware of home health setup per case management and they will call him to arrange visits. IV site d/c'd, site within normal limits. Pt left floor in stable condition via w/c accompanied by nurse tech. Donavan Foil, RN

## 2017-12-14 NOTE — Progress Notes (Signed)
Subjective:  Arm issue is felt to be musculoskeletal- no CVA found on imaging- for possible discharge.  Hand is swollen today - not sure I noticed that yesterday- has synovitis which is tender Objective Vital signs in last 24 hours: Vitals:   12/13/17 2104 12/14/17 0102 12/14/17 0506 12/14/17 0859  BP: 134/68 132/68 (!) 156/91 136/68  Pulse: 79 81 83 86  Resp:    17  Temp: 98.9 F (37.2 C) 98.8 F (37.1 C) 98.7 F (37.1 C) 98.2 F (36.8 C)  TempSrc: Oral Oral Oral Oral  SpO2: 97% 100% 100% 100%  Weight:      Height:       Weight change:   Intake/Output Summary (Last 24 hours) at 12/14/2017 0902 Last data filed at 12/13/2017 1700 Gross per 24 hour  Intake 480 ml  Output -  Net 480 ml     Assessment/Plan: 84 year old black male with diabetes, hypertension and ESRD.  Now presenting with acute left arm weakness suspicious for CVA 1 new onset acute left arm weakness-suspicious for CVA.  Work-up in progress-has had some functional improvement.  Is on full dose aspirin.  Imaging neg for CVA- Now felt to be an orthopedic issue with dec ROM- OT has evaluated, plan is for home health.  Some question of gout  (pt with no history) but uric acid not obtained, will order uric acid and also give colchicine.  Do not feel it is a steal issue associated with access as have been using without incident 2 ESRD: Normally MWF-[place]-we will continue schedule and plan for routine hemodialysis today via left upper arm graft.  TDC is ready for removal but cannot get it done as inpatient unfortunately-will need to be arranged as outpatient 3 Hypertension: Blood pressure well controlled.  Volume status seems fine.  At the outpatient center there are 3 antihypertensives listed, however not on any here with a good blood pressure-continue no meds 4. Anemia of ESRD: Was noted to have recent GI bleed.  Even though hemoglobin is low it is better than what it was as an outpatient.  Due for ESA, will dose tomorrow.   Status post iron repletion as outpatient 5. Metabolic Bone Disease: Had Renvela on med list as outpatient but phosphorus also very good-we will hold for now 6.  Hypokalemia-were likely seeing post dialysis potassium.  Thankfully repletion did not give hyperkalemia.  2K bath today     Louis Meckel    Labs: Basic Metabolic Panel: Recent Labs  Lab 12/12/17 1805 12/12/17 1925 12/12/17 2226 12/13/17 0021 12/13/17 0530  NA 135 137  --   --  136  K 3.3* 2.8* 2.8* 3.0* 3.6  CL 94* 97*  --   --  101  CO2  --  28  --   --  26  GLUCOSE 181* 183*  --   --  124*  BUN 14 15  --   --  22  CREATININE 2.80* 2.86*  --   --  3.76*  CALCIUM  --  8.5*  --   --  8.9  PHOS  --   --   --   --  2.8   Liver Function Tests: Recent Labs  Lab 12/12/17 1925 12/13/17 0530  AST 20  --   ALT 19  --   ALKPHOS 99  --   BILITOT 0.6  --   PROT 6.6  --   ALBUMIN 3.1* 2.8*   No results for input(s): LIPASE, AMYLASE in the last  168 hours. No results for input(s): AMMONIA in the last 168 hours. CBC: Recent Labs  Lab 12/12/17 1800 12/12/17 1805  WBC 9.7  --   NEUTROABS 7.5  --   HGB 8.7* 8.8*  HCT 27.4* 26.0*  MCV 88.7  --   PLT 258  --    Cardiac Enzymes: No results for input(s): CKTOTAL, CKMB, CKMBINDEX, TROPONINI in the last 168 hours. CBG: Recent Labs  Lab 12/12/17 1730 12/13/17 1608 12/13/17 2130 12/14/17 0752  GLUCAP 161* 118* 119* 92    Iron Studies: No results for input(s): IRON, TIBC, TRANSFERRIN, FERRITIN in the last 72 hours. Studies/Results: Dg Chest 1 View  Result Date: 12/13/2017 CLINICAL DATA:  Fever EXAM: CHEST  1 VIEW COMPARISON:  11/25/2017 FINDINGS: Right dialysis catheter in place with the tip at the cavoatrial junction, stable. Cardiomegaly with vascular congestion. Low lung volumes with bibasilar atelectasis. No visible significant effusions or acute bony abnormality. IMPRESSION: Low lung volumes. Mild cardiomegaly, vascular congestion and bibasilar  atelectasis. Electronically Signed   By: Rolm Baptise [name]   On: 12/13/2017 09:09   Ct Head Wo Contrast  Result Date: 12/12/2017 CLINICAL DATA:  Left arm weakness that started yesterday. History of dementia EXAM: CT HEAD WITHOUT CONTRAST TECHNIQUE: Contiguous axial images were obtained from the base of the skull through the vertex without intravenous contrast. COMPARISON:  09/06/2017 FINDINGS: Brain: No evidence of acute infarction, hemorrhage, hydrocephalus, extra-axial collection or mass lesion/mass effect. Atrophy with frontal predilection. Chronic small vessel ischemia with confluent low-density in the cerebral white matter. Vascular: No hyperdense vessel or unexpected calcification. Skull: Normal. Negative for fracture or focal lesion. Sinuses/Orbits: No acute finding. IMPRESSION: 1. No acute finding. 2. Frontal predominant atrophy. Electronically Signed   By: Monte Fantasia [name]   On: 12/12/2017 18:48   Mr Brain Wo Contrast  Result Date: 12/13/2017 CLINICAL DATA:  Altered mental status for 2 days.  No known injury EXAM: MRI HEAD WITHOUT CONTRAST TECHNIQUE: Multiplanar, multiecho pulse sequences of the brain and surrounding structures were obtained without intravenous contrast. COMPARISON:  Head CT from yesterday FINDINGS: Brain: No acute infarction, hemorrhage, hydrocephalus, or mass lesion. Cerebral volume loss and moderate chronic small vessel ischemia in the cerebral white matter. Prominent bifrontal extra-axial CSF suspicious for hygromas, although cortical vessel displacement is not well demonstrated. This appearance is non progressed since at least April 2019. Vascular: Major flow voids are preserved Skull and upper cervical spine: Diffuse hypointense marrow space most likely from patient's end-stage renal disease. Sinuses/Orbits: Negative IMPRESSION: 1. No acute finding. 2. Atrophy and chronic small vessel ischemia. Electronically Signed   By: Monte Fantasia [name]   On: 12/13/2017 08:56   US  Carotid Bilateral  Result Date: 12/13/2017 CLINICAL DATA:  84 year old male with stroke-like symptoms EXAM: BILATERAL CAROTID DUPLEX ULTRASOUND TECHNIQUE: Pearline Cables scale imaging, color Doppler and duplex ultrasound were performed of bilateral carotid and vertebral arteries in the neck. COMPARISON:  Brain MRI 12/13/2017 FINDINGS: Criteria: Quantification of carotid stenosis is based on velocity parameters that correlate the residual internal carotid diameter with NASCET-based stenosis levels, using the diameter of the distal internal carotid lumen as the denominator for stenosis measurement. The following velocity measurements were obtained: RIGHT ICA: 50/16 cm/sec CCA: 67/67 cm/sec SYSTOLIC ICA/CCA RATIO:  0.7 ECA:  56 cm/sec LEFT ICA: 64/17 cm/sec CCA: 20/94 cm/sec SYSTOLIC ICA/CCA RATIO:  0.8 ECA:  56 cm/sec RIGHT CAROTID ARTERY: Trace smooth heterogeneous atherosclerotic plaque in the carotid bifurcation. No significant plaque or evidence of stenosis in the  internal carotid artery. RIGHT VERTEBRAL ARTERY:  Patent with normal antegrade flow. LEFT CAROTID ARTERY: No significant atherosclerotic plaque or evidence of stenosis in the internal carotid artery. LEFT VERTEBRAL ARTERY:  Patent with normal antegrade flow. IMPRESSION: 1. No significant atherosclerotic plaque or evidence of stenosis in either internal carotid artery. 2. Minimal heterogeneous plaque at the carotid bifurcations bilaterally. 3. Vertebral arteries are patent with normal antegrade flow. Electronically Signed   By: Jacqulynn Cadet [name]   On: 12/13/2017 09:55   Medications: Infusions: . sodium chloride    . sodium chloride      Scheduled Medications: . allopurinol  100 mg Oral Daily  . aspirin EC  325 mg Oral Daily  . atorvastatin  40 mg Oral q1800  . calcitRIOL  0.25 mcg Oral Q M,W,F-HD  . Chlorhexidine Gluconate Cloth  6 each Topical Q0600  . donepezil  10 mg Oral QHS  . epoetin alfa  20,000 Units Intravenous Once per day on Mon  Wed Fri  . ferrous sulfate  325 mg Oral Q breakfast  . finasteride  5 mg Oral Daily  . heparin  5,000 Units Subcutaneous Q8H  . montelukast  10 mg Oral QHS  . pantoprazole  40 mg Oral Daily    have reviewed scheduled and prn medications.  Physical Exam: General: NAD Heart: RRR Lungs: clear Abdomen: soft, non tender Extremities: no LE edema- right hand with synovitis that appears painful Dialysis Access: left upper arm AVG and right sided TDC    12/14/2017,9:02 AM  LOS: 0 days

## 2017-12-14 NOTE — Discharge Summary (Signed)
Physician Discharge Summary  Miguel Tucker YQI:[id number] DOB: May 22, 1930 DOA: 12/12/2017  PCP: [name], MD  Admit date: 12/12/2017 Discharge date: 12/14/2017  Time spent: 35 minutes  Recommendations for Outpatient Follow-up:  Repeat basic metabolic panel to follow electrolytes (patient had mild hypokalemia during hospitalization). Pete CBC to follow hemoglobin trend.  Discharge Diagnoses:  Principal Problem:   Weakness of left upper extremity Active Problems:   Diabetes mellitus (HCC)   HTN (hypertension)   Anemia, chronic disease   Dementia (HCC)   Hypokalemia   ESRD (end stage renal disease) (HCC)   HLD (hyperlipidemia)   Gout   BPH (benign prostatic hyperplasia)   Asthma   GERD (gastroesophageal reflux disease)   Discharge Condition: Stable and improved.  Patient discharged home with instruction to follow-up with PCP in 10 days.  Diet recommendation: Modified carbohydrate heart healthy diet.  Filed Weights   12/12/17 1701 12/14/17 0920 12/14/17 1345  Weight: 72.6 kg 70.4 kg 67.1 kg    History of present illness:  Patient be written by Dr. Marlowe Tucker on 12/12/2017 84 y.o. male with medical history significant of asthma, ESRD on HD, dementia, type 2 diabetes, hypertension, hyperlipidemia presenting to the hospital for evaluation of left arm weakness.  Patient reports having weakness in his left upper extremity.  Denies having any loss of sensation or tingling.  Per son at bedside, patient was last seen in his usual state of health yesterday evening at around 7 PM.  This morning when patient's son went to his house to take him for dialysis, he noted that the patient could not lift his left arm.  Patient did go for his full session of dialysis.  Son also noticed that patient had difficulty using his walker to ambulate as he could not use his left upper extremity to grasp the bar.  He did not notice any leg weakness or dragging of leg.  Patient denies any prior  history of stroke.  ED Course: Vitals stable on arrival.  I-STAT troponin negative and EKG not suggestive of ACS.  Potassium 3.3.  Head CT showing no acute finding.  Hospital Course:  1-left upper extremity hemiparesis: With pain and monoparesis -Stroke has been ruled out -Following neurology recommendation and assessment patient most likely with Khary arthritis -continue Risk factor modification for secondary prevention -Patient has been started on colchicine and will continue the use of allopurinol. -Continue aspirin  2-low-grade fever -Most likely in the setting of acute gouty arthritis -Blood cultures x2 has been taken and so far no growth appreciated -Respiratory panel checked and negative -No acute cardiopulmonary process appreciated on chest x-ray -Patient did not make any urine and he is chronically on hemodialysis No further fever appreciated. -No antibiotics will be given at this time.  3-end-stage renal disease on hemodialysis -Continue dialysis Monday, Wednesday on Friday -Dialysis has been provided prior to discharge -Resume outpatient schedule and follow-up with renal service.  4-essential hypertension -Stable -Resume home antihypertensive regimen -Heart healthy diet has been instructed.  5-type 2 diabetes mellitus with nephropathy -Overall controlled -Last hemoglobin A1c 5.9 on September 2019 -Continue modified carbohydrate diet and home hypoglycemic regimen.  6-hyperlipidemia -Continue statins  7-dementia without behavioral disturbances -Continue Aricept -Continue supportive care -Patient demonstrated to be oriented x3 during examination.  8-anemia of chronic kidney disease -Baseline hemoglobin around 9 -Following recommendations by renal service patient will be resolved on Epogen 3 times per week during hemodialysis.  Procedures:  See below for x-ray reports  Hemodialysis has been provided  Consultations:  Neurology  Renal  service  Discharge Exam: Vitals:   12/14/17 1345 12/14/17 1600  BP: 132/65 (!) 148/78  Pulse: 79 92  Resp: 16 20  Temp: 98.4 F (36.9 C) 98.9 F (37.2 C)  SpO2: 99% 100%    General: Afebrile, denies chest pain, no nausea, no vomiting.  There is no shortness of breath, no dysarthria or pronation drift.  Patient continues present some discomfort in his left shoulder with impair movement. Cardiovascular: P8-K9, positive systolic ejection murmur, no rubs, no gallops, no JVD. Respiratory: Good air movement bilaterally, no wheezing, no crackles Abdomen: Soft, nontender, nondistended, positive bowel sounds. Extremities: No cyanosis, no clubbing Neurologic exam: Oriented x3, moving 4 limbs without difficulty, no pronation drift.  No focal deficit.  Patient with some pain and decreased range of motion on his left upper extremity most likely associated with gouty arthritis.  Discharge Instructions   Discharge Instructions    Diet - low sodium heart healthy   Complete by:  As directed    Discharge instructions   Complete by:  As directed    Maintain adequate hydration Take medication as prescribed Arrange follow-up with PCP in 10 days Continue outpatient hemodialysis as previously scheduled.     Allergies as of 12/14/2017      Reactions   Penicillins Itching, Other (See Comments)   On arms only. Has patient had a PCN reaction causing immediate rash, facial/tongue/throat swelling, SOB or lightheadedness with hypotension: No Has patient had a PCN reaction causing severe rash involving mucus membranes or skin necrosis: No Has patient had a PCN reaction that required hospitalization No Has patient had a PCN reaction occurring within the last 10 years: No If all of the above answers are "NO", then may proceed with Cephalosporin use.      Medication List    TAKE these medications   allopurinol 100 MG tablet Commonly known as:  ZYLOPRIM Take 100 mg by mouth daily.   amLODipine 5 MG  tablet Commonly known as:  NORVASC Take 1 tablet (5 mg total) by mouth daily.   aspirin EC 81 MG tablet Take 1 tablet (81 mg total) by mouth every morning.   atorvastatin 40 MG tablet Commonly known as:  LIPITOR Take 1 tablet (40 mg total) by mouth daily at 6 PM.   calcitRIOL 0.25 MCG capsule Commonly known as:  ROCALTROL Take 1 capsule (0.25 mcg total) by mouth every Monday, Wednesday, and Friday with hemodialysis. Start taking on:  12/16/2017   colchicine 0.6 MG tablet Take 1 tablet (0.6 mg total) by mouth 2 (two) times daily.   donepezil 5 MG tablet Commonly known as:  ARICEPT Take 2 tablets (10 mg total) by mouth at bedtime.   epoetin alfa 10000 UNIT/ML injection Commonly known as:  EPOGEN,PROCRIT Inject 2 mLs (20,000 Units total) into the vein 3 (three) times a week. Start taking on:  12/16/2017   ferrous sulfate 325 (65 FE) MG tablet Take 325 mg by mouth daily with breakfast.   finasteride 5 MG tablet Commonly known as:  PROSCAR Take 5 mg by mouth daily.   montelukast 10 MG tablet Commonly known as:  SINGULAIR Take 10 mg by mouth at bedtime.   pantoprazole 40 MG tablet Commonly known as:  PROTONIX Take 1 tablet (40 mg total) by mouth daily at 6 (six) AM.      Allergies  Allergen Reactions  . Penicillins Itching and Other (See Comments)    On arms only. Has patient had a  PCN reaction causing immediate rash, facial/tongue/throat swelling, SOB or lightheadedness with hypotension: No Has patient had a PCN reaction causing severe rash involving mucus membranes or skin necrosis: No Has patient had a PCN reaction that required hospitalization No Has patient had a PCN reaction occurring within the last 10 years: No If all of the above answers are "NO", then may proceed with Cephalosporin use.    Follow-up Information    [place], Modena Nunnery, MD. Schedule an appointment as soon as possible for a visit in 10 day(s).   Specialty:  Family Medicine Contact  information: [address] [place] [address] [phone]           The results of significant diagnostics from this hospitalization (including imaging, microbiology, ancillary and laboratory) are listed below for reference.    Significant Diagnostic Studies: Dg Chest 1 View  Result Date: 12/13/2017 CLINICAL DATA:  Fever EXAM: CHEST  1 VIEW COMPARISON:  11/25/2017 FINDINGS: Right dialysis catheter in place with the tip at the cavoatrial junction, stable. Cardiomegaly with vascular congestion. Low lung volumes with bibasilar atelectasis. No visible significant effusions or acute bony abnormality. IMPRESSION: Low lung volumes. Mild cardiomegaly, vascular congestion and bibasilar atelectasis. Electronically Signed   By: Rolm Baptise [name]   On: 12/13/2017 09:09   Dg Chest 2 View  Result Date: 11/25/2017 CLINICAL DATA:  Weakness EXAM: CHEST - 2 VIEW COMPARISON:  Chest x-rays dated 10/18/2017 and 09/06/2017. FINDINGS: Heart size and mediastinal contours are stable. Lungs are clear. No pleural effusion or pneumothorax seen. RIGHT-sided central catheter is stable in position with tip at the level of the lower SVC. No acute or suspicious osseous finding. IMPRESSION: No active cardiopulmonary disease. Electronically Signed   By: Franki Cabot [name]   On: 11/25/2017 18:51   Ct Head Wo Contrast  Result Date: 12/12/2017 CLINICAL DATA:  Left arm weakness that started yesterday. History of dementia EXAM: CT HEAD WITHOUT CONTRAST TECHNIQUE: Contiguous axial images were obtained from the base of the skull through the vertex without intravenous contrast. COMPARISON:  09/06/2017 FINDINGS: Brain: No evidence of acute infarction, hemorrhage, hydrocephalus, extra-axial collection or mass lesion/mass effect. Atrophy with frontal predilection. Chronic small vessel ischemia with confluent low-density in the cerebral white matter. Vascular: No hyperdense vessel or unexpected calcification. Skull: Normal.  Negative for fracture or focal lesion. Sinuses/Orbits: No acute finding. IMPRESSION: 1. No acute finding. 2. Frontal predominant atrophy. Electronically Signed   By: Monte Fantasia [name]   On: 12/12/2017 18:48   Mr Brain Wo Contrast  Result Date: 12/13/2017 CLINICAL DATA:  Altered mental status for 2 days.  No known injury EXAM: MRI HEAD WITHOUT CONTRAST TECHNIQUE: Multiplanar, multiecho pulse sequences of the brain and surrounding structures were obtained without intravenous contrast. COMPARISON:  Head CT from yesterday FINDINGS: Brain: No acute infarction, hemorrhage, hydrocephalus, or mass lesion. Cerebral volume loss and moderate chronic small vessel ischemia in the cerebral white matter. Prominent bifrontal extra-axial CSF suspicious for hygromas, although cortical vessel displacement is not well demonstrated. This appearance is non progressed since at least April 2019. Vascular: Major flow voids are preserved Skull and upper cervical spine: Diffuse hypointense marrow space most likely from patient's end-stage renal disease. Sinuses/Orbits: Negative IMPRESSION: 1. No acute finding. 2. Atrophy and chronic small vessel ischemia. Electronically Signed   By: Monte Fantasia [name]   On: 12/13/2017 08:56   US Carotid Bilateral  Result Date: 12/13/2017 CLINICAL DATA:  84 year old male with stroke-like symptoms EXAM: BILATERAL CAROTID DUPLEX ULTRASOUND  TECHNIQUE: Pearline Cables scale imaging, color Doppler and duplex ultrasound were performed of bilateral carotid and vertebral arteries in the neck. COMPARISON:  Brain MRI 12/13/2017 FINDINGS: Criteria: Quantification of carotid stenosis is based on velocity parameters that correlate the residual internal carotid diameter with NASCET-based stenosis levels, using the diameter of the distal internal carotid lumen as the denominator for stenosis measurement. The following velocity measurements were obtained: RIGHT ICA: 50/16 cm/sec CCA: 16/10 cm/sec SYSTOLIC ICA/CCA RATIO:   0.7 ECA:  56 cm/sec LEFT ICA: 64/17 cm/sec CCA: 96/04 cm/sec SYSTOLIC ICA/CCA RATIO:  0.8 ECA:  56 cm/sec RIGHT CAROTID ARTERY: Trace smooth heterogeneous atherosclerotic plaque in the carotid bifurcation. No significant plaque or evidence of stenosis in the internal carotid artery. RIGHT VERTEBRAL ARTERY:  Patent with normal antegrade flow. LEFT CAROTID ARTERY: No significant atherosclerotic plaque or evidence of stenosis in the internal carotid artery. LEFT VERTEBRAL ARTERY:  Patent with normal antegrade flow. IMPRESSION: 1. No significant atherosclerotic plaque or evidence of stenosis in either internal carotid artery. 2. Minimal heterogeneous plaque at the carotid bifurcations bilaterally. 3. Vertebral arteries are patent with normal antegrade flow. Electronically Signed   By: Jacqulynn Cadet [name]   On: 12/13/2017 09:55   Vas US Duplex Dialysis Access (avf, Avg)  Result Date: 11/22/2017 DIALYSIS ACCESS Reason for Exam: Unable to dialyze through AVF/AVG. Access Site: Left Upper Extremity. Access Type: Upper arm brachial axillary 4-63mm AV graft. History: Failed basilic vein fistula. Performing Technologist: Burley Saver RVT  Examination Guidelines: A complete evaluation includes B-mode imaging, spectral Doppler, color Doppler, and power Doppler as needed of all accessible portions of each vessel. Unilateral testing is considered an integral part of a complete examination. Limited examinations for reoccurring indications may be performed as noted.  Findings:   +--------------------+----------+-----------------+-------------+ AVG                 PSV (cm/s)Flow Vol (mL/min)  Describe    +--------------------+----------+-----------------+-------------+ Native artery inflow   250          1903                     +--------------------+----------+-----------------+-------------+ Arterial anastomosis   591                                    +--------------------+----------+-----------------+-------------+ Prox graft             493                                   +--------------------+----------+-----------------+-------------+ Mid graft              160                     0.49 cm depth +--------------------+----------+-----------------+-------------+ Distal graft           158                                   +--------------------+----------+-----------------+-------------+ Venous anastomosis     149                                   +--------------------+----------+-----------------+-------------+ Venous outflow         179                                   +--------------------+----------+-----------------+-------------+  Summary: Patent arteriovenous graft.  Elevated velocities noted in the distal upper arm near the arterial anastomosis, possibly due to vessel diameter.  *See table(s) above for measurements and observations.  Diagnosing physician: Curt Jews MD Electronically signed by Curt Jews MD on 11/22/2017 at 2:54:09 PM.   --------------------------------------------------------------------------------   Final     Microbiology: Recent Results (from the past 240 hour(s))  Culture, blood (Routine X 2) w Reflex to ID Panel     Status: None (Preliminary result)   Collection Time: 12/13/17  8:09 AM  Result Value Ref Range Status   Specimen Description BLOOD RIGHT ANTECUBITAL  Final   Special Requests   Final    BOTTLES DRAWN AEROBIC AND ANAEROBIC Blood Culture adequate volume   Culture   Final    NO GROWTH 1 DAY Performed at [facility], [address] [place], [place] [address]    Report Status PENDING  Incomplete  Culture, blood (Routine X 2) w Reflex to ID Panel     Status: None (Preliminary result)   Collection Time: 12/13/17  8:14 AM  Result Value Ref Range Status   Specimen Description BLOOD RIGHT HAND  Final   Special Requests   Final    BOTTLES DRAWN AEROBIC AND ANAEROBIC Blood Culture  adequate volume   Culture   Final    NO GROWTH 1 DAY Performed at [facility], [address], [place], [place]  [address]    Report Status PENDING  Incomplete  Respiratory Panel by PCR     Status: None   Collection Time: 12/13/17 10:03 AM  Result Value Ref Range Status   Adenovirus NOT DETECTED NOT DETECTED Final   Coronavirus 229E NOT DETECTED NOT DETECTED Final   Coronavirus HKU1 NOT DETECTED NOT DETECTED Final   Coronavirus NL63 NOT DETECTED NOT DETECTED Final   Coronavirus OC43 NOT DETECTED NOT DETECTED Final   Metapneumovirus NOT DETECTED NOT DETECTED Final   Rhinovirus / Enterovirus NOT DETECTED NOT DETECTED Final   Influenza A NOT DETECTED NOT DETECTED Final   Influenza B NOT DETECTED NOT DETECTED Final   Parainfluenza Virus 1 NOT DETECTED NOT DETECTED Final   Parainfluenza Virus 2 NOT DETECTED NOT DETECTED Final   Parainfluenza Virus 3 NOT DETECTED NOT DETECTED Final   Parainfluenza Virus 4 NOT DETECTED NOT DETECTED Final   Respiratory Syncytial Virus NOT DETECTED NOT DETECTED Final   Bordetella pertussis NOT DETECTED NOT DETECTED Final   Chlamydophila pneumoniae NOT DETECTED NOT DETECTED Final   Mycoplasma pneumoniae NOT DETECTED NOT DETECTED Final    Comment: Performed at [facility], [place] [address] [place], [place] [address]     Labs: Basic Metabolic Panel: Recent Labs  Lab 12/12/17 1805 12/12/17 1925 12/12/17 2226 12/13/17 0021 12/13/17 0530 12/14/17 0956  NA 135 137  --   --  136 136  K 3.3* 2.8* 2.8* 3.0* 3.6 3.8  CL 94* 97*  --   --  101 102  CO2  --  28  --   --  26 23  GLUCOSE 181* 183*  --   --  124* 215*  BUN 14 15  --   --  22 39*  CREATININE 2.80* 2.86*  --   --  3.76* 5.77*  CALCIUM  --  8.5*  --   --  8.9 9.2  MG  --  1.9  --   --   --   --   PHOS  --   --   --   --  2.8 3.3   Liver Function Tests: Recent Labs  Lab 12/12/17 1925 12/13/17 0530 12/14/17 0956  AST 20  --   --   ALT 19  --   --   ALKPHOS 99  --   --   BILITOT 0.6   --   --   PROT 6.6  --   --   ALBUMIN 3.1* 2.8* 2.8*   CBC: Recent Labs  Lab 12/12/17 1800 12/12/17 1805 12/14/17 0956  WBC 9.7  --  7.3  NEUTROABS 7.5  --   --   HGB 8.7* 8.8* 8.1*  HCT 27.4* 26.0* 26.3*  MCV 88.7  --  90.4  PLT 258  --  236   CBG: Recent Labs  Lab 12/13/17 1608 12/13/17 2130 12/14/17 0752 12/14/17 1112 12/14/17 1607  GLUCAP 118* 119* 92 148* 139*    Signed:  Barton Dubois MD.  [facility] 12/14/2017, 4:52 PM

## 2017-12-15 LAB — HEPATITIS B SURFACE ANTIGEN: HEP B S AG: NEGATIVE

## 2017-12-18 LAB — CULTURE, BLOOD (ROUTINE X 2)
Culture: NO GROWTH
Culture: NO GROWTH
Special Requests: ADEQUATE
Special Requests: ADEQUATE

## 2017-12-20 ENCOUNTER — Telehealth: Payer: Self-pay | Admitting: *Deleted

## 2017-12-20 NOTE — Telephone Encounter (Signed)
noted 

## 2017-12-20 NOTE — Telephone Encounter (Signed)
Received call from [place], [facility] PT with [facility].   Reports that [facility] PT will be held until 01/10/2017 as patient is going into SNF for Respite care.

## 2018-01-03 DIAGNOSIS — N186 End stage renal disease: Secondary | ICD-10-CM | POA: Diagnosis not present

## 2018-01-03 DIAGNOSIS — I129 Hypertensive chronic kidney disease with stage 1 through stage 4 chronic kidney disease, or unspecified chronic kidney disease: Secondary | ICD-10-CM | POA: Diagnosis not present

## 2018-01-03 DIAGNOSIS — Z992 Dependence on renal dialysis: Secondary | ICD-10-CM | POA: Diagnosis not present

## 2018-01-06 DIAGNOSIS — D689 Coagulation defect, unspecified: Secondary | ICD-10-CM | POA: Diagnosis not present

## 2018-01-06 DIAGNOSIS — E039 Hypothyroidism, unspecified: Secondary | ICD-10-CM | POA: Diagnosis not present

## 2018-01-06 DIAGNOSIS — E1129 Type 2 diabetes mellitus with other diabetic kidney complication: Secondary | ICD-10-CM | POA: Diagnosis not present

## 2018-01-06 DIAGNOSIS — D509 Iron deficiency anemia, unspecified: Secondary | ICD-10-CM | POA: Diagnosis not present

## 2018-01-06 DIAGNOSIS — N2581 Secondary hyperparathyroidism of renal origin: Secondary | ICD-10-CM | POA: Diagnosis not present

## 2018-01-06 DIAGNOSIS — N186 End stage renal disease: Secondary | ICD-10-CM | POA: Diagnosis not present

## 2018-01-06 DIAGNOSIS — D631 Anemia in chronic kidney disease: Secondary | ICD-10-CM | POA: Diagnosis not present

## 2018-01-11 ENCOUNTER — Encounter: Payer: Self-pay | Admitting: Family Medicine

## 2018-01-11 ENCOUNTER — Other Ambulatory Visit: Payer: Self-pay

## 2018-01-11 ENCOUNTER — Ambulatory Visit (INDEPENDENT_AMBULATORY_CARE_PROVIDER_SITE_OTHER): Payer: Medicare HMO | Admitting: Family Medicine

## 2018-01-11 ENCOUNTER — Ambulatory Visit: Payer: Medicare HMO | Admitting: Family Medicine

## 2018-01-11 VITALS — BP 132/70 | HR 86 | Temp 98.1°F | Resp 16 | Ht 63.78 in | Wt 156.0 lb

## 2018-01-11 DIAGNOSIS — F0390 Unspecified dementia without behavioral disturbance: Secondary | ICD-10-CM

## 2018-01-11 DIAGNOSIS — D638 Anemia in other chronic diseases classified elsewhere: Secondary | ICD-10-CM | POA: Diagnosis not present

## 2018-01-11 DIAGNOSIS — N186 End stage renal disease: Secondary | ICD-10-CM

## 2018-01-11 DIAGNOSIS — M170 Bilateral primary osteoarthritis of knee: Secondary | ICD-10-CM

## 2018-01-11 DIAGNOSIS — R69 Illness, unspecified: Secondary | ICD-10-CM | POA: Diagnosis not present

## 2018-01-11 DIAGNOSIS — I10 Essential (primary) hypertension: Secondary | ICD-10-CM

## 2018-01-11 DIAGNOSIS — E1122 Type 2 diabetes mellitus with diabetic chronic kidney disease: Secondary | ICD-10-CM | POA: Diagnosis not present

## 2018-01-11 DIAGNOSIS — N400 Enlarged prostate without lower urinary tract symptoms: Secondary | ICD-10-CM | POA: Diagnosis not present

## 2018-01-11 DIAGNOSIS — N185 Chronic kidney disease, stage 5: Secondary | ICD-10-CM

## 2018-01-11 DIAGNOSIS — R2681 Unsteadiness on feet: Secondary | ICD-10-CM | POA: Diagnosis not present

## 2018-01-11 DIAGNOSIS — R29898 Other symptoms and signs involving the musculoskeletal system: Secondary | ICD-10-CM

## 2018-01-11 DIAGNOSIS — M1A3120 Chronic gout due to renal impairment, left shoulder, without tophus (tophi): Secondary | ICD-10-CM

## 2018-01-11 NOTE — Progress Notes (Signed)
Subjective:    Patient ID: Miguel Tucker, male    DOB: August 29, 1930, 85 y.o.   MRN: [id number]  Patient presents for New Patient (has been discharged from [facility] in Nov 2019- is not fasting- currently on dialysisi- has PICC to R side of chest and dialysis acess to L upper arm- Picc to be removed on 01/12/2017.)  Patient here for follow-up.  He did establish care back in September while I was on maternity leave.  He had been discharged from the hospital at that time to the [facility].  Since then he has been readmitted to the hospital for concern for stroke however that was ruled out he had left upper extremity pain and weakness this was thought to be possible gout.  He was started on allopurinol as well as colchicine 0.6 twice a day which he is still taking.  He still has significant weakness in his arm he was supposed to start occupational therapy however due to the holidays he was in respite care with his wife this has not been started yet.  Here with son who provides all information as well as chart review  Chronic kidney disease he is currently on hemodialysis this was started in November he goes Monday Wednesday Friday at [facility] His nephrologist Dr. Moshe Cipro with [facility].  He also has anemia of renal disease and he gets Epogen.  Diabetes mellitus his diabetes has improved significantly in the setting of his failing renal function his A1c was 5.2%  [place] he is currently followed by neurology he is on Aricept  History of asthma he does not use his inhaler but he is on Singulair he has not had any exacerbations.  Chronic knee pain- bilat, has known OA, had steroid shots in the past, LEFT knee bothering him more   Previous PCP [name]  He states he feels fine   Review Of Systems: Per SON and above  GEN- denies fatigue, fever, weight loss,weakness, recent illness HEENT- denies eye drainage, change in vision, nasal discharge, CVS- denies chest pain,  palpitations RESP- denies SOB, cough, wheeze ABD- denies N/V, change in stools, abd pain GU- denies dysuria, hematuria, dribbling, incontinence MSK- + joint pain, muscle aches, injury Neuro- denies headache, dizziness, syncope, seizure activity       Objective:    BP 132/70   Pulse 86   Temp 98.1 F (36.7 C) (Oral)   Resp 16   Ht 5' 3.78" (1.62 m)   Wt 156 lb (70.8 kg)   SpO2 99%   BMI 26.96 kg/m  GEN- NAD, alert and oriented x3, walkswith walker HEENT- PERRL, EOMI, non injected sclera, pink conjunctiva, MMM, oropharynx clear Neck- Supple, no thyromegaly, no JVD CVS- RRR, no murmur RESP-CTAB ABD-NABS,soft,NT,ND MSK- decreaesd ROM bilat knees, no effusion of knee, mild crepitus,  UE, decreased strength LUE compared to Right, decreased grasp/ unable to make tight fist EXT- No edema Pulses- Radial 2+        Assessment & Plan:      Problem List Items Addressed This Visit      Unprioritized   Anemia, chronic disease (Chronic)    Epogen per nephrology Reviewed last set of labs      BPH (benign prostatic hyperplasia)   Dementia (HCC) (Chronic)    Followed by neurology      Diabetes mellitus ([place]) (Chronic)    No current meds in setting of ESRD      ESRD (end stage renal disease) ([place])  Gait instability    Walking with walker Plan for PT for this and knee Of note , Son is getting him and mother moved into permanent facility      Gout    Will reduce colchicine to 1 tablet daily, plan to D/C this Continue allopurinol      HTN (hypertension) - Primary (Chronic)    Controlled on dialysis, BP meds per nephrology      Osteoarthritis of knee   Weakness of left upper extremity    OT to start next week         Note: This dictation was prepared with Dragon dictation along with smaller phrase technology. Any transcriptional errors that result from this process are unintentional.

## 2018-01-11 NOTE — Patient Instructions (Signed)
F/U 4 months  

## 2018-01-12 ENCOUNTER — Encounter: Payer: Self-pay | Admitting: Family Medicine

## 2018-01-12 DIAGNOSIS — Z452 Encounter for adjustment and management of vascular access device: Secondary | ICD-10-CM | POA: Diagnosis not present

## 2018-01-12 DIAGNOSIS — R2681 Unsteadiness on feet: Secondary | ICD-10-CM | POA: Insufficient documentation

## 2018-01-12 MED ORDER — COLCHICINE 0.6 MG PO TABS: 0.6000 mg | ORAL_TABLET | Freq: Every day | ORAL | 0 refills | Status: DC

## 2018-01-12 NOTE — Assessment & Plan Note (Signed)
Walking with walker Plan for PT for this and knee Of note , Son is getting him and mother moved into permanent facility

## 2018-01-12 NOTE — Assessment & Plan Note (Signed)
No current meds in setting of ESRD

## 2018-01-12 NOTE — Assessment & Plan Note (Signed)
Epogen per nephrology Reviewed last set of labs

## 2018-01-12 NOTE — Assessment & Plan Note (Signed)
Followed by neurology.   

## 2018-01-12 NOTE — Assessment & Plan Note (Signed)
OT to start next week

## 2018-01-12 NOTE — Assessment & Plan Note (Signed)
Will reduce colchicine to 1 tablet daily, plan to D/C this Continue allopurinol

## 2018-01-12 NOTE — Assessment & Plan Note (Signed)
Controlled on dialysis, BP meds per nephrology

## 2018-01-13 ENCOUNTER — Telehealth: Payer: Self-pay | Admitting: Family Medicine

## 2018-01-13 NOTE — Telephone Encounter (Signed)
-----   Message from Gracey, LPN sent at 06/07/4032  4:17 PM EST ----- Regarding: RE: Therapy Call placed to [facility]. Was informed that patient declined services due to out of pocket cost.   Call placed to pharmacy. Will send current medications for compliance packages.  ----- Message ----- From: [name], MD Sent: 01/12/2018   9:19 AM EST To: Eden Lathe Six, LPN Subject: Therapy                                         Can you call [facility] I want to add PT for Mr. Kopf for his gait and left OA knee He is already set up for OT for his Left upper ext weakness  I also need full med list from his pharmacy, I want to decrease the colchicine to once a day

## 2018-01-19 ENCOUNTER — Telehealth: Payer: Self-pay | Admitting: *Deleted

## 2018-01-19 NOTE — Telephone Encounter (Signed)
Received call from patient son, Miguel Tucker.  Reports that he is attempting to find long term placement for patient and spouse. Requested FL2 be faxed to [facility] Attn: Irven Shelling [phone]~ telephone/ [phone]~ fax and [facility] AttnFrederik Pear [phone]~ telephone/ [phone]~ fax.   FL2 completed and placed on desk for signature.

## 2018-01-20 NOTE — Telephone Encounter (Signed)
signed

## 2018-01-30 DIAGNOSIS — N186 End stage renal disease: Secondary | ICD-10-CM | POA: Diagnosis not present

## 2018-01-30 DIAGNOSIS — N2581 Secondary hyperparathyroidism of renal origin: Secondary | ICD-10-CM | POA: Diagnosis not present

## 2018-01-30 DIAGNOSIS — D509 Iron deficiency anemia, unspecified: Secondary | ICD-10-CM | POA: Diagnosis not present

## 2018-01-30 DIAGNOSIS — E039 Hypothyroidism, unspecified: Secondary | ICD-10-CM | POA: Diagnosis not present

## 2018-01-30 DIAGNOSIS — D689 Coagulation defect, unspecified: Secondary | ICD-10-CM | POA: Diagnosis not present

## 2018-01-30 DIAGNOSIS — E1129 Type 2 diabetes mellitus with other diabetic kidney complication: Secondary | ICD-10-CM | POA: Diagnosis not present

## 2018-01-30 DIAGNOSIS — D631 Anemia in chronic kidney disease: Secondary | ICD-10-CM | POA: Diagnosis not present

## 2018-02-01 ENCOUNTER — Other Ambulatory Visit (HOSPITAL_COMMUNITY): Payer: Self-pay | Admitting: Family Medicine

## 2018-02-01 DIAGNOSIS — N186 End stage renal disease: Secondary | ICD-10-CM | POA: Diagnosis not present

## 2018-02-01 DIAGNOSIS — Z992 Dependence on renal dialysis: Secondary | ICD-10-CM | POA: Diagnosis not present

## 2018-02-03 DIAGNOSIS — N186 End stage renal disease: Secondary | ICD-10-CM | POA: Diagnosis not present

## 2018-02-03 DIAGNOSIS — I129 Hypertensive chronic kidney disease with stage 1 through stage 4 chronic kidney disease, or unspecified chronic kidney disease: Secondary | ICD-10-CM | POA: Diagnosis not present

## 2018-02-03 DIAGNOSIS — Z992 Dependence on renal dialysis: Secondary | ICD-10-CM | POA: Diagnosis not present

## 2018-02-06 DIAGNOSIS — Z23 Encounter for immunization: Secondary | ICD-10-CM | POA: Diagnosis not present

## 2018-02-06 DIAGNOSIS — N2581 Secondary hyperparathyroidism of renal origin: Secondary | ICD-10-CM | POA: Diagnosis not present

## 2018-02-06 DIAGNOSIS — E1129 Type 2 diabetes mellitus with other diabetic kidney complication: Secondary | ICD-10-CM | POA: Diagnosis not present

## 2018-02-06 DIAGNOSIS — N186 End stage renal disease: Secondary | ICD-10-CM | POA: Diagnosis not present

## 2018-02-06 DIAGNOSIS — D689 Coagulation defect, unspecified: Secondary | ICD-10-CM | POA: Diagnosis not present

## 2018-02-06 DIAGNOSIS — D509 Iron deficiency anemia, unspecified: Secondary | ICD-10-CM | POA: Diagnosis not present

## 2018-02-08 DIAGNOSIS — Z23 Encounter for immunization: Secondary | ICD-10-CM | POA: Diagnosis not present

## 2018-02-08 DIAGNOSIS — N186 End stage renal disease: Secondary | ICD-10-CM | POA: Diagnosis not present

## 2018-02-08 DIAGNOSIS — N2581 Secondary hyperparathyroidism of renal origin: Secondary | ICD-10-CM | POA: Diagnosis not present

## 2018-02-08 DIAGNOSIS — D509 Iron deficiency anemia, unspecified: Secondary | ICD-10-CM | POA: Diagnosis not present

## 2018-02-08 DIAGNOSIS — E1129 Type 2 diabetes mellitus with other diabetic kidney complication: Secondary | ICD-10-CM | POA: Diagnosis not present

## 2018-02-08 DIAGNOSIS — D689 Coagulation defect, unspecified: Secondary | ICD-10-CM | POA: Diagnosis not present

## 2018-02-10 DIAGNOSIS — D689 Coagulation defect, unspecified: Secondary | ICD-10-CM | POA: Diagnosis not present

## 2018-02-10 DIAGNOSIS — N186 End stage renal disease: Secondary | ICD-10-CM | POA: Diagnosis not present

## 2018-02-10 DIAGNOSIS — D509 Iron deficiency anemia, unspecified: Secondary | ICD-10-CM | POA: Diagnosis not present

## 2018-02-10 DIAGNOSIS — Z23 Encounter for immunization: Secondary | ICD-10-CM | POA: Diagnosis not present

## 2018-02-10 DIAGNOSIS — N2581 Secondary hyperparathyroidism of renal origin: Secondary | ICD-10-CM | POA: Diagnosis not present

## 2018-02-10 DIAGNOSIS — E1129 Type 2 diabetes mellitus with other diabetic kidney complication: Secondary | ICD-10-CM | POA: Diagnosis not present

## 2018-02-13 DIAGNOSIS — Z23 Encounter for immunization: Secondary | ICD-10-CM | POA: Diagnosis not present

## 2018-02-13 DIAGNOSIS — E1129 Type 2 diabetes mellitus with other diabetic kidney complication: Secondary | ICD-10-CM | POA: Diagnosis not present

## 2018-02-13 DIAGNOSIS — D509 Iron deficiency anemia, unspecified: Secondary | ICD-10-CM | POA: Diagnosis not present

## 2018-02-13 DIAGNOSIS — N186 End stage renal disease: Secondary | ICD-10-CM | POA: Diagnosis not present

## 2018-02-13 DIAGNOSIS — N2581 Secondary hyperparathyroidism of renal origin: Secondary | ICD-10-CM | POA: Diagnosis not present

## 2018-02-13 DIAGNOSIS — D689 Coagulation defect, unspecified: Secondary | ICD-10-CM | POA: Diagnosis not present

## 2018-02-15 DIAGNOSIS — Z23 Encounter for immunization: Secondary | ICD-10-CM | POA: Diagnosis not present

## 2018-02-15 DIAGNOSIS — D509 Iron deficiency anemia, unspecified: Secondary | ICD-10-CM | POA: Diagnosis not present

## 2018-02-15 DIAGNOSIS — N186 End stage renal disease: Secondary | ICD-10-CM | POA: Diagnosis not present

## 2018-02-15 DIAGNOSIS — N2581 Secondary hyperparathyroidism of renal origin: Secondary | ICD-10-CM | POA: Diagnosis not present

## 2018-02-15 DIAGNOSIS — E1129 Type 2 diabetes mellitus with other diabetic kidney complication: Secondary | ICD-10-CM | POA: Diagnosis not present

## 2018-02-15 DIAGNOSIS — D689 Coagulation defect, unspecified: Secondary | ICD-10-CM | POA: Diagnosis not present

## 2018-02-17 DIAGNOSIS — N2581 Secondary hyperparathyroidism of renal origin: Secondary | ICD-10-CM | POA: Diagnosis not present

## 2018-02-17 DIAGNOSIS — E1129 Type 2 diabetes mellitus with other diabetic kidney complication: Secondary | ICD-10-CM | POA: Diagnosis not present

## 2018-02-17 DIAGNOSIS — D689 Coagulation defect, unspecified: Secondary | ICD-10-CM | POA: Diagnosis not present

## 2018-02-17 DIAGNOSIS — N186 End stage renal disease: Secondary | ICD-10-CM | POA: Diagnosis not present

## 2018-02-17 DIAGNOSIS — Z23 Encounter for immunization: Secondary | ICD-10-CM | POA: Diagnosis not present

## 2018-02-17 DIAGNOSIS — D509 Iron deficiency anemia, unspecified: Secondary | ICD-10-CM | POA: Diagnosis not present

## 2018-02-20 DIAGNOSIS — N2581 Secondary hyperparathyroidism of renal origin: Secondary | ICD-10-CM | POA: Diagnosis not present

## 2018-02-20 DIAGNOSIS — E1129 Type 2 diabetes mellitus with other diabetic kidney complication: Secondary | ICD-10-CM | POA: Diagnosis not present

## 2018-02-20 DIAGNOSIS — D689 Coagulation defect, unspecified: Secondary | ICD-10-CM | POA: Diagnosis not present

## 2018-02-20 DIAGNOSIS — Z23 Encounter for immunization: Secondary | ICD-10-CM | POA: Diagnosis not present

## 2018-02-20 DIAGNOSIS — N186 End stage renal disease: Secondary | ICD-10-CM | POA: Diagnosis not present

## 2018-02-20 DIAGNOSIS — D509 Iron deficiency anemia, unspecified: Secondary | ICD-10-CM | POA: Diagnosis not present

## 2018-02-21 ENCOUNTER — Other Ambulatory Visit: Payer: Self-pay | Admitting: Family Medicine

## 2018-02-21 ENCOUNTER — Other Ambulatory Visit (HOSPITAL_COMMUNITY): Payer: Self-pay | Admitting: Family Medicine

## 2018-02-21 DIAGNOSIS — D649 Anemia, unspecified: Secondary | ICD-10-CM | POA: Diagnosis not present

## 2018-02-21 DIAGNOSIS — I10 Essential (primary) hypertension: Secondary | ICD-10-CM | POA: Diagnosis not present

## 2018-02-21 DIAGNOSIS — N400 Enlarged prostate without lower urinary tract symptoms: Secondary | ICD-10-CM | POA: Diagnosis not present

## 2018-02-21 DIAGNOSIS — E0865 Diabetes mellitus due to underlying condition with hyperglycemia: Secondary | ICD-10-CM | POA: Diagnosis not present

## 2018-02-22 DIAGNOSIS — D689 Coagulation defect, unspecified: Secondary | ICD-10-CM | POA: Diagnosis not present

## 2018-02-22 DIAGNOSIS — R69 Illness, unspecified: Secondary | ICD-10-CM | POA: Diagnosis not present

## 2018-02-22 DIAGNOSIS — R278 Other lack of coordination: Secondary | ICD-10-CM | POA: Diagnosis not present

## 2018-02-22 DIAGNOSIS — N186 End stage renal disease: Secondary | ICD-10-CM | POA: Diagnosis not present

## 2018-02-22 DIAGNOSIS — E1129 Type 2 diabetes mellitus with other diabetic kidney complication: Secondary | ICD-10-CM | POA: Diagnosis not present

## 2018-02-22 DIAGNOSIS — N2581 Secondary hyperparathyroidism of renal origin: Secondary | ICD-10-CM | POA: Diagnosis not present

## 2018-02-22 DIAGNOSIS — D509 Iron deficiency anemia, unspecified: Secondary | ICD-10-CM | POA: Diagnosis not present

## 2018-02-22 DIAGNOSIS — Z23 Encounter for immunization: Secondary | ICD-10-CM | POA: Diagnosis not present

## 2018-02-24 DIAGNOSIS — D689 Coagulation defect, unspecified: Secondary | ICD-10-CM | POA: Diagnosis not present

## 2018-02-24 DIAGNOSIS — Z23 Encounter for immunization: Secondary | ICD-10-CM | POA: Diagnosis not present

## 2018-02-24 DIAGNOSIS — D509 Iron deficiency anemia, unspecified: Secondary | ICD-10-CM | POA: Diagnosis not present

## 2018-02-24 DIAGNOSIS — E1129 Type 2 diabetes mellitus with other diabetic kidney complication: Secondary | ICD-10-CM | POA: Diagnosis not present

## 2018-02-24 DIAGNOSIS — N186 End stage renal disease: Secondary | ICD-10-CM | POA: Diagnosis not present

## 2018-02-24 DIAGNOSIS — N2581 Secondary hyperparathyroidism of renal origin: Secondary | ICD-10-CM | POA: Diagnosis not present

## 2018-02-27 DIAGNOSIS — E1129 Type 2 diabetes mellitus with other diabetic kidney complication: Secondary | ICD-10-CM | POA: Diagnosis not present

## 2018-02-27 DIAGNOSIS — D689 Coagulation defect, unspecified: Secondary | ICD-10-CM | POA: Diagnosis not present

## 2018-02-27 DIAGNOSIS — N2581 Secondary hyperparathyroidism of renal origin: Secondary | ICD-10-CM | POA: Diagnosis not present

## 2018-02-27 DIAGNOSIS — D509 Iron deficiency anemia, unspecified: Secondary | ICD-10-CM | POA: Diagnosis not present

## 2018-02-27 DIAGNOSIS — N186 End stage renal disease: Secondary | ICD-10-CM | POA: Diagnosis not present

## 2018-02-27 DIAGNOSIS — Z23 Encounter for immunization: Secondary | ICD-10-CM | POA: Diagnosis not present

## 2018-03-01 DIAGNOSIS — D689 Coagulation defect, unspecified: Secondary | ICD-10-CM | POA: Diagnosis not present

## 2018-03-01 DIAGNOSIS — N2581 Secondary hyperparathyroidism of renal origin: Secondary | ICD-10-CM | POA: Diagnosis not present

## 2018-03-01 DIAGNOSIS — E1129 Type 2 diabetes mellitus with other diabetic kidney complication: Secondary | ICD-10-CM | POA: Diagnosis not present

## 2018-03-01 DIAGNOSIS — N186 End stage renal disease: Secondary | ICD-10-CM | POA: Diagnosis not present

## 2018-03-01 DIAGNOSIS — D509 Iron deficiency anemia, unspecified: Secondary | ICD-10-CM | POA: Diagnosis not present

## 2018-03-01 DIAGNOSIS — Z23 Encounter for immunization: Secondary | ICD-10-CM | POA: Diagnosis not present

## 2018-03-03 DIAGNOSIS — D518 Other vitamin B12 deficiency anemias: Secondary | ICD-10-CM | POA: Diagnosis not present

## 2018-03-03 DIAGNOSIS — D509 Iron deficiency anemia, unspecified: Secondary | ICD-10-CM | POA: Diagnosis not present

## 2018-03-03 DIAGNOSIS — E119 Type 2 diabetes mellitus without complications: Secondary | ICD-10-CM | POA: Diagnosis not present

## 2018-03-03 DIAGNOSIS — E7800 Pure hypercholesterolemia, unspecified: Secondary | ICD-10-CM | POA: Diagnosis not present

## 2018-03-03 DIAGNOSIS — N186 End stage renal disease: Secondary | ICD-10-CM | POA: Diagnosis not present

## 2018-03-03 DIAGNOSIS — Z23 Encounter for immunization: Secondary | ICD-10-CM | POA: Diagnosis not present

## 2018-03-03 DIAGNOSIS — D649 Anemia, unspecified: Secondary | ICD-10-CM | POA: Diagnosis not present

## 2018-03-03 DIAGNOSIS — E1129 Type 2 diabetes mellitus with other diabetic kidney complication: Secondary | ICD-10-CM | POA: Diagnosis not present

## 2018-03-03 DIAGNOSIS — E559 Vitamin D deficiency, unspecified: Secondary | ICD-10-CM | POA: Diagnosis not present

## 2018-03-03 DIAGNOSIS — D689 Coagulation defect, unspecified: Secondary | ICD-10-CM | POA: Diagnosis not present

## 2018-03-03 DIAGNOSIS — Z79899 Other long term (current) drug therapy: Secondary | ICD-10-CM | POA: Diagnosis not present

## 2018-03-03 DIAGNOSIS — E039 Hypothyroidism, unspecified: Secondary | ICD-10-CM | POA: Diagnosis not present

## 2018-03-03 DIAGNOSIS — N2581 Secondary hyperparathyroidism of renal origin: Secondary | ICD-10-CM | POA: Diagnosis not present

## 2018-03-03 DIAGNOSIS — E782 Mixed hyperlipidemia: Secondary | ICD-10-CM | POA: Diagnosis not present

## 2018-03-04 DIAGNOSIS — N186 End stage renal disease: Secondary | ICD-10-CM | POA: Diagnosis not present

## 2018-03-04 DIAGNOSIS — Z992 Dependence on renal dialysis: Secondary | ICD-10-CM | POA: Diagnosis not present

## 2018-03-04 DIAGNOSIS — I129 Hypertensive chronic kidney disease with stage 1 through stage 4 chronic kidney disease, or unspecified chronic kidney disease: Secondary | ICD-10-CM | POA: Diagnosis not present

## 2018-03-06 DIAGNOSIS — E1129 Type 2 diabetes mellitus with other diabetic kidney complication: Secondary | ICD-10-CM | POA: Diagnosis not present

## 2018-03-06 DIAGNOSIS — N186 End stage renal disease: Secondary | ICD-10-CM | POA: Diagnosis not present

## 2018-03-06 DIAGNOSIS — N2581 Secondary hyperparathyroidism of renal origin: Secondary | ICD-10-CM | POA: Diagnosis not present

## 2018-03-06 DIAGNOSIS — D689 Coagulation defect, unspecified: Secondary | ICD-10-CM | POA: Diagnosis not present

## 2018-03-07 DIAGNOSIS — R278 Other lack of coordination: Secondary | ICD-10-CM | POA: Diagnosis not present

## 2018-03-07 DIAGNOSIS — R69 Illness, unspecified: Secondary | ICD-10-CM | POA: Diagnosis not present

## 2018-03-08 DIAGNOSIS — N186 End stage renal disease: Secondary | ICD-10-CM | POA: Diagnosis not present

## 2018-03-08 DIAGNOSIS — N2581 Secondary hyperparathyroidism of renal origin: Secondary | ICD-10-CM | POA: Diagnosis not present

## 2018-03-08 DIAGNOSIS — E1129 Type 2 diabetes mellitus with other diabetic kidney complication: Secondary | ICD-10-CM | POA: Diagnosis not present

## 2018-03-08 DIAGNOSIS — D689 Coagulation defect, unspecified: Secondary | ICD-10-CM | POA: Diagnosis not present

## 2018-03-09 DIAGNOSIS — R69 Illness, unspecified: Secondary | ICD-10-CM | POA: Diagnosis not present

## 2018-03-09 DIAGNOSIS — R278 Other lack of coordination: Secondary | ICD-10-CM | POA: Diagnosis not present

## 2018-03-10 DIAGNOSIS — E1129 Type 2 diabetes mellitus with other diabetic kidney complication: Secondary | ICD-10-CM | POA: Diagnosis not present

## 2018-03-10 DIAGNOSIS — N2581 Secondary hyperparathyroidism of renal origin: Secondary | ICD-10-CM | POA: Diagnosis not present

## 2018-03-10 DIAGNOSIS — N186 End stage renal disease: Secondary | ICD-10-CM | POA: Diagnosis not present

## 2018-03-10 DIAGNOSIS — D689 Coagulation defect, unspecified: Secondary | ICD-10-CM | POA: Diagnosis not present

## 2018-03-11 DIAGNOSIS — R278 Other lack of coordination: Secondary | ICD-10-CM | POA: Diagnosis not present

## 2018-03-11 DIAGNOSIS — R69 Illness, unspecified: Secondary | ICD-10-CM | POA: Diagnosis not present

## 2018-03-13 DIAGNOSIS — N2581 Secondary hyperparathyroidism of renal origin: Secondary | ICD-10-CM | POA: Diagnosis not present

## 2018-03-13 DIAGNOSIS — N186 End stage renal disease: Secondary | ICD-10-CM | POA: Diagnosis not present

## 2018-03-13 DIAGNOSIS — E1129 Type 2 diabetes mellitus with other diabetic kidney complication: Secondary | ICD-10-CM | POA: Diagnosis not present

## 2018-03-13 DIAGNOSIS — D689 Coagulation defect, unspecified: Secondary | ICD-10-CM | POA: Diagnosis not present

## 2018-03-14 DIAGNOSIS — G309 Alzheimer's disease, unspecified: Secondary | ICD-10-CM | POA: Diagnosis not present

## 2018-03-14 DIAGNOSIS — R278 Other lack of coordination: Secondary | ICD-10-CM | POA: Diagnosis not present

## 2018-03-14 DIAGNOSIS — R69 Illness, unspecified: Secondary | ICD-10-CM | POA: Diagnosis not present

## 2018-03-14 DIAGNOSIS — D631 Anemia in chronic kidney disease: Secondary | ICD-10-CM | POA: Diagnosis not present

## 2018-03-14 DIAGNOSIS — I120 Hypertensive chronic kidney disease with stage 5 chronic kidney disease or end stage renal disease: Secondary | ICD-10-CM | POA: Diagnosis not present

## 2018-03-15 DIAGNOSIS — D689 Coagulation defect, unspecified: Secondary | ICD-10-CM | POA: Diagnosis not present

## 2018-03-15 DIAGNOSIS — R69 Illness, unspecified: Secondary | ICD-10-CM | POA: Diagnosis not present

## 2018-03-15 DIAGNOSIS — N186 End stage renal disease: Secondary | ICD-10-CM | POA: Diagnosis not present

## 2018-03-15 DIAGNOSIS — E1129 Type 2 diabetes mellitus with other diabetic kidney complication: Secondary | ICD-10-CM | POA: Diagnosis not present

## 2018-03-15 DIAGNOSIS — R278 Other lack of coordination: Secondary | ICD-10-CM | POA: Diagnosis not present

## 2018-03-15 DIAGNOSIS — N2581 Secondary hyperparathyroidism of renal origin: Secondary | ICD-10-CM | POA: Diagnosis not present

## 2018-03-16 DIAGNOSIS — R278 Other lack of coordination: Secondary | ICD-10-CM | POA: Diagnosis not present

## 2018-03-16 DIAGNOSIS — R69 Illness, unspecified: Secondary | ICD-10-CM | POA: Diagnosis not present

## 2018-03-17 DIAGNOSIS — D689 Coagulation defect, unspecified: Secondary | ICD-10-CM | POA: Diagnosis not present

## 2018-03-17 DIAGNOSIS — N186 End stage renal disease: Secondary | ICD-10-CM | POA: Diagnosis not present

## 2018-03-17 DIAGNOSIS — N2581 Secondary hyperparathyroidism of renal origin: Secondary | ICD-10-CM | POA: Diagnosis not present

## 2018-03-17 DIAGNOSIS — E1129 Type 2 diabetes mellitus with other diabetic kidney complication: Secondary | ICD-10-CM | POA: Diagnosis not present

## 2018-03-18 DIAGNOSIS — R69 Illness, unspecified: Secondary | ICD-10-CM | POA: Diagnosis not present

## 2018-03-18 DIAGNOSIS — R278 Other lack of coordination: Secondary | ICD-10-CM | POA: Diagnosis not present

## 2018-03-20 DIAGNOSIS — N2581 Secondary hyperparathyroidism of renal origin: Secondary | ICD-10-CM | POA: Diagnosis not present

## 2018-03-20 DIAGNOSIS — N186 End stage renal disease: Secondary | ICD-10-CM | POA: Diagnosis not present

## 2018-03-20 DIAGNOSIS — D689 Coagulation defect, unspecified: Secondary | ICD-10-CM | POA: Diagnosis not present

## 2018-03-20 DIAGNOSIS — E1129 Type 2 diabetes mellitus with other diabetic kidney complication: Secondary | ICD-10-CM | POA: Diagnosis not present

## 2018-03-21 DIAGNOSIS — R278 Other lack of coordination: Secondary | ICD-10-CM | POA: Diagnosis not present

## 2018-03-21 DIAGNOSIS — M10312 Gout due to renal impairment, left shoulder: Secondary | ICD-10-CM | POA: Diagnosis not present

## 2018-03-21 DIAGNOSIS — I120 Hypertensive chronic kidney disease with stage 5 chronic kidney disease or end stage renal disease: Secondary | ICD-10-CM | POA: Diagnosis not present

## 2018-03-21 DIAGNOSIS — G309 Alzheimer's disease, unspecified: Secondary | ICD-10-CM | POA: Diagnosis not present

## 2018-03-21 DIAGNOSIS — R69 Illness, unspecified: Secondary | ICD-10-CM | POA: Diagnosis not present

## 2018-03-21 DIAGNOSIS — D631 Anemia in chronic kidney disease: Secondary | ICD-10-CM | POA: Diagnosis not present

## 2018-03-22 DIAGNOSIS — N2581 Secondary hyperparathyroidism of renal origin: Secondary | ICD-10-CM | POA: Diagnosis not present

## 2018-03-22 DIAGNOSIS — E1129 Type 2 diabetes mellitus with other diabetic kidney complication: Secondary | ICD-10-CM | POA: Diagnosis not present

## 2018-03-22 DIAGNOSIS — N186 End stage renal disease: Secondary | ICD-10-CM | POA: Diagnosis not present

## 2018-03-22 DIAGNOSIS — D689 Coagulation defect, unspecified: Secondary | ICD-10-CM | POA: Diagnosis not present

## 2018-03-23 DIAGNOSIS — R69 Illness, unspecified: Secondary | ICD-10-CM | POA: Diagnosis not present

## 2018-03-23 DIAGNOSIS — R278 Other lack of coordination: Secondary | ICD-10-CM | POA: Diagnosis not present

## 2018-03-24 DIAGNOSIS — N2581 Secondary hyperparathyroidism of renal origin: Secondary | ICD-10-CM | POA: Diagnosis not present

## 2018-03-24 DIAGNOSIS — N186 End stage renal disease: Secondary | ICD-10-CM | POA: Diagnosis not present

## 2018-03-24 DIAGNOSIS — R69 Illness, unspecified: Secondary | ICD-10-CM | POA: Diagnosis not present

## 2018-03-24 DIAGNOSIS — D689 Coagulation defect, unspecified: Secondary | ICD-10-CM | POA: Diagnosis not present

## 2018-03-24 DIAGNOSIS — E1129 Type 2 diabetes mellitus with other diabetic kidney complication: Secondary | ICD-10-CM | POA: Diagnosis not present

## 2018-03-24 DIAGNOSIS — R278 Other lack of coordination: Secondary | ICD-10-CM | POA: Diagnosis not present

## 2018-03-25 DIAGNOSIS — R69 Illness, unspecified: Secondary | ICD-10-CM | POA: Diagnosis not present

## 2018-03-25 DIAGNOSIS — R278 Other lack of coordination: Secondary | ICD-10-CM | POA: Diagnosis not present

## 2018-03-27 DIAGNOSIS — N186 End stage renal disease: Secondary | ICD-10-CM | POA: Diagnosis not present

## 2018-03-27 DIAGNOSIS — E1129 Type 2 diabetes mellitus with other diabetic kidney complication: Secondary | ICD-10-CM | POA: Diagnosis not present

## 2018-03-27 DIAGNOSIS — D689 Coagulation defect, unspecified: Secondary | ICD-10-CM | POA: Diagnosis not present

## 2018-03-27 DIAGNOSIS — N2581 Secondary hyperparathyroidism of renal origin: Secondary | ICD-10-CM | POA: Diagnosis not present

## 2018-03-27 DIAGNOSIS — R278 Other lack of coordination: Secondary | ICD-10-CM | POA: Diagnosis not present

## 2018-03-27 DIAGNOSIS — R69 Illness, unspecified: Secondary | ICD-10-CM | POA: Diagnosis not present

## 2018-03-28 DIAGNOSIS — R69 Illness, unspecified: Secondary | ICD-10-CM | POA: Diagnosis not present

## 2018-03-28 DIAGNOSIS — R278 Other lack of coordination: Secondary | ICD-10-CM | POA: Diagnosis not present

## 2018-03-29 DIAGNOSIS — N186 End stage renal disease: Secondary | ICD-10-CM | POA: Diagnosis not present

## 2018-03-29 DIAGNOSIS — R69 Illness, unspecified: Secondary | ICD-10-CM | POA: Diagnosis not present

## 2018-03-29 DIAGNOSIS — R278 Other lack of coordination: Secondary | ICD-10-CM | POA: Diagnosis not present

## 2018-03-29 DIAGNOSIS — D689 Coagulation defect, unspecified: Secondary | ICD-10-CM | POA: Diagnosis not present

## 2018-03-29 DIAGNOSIS — N2581 Secondary hyperparathyroidism of renal origin: Secondary | ICD-10-CM | POA: Diagnosis not present

## 2018-03-29 DIAGNOSIS — E1129 Type 2 diabetes mellitus with other diabetic kidney complication: Secondary | ICD-10-CM | POA: Diagnosis not present

## 2018-03-30 DIAGNOSIS — R69 Illness, unspecified: Secondary | ICD-10-CM | POA: Diagnosis not present

## 2018-03-30 DIAGNOSIS — R278 Other lack of coordination: Secondary | ICD-10-CM | POA: Diagnosis not present

## 2018-03-31 DIAGNOSIS — E1129 Type 2 diabetes mellitus with other diabetic kidney complication: Secondary | ICD-10-CM | POA: Diagnosis not present

## 2018-03-31 DIAGNOSIS — R69 Illness, unspecified: Secondary | ICD-10-CM | POA: Diagnosis not present

## 2018-03-31 DIAGNOSIS — N2581 Secondary hyperparathyroidism of renal origin: Secondary | ICD-10-CM | POA: Diagnosis not present

## 2018-03-31 DIAGNOSIS — R278 Other lack of coordination: Secondary | ICD-10-CM | POA: Diagnosis not present

## 2018-03-31 DIAGNOSIS — D689 Coagulation defect, unspecified: Secondary | ICD-10-CM | POA: Diagnosis not present

## 2018-03-31 DIAGNOSIS — N186 End stage renal disease: Secondary | ICD-10-CM | POA: Diagnosis not present

## 2018-04-01 DIAGNOSIS — R69 Illness, unspecified: Secondary | ICD-10-CM | POA: Diagnosis not present

## 2018-04-01 DIAGNOSIS — R278 Other lack of coordination: Secondary | ICD-10-CM | POA: Diagnosis not present

## 2018-04-02 DIAGNOSIS — R278 Other lack of coordination: Secondary | ICD-10-CM | POA: Diagnosis not present

## 2018-04-02 DIAGNOSIS — R69 Illness, unspecified: Secondary | ICD-10-CM | POA: Diagnosis not present

## 2018-04-03 DIAGNOSIS — R69 Illness, unspecified: Secondary | ICD-10-CM | POA: Diagnosis not present

## 2018-04-03 DIAGNOSIS — R278 Other lack of coordination: Secondary | ICD-10-CM | POA: Diagnosis not present

## 2018-04-03 DIAGNOSIS — N2581 Secondary hyperparathyroidism of renal origin: Secondary | ICD-10-CM | POA: Diagnosis not present

## 2018-04-03 DIAGNOSIS — N186 End stage renal disease: Secondary | ICD-10-CM | POA: Diagnosis not present

## 2018-04-03 DIAGNOSIS — E1129 Type 2 diabetes mellitus with other diabetic kidney complication: Secondary | ICD-10-CM | POA: Diagnosis not present

## 2018-04-03 DIAGNOSIS — D689 Coagulation defect, unspecified: Secondary | ICD-10-CM | POA: Diagnosis not present

## 2018-04-04 DIAGNOSIS — R278 Other lack of coordination: Secondary | ICD-10-CM | POA: Diagnosis not present

## 2018-04-04 DIAGNOSIS — N186 End stage renal disease: Secondary | ICD-10-CM | POA: Diagnosis not present

## 2018-04-04 DIAGNOSIS — Z992 Dependence on renal dialysis: Secondary | ICD-10-CM | POA: Diagnosis not present

## 2018-04-04 DIAGNOSIS — I129 Hypertensive chronic kidney disease with stage 1 through stage 4 chronic kidney disease, or unspecified chronic kidney disease: Secondary | ICD-10-CM | POA: Diagnosis not present

## 2018-04-04 DIAGNOSIS — R69 Illness, unspecified: Secondary | ICD-10-CM | POA: Diagnosis not present

## 2018-04-05 DIAGNOSIS — N2581 Secondary hyperparathyroidism of renal origin: Secondary | ICD-10-CM | POA: Diagnosis not present

## 2018-04-05 DIAGNOSIS — R69 Illness, unspecified: Secondary | ICD-10-CM | POA: Diagnosis not present

## 2018-04-05 DIAGNOSIS — R278 Other lack of coordination: Secondary | ICD-10-CM | POA: Diagnosis not present

## 2018-04-05 DIAGNOSIS — D631 Anemia in chronic kidney disease: Secondary | ICD-10-CM | POA: Diagnosis not present

## 2018-04-05 DIAGNOSIS — Z23 Encounter for immunization: Secondary | ICD-10-CM | POA: Diagnosis not present

## 2018-04-05 DIAGNOSIS — E1129 Type 2 diabetes mellitus with other diabetic kidney complication: Secondary | ICD-10-CM | POA: Diagnosis not present

## 2018-04-05 DIAGNOSIS — D689 Coagulation defect, unspecified: Secondary | ICD-10-CM | POA: Diagnosis not present

## 2018-04-05 DIAGNOSIS — N186 End stage renal disease: Secondary | ICD-10-CM | POA: Diagnosis not present

## 2018-04-05 DIAGNOSIS — E039 Hypothyroidism, unspecified: Secondary | ICD-10-CM | POA: Diagnosis not present

## 2018-04-06 DIAGNOSIS — R69 Illness, unspecified: Secondary | ICD-10-CM | POA: Diagnosis not present

## 2018-04-06 DIAGNOSIS — R278 Other lack of coordination: Secondary | ICD-10-CM | POA: Diagnosis not present

## 2018-04-07 DIAGNOSIS — N2581 Secondary hyperparathyroidism of renal origin: Secondary | ICD-10-CM | POA: Diagnosis not present

## 2018-04-07 DIAGNOSIS — N186 End stage renal disease: Secondary | ICD-10-CM | POA: Diagnosis not present

## 2018-04-07 DIAGNOSIS — E1129 Type 2 diabetes mellitus with other diabetic kidney complication: Secondary | ICD-10-CM | POA: Diagnosis not present

## 2018-04-07 DIAGNOSIS — E039 Hypothyroidism, unspecified: Secondary | ICD-10-CM | POA: Diagnosis not present

## 2018-04-07 DIAGNOSIS — D631 Anemia in chronic kidney disease: Secondary | ICD-10-CM | POA: Diagnosis not present

## 2018-04-07 DIAGNOSIS — Z23 Encounter for immunization: Secondary | ICD-10-CM | POA: Diagnosis not present

## 2018-04-07 DIAGNOSIS — D689 Coagulation defect, unspecified: Secondary | ICD-10-CM | POA: Diagnosis not present

## 2018-04-10 DIAGNOSIS — E039 Hypothyroidism, unspecified: Secondary | ICD-10-CM | POA: Diagnosis not present

## 2018-04-10 DIAGNOSIS — Z23 Encounter for immunization: Secondary | ICD-10-CM | POA: Diagnosis not present

## 2018-04-10 DIAGNOSIS — N2581 Secondary hyperparathyroidism of renal origin: Secondary | ICD-10-CM | POA: Diagnosis not present

## 2018-04-10 DIAGNOSIS — E1129 Type 2 diabetes mellitus with other diabetic kidney complication: Secondary | ICD-10-CM | POA: Diagnosis not present

## 2018-04-10 DIAGNOSIS — D689 Coagulation defect, unspecified: Secondary | ICD-10-CM | POA: Diagnosis not present

## 2018-04-10 DIAGNOSIS — N186 End stage renal disease: Secondary | ICD-10-CM | POA: Diagnosis not present

## 2018-04-10 DIAGNOSIS — D631 Anemia in chronic kidney disease: Secondary | ICD-10-CM | POA: Diagnosis not present

## 2018-04-11 DIAGNOSIS — R278 Other lack of coordination: Secondary | ICD-10-CM | POA: Diagnosis not present

## 2018-04-11 DIAGNOSIS — R69 Illness, unspecified: Secondary | ICD-10-CM | POA: Diagnosis not present

## 2018-04-12 DIAGNOSIS — E039 Hypothyroidism, unspecified: Secondary | ICD-10-CM | POA: Diagnosis not present

## 2018-04-12 DIAGNOSIS — R278 Other lack of coordination: Secondary | ICD-10-CM | POA: Diagnosis not present

## 2018-04-12 DIAGNOSIS — D631 Anemia in chronic kidney disease: Secondary | ICD-10-CM | POA: Diagnosis not present

## 2018-04-12 DIAGNOSIS — N186 End stage renal disease: Secondary | ICD-10-CM | POA: Diagnosis not present

## 2018-04-12 DIAGNOSIS — R69 Illness, unspecified: Secondary | ICD-10-CM | POA: Diagnosis not present

## 2018-04-12 DIAGNOSIS — N2581 Secondary hyperparathyroidism of renal origin: Secondary | ICD-10-CM | POA: Diagnosis not present

## 2018-04-12 DIAGNOSIS — E1129 Type 2 diabetes mellitus with other diabetic kidney complication: Secondary | ICD-10-CM | POA: Diagnosis not present

## 2018-04-12 DIAGNOSIS — Z23 Encounter for immunization: Secondary | ICD-10-CM | POA: Diagnosis not present

## 2018-04-12 DIAGNOSIS — Z20828 Contact with and (suspected) exposure to other viral communicable diseases: Secondary | ICD-10-CM | POA: Diagnosis not present

## 2018-04-12 DIAGNOSIS — D689 Coagulation defect, unspecified: Secondary | ICD-10-CM | POA: Diagnosis not present

## 2018-04-13 DIAGNOSIS — R69 Illness, unspecified: Secondary | ICD-10-CM | POA: Diagnosis not present

## 2018-04-13 DIAGNOSIS — R278 Other lack of coordination: Secondary | ICD-10-CM | POA: Diagnosis not present

## 2018-04-14 DIAGNOSIS — D631 Anemia in chronic kidney disease: Secondary | ICD-10-CM | POA: Diagnosis not present

## 2018-04-14 DIAGNOSIS — N186 End stage renal disease: Secondary | ICD-10-CM | POA: Diagnosis not present

## 2018-04-14 DIAGNOSIS — Z23 Encounter for immunization: Secondary | ICD-10-CM | POA: Diagnosis not present

## 2018-04-14 DIAGNOSIS — D689 Coagulation defect, unspecified: Secondary | ICD-10-CM | POA: Diagnosis not present

## 2018-04-14 DIAGNOSIS — E1129 Type 2 diabetes mellitus with other diabetic kidney complication: Secondary | ICD-10-CM | POA: Diagnosis not present

## 2018-04-14 DIAGNOSIS — E039 Hypothyroidism, unspecified: Secondary | ICD-10-CM | POA: Diagnosis not present

## 2018-04-14 DIAGNOSIS — N2581 Secondary hyperparathyroidism of renal origin: Secondary | ICD-10-CM | POA: Diagnosis not present

## 2018-04-15 DIAGNOSIS — R69 Illness, unspecified: Secondary | ICD-10-CM | POA: Diagnosis not present

## 2018-04-15 DIAGNOSIS — R278 Other lack of coordination: Secondary | ICD-10-CM | POA: Diagnosis not present

## 2018-04-16 DIAGNOSIS — R278 Other lack of coordination: Secondary | ICD-10-CM | POA: Diagnosis not present

## 2018-04-16 DIAGNOSIS — R69 Illness, unspecified: Secondary | ICD-10-CM | POA: Diagnosis not present

## 2018-04-17 DIAGNOSIS — N2581 Secondary hyperparathyroidism of renal origin: Secondary | ICD-10-CM | POA: Diagnosis not present

## 2018-04-17 DIAGNOSIS — N186 End stage renal disease: Secondary | ICD-10-CM | POA: Diagnosis not present

## 2018-04-17 DIAGNOSIS — R278 Other lack of coordination: Secondary | ICD-10-CM | POA: Diagnosis not present

## 2018-04-17 DIAGNOSIS — R69 Illness, unspecified: Secondary | ICD-10-CM | POA: Diagnosis not present

## 2018-04-17 DIAGNOSIS — D631 Anemia in chronic kidney disease: Secondary | ICD-10-CM | POA: Diagnosis not present

## 2018-04-17 DIAGNOSIS — E1129 Type 2 diabetes mellitus with other diabetic kidney complication: Secondary | ICD-10-CM | POA: Diagnosis not present

## 2018-04-17 DIAGNOSIS — Z23 Encounter for immunization: Secondary | ICD-10-CM | POA: Diagnosis not present

## 2018-04-17 DIAGNOSIS — E039 Hypothyroidism, unspecified: Secondary | ICD-10-CM | POA: Diagnosis not present

## 2018-04-17 DIAGNOSIS — D689 Coagulation defect, unspecified: Secondary | ICD-10-CM | POA: Diagnosis not present

## 2018-04-19 DIAGNOSIS — D689 Coagulation defect, unspecified: Secondary | ICD-10-CM | POA: Diagnosis not present

## 2018-04-19 DIAGNOSIS — D631 Anemia in chronic kidney disease: Secondary | ICD-10-CM | POA: Diagnosis not present

## 2018-04-19 DIAGNOSIS — E039 Hypothyroidism, unspecified: Secondary | ICD-10-CM | POA: Diagnosis not present

## 2018-04-19 DIAGNOSIS — N186 End stage renal disease: Secondary | ICD-10-CM | POA: Diagnosis not present

## 2018-04-19 DIAGNOSIS — E1129 Type 2 diabetes mellitus with other diabetic kidney complication: Secondary | ICD-10-CM | POA: Diagnosis not present

## 2018-04-19 DIAGNOSIS — N2581 Secondary hyperparathyroidism of renal origin: Secondary | ICD-10-CM | POA: Diagnosis not present

## 2018-04-19 DIAGNOSIS — Z23 Encounter for immunization: Secondary | ICD-10-CM | POA: Diagnosis not present

## 2018-04-20 DIAGNOSIS — T82858A Stenosis of vascular prosthetic devices, implants and grafts, initial encounter: Secondary | ICD-10-CM | POA: Diagnosis not present

## 2018-04-20 DIAGNOSIS — I739 Peripheral vascular disease, unspecified: Secondary | ICD-10-CM | POA: Diagnosis not present

## 2018-04-20 DIAGNOSIS — Z992 Dependence on renal dialysis: Secondary | ICD-10-CM | POA: Diagnosis not present

## 2018-04-20 DIAGNOSIS — I871 Compression of vein: Secondary | ICD-10-CM | POA: Diagnosis not present

## 2018-04-20 DIAGNOSIS — B351 Tinea unguium: Secondary | ICD-10-CM | POA: Diagnosis not present

## 2018-04-20 DIAGNOSIS — L603 Nail dystrophy: Secondary | ICD-10-CM | POA: Diagnosis not present

## 2018-04-20 DIAGNOSIS — N186 End stage renal disease: Secondary | ICD-10-CM | POA: Diagnosis not present

## 2018-04-21 DIAGNOSIS — E039 Hypothyroidism, unspecified: Secondary | ICD-10-CM | POA: Diagnosis not present

## 2018-04-21 DIAGNOSIS — D631 Anemia in chronic kidney disease: Secondary | ICD-10-CM | POA: Diagnosis not present

## 2018-04-21 DIAGNOSIS — E1129 Type 2 diabetes mellitus with other diabetic kidney complication: Secondary | ICD-10-CM | POA: Diagnosis not present

## 2018-04-21 DIAGNOSIS — N2581 Secondary hyperparathyroidism of renal origin: Secondary | ICD-10-CM | POA: Diagnosis not present

## 2018-04-21 DIAGNOSIS — Z23 Encounter for immunization: Secondary | ICD-10-CM | POA: Diagnosis not present

## 2018-04-21 DIAGNOSIS — N186 End stage renal disease: Secondary | ICD-10-CM | POA: Diagnosis not present

## 2018-04-21 DIAGNOSIS — D689 Coagulation defect, unspecified: Secondary | ICD-10-CM | POA: Diagnosis not present

## 2018-04-24 DIAGNOSIS — E039 Hypothyroidism, unspecified: Secondary | ICD-10-CM | POA: Diagnosis not present

## 2018-04-24 DIAGNOSIS — D631 Anemia in chronic kidney disease: Secondary | ICD-10-CM | POA: Diagnosis not present

## 2018-04-24 DIAGNOSIS — E1129 Type 2 diabetes mellitus with other diabetic kidney complication: Secondary | ICD-10-CM | POA: Diagnosis not present

## 2018-04-24 DIAGNOSIS — Z23 Encounter for immunization: Secondary | ICD-10-CM | POA: Diagnosis not present

## 2018-04-24 DIAGNOSIS — N2581 Secondary hyperparathyroidism of renal origin: Secondary | ICD-10-CM | POA: Diagnosis not present

## 2018-04-24 DIAGNOSIS — D689 Coagulation defect, unspecified: Secondary | ICD-10-CM | POA: Diagnosis not present

## 2018-04-24 DIAGNOSIS — N186 End stage renal disease: Secondary | ICD-10-CM | POA: Diagnosis not present

## 2018-04-26 DIAGNOSIS — E039 Hypothyroidism, unspecified: Secondary | ICD-10-CM | POA: Diagnosis not present

## 2018-04-26 DIAGNOSIS — E1129 Type 2 diabetes mellitus with other diabetic kidney complication: Secondary | ICD-10-CM | POA: Diagnosis not present

## 2018-04-26 DIAGNOSIS — N2581 Secondary hyperparathyroidism of renal origin: Secondary | ICD-10-CM | POA: Diagnosis not present

## 2018-04-26 DIAGNOSIS — D689 Coagulation defect, unspecified: Secondary | ICD-10-CM | POA: Diagnosis not present

## 2018-04-26 DIAGNOSIS — N186 End stage renal disease: Secondary | ICD-10-CM | POA: Diagnosis not present

## 2018-04-26 DIAGNOSIS — D631 Anemia in chronic kidney disease: Secondary | ICD-10-CM | POA: Diagnosis not present

## 2018-04-26 DIAGNOSIS — Z23 Encounter for immunization: Secondary | ICD-10-CM | POA: Diagnosis not present

## 2018-04-28 DIAGNOSIS — N186 End stage renal disease: Secondary | ICD-10-CM | POA: Diagnosis not present

## 2018-04-28 DIAGNOSIS — Z23 Encounter for immunization: Secondary | ICD-10-CM | POA: Diagnosis not present

## 2018-04-28 DIAGNOSIS — D631 Anemia in chronic kidney disease: Secondary | ICD-10-CM | POA: Diagnosis not present

## 2018-04-28 DIAGNOSIS — N2581 Secondary hyperparathyroidism of renal origin: Secondary | ICD-10-CM | POA: Diagnosis not present

## 2018-04-28 DIAGNOSIS — D689 Coagulation defect, unspecified: Secondary | ICD-10-CM | POA: Diagnosis not present

## 2018-04-28 DIAGNOSIS — E039 Hypothyroidism, unspecified: Secondary | ICD-10-CM | POA: Diagnosis not present

## 2018-04-28 DIAGNOSIS — E1129 Type 2 diabetes mellitus with other diabetic kidney complication: Secondary | ICD-10-CM | POA: Diagnosis not present

## 2018-05-01 DIAGNOSIS — E1129 Type 2 diabetes mellitus with other diabetic kidney complication: Secondary | ICD-10-CM | POA: Diagnosis not present

## 2018-05-01 DIAGNOSIS — D631 Anemia in chronic kidney disease: Secondary | ICD-10-CM | POA: Diagnosis not present

## 2018-05-01 DIAGNOSIS — Z23 Encounter for immunization: Secondary | ICD-10-CM | POA: Diagnosis not present

## 2018-05-01 DIAGNOSIS — E039 Hypothyroidism, unspecified: Secondary | ICD-10-CM | POA: Diagnosis not present

## 2018-05-01 DIAGNOSIS — N2581 Secondary hyperparathyroidism of renal origin: Secondary | ICD-10-CM | POA: Diagnosis not present

## 2018-05-01 DIAGNOSIS — D689 Coagulation defect, unspecified: Secondary | ICD-10-CM | POA: Diagnosis not present

## 2018-05-01 DIAGNOSIS — N186 End stage renal disease: Secondary | ICD-10-CM | POA: Diagnosis not present

## 2018-05-03 DIAGNOSIS — D631 Anemia in chronic kidney disease: Secondary | ICD-10-CM | POA: Diagnosis not present

## 2018-05-03 DIAGNOSIS — N2581 Secondary hyperparathyroidism of renal origin: Secondary | ICD-10-CM | POA: Diagnosis not present

## 2018-05-03 DIAGNOSIS — N186 End stage renal disease: Secondary | ICD-10-CM | POA: Diagnosis not present

## 2018-05-03 DIAGNOSIS — Z23 Encounter for immunization: Secondary | ICD-10-CM | POA: Diagnosis not present

## 2018-05-03 DIAGNOSIS — D689 Coagulation defect, unspecified: Secondary | ICD-10-CM | POA: Diagnosis not present

## 2018-05-03 DIAGNOSIS — E039 Hypothyroidism, unspecified: Secondary | ICD-10-CM | POA: Diagnosis not present

## 2018-05-03 DIAGNOSIS — E1129 Type 2 diabetes mellitus with other diabetic kidney complication: Secondary | ICD-10-CM | POA: Diagnosis not present

## 2018-05-04 DIAGNOSIS — I129 Hypertensive chronic kidney disease with stage 1 through stage 4 chronic kidney disease, or unspecified chronic kidney disease: Secondary | ICD-10-CM | POA: Diagnosis not present

## 2018-05-04 DIAGNOSIS — N186 End stage renal disease: Secondary | ICD-10-CM | POA: Diagnosis not present

## 2018-05-04 DIAGNOSIS — Z992 Dependence on renal dialysis: Secondary | ICD-10-CM | POA: Diagnosis not present

## 2018-05-05 DIAGNOSIS — D689 Coagulation defect, unspecified: Secondary | ICD-10-CM | POA: Diagnosis not present

## 2018-05-05 DIAGNOSIS — D631 Anemia in chronic kidney disease: Secondary | ICD-10-CM | POA: Diagnosis not present

## 2018-05-05 DIAGNOSIS — R197 Diarrhea, unspecified: Secondary | ICD-10-CM | POA: Diagnosis not present

## 2018-05-05 DIAGNOSIS — E1129 Type 2 diabetes mellitus with other diabetic kidney complication: Secondary | ICD-10-CM | POA: Diagnosis not present

## 2018-05-05 DIAGNOSIS — N2581 Secondary hyperparathyroidism of renal origin: Secondary | ICD-10-CM | POA: Diagnosis not present

## 2018-05-05 DIAGNOSIS — N186 End stage renal disease: Secondary | ICD-10-CM | POA: Diagnosis not present

## 2018-05-08 DIAGNOSIS — N2581 Secondary hyperparathyroidism of renal origin: Secondary | ICD-10-CM | POA: Diagnosis not present

## 2018-05-08 DIAGNOSIS — E1129 Type 2 diabetes mellitus with other diabetic kidney complication: Secondary | ICD-10-CM | POA: Diagnosis not present

## 2018-05-08 DIAGNOSIS — R197 Diarrhea, unspecified: Secondary | ICD-10-CM | POA: Diagnosis not present

## 2018-05-08 DIAGNOSIS — D631 Anemia in chronic kidney disease: Secondary | ICD-10-CM | POA: Diagnosis not present

## 2018-05-08 DIAGNOSIS — N186 End stage renal disease: Secondary | ICD-10-CM | POA: Diagnosis not present

## 2018-05-08 DIAGNOSIS — D689 Coagulation defect, unspecified: Secondary | ICD-10-CM | POA: Diagnosis not present

## 2018-05-09 DIAGNOSIS — D631 Anemia in chronic kidney disease: Secondary | ICD-10-CM | POA: Diagnosis not present

## 2018-05-09 DIAGNOSIS — M10312 Gout due to renal impairment, left shoulder: Secondary | ICD-10-CM | POA: Diagnosis not present

## 2018-05-09 DIAGNOSIS — R69 Illness, unspecified: Secondary | ICD-10-CM | POA: Diagnosis not present

## 2018-05-09 DIAGNOSIS — I120 Hypertensive chronic kidney disease with stage 5 chronic kidney disease or end stage renal disease: Secondary | ICD-10-CM | POA: Diagnosis not present

## 2018-05-10 DIAGNOSIS — D631 Anemia in chronic kidney disease: Secondary | ICD-10-CM | POA: Diagnosis not present

## 2018-05-10 DIAGNOSIS — E1129 Type 2 diabetes mellitus with other diabetic kidney complication: Secondary | ICD-10-CM | POA: Diagnosis not present

## 2018-05-10 DIAGNOSIS — R197 Diarrhea, unspecified: Secondary | ICD-10-CM | POA: Diagnosis not present

## 2018-05-10 DIAGNOSIS — N186 End stage renal disease: Secondary | ICD-10-CM | POA: Diagnosis not present

## 2018-05-10 DIAGNOSIS — D689 Coagulation defect, unspecified: Secondary | ICD-10-CM | POA: Diagnosis not present

## 2018-05-10 DIAGNOSIS — N2581 Secondary hyperparathyroidism of renal origin: Secondary | ICD-10-CM | POA: Diagnosis not present

## 2018-05-12 DIAGNOSIS — D631 Anemia in chronic kidney disease: Secondary | ICD-10-CM | POA: Diagnosis not present

## 2018-05-12 DIAGNOSIS — N2581 Secondary hyperparathyroidism of renal origin: Secondary | ICD-10-CM | POA: Diagnosis not present

## 2018-05-12 DIAGNOSIS — R197 Diarrhea, unspecified: Secondary | ICD-10-CM | POA: Diagnosis not present

## 2018-05-12 DIAGNOSIS — D689 Coagulation defect, unspecified: Secondary | ICD-10-CM | POA: Diagnosis not present

## 2018-05-12 DIAGNOSIS — E1129 Type 2 diabetes mellitus with other diabetic kidney complication: Secondary | ICD-10-CM | POA: Diagnosis not present

## 2018-05-12 DIAGNOSIS — N186 End stage renal disease: Secondary | ICD-10-CM | POA: Diagnosis not present

## 2018-05-15 DIAGNOSIS — E1129 Type 2 diabetes mellitus with other diabetic kidney complication: Secondary | ICD-10-CM | POA: Diagnosis not present

## 2018-05-15 DIAGNOSIS — D689 Coagulation defect, unspecified: Secondary | ICD-10-CM | POA: Diagnosis not present

## 2018-05-15 DIAGNOSIS — D631 Anemia in chronic kidney disease: Secondary | ICD-10-CM | POA: Diagnosis not present

## 2018-05-15 DIAGNOSIS — R197 Diarrhea, unspecified: Secondary | ICD-10-CM | POA: Diagnosis not present

## 2018-05-15 DIAGNOSIS — N2581 Secondary hyperparathyroidism of renal origin: Secondary | ICD-10-CM | POA: Diagnosis not present

## 2018-05-15 DIAGNOSIS — N186 End stage renal disease: Secondary | ICD-10-CM | POA: Diagnosis not present

## 2018-05-17 DIAGNOSIS — D631 Anemia in chronic kidney disease: Secondary | ICD-10-CM | POA: Diagnosis not present

## 2018-05-17 DIAGNOSIS — D689 Coagulation defect, unspecified: Secondary | ICD-10-CM | POA: Diagnosis not present

## 2018-05-17 DIAGNOSIS — N2581 Secondary hyperparathyroidism of renal origin: Secondary | ICD-10-CM | POA: Diagnosis not present

## 2018-05-17 DIAGNOSIS — N186 End stage renal disease: Secondary | ICD-10-CM | POA: Diagnosis not present

## 2018-05-17 DIAGNOSIS — E1129 Type 2 diabetes mellitus with other diabetic kidney complication: Secondary | ICD-10-CM | POA: Diagnosis not present

## 2018-05-17 DIAGNOSIS — R197 Diarrhea, unspecified: Secondary | ICD-10-CM | POA: Diagnosis not present

## 2018-05-19 DIAGNOSIS — N2581 Secondary hyperparathyroidism of renal origin: Secondary | ICD-10-CM | POA: Diagnosis not present

## 2018-05-19 DIAGNOSIS — D631 Anemia in chronic kidney disease: Secondary | ICD-10-CM | POA: Diagnosis not present

## 2018-05-19 DIAGNOSIS — D689 Coagulation defect, unspecified: Secondary | ICD-10-CM | POA: Diagnosis not present

## 2018-05-19 DIAGNOSIS — M79673 Pain in unspecified foot: Secondary | ICD-10-CM | POA: Diagnosis not present

## 2018-05-19 DIAGNOSIS — I120 Hypertensive chronic kidney disease with stage 5 chronic kidney disease or end stage renal disease: Secondary | ICD-10-CM | POA: Diagnosis not present

## 2018-05-19 DIAGNOSIS — M10312 Gout due to renal impairment, left shoulder: Secondary | ICD-10-CM | POA: Diagnosis not present

## 2018-05-19 DIAGNOSIS — R197 Diarrhea, unspecified: Secondary | ICD-10-CM | POA: Diagnosis not present

## 2018-05-19 DIAGNOSIS — G309 Alzheimer's disease, unspecified: Secondary | ICD-10-CM | POA: Diagnosis not present

## 2018-05-19 DIAGNOSIS — N186 End stage renal disease: Secondary | ICD-10-CM | POA: Diagnosis not present

## 2018-05-19 DIAGNOSIS — E1129 Type 2 diabetes mellitus with other diabetic kidney complication: Secondary | ICD-10-CM | POA: Diagnosis not present

## 2018-05-22 DIAGNOSIS — R197 Diarrhea, unspecified: Secondary | ICD-10-CM | POA: Diagnosis not present

## 2018-05-22 DIAGNOSIS — N2581 Secondary hyperparathyroidism of renal origin: Secondary | ICD-10-CM | POA: Diagnosis not present

## 2018-05-22 DIAGNOSIS — N186 End stage renal disease: Secondary | ICD-10-CM | POA: Diagnosis not present

## 2018-05-22 DIAGNOSIS — D689 Coagulation defect, unspecified: Secondary | ICD-10-CM | POA: Diagnosis not present

## 2018-05-22 DIAGNOSIS — E1129 Type 2 diabetes mellitus with other diabetic kidney complication: Secondary | ICD-10-CM | POA: Diagnosis not present

## 2018-05-22 DIAGNOSIS — D631 Anemia in chronic kidney disease: Secondary | ICD-10-CM | POA: Diagnosis not present

## 2018-05-24 DIAGNOSIS — D689 Coagulation defect, unspecified: Secondary | ICD-10-CM | POA: Diagnosis not present

## 2018-05-24 DIAGNOSIS — E1129 Type 2 diabetes mellitus with other diabetic kidney complication: Secondary | ICD-10-CM | POA: Diagnosis not present

## 2018-05-24 DIAGNOSIS — R197 Diarrhea, unspecified: Secondary | ICD-10-CM | POA: Diagnosis not present

## 2018-05-24 DIAGNOSIS — N186 End stage renal disease: Secondary | ICD-10-CM | POA: Diagnosis not present

## 2018-05-24 DIAGNOSIS — D631 Anemia in chronic kidney disease: Secondary | ICD-10-CM | POA: Diagnosis not present

## 2018-05-24 DIAGNOSIS — N2581 Secondary hyperparathyroidism of renal origin: Secondary | ICD-10-CM | POA: Diagnosis not present

## 2018-05-26 DIAGNOSIS — D689 Coagulation defect, unspecified: Secondary | ICD-10-CM | POA: Diagnosis not present

## 2018-05-26 DIAGNOSIS — N2581 Secondary hyperparathyroidism of renal origin: Secondary | ICD-10-CM | POA: Diagnosis not present

## 2018-05-26 DIAGNOSIS — E1129 Type 2 diabetes mellitus with other diabetic kidney complication: Secondary | ICD-10-CM | POA: Diagnosis not present

## 2018-05-26 DIAGNOSIS — R197 Diarrhea, unspecified: Secondary | ICD-10-CM | POA: Diagnosis not present

## 2018-05-26 DIAGNOSIS — N186 End stage renal disease: Secondary | ICD-10-CM | POA: Diagnosis not present

## 2018-05-26 DIAGNOSIS — D631 Anemia in chronic kidney disease: Secondary | ICD-10-CM | POA: Diagnosis not present

## 2018-05-29 DIAGNOSIS — D631 Anemia in chronic kidney disease: Secondary | ICD-10-CM | POA: Diagnosis not present

## 2018-05-29 DIAGNOSIS — R197 Diarrhea, unspecified: Secondary | ICD-10-CM | POA: Diagnosis not present

## 2018-05-29 DIAGNOSIS — D689 Coagulation defect, unspecified: Secondary | ICD-10-CM | POA: Diagnosis not present

## 2018-05-29 DIAGNOSIS — N2581 Secondary hyperparathyroidism of renal origin: Secondary | ICD-10-CM | POA: Diagnosis not present

## 2018-05-29 DIAGNOSIS — E1129 Type 2 diabetes mellitus with other diabetic kidney complication: Secondary | ICD-10-CM | POA: Diagnosis not present

## 2018-05-29 DIAGNOSIS — N186 End stage renal disease: Secondary | ICD-10-CM | POA: Diagnosis not present

## 2018-05-31 DIAGNOSIS — R197 Diarrhea, unspecified: Secondary | ICD-10-CM | POA: Diagnosis not present

## 2018-05-31 DIAGNOSIS — N186 End stage renal disease: Secondary | ICD-10-CM | POA: Diagnosis not present

## 2018-05-31 DIAGNOSIS — E1129 Type 2 diabetes mellitus with other diabetic kidney complication: Secondary | ICD-10-CM | POA: Diagnosis not present

## 2018-05-31 DIAGNOSIS — N2581 Secondary hyperparathyroidism of renal origin: Secondary | ICD-10-CM | POA: Diagnosis not present

## 2018-05-31 DIAGNOSIS — D631 Anemia in chronic kidney disease: Secondary | ICD-10-CM | POA: Diagnosis not present

## 2018-05-31 DIAGNOSIS — D689 Coagulation defect, unspecified: Secondary | ICD-10-CM | POA: Diagnosis not present

## 2018-06-02 DIAGNOSIS — E1129 Type 2 diabetes mellitus with other diabetic kidney complication: Secondary | ICD-10-CM | POA: Diagnosis not present

## 2018-06-02 DIAGNOSIS — R197 Diarrhea, unspecified: Secondary | ICD-10-CM | POA: Diagnosis not present

## 2018-06-02 DIAGNOSIS — N2581 Secondary hyperparathyroidism of renal origin: Secondary | ICD-10-CM | POA: Diagnosis not present

## 2018-06-02 DIAGNOSIS — N186 End stage renal disease: Secondary | ICD-10-CM | POA: Diagnosis not present

## 2018-06-02 DIAGNOSIS — D631 Anemia in chronic kidney disease: Secondary | ICD-10-CM | POA: Diagnosis not present

## 2018-06-02 DIAGNOSIS — D689 Coagulation defect, unspecified: Secondary | ICD-10-CM | POA: Diagnosis not present

## 2018-06-04 DIAGNOSIS — N186 End stage renal disease: Secondary | ICD-10-CM | POA: Diagnosis not present

## 2018-06-04 DIAGNOSIS — I129 Hypertensive chronic kidney disease with stage 1 through stage 4 chronic kidney disease, or unspecified chronic kidney disease: Secondary | ICD-10-CM | POA: Diagnosis not present

## 2018-06-04 DIAGNOSIS — Z992 Dependence on renal dialysis: Secondary | ICD-10-CM | POA: Diagnosis not present

## 2018-06-04 DIAGNOSIS — Z20818 Contact with and (suspected) exposure to other bacterial communicable diseases: Secondary | ICD-10-CM | POA: Diagnosis not present

## 2018-06-04 DIAGNOSIS — Z1383 Encounter for screening for respiratory disorder NEC: Secondary | ICD-10-CM | POA: Diagnosis not present

## 2018-06-05 DIAGNOSIS — N186 End stage renal disease: Secondary | ICD-10-CM | POA: Diagnosis not present

## 2018-06-05 DIAGNOSIS — D689 Coagulation defect, unspecified: Secondary | ICD-10-CM | POA: Diagnosis not present

## 2018-06-05 DIAGNOSIS — N2581 Secondary hyperparathyroidism of renal origin: Secondary | ICD-10-CM | POA: Diagnosis not present

## 2018-06-05 DIAGNOSIS — D631 Anemia in chronic kidney disease: Secondary | ICD-10-CM | POA: Diagnosis not present

## 2018-06-05 DIAGNOSIS — D509 Iron deficiency anemia, unspecified: Secondary | ICD-10-CM | POA: Diagnosis not present

## 2018-06-07 DIAGNOSIS — D689 Coagulation defect, unspecified: Secondary | ICD-10-CM | POA: Diagnosis not present

## 2018-06-07 DIAGNOSIS — N2581 Secondary hyperparathyroidism of renal origin: Secondary | ICD-10-CM | POA: Diagnosis not present

## 2018-06-07 DIAGNOSIS — N186 End stage renal disease: Secondary | ICD-10-CM | POA: Diagnosis not present

## 2018-06-07 DIAGNOSIS — D631 Anemia in chronic kidney disease: Secondary | ICD-10-CM | POA: Diagnosis not present

## 2018-06-07 DIAGNOSIS — D509 Iron deficiency anemia, unspecified: Secondary | ICD-10-CM | POA: Diagnosis not present

## 2018-06-09 DIAGNOSIS — D631 Anemia in chronic kidney disease: Secondary | ICD-10-CM | POA: Diagnosis not present

## 2018-06-09 DIAGNOSIS — N2581 Secondary hyperparathyroidism of renal origin: Secondary | ICD-10-CM | POA: Diagnosis not present

## 2018-06-09 DIAGNOSIS — D689 Coagulation defect, unspecified: Secondary | ICD-10-CM | POA: Diagnosis not present

## 2018-06-09 DIAGNOSIS — N186 End stage renal disease: Secondary | ICD-10-CM | POA: Diagnosis not present

## 2018-06-09 DIAGNOSIS — D509 Iron deficiency anemia, unspecified: Secondary | ICD-10-CM | POA: Diagnosis not present

## 2018-06-12 DIAGNOSIS — N186 End stage renal disease: Secondary | ICD-10-CM | POA: Diagnosis not present

## 2018-06-12 DIAGNOSIS — D631 Anemia in chronic kidney disease: Secondary | ICD-10-CM | POA: Diagnosis not present

## 2018-06-12 DIAGNOSIS — N2581 Secondary hyperparathyroidism of renal origin: Secondary | ICD-10-CM | POA: Diagnosis not present

## 2018-06-12 DIAGNOSIS — D689 Coagulation defect, unspecified: Secondary | ICD-10-CM | POA: Diagnosis not present

## 2018-06-12 DIAGNOSIS — D509 Iron deficiency anemia, unspecified: Secondary | ICD-10-CM | POA: Diagnosis not present

## 2018-06-14 DIAGNOSIS — D509 Iron deficiency anemia, unspecified: Secondary | ICD-10-CM | POA: Diagnosis not present

## 2018-06-14 DIAGNOSIS — D631 Anemia in chronic kidney disease: Secondary | ICD-10-CM | POA: Diagnosis not present

## 2018-06-14 DIAGNOSIS — N186 End stage renal disease: Secondary | ICD-10-CM | POA: Diagnosis not present

## 2018-06-14 DIAGNOSIS — N2581 Secondary hyperparathyroidism of renal origin: Secondary | ICD-10-CM | POA: Diagnosis not present

## 2018-06-14 DIAGNOSIS — D689 Coagulation defect, unspecified: Secondary | ICD-10-CM | POA: Diagnosis not present

## 2018-06-16 DIAGNOSIS — D689 Coagulation defect, unspecified: Secondary | ICD-10-CM | POA: Diagnosis not present

## 2018-06-16 DIAGNOSIS — N186 End stage renal disease: Secondary | ICD-10-CM | POA: Diagnosis not present

## 2018-06-16 DIAGNOSIS — D631 Anemia in chronic kidney disease: Secondary | ICD-10-CM | POA: Diagnosis not present

## 2018-06-16 DIAGNOSIS — D509 Iron deficiency anemia, unspecified: Secondary | ICD-10-CM | POA: Diagnosis not present

## 2018-06-16 DIAGNOSIS — N2581 Secondary hyperparathyroidism of renal origin: Secondary | ICD-10-CM | POA: Diagnosis not present

## 2018-06-19 DIAGNOSIS — D689 Coagulation defect, unspecified: Secondary | ICD-10-CM | POA: Diagnosis not present

## 2018-06-19 DIAGNOSIS — N186 End stage renal disease: Secondary | ICD-10-CM | POA: Diagnosis not present

## 2018-06-19 DIAGNOSIS — N2581 Secondary hyperparathyroidism of renal origin: Secondary | ICD-10-CM | POA: Diagnosis not present

## 2018-06-19 DIAGNOSIS — D631 Anemia in chronic kidney disease: Secondary | ICD-10-CM | POA: Diagnosis not present

## 2018-06-19 DIAGNOSIS — D509 Iron deficiency anemia, unspecified: Secondary | ICD-10-CM | POA: Diagnosis not present

## 2018-06-21 DIAGNOSIS — D631 Anemia in chronic kidney disease: Secondary | ICD-10-CM | POA: Diagnosis not present

## 2018-06-21 DIAGNOSIS — D689 Coagulation defect, unspecified: Secondary | ICD-10-CM | POA: Diagnosis not present

## 2018-06-21 DIAGNOSIS — N186 End stage renal disease: Secondary | ICD-10-CM | POA: Diagnosis not present

## 2018-06-21 DIAGNOSIS — N2581 Secondary hyperparathyroidism of renal origin: Secondary | ICD-10-CM | POA: Diagnosis not present

## 2018-06-21 DIAGNOSIS — D509 Iron deficiency anemia, unspecified: Secondary | ICD-10-CM | POA: Diagnosis not present

## 2018-06-23 DIAGNOSIS — N2581 Secondary hyperparathyroidism of renal origin: Secondary | ICD-10-CM | POA: Diagnosis not present

## 2018-06-23 DIAGNOSIS — D689 Coagulation defect, unspecified: Secondary | ICD-10-CM | POA: Diagnosis not present

## 2018-06-23 DIAGNOSIS — D631 Anemia in chronic kidney disease: Secondary | ICD-10-CM | POA: Diagnosis not present

## 2018-06-23 DIAGNOSIS — D509 Iron deficiency anemia, unspecified: Secondary | ICD-10-CM | POA: Diagnosis not present

## 2018-06-23 DIAGNOSIS — N186 End stage renal disease: Secondary | ICD-10-CM | POA: Diagnosis not present

## 2018-06-26 DIAGNOSIS — D631 Anemia in chronic kidney disease: Secondary | ICD-10-CM | POA: Diagnosis not present

## 2018-06-26 DIAGNOSIS — N186 End stage renal disease: Secondary | ICD-10-CM | POA: Diagnosis not present

## 2018-06-26 DIAGNOSIS — N2581 Secondary hyperparathyroidism of renal origin: Secondary | ICD-10-CM | POA: Diagnosis not present

## 2018-06-26 DIAGNOSIS — D509 Iron deficiency anemia, unspecified: Secondary | ICD-10-CM | POA: Diagnosis not present

## 2018-06-26 DIAGNOSIS — D689 Coagulation defect, unspecified: Secondary | ICD-10-CM | POA: Diagnosis not present

## 2018-06-28 DIAGNOSIS — D689 Coagulation defect, unspecified: Secondary | ICD-10-CM | POA: Diagnosis not present

## 2018-06-28 DIAGNOSIS — N186 End stage renal disease: Secondary | ICD-10-CM | POA: Diagnosis not present

## 2018-06-28 DIAGNOSIS — D509 Iron deficiency anemia, unspecified: Secondary | ICD-10-CM | POA: Diagnosis not present

## 2018-06-28 DIAGNOSIS — D631 Anemia in chronic kidney disease: Secondary | ICD-10-CM | POA: Diagnosis not present

## 2018-06-28 DIAGNOSIS — N2581 Secondary hyperparathyroidism of renal origin: Secondary | ICD-10-CM | POA: Diagnosis not present

## 2018-06-29 DIAGNOSIS — E1151 Type 2 diabetes mellitus with diabetic peripheral angiopathy without gangrene: Secondary | ICD-10-CM | POA: Diagnosis not present

## 2018-06-29 DIAGNOSIS — Z7984 Long term (current) use of oral hypoglycemic drugs: Secondary | ICD-10-CM | POA: Diagnosis not present

## 2018-06-29 DIAGNOSIS — B351 Tinea unguium: Secondary | ICD-10-CM | POA: Diagnosis not present

## 2018-07-02 DIAGNOSIS — D631 Anemia in chronic kidney disease: Secondary | ICD-10-CM | POA: Diagnosis not present

## 2018-07-02 DIAGNOSIS — G309 Alzheimer's disease, unspecified: Secondary | ICD-10-CM | POA: Diagnosis not present

## 2018-07-02 DIAGNOSIS — M109 Gout, unspecified: Secondary | ICD-10-CM | POA: Diagnosis not present

## 2018-07-02 DIAGNOSIS — Z992 Dependence on renal dialysis: Secondary | ICD-10-CM | POA: Diagnosis not present

## 2018-07-02 DIAGNOSIS — E1129 Type 2 diabetes mellitus with other diabetic kidney complication: Secondary | ICD-10-CM | POA: Diagnosis not present

## 2018-07-02 DIAGNOSIS — J45909 Unspecified asthma, uncomplicated: Secondary | ICD-10-CM | POA: Diagnosis not present

## 2018-07-02 DIAGNOSIS — E785 Hyperlipidemia, unspecified: Secondary | ICD-10-CM | POA: Diagnosis not present

## 2018-07-02 DIAGNOSIS — I10 Essential (primary) hypertension: Secondary | ICD-10-CM | POA: Diagnosis not present

## 2018-07-03 ENCOUNTER — Telehealth: Payer: Self-pay | Admitting: *Deleted

## 2018-07-03 DIAGNOSIS — D689 Coagulation defect, unspecified: Secondary | ICD-10-CM | POA: Diagnosis not present

## 2018-07-03 DIAGNOSIS — N186 End stage renal disease: Secondary | ICD-10-CM | POA: Diagnosis not present

## 2018-07-03 DIAGNOSIS — D631 Anemia in chronic kidney disease: Secondary | ICD-10-CM | POA: Diagnosis not present

## 2018-07-03 DIAGNOSIS — N2581 Secondary hyperparathyroidism of renal origin: Secondary | ICD-10-CM | POA: Diagnosis not present

## 2018-07-03 DIAGNOSIS — D509 Iron deficiency anemia, unspecified: Secondary | ICD-10-CM | POA: Diagnosis not present

## 2018-07-03 NOTE — Telephone Encounter (Signed)
Due to current COVID 19 pandemic, our office is severely reducing in office visits until further notice, in order to minimize the risk to our patients and healthcare providers.  Pts nursing supervisor understands that although there may be some limitations with this type of visit, we will take all precautions to reduce any security or privacy concerns.  Pts nursing superviser understands that this will be treated like an in office visit and we will file with pt's insurance, and there may be a patient responsible charge related to this service. Consented to Doxy.me VV.  (will call back LMVM).

## 2018-07-03 NOTE — Telephone Encounter (Signed)
Spoke to Quillian Quince, son of pt.  Pt is currently living in [place] in [place]. [phone].  (no answer at this time). Due to current COVID 19 pandemic, our office is severely reducing in office visits until further notice, in order to minimize the risk to our patients and healthcare providers. No answer at [facility] x 2.  Will try again later.  Trying to convert to VV if able to do.

## 2018-07-04 ENCOUNTER — Ambulatory Visit: Payer: Medicare HMO | Admitting: Adult Health

## 2018-07-04 DIAGNOSIS — Z992 Dependence on renal dialysis: Secondary | ICD-10-CM | POA: Diagnosis not present

## 2018-07-04 DIAGNOSIS — I129 Hypertensive chronic kidney disease with stage 1 through stage 4 chronic kidney disease, or unspecified chronic kidney disease: Secondary | ICD-10-CM | POA: Diagnosis not present

## 2018-07-04 DIAGNOSIS — N186 End stage renal disease: Secondary | ICD-10-CM | POA: Diagnosis not present

## 2018-07-04 NOTE — Telephone Encounter (Addendum)
Spoke to [place], she stated that Banker not here yet.  Call back about 0830.  [phone].   I called and spoke to [employer], Librarian, academic at facility.  She stated that they cannot download apps or do [email or web address] via ipad [employer]).  I relayed that will need to reschedule appt to august if pt doing ok.  She stated that pt is stable.  I r/s to SS/NP 08-22-18 at 1345.  She would relay to [place] who does transportation (scheduling).  She will call back if needed.

## 2018-07-10 ENCOUNTER — Other Ambulatory Visit: Payer: Self-pay

## 2018-07-10 ENCOUNTER — Emergency Department (HOSPITAL_COMMUNITY)
Admission: EM | Admit: 2018-07-10 | Discharge: 2018-07-10 | Disposition: A | Discharge status: Home / Self Care | Payer: Medicare HMO | Attending: Emergency Medicine | Admitting: Emergency Medicine

## 2018-07-10 ENCOUNTER — Encounter (HOSPITAL_COMMUNITY): Payer: Self-pay | Admitting: *Deleted

## 2018-07-10 DIAGNOSIS — R69 Illness, unspecified: Secondary | ICD-10-CM | POA: Diagnosis not present

## 2018-07-10 DIAGNOSIS — Z7401 Bed confinement status: Secondary | ICD-10-CM | POA: Diagnosis not present

## 2018-07-10 DIAGNOSIS — Z209 Contact with and (suspected) exposure to unspecified communicable disease: Secondary | ICD-10-CM | POA: Diagnosis not present

## 2018-07-10 DIAGNOSIS — Z992 Dependence on renal dialysis: Secondary | ICD-10-CM | POA: Diagnosis not present

## 2018-07-10 DIAGNOSIS — E1122 Type 2 diabetes mellitus with diabetic chronic kidney disease: Secondary | ICD-10-CM | POA: Insufficient documentation

## 2018-07-10 DIAGNOSIS — I120 Hypertensive chronic kidney disease with stage 5 chronic kidney disease or end stage renal disease: Secondary | ICD-10-CM | POA: Insufficient documentation

## 2018-07-10 DIAGNOSIS — E785 Hyperlipidemia, unspecified: Secondary | ICD-10-CM | POA: Insufficient documentation

## 2018-07-10 DIAGNOSIS — N186 End stage renal disease: Secondary | ICD-10-CM | POA: Diagnosis not present

## 2018-07-10 DIAGNOSIS — D631 Anemia in chronic kidney disease: Secondary | ICD-10-CM | POA: Insufficient documentation

## 2018-07-10 DIAGNOSIS — Z880 Allergy status to penicillin: Secondary | ICD-10-CM | POA: Diagnosis not present

## 2018-07-10 DIAGNOSIS — Z743 Need for continuous supervision: Secondary | ICD-10-CM | POA: Diagnosis not present

## 2018-07-10 DIAGNOSIS — Z87891 Personal history of nicotine dependence: Secondary | ICD-10-CM | POA: Insufficient documentation

## 2018-07-10 DIAGNOSIS — Z79899 Other long term (current) drug therapy: Secondary | ICD-10-CM | POA: Diagnosis not present

## 2018-07-10 DIAGNOSIS — Z7982 Long term (current) use of aspirin: Secondary | ICD-10-CM | POA: Insufficient documentation

## 2018-07-10 DIAGNOSIS — I10 Essential (primary) hypertension: Secondary | ICD-10-CM | POA: Diagnosis not present

## 2018-07-10 DIAGNOSIS — F0390 Unspecified dementia without behavioral disturbance: Secondary | ICD-10-CM | POA: Insufficient documentation

## 2018-07-10 DIAGNOSIS — R5381 Other malaise: Secondary | ICD-10-CM | POA: Diagnosis not present

## 2018-07-10 DIAGNOSIS — J45909 Unspecified asthma, uncomplicated: Secondary | ICD-10-CM | POA: Insufficient documentation

## 2018-07-10 LAB — CBC
HCT: 35.1 % — ABNORMAL LOW (ref 39.0–52.0)
Hemoglobin: 11 g/dL — ABNORMAL LOW (ref 13.0–17.0)
MCH: 29.9 pg (ref 26.0–34.0)
MCHC: 31.3 g/dL (ref 30.0–36.0)
MCV: 95.4 fL (ref 80.0–100.0)
Platelets: 135 10*3/uL — ABNORMAL LOW (ref 150–400)
RBC: 3.68 MIL/uL — ABNORMAL LOW (ref 4.22–5.81)
RDW: 17.8 % — ABNORMAL HIGH (ref 11.5–15.5)
WBC: 6.1 10*3/uL (ref 4.0–10.5)
nRBC: 0 % (ref 0.0–0.2)

## 2018-07-10 LAB — BASIC METABOLIC PANEL
Anion gap: 13 (ref 5–15)
BUN: 84 mg/dL — ABNORMAL HIGH (ref 8–23)
CO2: 20 mmol/L — ABNORMAL LOW (ref 22–32)
Calcium: 9 mg/dL (ref 8.9–10.3)
Chloride: 109 mmol/L (ref 98–111)
Creatinine, Ser: 12.44 mg/dL — ABNORMAL HIGH (ref 0.61–1.24)
GFR calc Af Amer: 4 mL/min — ABNORMAL LOW (ref >60)
GFR calc non Af Amer: 3 mL/min — ABNORMAL LOW (ref >60)
Glucose, Bld: 146 mg/dL — ABNORMAL HIGH (ref 70–99)
Potassium: 4.8 mmol/L (ref 3.5–5.1)
Sodium: 142 mmol/L (ref 135–145)

## 2018-07-10 NOTE — ED Provider Notes (Signed)
[facility] EMERGENCY DEPARTMENT Provider Note   CSN: [id number] Arrival date & time: 07/10/18  1749    History   Chief Complaint Chief Complaint  Patient presents with  . sent for dialysis by nursing home    HPI Miguel Tucker is a 85 y.o. male.     HPI Patient presents from [facility] by EMS.  He has no complaints at this time.  Has history of dementia.  Also has history of end-stage renal disease previously on hemodialysis.  Has not been dialyzed since 6/30 due to being placed on hospice care.  Son is now decided he wants the patient to receive dialysis.  Patient is now here to see if he needs emergent dialysis by having labs checked. Past Medical History:  Diagnosis Date  . Asthma   . Chronic kidney disease    on dialysis  . Chronic knee pain   . Dementia ([place])   . Dementia ([place])   . Diabetes mellitus    type 2  . ESRD (end stage renal disease) ([place])   . Frequent urination at night   . Gout   . Hard of hearing   . High cholesterol   . Hypertension   . Hypokalemia   . Radicular pain of left lower extremity     Patient Active Problem List   Diagnosis Date Noted  . Gait instability 01/12/2018  . Acute ischemic stroke ([place]) 12/13/2017  . Weakness of left upper extremity 12/12/2017  . Hypokalemia 12/12/2017  . ESRD (end stage renal disease) ([place]) 12/12/2017  . HLD (hyperlipidemia) 12/12/2017  . Gout 12/12/2017  . BPH (benign prostatic hyperplasia) 12/12/2017  . Asthma 12/12/2017  . GERD (gastroesophageal reflux disease) 12/12/2017  . Symptomatic anemia 11/25/2017  . Generalized weakness 09/06/2017  . HTN (hypertension) 09/06/2017  . Anemia, chronic disease 09/06/2017  . Dementia ([place]) 09/06/2017  . Onychomycosis 04/11/2017  . Hammer toe 04/11/2017  . Diabetes mellitus ([place]) 04/11/2017  . Osteoarthritis of knee 02/11/2017  . Difficulty in walking(719.7) 12/09/2011  . Radicular pain of left lower extremity 12/09/2011  . Wound healing, delayed  11/06/2010    Past Surgical History:  Procedure Laterality Date  . APPENDECTOMY    . AV FISTULA PLACEMENT Left 10/18/2017   Procedure: INSERTION OF ARTERIOVENOUS (AV) GORE-TEX GRAFT LEFT UPPER  ARM;  Surgeon: Waynetta Sandy, MD;  Location: [facility];  Service: Vascular;  Laterality: Left;  . BASCILIC VEIN TRANSPOSITION Left 09/09/2015   Procedure: FIRST STAGE BRACHIAL VEIN TRANSPOSITION LEFT ARM;  Surgeon: Elam Dutch, MD;  Location: [facility];  Service: Vascular;  Laterality: Left;  . BASCILIC VEIN TRANSPOSITION Left 02/20/2016   Procedure: LEFT ARM SECOND STAGE BASILIC VEIN TRANSPOSITION;  Surgeon: Rosetta Posner, MD;  Location: [facility];  Service: Vascular;  Laterality: Left;  . ESOPHAGOGASTRODUODENOSCOPY N/A 11/27/2017   Procedure: ESOPHAGOGASTRODUODENOSCOPY (EGD);  Surgeon: Rogene Houston, MD;  [facility];  Service: Endoscopy;  Laterality: N/A;  . INSERTION OF DIALYSIS CATHETER Right 10/18/2017   Procedure: INSERTION OF TUNNELED  DIALYSIS CATHETER RIGHT INTERNAL JUGULAR;  Surgeon: Waynetta Sandy, MD;  Location: [facility];  Service: Vascular;  Laterality: Right;  . OTHER SURGICAL HISTORY     testicular tumor- benign   . ROTATOR CUFF REPAIR Right   . WOUND DEBRIDEMENT  06/01/2011   Procedure: DEBRIDEMENT ABDOMINAL WOUND;  Surgeon: [name], MD;  [facility]: [facility];  Service: General;  Laterality: N/A;  Remove Sutures  Home Medications    Prior to Admission medications   Medication Sig Start Date End Date Taking? Authorizing Provider  allopurinol (ZYLOPRIM) 100 MG tablet Take 100 mg by mouth daily.  07/23/15   [provider]  amLODipine (NORVASC) 5 MG tablet Take 1 tablet (5 mg total) by mouth daily. 11/29/17   Murlean Iba, MD  aspirin EC 81 MG tablet Take 1 tablet (81 mg total) by mouth every morning. 12/03/17   Johnson, Clanford L, MD  atorvastatin (LIPITOR) 40 MG tablet TAKE 1 TABLET BY MOUTH ONCE DAILY AT [facility] 02/21/18   [name], MD  calcitRIOL (ROCALTROL) 0.25 MCG capsule Take 1 capsule (0.25 mcg total) by mouth every Monday, Wednesday, and Friday with hemodialysis. 12/16/17   Barton Dubois, MD  colchicine 0.6 MG tablet TAKE 1 TABLET BY MOUTH ONCE DAILY. 02/21/18   [name], MD  donepezil (ARICEPT) 5 MG tablet Take 2 tablets (10 mg total) by mouth at bedtime. 12/14/17   Barton Dubois, MD  ferrous sulfate 325 (65 FE) MG tablet Take 325 mg by mouth daily with breakfast.    [provider]  finasteride (PROSCAR) 5 MG tablet Take 5 mg by mouth daily.    [provider]  montelukast (SINGULAIR) 10 MG tablet Take 10 mg by mouth at bedtime.    [provider]  multivitamin (RENA-VIT) TABS tablet Take 1 tablet by mouth daily.    [provider]  sevelamer carbonate (RENVELA) 800 MG tablet Take 800 mg by mouth 3 (three) times daily with meals.    [provider]    Family History History reviewed. No pertinent family history.  Social History Social History   Tobacco Use  . Smoking status: Former Smoker    Quit date: 11/06/1975    Years since quitting: 42.7  . Smokeless tobacco: Never Used  Substance Use Topics  . Alcohol use: No  . Drug use: No     Allergies   Penicillins   Review of Systems Review of Systems  Unable to perform ROS: Dementia     Physical Exam Updated Vital Signs BP (!) 166/68 (BP Location: Right Arm)   Pulse 77   Temp 98.1 F (36.7 C) (Oral)   Resp 16   Ht 5\' 5"  (1.651 m)   Wt 70.8 kg   SpO2 98%   BMI 25.97 kg/m   Physical Exam Vitals signs and nursing note reviewed.  Constitutional:      General: He is not in acute distress.    Appearance: Normal appearance. He is well-developed. He is not ill-appearing.  HENT:     Head: Normocephalic and atraumatic.  Eyes:     Pupils: Pupils are equal, round, and reactive to light.  Neck:     Musculoskeletal: Normal range of motion and neck supple.  Cardiovascular:      Rate and Rhythm: Normal rate and regular rhythm.     Heart sounds: No murmur. No friction rub. No gallop.   Pulmonary:     Effort: Pulmonary effort is normal. No respiratory distress.     Breath sounds: Normal breath sounds. No stridor. No wheezing, rhonchi or rales.  Chest:     Chest wall: No tenderness.  Abdominal:     General: Bowel sounds are normal.     Palpations: Abdomen is soft.     Tenderness: There is no abdominal tenderness. There is no guarding or rebound.  Musculoskeletal: Normal range of motion.  General: No tenderness.  Skin:    General: Skin is warm and dry.     Findings: No erythema or rash.  Neurological:     Mental Status: He is alert.     Comments: Mildly confused.  Follows commands.  5/5 motor in all extremities.  Sensation intact.  Psychiatric:        Behavior: Behavior normal.      ED Treatments / Results  Labs (all labs ordered are listed, but only abnormal results are displayed) Labs Reviewed  BASIC METABOLIC PANEL - Abnormal; Notable for the following components:      Result Value   CO2 20 (*)    Glucose, Bld 146 (*)    BUN 84 (*)    Creatinine, Ser 12.44 (*)    GFR calc non Af Amer 3 (*)    GFR calc Af Amer 4 (*)    All other components within normal limits  CBC - Abnormal; Notable for the following components:   RBC 3.68 (*)    Hemoglobin 11.0 (*)    HCT 35.1 (*)    RDW 17.8 (*)    Platelets 135 (*)    All other components within normal limits    EKG None  Radiology No results found.  Procedures Procedures (including critical care time)  Medications Ordered in ED Medications - No data to display   Initial Impression / Assessment and Plan / ED Course  I have reviewed the triage vital signs and the nursing notes.  Pertinent labs & imaging results that were available during my care of the patient were reviewed by me and considered in my medical decision making (see chart for details).        No evidence of need for  emergent dialysis.  Advised to continue through outpatient clinic.  Final Clinical Impressions(s) / ED Diagnoses   Final diagnoses:  ESRD (end stage renal disease) [facility])    ED Discharge Orders    None       Julianne Rice, MD 07/10/18 2038

## 2018-07-10 NOTE — ED Triage Notes (Signed)
Pt brought in by ems from [facility] for evaluation of need for dialysis. Pt has not been to dialysis since 6/29 due to election of hospice care. Son has now decided he wants to resume full care. Pt has no symptoms and has dementia.

## 2018-07-10 NOTE — ED Triage Notes (Signed)
Brought in by [facility] for c/o need for dialysis; pt states he has not had dialysis since June 30 due to his son placing pt on hospice care; son did not realize pt would no receive dialysis under hospice care; pt has no complaints at this time

## 2018-07-10 NOTE — Discharge Instructions (Signed)
Continue dialysis through your outpatient provider.

## 2018-07-12 DIAGNOSIS — D689 Coagulation defect, unspecified: Secondary | ICD-10-CM | POA: Diagnosis not present

## 2018-07-12 DIAGNOSIS — E039 Hypothyroidism, unspecified: Secondary | ICD-10-CM | POA: Diagnosis not present

## 2018-07-12 DIAGNOSIS — N2581 Secondary hyperparathyroidism of renal origin: Secondary | ICD-10-CM | POA: Diagnosis not present

## 2018-07-12 DIAGNOSIS — D631 Anemia in chronic kidney disease: Secondary | ICD-10-CM | POA: Diagnosis not present

## 2018-07-12 DIAGNOSIS — D509 Iron deficiency anemia, unspecified: Secondary | ICD-10-CM | POA: Diagnosis not present

## 2018-07-12 DIAGNOSIS — N186 End stage renal disease: Secondary | ICD-10-CM | POA: Diagnosis not present

## 2018-07-12 DIAGNOSIS — Z23 Encounter for immunization: Secondary | ICD-10-CM | POA: Diagnosis not present

## 2018-07-21 DIAGNOSIS — Z20828 Contact with and (suspected) exposure to other viral communicable diseases: Secondary | ICD-10-CM | POA: Diagnosis not present

## 2018-07-21 DIAGNOSIS — Z1383 Encounter for screening for respiratory disorder NEC: Secondary | ICD-10-CM | POA: Diagnosis not present

## 2018-07-25 DIAGNOSIS — E119 Type 2 diabetes mellitus without complications: Secondary | ICD-10-CM | POA: Diagnosis not present

## 2018-07-25 DIAGNOSIS — H2513 Age-related nuclear cataract, bilateral: Secondary | ICD-10-CM | POA: Diagnosis not present

## 2018-08-02 DIAGNOSIS — I10 Essential (primary) hypertension: Secondary | ICD-10-CM | POA: Diagnosis not present

## 2018-08-02 DIAGNOSIS — Z992 Dependence on renal dialysis: Secondary | ICD-10-CM | POA: Diagnosis not present

## 2018-08-02 DIAGNOSIS — I639 Cerebral infarction, unspecified: Secondary | ICD-10-CM | POA: Diagnosis not present

## 2018-08-02 DIAGNOSIS — R69 Illness, unspecified: Secondary | ICD-10-CM | POA: Diagnosis not present

## 2018-08-02 DIAGNOSIS — N186 End stage renal disease: Secondary | ICD-10-CM | POA: Diagnosis not present

## 2018-08-02 DIAGNOSIS — E1129 Type 2 diabetes mellitus with other diabetic kidney complication: Secondary | ICD-10-CM | POA: Diagnosis not present

## 2018-08-04 DIAGNOSIS — N186 End stage renal disease: Secondary | ICD-10-CM | POA: Diagnosis not present

## 2018-08-04 DIAGNOSIS — I129 Hypertensive chronic kidney disease with stage 1 through stage 4 chronic kidney disease, or unspecified chronic kidney disease: Secondary | ICD-10-CM | POA: Diagnosis not present

## 2018-08-04 DIAGNOSIS — Z992 Dependence on renal dialysis: Secondary | ICD-10-CM | POA: Diagnosis not present

## 2018-08-09 ENCOUNTER — Telehealth: Payer: Self-pay | Admitting: Neurology

## 2018-08-09 DIAGNOSIS — D689 Coagulation defect, unspecified: Secondary | ICD-10-CM | POA: Diagnosis not present

## 2018-08-09 DIAGNOSIS — N2581 Secondary hyperparathyroidism of renal origin: Secondary | ICD-10-CM | POA: Diagnosis not present

## 2018-08-09 DIAGNOSIS — D509 Iron deficiency anemia, unspecified: Secondary | ICD-10-CM | POA: Diagnosis not present

## 2018-08-09 DIAGNOSIS — N186 End stage renal disease: Secondary | ICD-10-CM | POA: Diagnosis not present

## 2018-08-09 DIAGNOSIS — D631 Anemia in chronic kidney disease: Secondary | ICD-10-CM | POA: Diagnosis not present

## 2018-08-09 DIAGNOSIS — R52 Pain, unspecified: Secondary | ICD-10-CM | POA: Diagnosis not present

## 2018-08-09 DIAGNOSIS — Z992 Dependence on renal dialysis: Secondary | ICD-10-CM | POA: Diagnosis not present

## 2018-08-09 NOTE — Telephone Encounter (Signed)
Arbie Cookey from [facility] is calling in wanting to know if patients appt on 08/22/18 can be converted to a Virtual Visit due to not letting patients out of facility  CB# [phone]

## 2018-08-09 NOTE — Telephone Encounter (Signed)
[place] advised of change , supervisor will call to receive link for Virtual Visit

## 2018-08-11 DIAGNOSIS — Z1383 Encounter for screening for respiratory disorder NEC: Secondary | ICD-10-CM | POA: Diagnosis not present

## 2018-08-11 DIAGNOSIS — Z20828 Contact with and (suspected) exposure to other viral communicable diseases: Secondary | ICD-10-CM | POA: Diagnosis not present

## 2018-08-16 ENCOUNTER — Other Ambulatory Visit: Payer: Self-pay

## 2018-08-16 ENCOUNTER — Emergency Department (HOSPITAL_COMMUNITY)
Admission: EM | Admit: 2018-08-16 | Discharge: 2018-08-16 | Disposition: A | Discharge status: Home / Self Care | Payer: Medicare HMO | Attending: Emergency Medicine | Admitting: Emergency Medicine

## 2018-08-16 ENCOUNTER — Encounter (HOSPITAL_COMMUNITY): Payer: Self-pay | Admitting: *Deleted

## 2018-08-16 DIAGNOSIS — Z7982 Long term (current) use of aspirin: Secondary | ICD-10-CM | POA: Diagnosis not present

## 2018-08-16 DIAGNOSIS — Z992 Dependence on renal dialysis: Secondary | ICD-10-CM | POA: Diagnosis not present

## 2018-08-16 DIAGNOSIS — J45909 Unspecified asthma, uncomplicated: Secondary | ICD-10-CM | POA: Insufficient documentation

## 2018-08-16 DIAGNOSIS — G8929 Other chronic pain: Secondary | ICD-10-CM

## 2018-08-16 DIAGNOSIS — R509 Fever, unspecified: Secondary | ICD-10-CM | POA: Diagnosis not present

## 2018-08-16 DIAGNOSIS — N186 End stage renal disease: Secondary | ICD-10-CM | POA: Insufficient documentation

## 2018-08-16 DIAGNOSIS — Z7401 Bed confinement status: Secondary | ICD-10-CM | POA: Diagnosis not present

## 2018-08-16 DIAGNOSIS — I120 Hypertensive chronic kidney disease with stage 5 chronic kidney disease or end stage renal disease: Secondary | ICD-10-CM | POA: Insufficient documentation

## 2018-08-16 DIAGNOSIS — Z79899 Other long term (current) drug therapy: Secondary | ICD-10-CM | POA: Insufficient documentation

## 2018-08-16 DIAGNOSIS — Z87891 Personal history of nicotine dependence: Secondary | ICD-10-CM | POA: Diagnosis not present

## 2018-08-16 DIAGNOSIS — R404 Transient alteration of awareness: Secondary | ICD-10-CM | POA: Diagnosis not present

## 2018-08-16 DIAGNOSIS — U071 COVID-19: Secondary | ICD-10-CM | POA: Diagnosis not present

## 2018-08-16 DIAGNOSIS — M79674 Pain in right toe(s): Secondary | ICD-10-CM | POA: Insufficient documentation

## 2018-08-16 DIAGNOSIS — Z209 Contact with and (suspected) exposure to unspecified communicable disease: Secondary | ICD-10-CM | POA: Diagnosis not present

## 2018-08-16 DIAGNOSIS — E119 Type 2 diabetes mellitus without complications: Secondary | ICD-10-CM | POA: Diagnosis not present

## 2018-08-16 DIAGNOSIS — R5381 Other malaise: Secondary | ICD-10-CM | POA: Diagnosis not present

## 2018-08-16 NOTE — Discharge Instructions (Addendum)
Follow up with a podiatrist for your toe nails

## 2018-08-16 NOTE — ED Triage Notes (Signed)
Patient brought in via [facility], while patient was at dialysis.  Dialysis center reported a fever of 100.4., dialysis had not begun.  Patient is from [facility].  Patient is afebrile upon arrival to [facility].  Patient is alert with no noted distress.  Vital signs WDL.

## 2018-08-17 DIAGNOSIS — Z1383 Encounter for screening for respiratory disorder NEC: Secondary | ICD-10-CM | POA: Diagnosis not present

## 2018-08-17 DIAGNOSIS — Z20828 Contact with and (suspected) exposure to other viral communicable diseases: Secondary | ICD-10-CM | POA: Diagnosis not present

## 2018-08-18 DIAGNOSIS — Z79899 Other long term (current) drug therapy: Secondary | ICD-10-CM | POA: Diagnosis not present

## 2018-08-18 DIAGNOSIS — U071 COVID-19: Secondary | ICD-10-CM | POA: Diagnosis not present

## 2018-08-18 DIAGNOSIS — Z20828 Contact with and (suspected) exposure to other viral communicable diseases: Secondary | ICD-10-CM | POA: Diagnosis not present

## 2018-08-18 DIAGNOSIS — R509 Fever, unspecified: Secondary | ICD-10-CM | POA: Diagnosis not present

## 2018-08-18 DIAGNOSIS — N189 Chronic kidney disease, unspecified: Secondary | ICD-10-CM | POA: Diagnosis not present

## 2018-08-19 ENCOUNTER — Encounter (HOSPITAL_COMMUNITY): Payer: Self-pay

## 2018-08-19 ENCOUNTER — Emergency Department (HOSPITAL_COMMUNITY)
Admission: EM | Admit: 2018-08-19 | Discharge: 2018-08-20 | Disposition: A | Discharge status: Home / Self Care | Payer: Medicare HMO | Attending: Emergency Medicine | Admitting: Emergency Medicine

## 2018-08-19 ENCOUNTER — Emergency Department (HOSPITAL_COMMUNITY): Payer: Medicare HMO

## 2018-08-19 ENCOUNTER — Other Ambulatory Visit: Payer: Self-pay

## 2018-08-19 DIAGNOSIS — E785 Hyperlipidemia, unspecified: Secondary | ICD-10-CM | POA: Diagnosis not present

## 2018-08-19 DIAGNOSIS — J45909 Unspecified asthma, uncomplicated: Secondary | ICD-10-CM | POA: Insufficient documentation

## 2018-08-19 DIAGNOSIS — E1122 Type 2 diabetes mellitus with diabetic chronic kidney disease: Secondary | ICD-10-CM | POA: Diagnosis not present

## 2018-08-19 DIAGNOSIS — N186 End stage renal disease: Secondary | ICD-10-CM

## 2018-08-19 DIAGNOSIS — R509 Fever, unspecified: Secondary | ICD-10-CM | POA: Diagnosis not present

## 2018-08-19 DIAGNOSIS — Z992 Dependence on renal dialysis: Secondary | ICD-10-CM | POA: Diagnosis not present

## 2018-08-19 DIAGNOSIS — Z79899 Other long term (current) drug therapy: Secondary | ICD-10-CM | POA: Diagnosis not present

## 2018-08-19 DIAGNOSIS — Z87891 Personal history of nicotine dependence: Secondary | ICD-10-CM | POA: Insufficient documentation

## 2018-08-19 DIAGNOSIS — Z880 Allergy status to penicillin: Secondary | ICD-10-CM | POA: Insufficient documentation

## 2018-08-19 DIAGNOSIS — Z7984 Long term (current) use of oral hypoglycemic drugs: Secondary | ICD-10-CM | POA: Insufficient documentation

## 2018-08-19 DIAGNOSIS — U071 COVID-19: Secondary | ICD-10-CM

## 2018-08-19 DIAGNOSIS — I120 Hypertensive chronic kidney disease with stage 5 chronic kidney disease or end stage renal disease: Secondary | ICD-10-CM | POA: Diagnosis not present

## 2018-08-19 DIAGNOSIS — F0390 Unspecified dementia without behavioral disturbance: Secondary | ICD-10-CM | POA: Insufficient documentation

## 2018-08-19 DIAGNOSIS — R69 Illness, unspecified: Secondary | ICD-10-CM | POA: Diagnosis not present

## 2018-08-19 DIAGNOSIS — R0602 Shortness of breath: Secondary | ICD-10-CM | POA: Diagnosis not present

## 2018-08-19 LAB — BASIC METABOLIC PANEL
Anion gap: 17 — ABNORMAL HIGH (ref 5–15)
BUN: 90 mg/dL — ABNORMAL HIGH (ref 8–23)
CO2: 18 mmol/L — ABNORMAL LOW (ref 22–32)
Calcium: 8.6 mg/dL — ABNORMAL LOW (ref 8.9–10.3)
Chloride: 103 mmol/L (ref 98–111)
Creatinine, Ser: 13.22 mg/dL — ABNORMAL HIGH (ref 0.61–1.24)
GFR calc Af Amer: 3 mL/min — ABNORMAL LOW (ref >60)
GFR calc non Af Amer: 3 mL/min — ABNORMAL LOW (ref >60)
Glucose, Bld: 94 mg/dL (ref 70–99)
Potassium: 4.4 mmol/L (ref 3.5–5.1)
Sodium: 138 mmol/L (ref 135–145)

## 2018-08-19 LAB — CBC WITH DIFFERENTIAL/PLATELET
Abs Immature Granulocytes: 0.03 10*3/uL (ref 0.00–0.07)
Basophils Absolute: 0 10*3/uL (ref 0.0–0.1)
Basophils Relative: 0 %
Eosinophils Absolute: 0 10*3/uL (ref 0.0–0.5)
Eosinophils Relative: 0 %
HCT: 37.4 % — ABNORMAL LOW (ref 39.0–52.0)
Hemoglobin: 12.1 g/dL — ABNORMAL LOW (ref 13.0–17.0)
Immature Granulocytes: 1 %
Lymphocytes Relative: 24 %
Lymphs Abs: 1.1 10*3/uL (ref 0.7–4.0)
MCH: 28.9 pg (ref 26.0–34.0)
MCHC: 32.4 g/dL (ref 30.0–36.0)
MCV: 89.5 fL (ref 80.0–100.0)
Monocytes Absolute: 0.6 10*3/uL (ref 0.1–1.0)
Monocytes Relative: 13 %
Neutro Abs: 2.9 10*3/uL (ref 1.7–7.7)
Neutrophils Relative %: 62 %
Platelets: 125 10*3/uL — ABNORMAL LOW (ref 150–400)
RBC: 4.18 MIL/uL — ABNORMAL LOW (ref 4.22–5.81)
RDW: 18.7 % — ABNORMAL HIGH (ref 11.5–15.5)
WBC: 4.7 10*3/uL (ref 4.0–10.5)
nRBC: 0 % (ref 0.0–0.2)

## 2018-08-19 LAB — URINALYSIS, ROUTINE W REFLEX MICROSCOPIC
Bacteria, UA: NONE SEEN
Bilirubin Urine: NEGATIVE
Glucose, UA: NEGATIVE mg/dL
Ketones, ur: 5 mg/dL — AB
Leukocytes,Ua: NEGATIVE
Nitrite: NEGATIVE
Protein, ur: 100 mg/dL — AB
Specific Gravity, Urine: 1.013 (ref 1.005–1.030)
pH: 7 (ref 5.0–8.0)

## 2018-08-19 LAB — I-STAT CHEM 8, ED
BUN: 88 mg/dL — ABNORMAL HIGH (ref 8–23)
Calcium, Ion: 1.08 mmol/L — ABNORMAL LOW (ref 1.15–1.40)
Chloride: 106 mmol/L (ref 98–111)
Creatinine, Ser: 13.9 mg/dL — ABNORMAL HIGH (ref 0.61–1.24)
Glucose, Bld: 89 mg/dL (ref 70–99)
HCT: 36 % — ABNORMAL LOW (ref 39.0–52.0)
Hemoglobin: 12.2 g/dL — ABNORMAL LOW (ref 13.0–17.0)
Potassium: 4.1 mmol/L (ref 3.5–5.1)
Sodium: 138 mmol/L (ref 135–145)
TCO2: 21 mmol/L — ABNORMAL LOW (ref 22–32)

## 2018-08-19 LAB — SARS CORONAVIRUS 2 BY RT PCR (HOSPITAL ORDER, PERFORMED IN ~~LOC~~ HOSPITAL LAB): SARS Coronavirus 2: POSITIVE — AB

## 2018-08-19 MED ORDER — LORAZEPAM 2 MG/ML IJ SOLN
1.0000 mg | Freq: Once | INTRAMUSCULAR | Status: AC
  Administered 2018-08-19: 23:00:00 1 mg via INTRAVENOUS
  Filled 2018-08-19: qty 1

## 2018-08-19 NOTE — ED Provider Notes (Signed)
[facility] EMERGENCY DEPARTMENT Provider Note   CSN: [id number] Arrival date & time: 08/19/18  2100    History   Chief Complaint Chief Complaint  Patient presents with  . Vascular Access Problem    HPI Miguel Tucker is a 85 y.o. male hx of CKD, dementia, hypertension, ESRD on HD (last dialysis was 6 days ago), here presenting with dialysis issue.  Patient is from a nursing home and apparently tested positive for COVID on Wednesday (8/12).  He has not been dialyzed for the last 2 sessions.  Nursing home was concerned that he needs dialysis today.  Patient has no symptoms and patient is very confused which is baseline.  I reviewed nursing home chart, it is unclear if he actually tested positive for COVID at that time.      The history is provided by the EMS personnel and the nursing home.    Past Medical History:  Diagnosis Date  . Asthma   . Chronic kidney disease    on dialysis  . Chronic knee pain   . Dementia ([place])   . Dementia ([place])   . Diabetes mellitus    type 2  . ESRD (end stage renal disease) ([place])   . Frequent urination at night   . Gout   . Hard of hearing   . High cholesterol   . Hypertension   . Hypokalemia   . Radicular pain of left lower extremity     Patient Active Problem List   Diagnosis Date Noted  . Gait instability 01/12/2018  . Acute ischemic stroke ([place]) 12/13/2017  . Weakness of left upper extremity 12/12/2017  . Hypokalemia 12/12/2017  . ESRD (end stage renal disease) ([place]) 12/12/2017  . HLD (hyperlipidemia) 12/12/2017  . Gout 12/12/2017  . BPH (benign prostatic hyperplasia) 12/12/2017  . Asthma 12/12/2017  . GERD (gastroesophageal reflux disease) 12/12/2017  . Symptomatic anemia 11/25/2017  . Generalized weakness 09/06/2017  . HTN (hypertension) 09/06/2017  . Anemia, chronic disease 09/06/2017  . Dementia ([place]) 09/06/2017  . Onychomycosis 04/11/2017  . Hammer toe 04/11/2017  . Diabetes mellitus ([place])  04/11/2017  . Osteoarthritis of knee 02/11/2017  . Difficulty in walking(719.7) 12/09/2011  . Radicular pain of left lower extremity 12/09/2011  . Wound healing, delayed 11/06/2010    Past Surgical History:  Procedure Laterality Date  . APPENDECTOMY    . AV FISTULA PLACEMENT Left 10/18/2017   Procedure: INSERTION OF ARTERIOVENOUS (AV) GORE-TEX GRAFT LEFT UPPER  ARM;  Surgeon: Waynetta Sandy, MD;  Location: [facility];  Service: Vascular;  Laterality: Left;  . BASCILIC VEIN TRANSPOSITION Left 09/09/2015   Procedure: FIRST STAGE BRACHIAL VEIN TRANSPOSITION LEFT ARM;  Surgeon: Elam Dutch, MD;  Location: [facility];  Service: Vascular;  Laterality: Left;  . BASCILIC VEIN TRANSPOSITION Left 02/20/2016   Procedure: LEFT ARM SECOND STAGE BASILIC VEIN TRANSPOSITION;  Surgeon: Rosetta Posner, MD;  Location: [facility];  Service: Vascular;  Laterality: Left;  . ESOPHAGOGASTRODUODENOSCOPY N/A 11/27/2017   Procedure: ESOPHAGOGASTRODUODENOSCOPY (EGD);  Surgeon: Rogene Houston, MD;  [facility];  Service: Endoscopy;  Laterality: N/A;  . INSERTION OF DIALYSIS CATHETER Right 10/18/2017   Procedure: INSERTION OF TUNNELED  DIALYSIS CATHETER RIGHT INTERNAL JUGULAR;  Surgeon: Waynetta Sandy, MD;  Location: [place];  Service: Vascular;  Laterality: Right;  . OTHER SURGICAL HISTORY     testicular tumor- benign   . ROTATOR CUFF REPAIR Right   . WOUND DEBRIDEMENT  06/01/2011  Procedure: DEBRIDEMENT ABDOMINAL WOUND;  Surgeon: [name], MD;  [facility]: [facility];  Service: General;  Laterality: N/A;  Remove Sutures         Home Medications    Prior to Admission medications   Medication Sig Start Date End Date Taking? Authorizing Provider  amLODipine (NORVASC) 10 MG tablet Take 10 mg by mouth daily.   Yes [provider]  apixaban (ELIQUIS) 2.5 MG TABS tablet Take 5 mg by mouth once. 08/19/18 08/19/18 Yes [provider]  aspirin EC 81 MG tablet Take 1 tablet (81 mg  total) by mouth every morning. 12/03/17  Yes Johnson, Clanford L, MD  atorvastatin (LIPITOR) 40 MG tablet TAKE 1 TABLET BY MOUTH ONCE DAILY AT 6PM Patient taking differently: Take 40 mg by mouth at bedtime.  02/21/18  Yes [place], Modena Nunnery, MD  colchicine 0.6 MG tablet TAKE 1 TABLET BY MOUTH ONCE DAILY. Patient taking differently: Take 0.3 mg by mouth daily.  02/21/18  Yes [place], Modena Nunnery, MD  donepezil (ARICEPT) 10 MG tablet Take 10 mg by mouth at bedtime.   Yes [provider]  finasteride (PROSCAR) 5 MG tablet Take 5 mg by mouth daily.   Yes [provider]  loratadine (CLARITIN) 10 MG tablet Take 10 mg by mouth daily.   Yes [provider]  LORazepam (ATIVAN) 0.5 MG tablet Take 0.5 mg by mouth See admin instructions. Take 0.5 mg by mouth in the morning on Mon/Wed/Fri for anxiety- prior to dialysis   Yes [provider]  multivitamin (RENA-VIT) TABS tablet Take 1 tablet by mouth at bedtime.   Yes [provider]  sevelamer (RENAGEL) 800 MG tablet Take 800 mg by mouth 3 (three) times daily.   Yes [provider]  allopurinol (ZYLOPRIM) 100 MG tablet Take 100 mg by mouth daily.  07/23/15   [provider]  amLODipine (NORVASC) 5 MG tablet Take 1 tablet (5 mg total) by mouth daily. Patient not taking: Reported on 08/19/2018 11/29/17   Murlean Iba, MD  calcitRIOL (ROCALTROL) 0.25 MCG capsule Take 1 capsule (0.25 mcg total) by mouth every Monday, Wednesday, and Friday with hemodialysis. Patient not taking: Reported on 08/19/2018 12/16/17   Barton Dubois, MD  donepezil (ARICEPT) 5 MG tablet Take 2 tablets (10 mg total) by mouth at bedtime. Patient not taking: Reported on 08/19/2018 12/14/17   Barton Dubois, MD  enoxaparin (LOVENOX) 30 MG/0.3ML injection Inject 30 mg into the skin See admin instructions. Inject 30 mg into the skin every morning at 9 AM to prevent blood clotting (for 21 days) 08/20/18 09/09/18  [provider]   ferrous sulfate 325 (65 FE) MG tablet Take 325 mg by mouth daily with breakfast.    [provider]  montelukast (SINGULAIR) 10 MG tablet Take 10 mg by mouth at bedtime.    [provider]    Family History History reviewed. No pertinent family history.  Social History Social History   Tobacco Use  . Smoking status: Former Smoker    Quit date: 11/06/1975    Years since quitting: 42.8  . Smokeless tobacco: Never Used  Substance Use Topics  . Alcohol use: No  . Drug use: No     Allergies   Penicillins   Review of Systems Review of Systems  Unable to perform ROS: Dementia  All other systems reviewed and are negative.    Physical Exam Updated Vital Signs BP (!) 147/94   Pulse (!) 101   Temp  99.5 F (37.5 C) (Oral)   Resp 19   SpO2 99%   Physical Exam Vitals signs and nursing note reviewed.  Constitutional:      Comments: Demented, NAD   HENT:     Head: Normocephalic.     Nose: Nose normal.     Mouth/Throat:     Mouth: Mucous membranes are moist.  Eyes:     Extraocular Movements: Extraocular movements intact.     Pupils: Pupils are equal, round, and reactive to light.  Neck:     Musculoskeletal: Normal range of motion.  Cardiovascular:     Rate and Rhythm: Normal rate and regular rhythm.     Pulses: Normal pulses.  Pulmonary:     Effort: Pulmonary effort is normal.     Comments: Diminished bilaterally  Abdominal:     General: Abdomen is flat.     Palpations: Abdomen is soft.  Musculoskeletal: Normal range of motion.  Skin:    General: Skin is warm.     Capillary Refill: Capillary refill takes less than 2 seconds.  Neurological:     General: No focal deficit present.     Mental Status: He is alert.  Psychiatric:        Mood and Affect: Mood normal.      ED Treatments / Results  Labs (all labs ordered are listed, but only abnormal results are displayed) Labs Reviewed  CBC WITH DIFFERENTIAL/PLATELET - Abnormal; Notable for the  following components:      Result Value   RBC 4.18 (*)    Hemoglobin 12.1 (*)    HCT 37.4 (*)    RDW 18.7 (*)    Platelets 125 (*)    All other components within normal limits  BASIC METABOLIC PANEL - Abnormal; Notable for the following components:   CO2 18 (*)    BUN 90 (*)    Creatinine, Ser 13.22 (*)    Calcium 8.6 (*)    GFR calc non Af Amer 3 (*)    GFR calc Af Amer 3 (*)    Anion gap 17 (*)    All other components within normal limits  URINALYSIS, ROUTINE W REFLEX MICROSCOPIC - Abnormal; Notable for the following components:   Hgb urine dipstick SMALL (*)    Ketones, ur 5 (*)    Protein, ur 100 (*)    All other components within normal limits  I-STAT CHEM 8, ED - Abnormal; Notable for the following components:   BUN 88 (*)    Creatinine, Ser 13.90 (*)    Calcium, Ion 1.08 (*)    TCO2 21 (*)    Hemoglobin 12.2 (*)    HCT 36.0 (*)    All other components within normal limits  SARS CORONAVIRUS 2 (HOSPITAL ORDER, [facility])  URINE CULTURE    EKG EKG Interpretation  Date/Time:  Saturday August 19 2018 21:12:53 EDT Ventricular Rate:  98 PR Interval:    QRS Duration: 90 QT Interval:  346 QTC Calculation: 442 R Axis:   -61 Text Interpretation:  Sinus rhythm Ventricular premature complex Left anterior fascicular block Low voltage, precordial leads Abnormal R-wave progression, early transition Left ventricular hypertrophy Anterior Q waves, possibly due to LVH ST elevation, consider inferior injury No significant change since last tracing Confirmed by Wandra Arthurs [id number]) on 08/19/2018 9:18:24 PM   Radiology Dg Chest Port 1 View  Result Date: 08/19/2018 CLINICAL DATA:  Shortness of breath. Fever. Staff at living facility report patient  is COVID-19 positive. EXAM: PORTABLE CHEST 1 VIEW COMPARISON:  Chest radiograph 12109 FINDINGS: Previous dialysis catheter has been removed. Cardiomegaly with tortuous thoracic aorta, similar to prior exam. No  pulmonary edema. Slight ill-defined peripheral midlung opacities. No large pleural effusion or pneumothorax. No acute osseous abnormalities. IMPRESSION: 1. Ill-defined peripheral midlung opacities likely secondary to atypical viral infection. 2. Cardiomegaly with tortuous thoracic aorta, similar to prior exam. Electronically Signed   By: Keith Rake [name]   On: 08/19/2018 21:48    Procedures Procedures (including critical care time)  Medications Ordered in ED Medications  LORazepam (ATIVAN) injection 1 mg (has no administration in time range)     Initial Impression / Assessment and Plan / ED Course  I have reviewed the triage vital signs and the nursing notes.  Pertinent labs & imaging results that were available during my care of the patient were reviewed by me and considered in my medical decision making (see chart for details).       DUAIN PATHAK is a 85 y.o. male here with SOB.  Patient is a dialysis patient and was positive for COVID at the nursing home. Will get labs and CXR. If patient is hyperkalemic or have significant electrolyte abnormalities, will need admission. Otherwise, will consult nephrology regarding dialysis for COVID positive patients.   11:17 PM Patient's potassium is normal, CXR suggestive of viral infection. UA normal. I talked to Dr. Grayland Ormond from nephrology. She states that her PA will call tomorrow to set up dialysis at [facility] on Monday. Will retest for COVID. Signed out to Dr. Christy Gentles in the ED.    Final Clinical Impressions(s) / ED Diagnoses   Final diagnoses:  None    ED Discharge Orders    None       Drenda Freeze, MD 08/19/18 2317

## 2018-08-19 NOTE — ED Provider Notes (Signed)
I assumed care at signout to follow-up on COVID-19 testing patient is positive for COVID-19.  No other acute issues at this time.  No hypoxia. Does not require emergent dialysis.  He is safe for discharge. Arrangements have been made for patient to go to alternate nursing home in [facility].   Ripley Fraise, MD 08/19/18 [phone]

## 2018-08-19 NOTE — ED Triage Notes (Signed)
Pt arriving via [facility] from [facility]. Pt is a mon, weds, fri dialysis pt. Per EMS pt had a fever on weds and [facility] staff states pt is covid pos. Due to pts covid status, he has not been able to receive his dialysis on weds so he comes to [facility] for dialysis. EMS VSS, temp 97.7 F.

## 2018-08-20 DIAGNOSIS — R2689 Other abnormalities of gait and mobility: Secondary | ICD-10-CM | POA: Diagnosis not present

## 2018-08-20 DIAGNOSIS — R41841 Cognitive communication deficit: Secondary | ICD-10-CM | POA: Diagnosis not present

## 2018-08-20 DIAGNOSIS — R69 Illness, unspecified: Secondary | ICD-10-CM | POA: Diagnosis not present

## 2018-08-20 DIAGNOSIS — R5381 Other malaise: Secondary | ICD-10-CM | POA: Diagnosis not present

## 2018-08-20 DIAGNOSIS — M6281 Muscle weakness (generalized): Secondary | ICD-10-CM | POA: Diagnosis not present

## 2018-08-20 DIAGNOSIS — R2681 Unsteadiness on feet: Secondary | ICD-10-CM | POA: Diagnosis not present

## 2018-08-20 DIAGNOSIS — R278 Other lack of coordination: Secondary | ICD-10-CM | POA: Diagnosis not present

## 2018-08-20 DIAGNOSIS — Z741 Need for assistance with personal care: Secondary | ICD-10-CM | POA: Diagnosis not present

## 2018-08-20 DIAGNOSIS — R1319 Other dysphagia: Secondary | ICD-10-CM | POA: Diagnosis not present

## 2018-08-20 DIAGNOSIS — U071 COVID-19: Secondary | ICD-10-CM | POA: Diagnosis not present

## 2018-08-20 DIAGNOSIS — Z743 Need for continuous supervision: Secondary | ICD-10-CM | POA: Diagnosis not present

## 2018-08-20 DIAGNOSIS — R279 Unspecified lack of coordination: Secondary | ICD-10-CM | POA: Diagnosis not present

## 2018-08-20 NOTE — ED Notes (Signed)
Patient verbalizes understanding of discharge instructions. Opportunity for questioning and answers were provided. Armband removed by staff, pt discharged from ED via [place].

## 2018-08-21 DIAGNOSIS — E785 Hyperlipidemia, unspecified: Secondary | ICD-10-CM | POA: Diagnosis not present

## 2018-08-21 DIAGNOSIS — M2550 Pain in unspecified joint: Secondary | ICD-10-CM | POA: Diagnosis not present

## 2018-08-21 DIAGNOSIS — R5381 Other malaise: Secondary | ICD-10-CM | POA: Diagnosis not present

## 2018-08-21 DIAGNOSIS — D631 Anemia in chronic kidney disease: Secondary | ICD-10-CM | POA: Diagnosis not present

## 2018-08-21 DIAGNOSIS — N186 End stage renal disease: Secondary | ICD-10-CM | POA: Diagnosis not present

## 2018-08-21 DIAGNOSIS — M109 Gout, unspecified: Secondary | ICD-10-CM | POA: Diagnosis not present

## 2018-08-21 DIAGNOSIS — Z992 Dependence on renal dialysis: Secondary | ICD-10-CM | POA: Diagnosis not present

## 2018-08-21 DIAGNOSIS — U071 COVID-19: Secondary | ICD-10-CM | POA: Diagnosis not present

## 2018-08-21 DIAGNOSIS — Z7401 Bed confinement status: Secondary | ICD-10-CM | POA: Diagnosis not present

## 2018-08-21 DIAGNOSIS — I10 Essential (primary) hypertension: Secondary | ICD-10-CM | POA: Diagnosis not present

## 2018-08-21 DIAGNOSIS — G309 Alzheimer's disease, unspecified: Secondary | ICD-10-CM | POA: Diagnosis not present

## 2018-08-21 DIAGNOSIS — E1129 Type 2 diabetes mellitus with other diabetic kidney complication: Secondary | ICD-10-CM | POA: Diagnosis not present

## 2018-08-21 DIAGNOSIS — R69 Illness, unspecified: Secondary | ICD-10-CM | POA: Diagnosis not present

## 2018-08-21 LAB — URINE CULTURE: Culture: >=100000 — AB

## 2018-08-21 NOTE — Progress Notes (Deleted)
    Virtual Visit via Video Note  I connected with Miguel Tucker on 08/21/18 at  1:45 PM EDT by a video enabled telemedicine application and verified that I am speaking with the correct person using two identifiers.  Location: Patient: *** Provider: ***   I discussed the limitations of evaluation and management by telemedicine and the availability of in person appointments. The patient expressed understanding and agreed to proceed.  History of Present Illness: 11/02/2018 MM: Miguel Tucker is an 85 year old male with a history of memory disturbance.  He returns today for follow-up.  He just recently started hemodialysis.  He is here today with his wife and son.  He does have a been any change in his memory.  His son reports that his short-term memory has diminished and reports that he has no long-term memory.  He lives at home with his wife.  He is able to complete all ADLs independently but does require prompting to reports good appetite.  Denies any trouble sleeping.  Denies any changes in mood or behavior.  Denies hallucinations.  He does not operate a motor vehicle. the son reports that his mother has also been diagnosed with dementia.  Their primary care has recommended that they should not live independently.  He returns today for evaluation.   Observations/Objective:   Assessment and Plan:   Follow Up Instructions:    I discussed the assessment and treatment plan with the patient. The patient was provided an opportunity to ask questions and all were answered. The patient agreed with the plan and demonstrated an understanding of the instructions.   The patient was advised to call back or seek an in-person evaluation if the symptoms worsen or if the condition fails to improve as anticipated.  I provided *** minutes of non-face-to-face time during this encounter.   Suzzanne Cloud, NP

## 2018-08-22 ENCOUNTER — Telehealth: Payer: Self-pay | Admitting: *Deleted

## 2018-08-22 ENCOUNTER — Ambulatory Visit: Payer: Self-pay | Admitting: Neurology

## 2018-08-22 LAB — HEPATITIS PANEL, ACUTE
HCV Ab: 0.1 s/co ratio (ref 0.0–0.9)
Hep A IgM: NEGATIVE
Hep B C IgM: NEGATIVE
Hepatitis B Surface Ag: NEGATIVE

## 2018-08-22 NOTE — Telephone Encounter (Signed)
I called and no answer x 2 to speak to someone re: VV today at 1345.

## 2018-08-22 NOTE — ED Provider Notes (Signed)
[facility] EMERGENCY DEPARTMENT Provider Note   CSN: [id number] Arrival date & time: 08/16/18  1308     History   Chief Complaint Chief Complaint  Patient presents with  . Fever    HPI Miguel Tucker is a 85 y.o. male.     Pt was at dialysis and had a 100.4 fever.  No fever or symptoms now.  Pt complains of toenail problems  The history is provided by the patient. No language interpreter was used.  Fever Max temp prior to arrival:  100.4 Temp source:  Oral Severity:  Mild Onset quality:  Sudden Timing:  Unable to specify Progression:  Resolved Chronicity:  New Relieved by:  Nothing Worsened by:  Nothing Ineffective treatments:  Acetaminophen Associated symptoms: no chest pain, no congestion, no cough, no diarrhea, no headaches and no rash     Past Medical History:  Diagnosis Date  . Asthma   . Chronic kidney disease    on dialysis  . Chronic knee pain   . Dementia ([place])   . Dementia ([place])   . Diabetes mellitus    type 2  . ESRD (end stage renal disease) ([place])   . Frequent urination at night   . Gout   . Hard of hearing   . High cholesterol   . Hypertension   . Hypokalemia   . Radicular pain of left lower extremity     Patient Active Problem List   Diagnosis Date Noted  . Gait instability 01/12/2018  . Acute ischemic stroke ([place]) 12/13/2017  . Weakness of left upper extremity 12/12/2017  . Hypokalemia 12/12/2017  . ESRD (end stage renal disease) ([place]) 12/12/2017  . HLD (hyperlipidemia) 12/12/2017  . Gout 12/12/2017  . BPH (benign prostatic hyperplasia) 12/12/2017  . Asthma 12/12/2017  . GERD (gastroesophageal reflux disease) 12/12/2017  . Symptomatic anemia 11/25/2017  . Generalized weakness 09/06/2017  . HTN (hypertension) 09/06/2017  . Anemia, chronic disease 09/06/2017  . Dementia ([place]) 09/06/2017  . Onychomycosis 04/11/2017  . Hammer toe 04/11/2017  . Diabetes mellitus ([place]) 04/11/2017  . Osteoarthritis of knee 02/11/2017  .  Difficulty in walking(719.7) 12/09/2011  . Radicular pain of left lower extremity 12/09/2011  . Wound healing, delayed 11/06/2010    Past Surgical History:  Procedure Laterality Date  . APPENDECTOMY    . AV FISTULA PLACEMENT Left 10/18/2017   Procedure: INSERTION OF ARTERIOVENOUS (AV) GORE-TEX GRAFT LEFT UPPER  ARM;  Surgeon: Waynetta Sandy, MD;  Location: [facility];  Service: Vascular;  Laterality: Left;  . BASCILIC VEIN TRANSPOSITION Left 09/09/2015   Procedure: FIRST STAGE BRACHIAL VEIN TRANSPOSITION LEFT ARM;  Surgeon: Elam Dutch, MD;  Location: [facility];  Service: Vascular;  Laterality: Left;  . BASCILIC VEIN TRANSPOSITION Left 02/20/2016   Procedure: LEFT ARM SECOND STAGE BASILIC VEIN TRANSPOSITION;  Surgeon: Rosetta Posner, MD;  Location: [facility];  Service: Vascular;  Laterality: Left;  . ESOPHAGOGASTRODUODENOSCOPY N/A 11/27/2017   Procedure: ESOPHAGOGASTRODUODENOSCOPY (EGD);  Surgeon: Rogene Houston, MD;  [facility];  Service: Endoscopy;  Laterality: N/A;  . INSERTION OF DIALYSIS CATHETER Right 10/18/2017   Procedure: INSERTION OF TUNNELED  DIALYSIS CATHETER RIGHT INTERNAL JUGULAR;  Surgeon: Waynetta Sandy, MD;  Location: [facility];  Service: Vascular;  Laterality: Right;  . OTHER SURGICAL HISTORY     testicular tumor- benign   . ROTATOR CUFF REPAIR Right   . WOUND DEBRIDEMENT  06/01/2011   Procedure: DEBRIDEMENT ABDOMINAL WOUND;  Surgeon: [name], MD;  Location: [facility];  Service: General;  Laterality: N/A;  Remove Sutures         Home Medications    Prior to Admission medications   Medication Sig Start Date End Date Taking? Authorizing Provider  allopurinol (ZYLOPRIM) 100 MG tablet Take 100 mg by mouth daily.  07/23/15   [provider]  amLODipine (NORVASC) 10 MG tablet Take 10 mg by mouth daily.    [provider]  amLODipine (NORVASC) 5 MG tablet Take 1 tablet (5 mg total) by mouth daily. Patient not taking: Reported on  08/19/2018 11/29/17   Murlean Iba, MD  apixaban (ELIQUIS) 2.5 MG TABS tablet Take 5 mg by mouth once. 08/19/18 08/19/18  [provider]  aspirin EC 81 MG tablet Take 1 tablet (81 mg total) by mouth every morning. 12/03/17   Johnson, Clanford L, MD  atorvastatin (LIPITOR) 40 MG tablet TAKE 1 TABLET BY MOUTH ONCE DAILY AT 6PM Patient taking differently: Take 40 mg by mouth at bedtime.  02/21/18   [name], MD  calcitRIOL (ROCALTROL) 0.25 MCG capsule Take 1 capsule (0.25 mcg total) by mouth every Monday, Wednesday, and Friday with hemodialysis. Patient not taking: Reported on 08/19/2018 12/16/17   Barton Dubois, MD  colchicine 0.6 MG tablet TAKE 1 TABLET BY MOUTH ONCE DAILY. Patient taking differently: Take 0.3 mg by mouth daily.  02/21/18   [name], MD  donepezil (ARICEPT) 10 MG tablet Take 10 mg by mouth at bedtime.    [provider]  donepezil (ARICEPT) 5 MG tablet Take 2 tablets (10 mg total) by mouth at bedtime. Patient not taking: Reported on 08/19/2018 12/14/17   Barton Dubois, MD  enoxaparin (LOVENOX) 30 MG/0.3ML injection Inject 30 mg into the skin See admin instructions. Inject 30 mg into the skin every morning at 9 AM to prevent blood clotting (for 21 days) 08/20/18 09/09/18  [provider]  ferrous sulfate 325 (65 FE) MG tablet Take 325 mg by mouth daily with breakfast.    [provider]  finasteride (PROSCAR) 5 MG tablet Take 5 mg by mouth daily.    [provider]  loratadine (CLARITIN) 10 MG tablet Take 10 mg by mouth daily.    [provider]  LORazepam (ATIVAN) 0.5 MG tablet Take 0.5 mg by mouth See admin instructions. Take 0.5 mg by mouth in the morning on Mon/Wed/Fri for anxiety- prior to dialysis    [provider]  montelukast (SINGULAIR) 10 MG tablet Take 10 mg by mouth at bedtime.    [provider]  multivitamin (RENA-VIT) TABS tablet Take 1 tablet by mouth at bedtime.    [provider]  sevelamer (RENAGEL) 800 MG tablet Take 800 mg by mouth 3 (three) times daily.    [provider]    Family History History reviewed. No pertinent family history.  Social History Social History   Tobacco Use  . Smoking status: Former Smoker    Quit date: 11/06/1975    Years since quitting: 42.8  . Smokeless tobacco: Never Used  Substance Use Topics  . Alcohol use: No  . Drug use: No     Allergies   Penicillins   Review of Systems Review of Systems  Constitutional: Positive for fever. Negative for appetite change and fatigue.  HENT: Negative for congestion, ear discharge and sinus pressure.   Eyes: Negative for discharge.  Respiratory: Negative for cough.   Cardiovascular: Negative for chest pain.  Gastrointestinal: Negative for abdominal  pain and diarrhea.  Genitourinary: Negative for frequency and hematuria.  Musculoskeletal: Negative for back pain.       Toenail problems  Skin: Negative for rash.  Neurological: Negative for seizures and headaches.  Psychiatric/Behavioral: Negative for hallucinations.     Physical Exam Updated Vital Signs BP (!) 156/66   Pulse 92   Temp 98.4 F (36.9 C) (Oral)   Resp 18   Ht 5\' 5"  (1.651 m)   Wt 68 kg   SpO2 100%   BMI 24.96 kg/m   Physical Exam Vitals signs and nursing note reviewed.  Constitutional:      Appearance: He is well-developed.  HENT:     Head: Normocephalic.     Nose: Nose normal.  Eyes:     General: No scleral icterus.    Conjunctiva/sclera: Conjunctivae normal.  Neck:     Musculoskeletal: Neck supple.     Thyroid: No thyromegaly.     Trachea: No tracheal deviation.  Cardiovascular:     Rate and Rhythm: Normal rate and regular rhythm.     Heart sounds: No murmur. No friction rub. No gallop.   Pulmonary:     Breath sounds: No stridor. No wheezing or rales.  Chest:     Chest wall: No tenderness.  Abdominal:     General: There is no distension.     Tenderness: There is no  abdominal tenderness. There is no rebound.  Musculoskeletal: Normal range of motion.     Comments: Inflamed toes and toenails  Lymphadenopathy:     Cervical: No cervical adenopathy.  Skin:    General: Skin is warm.     Findings: No erythema or rash.  Neurological:     Mental Status: He is oriented to person, place, and time.     Motor: No abnormal muscle tone.     Coordination: Coordination normal.  Psychiatric:        Behavior: Behavior normal.      ED Treatments / Results  Labs (all labs ordered are listed, but only abnormal results are displayed) Labs Reviewed - No data to display  EKG None  Radiology No results found.  Procedures Procedures (including critical care time)  Medications Ordered in ED Medications - No data to display   Initial Impression / Assessment and Plan / ED Course  I have reviewed the triage vital signs and the nursing notes.  Pertinent labs & imaging results that were available during my care of the patient were reviewed by me and considered in my medical decision making (see chart for details).        Pt with fever that has resolved.  No symptoms.  Toenail problems.  Pt was referred to podiatrist.  Final Clinical Impressions(s) / ED Diagnoses   Final diagnoses:  Toe pain, chronic, right    ED Discharge Orders    None       Milton Ferguson, MD 08/22/18 1330

## 2018-08-22 NOTE — Telephone Encounter (Signed)
I called again, (had to call back again after phone disconnected).  Spoke to Nonie Hoyer Doctor, hospital at [facility].  Cancelled today's appt.  R/S to Thursday 09-21-18 at 0845 for doxy.  She would have them call to give email so for [place] to send link.  Pt is positive covid, not eating right now.

## 2018-08-23 DIAGNOSIS — M2550 Pain in unspecified joint: Secondary | ICD-10-CM | POA: Diagnosis not present

## 2018-08-23 DIAGNOSIS — U071 COVID-19: Secondary | ICD-10-CM | POA: Diagnosis not present

## 2018-08-23 DIAGNOSIS — Z7401 Bed confinement status: Secondary | ICD-10-CM | POA: Diagnosis not present

## 2018-08-25 DIAGNOSIS — U071 COVID-19: Secondary | ICD-10-CM | POA: Diagnosis not present

## 2018-08-25 DIAGNOSIS — R0902 Hypoxemia: Secondary | ICD-10-CM | POA: Diagnosis not present

## 2018-08-25 DIAGNOSIS — I959 Hypotension, unspecified: Secondary | ICD-10-CM | POA: Diagnosis not present

## 2018-08-25 DIAGNOSIS — Z7401 Bed confinement status: Secondary | ICD-10-CM | POA: Diagnosis not present

## 2018-08-25 DIAGNOSIS — R58 Hemorrhage, not elsewhere classified: Secondary | ICD-10-CM | POA: Diagnosis not present

## 2018-08-25 DIAGNOSIS — R52 Pain, unspecified: Secondary | ICD-10-CM | POA: Diagnosis not present

## 2018-08-25 DIAGNOSIS — M2550 Pain in unspecified joint: Secondary | ICD-10-CM | POA: Diagnosis not present

## 2018-08-28 DIAGNOSIS — M2550 Pain in unspecified joint: Secondary | ICD-10-CM | POA: Diagnosis not present

## 2018-08-28 DIAGNOSIS — R0902 Hypoxemia: Secondary | ICD-10-CM | POA: Diagnosis not present

## 2018-08-28 DIAGNOSIS — U071 COVID-19: Secondary | ICD-10-CM | POA: Diagnosis not present

## 2018-08-28 DIAGNOSIS — Z03818 Encounter for observation for suspected exposure to other biological agents ruled out: Secondary | ICD-10-CM | POA: Diagnosis not present

## 2018-08-28 DIAGNOSIS — R52 Pain, unspecified: Secondary | ICD-10-CM | POA: Diagnosis not present

## 2018-08-28 DIAGNOSIS — R402411 Glasgow coma scale score 13-15, in the field [EMT or ambulance]: Secondary | ICD-10-CM | POA: Diagnosis not present

## 2018-08-28 DIAGNOSIS — Z7401 Bed confinement status: Secondary | ICD-10-CM | POA: Diagnosis not present

## 2018-08-30 DIAGNOSIS — Z7401 Bed confinement status: Secondary | ICD-10-CM | POA: Diagnosis not present

## 2018-08-30 DIAGNOSIS — R410 Disorientation, unspecified: Secondary | ICD-10-CM | POA: Diagnosis not present

## 2018-08-30 DIAGNOSIS — Z209 Contact with and (suspected) exposure to unspecified communicable disease: Secondary | ICD-10-CM | POA: Diagnosis not present

## 2018-08-30 DIAGNOSIS — R69 Illness, unspecified: Secondary | ICD-10-CM | POA: Diagnosis not present

## 2018-08-30 DIAGNOSIS — U071 COVID-19: Secondary | ICD-10-CM | POA: Diagnosis not present

## 2018-08-30 DIAGNOSIS — M2550 Pain in unspecified joint: Secondary | ICD-10-CM | POA: Diagnosis not present

## 2018-08-30 DIAGNOSIS — R58 Hemorrhage, not elsewhere classified: Secondary | ICD-10-CM | POA: Diagnosis not present

## 2018-08-31 DIAGNOSIS — Z03818 Encounter for observation for suspected exposure to other biological agents ruled out: Secondary | ICD-10-CM | POA: Diagnosis not present

## 2018-09-01 DIAGNOSIS — M109 Gout, unspecified: Secondary | ICD-10-CM | POA: Diagnosis not present

## 2018-09-01 DIAGNOSIS — U071 COVID-19: Secondary | ICD-10-CM | POA: Diagnosis not present

## 2018-09-01 DIAGNOSIS — J45909 Unspecified asthma, uncomplicated: Secondary | ICD-10-CM | POA: Diagnosis not present

## 2018-09-01 DIAGNOSIS — R52 Pain, unspecified: Secondary | ICD-10-CM | POA: Diagnosis not present

## 2018-09-01 DIAGNOSIS — N186 End stage renal disease: Secondary | ICD-10-CM | POA: Diagnosis not present

## 2018-09-01 DIAGNOSIS — I959 Hypotension, unspecified: Secondary | ICD-10-CM | POA: Diagnosis not present

## 2018-09-01 DIAGNOSIS — Z992 Dependence on renal dialysis: Secondary | ICD-10-CM | POA: Diagnosis not present

## 2018-09-01 DIAGNOSIS — R410 Disorientation, unspecified: Secondary | ICD-10-CM | POA: Diagnosis not present

## 2018-09-01 DIAGNOSIS — I10 Essential (primary) hypertension: Secondary | ICD-10-CM | POA: Diagnosis not present

## 2018-09-01 DIAGNOSIS — R69 Illness, unspecified: Secondary | ICD-10-CM | POA: Diagnosis not present

## 2018-09-01 DIAGNOSIS — E1129 Type 2 diabetes mellitus with other diabetic kidney complication: Secondary | ICD-10-CM | POA: Diagnosis not present

## 2018-09-01 DIAGNOSIS — M2550 Pain in unspecified joint: Secondary | ICD-10-CM | POA: Diagnosis not present

## 2018-09-01 DIAGNOSIS — Z7401 Bed confinement status: Secondary | ICD-10-CM | POA: Diagnosis not present

## 2018-09-04 DIAGNOSIS — R69 Illness, unspecified: Secondary | ICD-10-CM | POA: Diagnosis not present

## 2018-09-04 DIAGNOSIS — R5381 Other malaise: Secondary | ICD-10-CM | POA: Diagnosis not present

## 2018-09-04 DIAGNOSIS — R0902 Hypoxemia: Secondary | ICD-10-CM | POA: Diagnosis not present

## 2018-09-04 DIAGNOSIS — Z03818 Encounter for observation for suspected exposure to other biological agents ruled out: Secondary | ICD-10-CM | POA: Diagnosis not present

## 2018-09-04 DIAGNOSIS — N186 End stage renal disease: Secondary | ICD-10-CM | POA: Diagnosis not present

## 2018-09-04 DIAGNOSIS — U071 COVID-19: Secondary | ICD-10-CM | POA: Diagnosis not present

## 2018-09-04 DIAGNOSIS — R0989 Other specified symptoms and signs involving the circulatory and respiratory systems: Secondary | ICD-10-CM | POA: Diagnosis not present

## 2018-09-04 DIAGNOSIS — M2550 Pain in unspecified joint: Secondary | ICD-10-CM | POA: Diagnosis not present

## 2018-09-04 DIAGNOSIS — I129 Hypertensive chronic kidney disease with stage 1 through stage 4 chronic kidney disease, or unspecified chronic kidney disease: Secondary | ICD-10-CM | POA: Diagnosis not present

## 2018-09-04 DIAGNOSIS — Z7401 Bed confinement status: Secondary | ICD-10-CM | POA: Diagnosis not present

## 2018-09-04 DIAGNOSIS — Z992 Dependence on renal dialysis: Secondary | ICD-10-CM | POA: Diagnosis not present

## 2018-09-06 DIAGNOSIS — G309 Alzheimer's disease, unspecified: Secondary | ICD-10-CM | POA: Diagnosis not present

## 2018-09-06 DIAGNOSIS — U071 COVID-19: Secondary | ICD-10-CM | POA: Diagnosis not present

## 2018-09-06 DIAGNOSIS — D631 Anemia in chronic kidney disease: Secondary | ICD-10-CM | POA: Diagnosis not present

## 2018-09-06 DIAGNOSIS — R69 Illness, unspecified: Secondary | ICD-10-CM | POA: Diagnosis not present

## 2018-09-06 DIAGNOSIS — N186 End stage renal disease: Secondary | ICD-10-CM | POA: Diagnosis not present

## 2018-09-06 DIAGNOSIS — I10 Essential (primary) hypertension: Secondary | ICD-10-CM | POA: Diagnosis not present

## 2018-09-06 DIAGNOSIS — D509 Iron deficiency anemia, unspecified: Secondary | ICD-10-CM | POA: Diagnosis not present

## 2018-09-06 DIAGNOSIS — Z7401 Bed confinement status: Secondary | ICD-10-CM | POA: Diagnosis not present

## 2018-09-06 DIAGNOSIS — Z209 Contact with and (suspected) exposure to unspecified communicable disease: Secondary | ICD-10-CM | POA: Diagnosis not present

## 2018-09-06 DIAGNOSIS — Z23 Encounter for immunization: Secondary | ICD-10-CM | POA: Diagnosis not present

## 2018-09-06 DIAGNOSIS — R52 Pain, unspecified: Secondary | ICD-10-CM | POA: Diagnosis not present

## 2018-09-06 DIAGNOSIS — Z992 Dependence on renal dialysis: Secondary | ICD-10-CM | POA: Diagnosis not present

## 2018-09-06 DIAGNOSIS — R0902 Hypoxemia: Secondary | ICD-10-CM | POA: Diagnosis not present

## 2018-09-06 DIAGNOSIS — E785 Hyperlipidemia, unspecified: Secondary | ICD-10-CM | POA: Diagnosis not present

## 2018-09-06 DIAGNOSIS — M109 Gout, unspecified: Secondary | ICD-10-CM | POA: Diagnosis not present

## 2018-09-06 DIAGNOSIS — D689 Coagulation defect, unspecified: Secondary | ICD-10-CM | POA: Diagnosis not present

## 2018-09-06 DIAGNOSIS — E1129 Type 2 diabetes mellitus with other diabetic kidney complication: Secondary | ICD-10-CM | POA: Diagnosis not present

## 2018-09-06 DIAGNOSIS — M2550 Pain in unspecified joint: Secondary | ICD-10-CM | POA: Diagnosis not present

## 2018-09-06 DIAGNOSIS — J45909 Unspecified asthma, uncomplicated: Secondary | ICD-10-CM | POA: Diagnosis not present

## 2018-09-06 DIAGNOSIS — R5381 Other malaise: Secondary | ICD-10-CM | POA: Diagnosis not present

## 2018-09-06 DIAGNOSIS — N2581 Secondary hyperparathyroidism of renal origin: Secondary | ICD-10-CM | POA: Diagnosis not present

## 2018-09-08 DIAGNOSIS — M2550 Pain in unspecified joint: Secondary | ICD-10-CM | POA: Diagnosis not present

## 2018-09-08 DIAGNOSIS — R5381 Other malaise: Secondary | ICD-10-CM | POA: Diagnosis not present

## 2018-09-08 DIAGNOSIS — R58 Hemorrhage, not elsewhere classified: Secondary | ICD-10-CM | POA: Diagnosis not present

## 2018-09-08 DIAGNOSIS — U071 COVID-19: Secondary | ICD-10-CM | POA: Diagnosis not present

## 2018-09-08 DIAGNOSIS — Z7401 Bed confinement status: Secondary | ICD-10-CM | POA: Diagnosis not present

## 2018-09-08 DIAGNOSIS — R52 Pain, unspecified: Secondary | ICD-10-CM | POA: Diagnosis not present

## 2018-09-12 DIAGNOSIS — R4182 Altered mental status, unspecified: Secondary | ICD-10-CM | POA: Diagnosis not present

## 2018-09-12 DIAGNOSIS — Z209 Contact with and (suspected) exposure to unspecified communicable disease: Secondary | ICD-10-CM | POA: Diagnosis not present

## 2018-09-12 DIAGNOSIS — M2550 Pain in unspecified joint: Secondary | ICD-10-CM | POA: Diagnosis not present

## 2018-09-12 DIAGNOSIS — Z7401 Bed confinement status: Secondary | ICD-10-CM | POA: Diagnosis not present

## 2018-09-12 DIAGNOSIS — U071 COVID-19: Secondary | ICD-10-CM | POA: Diagnosis not present

## 2018-09-12 DIAGNOSIS — I959 Hypotension, unspecified: Secondary | ICD-10-CM | POA: Diagnosis not present

## 2018-09-14 DIAGNOSIS — R1084 Generalized abdominal pain: Secondary | ICD-10-CM | POA: Diagnosis not present

## 2018-09-14 DIAGNOSIS — R52 Pain, unspecified: Secondary | ICD-10-CM | POA: Diagnosis not present

## 2018-09-14 DIAGNOSIS — Z7401 Bed confinement status: Secondary | ICD-10-CM | POA: Diagnosis not present

## 2018-09-14 DIAGNOSIS — U071 COVID-19: Secondary | ICD-10-CM | POA: Diagnosis not present

## 2018-09-14 DIAGNOSIS — R531 Weakness: Secondary | ICD-10-CM | POA: Diagnosis not present

## 2018-09-14 DIAGNOSIS — M2550 Pain in unspecified joint: Secondary | ICD-10-CM | POA: Diagnosis not present

## 2018-09-14 DIAGNOSIS — Z209 Contact with and (suspected) exposure to unspecified communicable disease: Secondary | ICD-10-CM | POA: Diagnosis not present

## 2018-09-15 DIAGNOSIS — Z992 Dependence on renal dialysis: Secondary | ICD-10-CM | POA: Diagnosis not present

## 2018-09-15 DIAGNOSIS — G309 Alzheimer's disease, unspecified: Secondary | ICD-10-CM | POA: Diagnosis not present

## 2018-09-15 DIAGNOSIS — U071 COVID-19: Secondary | ICD-10-CM | POA: Diagnosis not present

## 2018-09-15 DIAGNOSIS — N186 End stage renal disease: Secondary | ICD-10-CM | POA: Diagnosis not present

## 2018-09-16 DIAGNOSIS — M2550 Pain in unspecified joint: Secondary | ICD-10-CM | POA: Diagnosis not present

## 2018-09-16 DIAGNOSIS — Z7401 Bed confinement status: Secondary | ICD-10-CM | POA: Diagnosis not present

## 2018-09-16 DIAGNOSIS — R52 Pain, unspecified: Secondary | ICD-10-CM | POA: Diagnosis not present

## 2018-09-16 DIAGNOSIS — R4182 Altered mental status, unspecified: Secondary | ICD-10-CM | POA: Diagnosis not present

## 2018-09-16 DIAGNOSIS — U071 COVID-19: Secondary | ICD-10-CM | POA: Diagnosis not present

## 2018-09-19 DIAGNOSIS — R410 Disorientation, unspecified: Secondary | ICD-10-CM | POA: Diagnosis not present

## 2018-09-19 DIAGNOSIS — I10 Essential (primary) hypertension: Secondary | ICD-10-CM | POA: Diagnosis not present

## 2018-09-19 DIAGNOSIS — Z7401 Bed confinement status: Secondary | ICD-10-CM | POA: Diagnosis not present

## 2018-09-19 DIAGNOSIS — M2550 Pain in unspecified joint: Secondary | ICD-10-CM | POA: Diagnosis not present

## 2018-09-19 DIAGNOSIS — U071 COVID-19: Secondary | ICD-10-CM | POA: Diagnosis not present

## 2018-09-20 NOTE — Progress Notes (Deleted)
Virtual Visit via Video Note  I connected with Miguel Tucker on 09/20/18 at  8:45 AM EDT by a video enabled telemedicine application and verified that I am speaking with the correct person using two identifiers.  Location: Patient: *** Provider: ***   I discussed the limitations of evaluation and management by telemedicine and the availability of in person appointments. The patient expressed understanding and agreed to proceed.  History of Present Illness: 09/21/2018 SS: Miguel Tucker is an 85 year old male with history of memory disturbance.  He remains on hemodialysis.  His last memory score was 12 out of 30.  He resides at [facility].  He has recovered from [place].   11/01/2017 MM: Miguel Tucker is an 85 year old male with a history of memory disturbance.  He returns today for follow-up.  He just recently started hemodialysis.  He is here today with his wife and son.  He does have a been any change in his memory.  His son reports that his short-term memory has diminished and reports that he has no long-term memory.  He lives at home with his wife.  He is able to complete all ADLs independently but does require prompting to reports good appetite.  Denies any trouble sleeping.  Denies any changes in mood or behavior.  Denies hallucinations.  He does not operate a motor vehicle. the son reports that his mother has also been diagnosed with dementia.  Their primary care has recommended that they should not live independently.  He returns today for evaluation.   Observations/Objective:   Assessment and Plan:   Follow Up Instructions:    I discussed the assessment and treatment plan with the patient. The patient was provided an opportunity to ask questions and all were answered. The patient agreed with the plan and demonstrated an understanding of the instructions.   The patient was advised to call back or seek an in-person evaluation if the symptoms worsen or if the  condition fails to improve as anticipated.  I provided *** minutes of non-face-to-face time during this encounter.   Miguel Cloud, NP

## 2018-09-21 ENCOUNTER — Telehealth: Payer: Self-pay | Admitting: Neurology

## 2018-09-21 ENCOUNTER — Telehealth: Payer: Self-pay

## 2018-09-21 ENCOUNTER — Ambulatory Visit: Payer: Self-pay | Admitting: Neurology

## 2018-09-21 DIAGNOSIS — R0902 Hypoxemia: Secondary | ICD-10-CM | POA: Diagnosis not present

## 2018-09-21 DIAGNOSIS — R5381 Other malaise: Secondary | ICD-10-CM | POA: Diagnosis not present

## 2018-09-21 DIAGNOSIS — R69 Illness, unspecified: Secondary | ICD-10-CM | POA: Diagnosis not present

## 2018-09-21 DIAGNOSIS — U071 COVID-19: Secondary | ICD-10-CM | POA: Diagnosis not present

## 2018-09-21 DIAGNOSIS — Z7401 Bed confinement status: Secondary | ICD-10-CM | POA: Diagnosis not present

## 2018-09-21 DIAGNOSIS — M2550 Pain in unspecified joint: Secondary | ICD-10-CM | POA: Diagnosis not present

## 2018-09-21 NOTE — Telephone Encounter (Signed)
Patient was a no call/no show for their appointment today.   

## 2018-09-22 DIAGNOSIS — E119 Type 2 diabetes mellitus without complications: Secondary | ICD-10-CM | POA: Diagnosis not present

## 2018-09-22 DIAGNOSIS — N400 Enlarged prostate without lower urinary tract symptoms: Secondary | ICD-10-CM | POA: Diagnosis not present

## 2018-09-22 DIAGNOSIS — R41841 Cognitive communication deficit: Secondary | ICD-10-CM | POA: Diagnosis not present

## 2018-09-22 DIAGNOSIS — R1311 Dysphagia, oral phase: Secondary | ICD-10-CM | POA: Diagnosis not present

## 2018-09-22 DIAGNOSIS — R2681 Unsteadiness on feet: Secondary | ICD-10-CM | POA: Diagnosis not present

## 2018-09-22 DIAGNOSIS — U071 COVID-19: Secondary | ICD-10-CM | POA: Diagnosis not present

## 2018-09-22 DIAGNOSIS — R278 Other lack of coordination: Secondary | ICD-10-CM | POA: Diagnosis not present

## 2018-09-22 DIAGNOSIS — D631 Anemia in chronic kidney disease: Secondary | ICD-10-CM | POA: Diagnosis not present

## 2018-09-22 DIAGNOSIS — R69 Illness, unspecified: Secondary | ICD-10-CM | POA: Diagnosis not present

## 2018-09-22 DIAGNOSIS — R296 Repeated falls: Secondary | ICD-10-CM | POA: Diagnosis not present

## 2018-09-22 DIAGNOSIS — M6281 Muscle weakness (generalized): Secondary | ICD-10-CM | POA: Diagnosis not present

## 2018-09-22 DIAGNOSIS — G309 Alzheimer's disease, unspecified: Secondary | ICD-10-CM | POA: Diagnosis not present

## 2018-09-24 DIAGNOSIS — G309 Alzheimer's disease, unspecified: Secondary | ICD-10-CM | POA: Diagnosis not present

## 2018-09-24 DIAGNOSIS — U071 COVID-19: Secondary | ICD-10-CM | POA: Diagnosis not present

## 2018-09-24 DIAGNOSIS — N186 End stage renal disease: Secondary | ICD-10-CM | POA: Diagnosis not present

## 2018-09-24 DIAGNOSIS — I10 Essential (primary) hypertension: Secondary | ICD-10-CM | POA: Diagnosis not present

## 2018-09-24 DIAGNOSIS — E1129 Type 2 diabetes mellitus with other diabetic kidney complication: Secondary | ICD-10-CM | POA: Diagnosis not present

## 2018-09-24 DIAGNOSIS — Z992 Dependence on renal dialysis: Secondary | ICD-10-CM | POA: Diagnosis not present

## 2018-09-25 DIAGNOSIS — R69 Illness, unspecified: Secondary | ICD-10-CM | POA: Diagnosis not present

## 2018-09-25 DIAGNOSIS — R41841 Cognitive communication deficit: Secondary | ICD-10-CM | POA: Diagnosis not present

## 2018-10-02 DIAGNOSIS — R69 Illness, unspecified: Secondary | ICD-10-CM | POA: Diagnosis not present

## 2018-10-02 DIAGNOSIS — R41841 Cognitive communication deficit: Secondary | ICD-10-CM | POA: Diagnosis not present

## 2018-10-03 DIAGNOSIS — R69 Illness, unspecified: Secondary | ICD-10-CM | POA: Diagnosis not present

## 2018-10-03 DIAGNOSIS — R41841 Cognitive communication deficit: Secondary | ICD-10-CM | POA: Diagnosis not present

## 2018-10-04 DIAGNOSIS — R41841 Cognitive communication deficit: Secondary | ICD-10-CM | POA: Diagnosis not present

## 2018-10-04 DIAGNOSIS — N186 End stage renal disease: Secondary | ICD-10-CM | POA: Diagnosis not present

## 2018-10-04 DIAGNOSIS — R69 Illness, unspecified: Secondary | ICD-10-CM | POA: Diagnosis not present

## 2018-10-04 DIAGNOSIS — Z992 Dependence on renal dialysis: Secondary | ICD-10-CM | POA: Diagnosis not present

## 2018-10-04 DIAGNOSIS — I129 Hypertensive chronic kidney disease with stage 1 through stage 4 chronic kidney disease, or unspecified chronic kidney disease: Secondary | ICD-10-CM | POA: Diagnosis not present

## 2018-10-05 DIAGNOSIS — R296 Repeated falls: Secondary | ICD-10-CM | POA: Diagnosis not present

## 2018-10-05 DIAGNOSIS — R41841 Cognitive communication deficit: Secondary | ICD-10-CM | POA: Diagnosis not present

## 2018-10-05 DIAGNOSIS — R278 Other lack of coordination: Secondary | ICD-10-CM | POA: Diagnosis not present

## 2018-10-05 DIAGNOSIS — R69 Illness, unspecified: Secondary | ICD-10-CM | POA: Diagnosis not present

## 2018-10-06 DIAGNOSIS — E039 Hypothyroidism, unspecified: Secondary | ICD-10-CM | POA: Diagnosis not present

## 2018-10-06 DIAGNOSIS — D509 Iron deficiency anemia, unspecified: Secondary | ICD-10-CM | POA: Diagnosis not present

## 2018-10-06 DIAGNOSIS — N2581 Secondary hyperparathyroidism of renal origin: Secondary | ICD-10-CM | POA: Diagnosis not present

## 2018-10-06 DIAGNOSIS — R278 Other lack of coordination: Secondary | ICD-10-CM | POA: Diagnosis not present

## 2018-10-06 DIAGNOSIS — D689 Coagulation defect, unspecified: Secondary | ICD-10-CM | POA: Diagnosis not present

## 2018-10-06 DIAGNOSIS — N186 End stage renal disease: Secondary | ICD-10-CM | POA: Diagnosis not present

## 2018-10-06 DIAGNOSIS — D631 Anemia in chronic kidney disease: Secondary | ICD-10-CM | POA: Diagnosis not present

## 2018-10-06 DIAGNOSIS — R41841 Cognitive communication deficit: Secondary | ICD-10-CM | POA: Diagnosis not present

## 2018-10-06 DIAGNOSIS — R296 Repeated falls: Secondary | ICD-10-CM | POA: Diagnosis not present

## 2018-10-06 DIAGNOSIS — E1129 Type 2 diabetes mellitus with other diabetic kidney complication: Secondary | ICD-10-CM | POA: Diagnosis not present

## 2018-10-06 DIAGNOSIS — R69 Illness, unspecified: Secondary | ICD-10-CM | POA: Diagnosis not present

## 2018-10-06 DIAGNOSIS — Z992 Dependence on renal dialysis: Secondary | ICD-10-CM | POA: Diagnosis not present

## 2018-10-09 DIAGNOSIS — R41841 Cognitive communication deficit: Secondary | ICD-10-CM | POA: Diagnosis not present

## 2018-10-09 DIAGNOSIS — R69 Illness, unspecified: Secondary | ICD-10-CM | POA: Diagnosis not present

## 2018-10-09 DIAGNOSIS — R296 Repeated falls: Secondary | ICD-10-CM | POA: Diagnosis not present

## 2018-10-09 DIAGNOSIS — R278 Other lack of coordination: Secondary | ICD-10-CM | POA: Diagnosis not present

## 2018-10-10 DIAGNOSIS — R69 Illness, unspecified: Secondary | ICD-10-CM | POA: Diagnosis not present

## 2018-10-10 DIAGNOSIS — R41841 Cognitive communication deficit: Secondary | ICD-10-CM | POA: Diagnosis not present

## 2018-10-10 DIAGNOSIS — R296 Repeated falls: Secondary | ICD-10-CM | POA: Diagnosis not present

## 2018-10-10 DIAGNOSIS — R278 Other lack of coordination: Secondary | ICD-10-CM | POA: Diagnosis not present

## 2018-10-11 DIAGNOSIS — R296 Repeated falls: Secondary | ICD-10-CM | POA: Diagnosis not present

## 2018-10-11 DIAGNOSIS — R278 Other lack of coordination: Secondary | ICD-10-CM | POA: Diagnosis not present

## 2018-10-11 DIAGNOSIS — R41841 Cognitive communication deficit: Secondary | ICD-10-CM | POA: Diagnosis not present

## 2018-10-11 DIAGNOSIS — R69 Illness, unspecified: Secondary | ICD-10-CM | POA: Diagnosis not present

## 2018-10-12 DIAGNOSIS — R278 Other lack of coordination: Secondary | ICD-10-CM | POA: Diagnosis not present

## 2018-10-12 DIAGNOSIS — R41841 Cognitive communication deficit: Secondary | ICD-10-CM | POA: Diagnosis not present

## 2018-10-12 DIAGNOSIS — R69 Illness, unspecified: Secondary | ICD-10-CM | POA: Diagnosis not present

## 2018-10-12 DIAGNOSIS — R296 Repeated falls: Secondary | ICD-10-CM | POA: Diagnosis not present

## 2018-10-13 DIAGNOSIS — R296 Repeated falls: Secondary | ICD-10-CM | POA: Diagnosis not present

## 2018-10-13 DIAGNOSIS — R41841 Cognitive communication deficit: Secondary | ICD-10-CM | POA: Diagnosis not present

## 2018-10-13 DIAGNOSIS — R278 Other lack of coordination: Secondary | ICD-10-CM | POA: Diagnosis not present

## 2018-10-13 DIAGNOSIS — R69 Illness, unspecified: Secondary | ICD-10-CM | POA: Diagnosis not present

## 2018-10-16 DIAGNOSIS — R296 Repeated falls: Secondary | ICD-10-CM | POA: Diagnosis not present

## 2018-10-16 DIAGNOSIS — R278 Other lack of coordination: Secondary | ICD-10-CM | POA: Diagnosis not present

## 2018-10-16 DIAGNOSIS — R69 Illness, unspecified: Secondary | ICD-10-CM | POA: Diagnosis not present

## 2018-10-16 DIAGNOSIS — R41841 Cognitive communication deficit: Secondary | ICD-10-CM | POA: Diagnosis not present

## 2018-10-17 DIAGNOSIS — R41841 Cognitive communication deficit: Secondary | ICD-10-CM | POA: Diagnosis not present

## 2018-10-17 DIAGNOSIS — R69 Illness, unspecified: Secondary | ICD-10-CM | POA: Diagnosis not present

## 2018-10-17 DIAGNOSIS — R296 Repeated falls: Secondary | ICD-10-CM | POA: Diagnosis not present

## 2018-10-17 DIAGNOSIS — M79631 Pain in right forearm: Secondary | ICD-10-CM | POA: Diagnosis not present

## 2018-10-17 DIAGNOSIS — M19031 Primary osteoarthritis, right wrist: Secondary | ICD-10-CM | POA: Diagnosis not present

## 2018-10-17 DIAGNOSIS — R2231 Localized swelling, mass and lump, right upper limb: Secondary | ICD-10-CM | POA: Diagnosis not present

## 2018-10-17 DIAGNOSIS — R278 Other lack of coordination: Secondary | ICD-10-CM | POA: Diagnosis not present

## 2018-10-17 DIAGNOSIS — M19021 Primary osteoarthritis, right elbow: Secondary | ICD-10-CM | POA: Diagnosis not present

## 2018-10-18 DIAGNOSIS — R69 Illness, unspecified: Secondary | ICD-10-CM | POA: Diagnosis not present

## 2018-10-18 DIAGNOSIS — R296 Repeated falls: Secondary | ICD-10-CM | POA: Diagnosis not present

## 2018-10-18 DIAGNOSIS — R278 Other lack of coordination: Secondary | ICD-10-CM | POA: Diagnosis not present

## 2018-10-18 DIAGNOSIS — R41841 Cognitive communication deficit: Secondary | ICD-10-CM | POA: Diagnosis not present

## 2018-10-18 DIAGNOSIS — E785 Hyperlipidemia, unspecified: Secondary | ICD-10-CM | POA: Diagnosis not present

## 2018-10-18 DIAGNOSIS — M10312 Gout due to renal impairment, left shoulder: Secondary | ICD-10-CM | POA: Diagnosis not present

## 2018-10-18 DIAGNOSIS — N186 End stage renal disease: Secondary | ICD-10-CM | POA: Diagnosis not present

## 2018-10-18 DIAGNOSIS — E1129 Type 2 diabetes mellitus with other diabetic kidney complication: Secondary | ICD-10-CM | POA: Diagnosis not present

## 2018-10-19 DIAGNOSIS — R2231 Localized swelling, mass and lump, right upper limb: Secondary | ICD-10-CM | POA: Diagnosis not present

## 2018-10-19 DIAGNOSIS — R41841 Cognitive communication deficit: Secondary | ICD-10-CM | POA: Diagnosis not present

## 2018-10-19 DIAGNOSIS — R69 Illness, unspecified: Secondary | ICD-10-CM | POA: Diagnosis not present

## 2018-10-19 DIAGNOSIS — M79601 Pain in right arm: Secondary | ICD-10-CM | POA: Diagnosis not present

## 2018-10-19 DIAGNOSIS — R296 Repeated falls: Secondary | ICD-10-CM | POA: Diagnosis not present

## 2018-10-19 DIAGNOSIS — R278 Other lack of coordination: Secondary | ICD-10-CM | POA: Diagnosis not present

## 2018-10-23 DIAGNOSIS — R296 Repeated falls: Secondary | ICD-10-CM | POA: Diagnosis not present

## 2018-10-23 DIAGNOSIS — R278 Other lack of coordination: Secondary | ICD-10-CM | POA: Diagnosis not present

## 2018-10-23 DIAGNOSIS — R41841 Cognitive communication deficit: Secondary | ICD-10-CM | POA: Diagnosis not present

## 2018-10-23 DIAGNOSIS — R69 Illness, unspecified: Secondary | ICD-10-CM | POA: Diagnosis not present

## 2018-10-25 DIAGNOSIS — R69 Illness, unspecified: Secondary | ICD-10-CM | POA: Diagnosis not present

## 2018-10-25 DIAGNOSIS — R278 Other lack of coordination: Secondary | ICD-10-CM | POA: Diagnosis not present

## 2018-10-25 DIAGNOSIS — R296 Repeated falls: Secondary | ICD-10-CM | POA: Diagnosis not present

## 2018-10-25 DIAGNOSIS — R41841 Cognitive communication deficit: Secondary | ICD-10-CM | POA: Diagnosis not present

## 2018-11-03 DIAGNOSIS — R278 Other lack of coordination: Secondary | ICD-10-CM | POA: Diagnosis not present

## 2018-11-03 DIAGNOSIS — R41841 Cognitive communication deficit: Secondary | ICD-10-CM | POA: Diagnosis not present

## 2018-11-03 DIAGNOSIS — R69 Illness, unspecified: Secondary | ICD-10-CM | POA: Diagnosis not present

## 2018-11-03 DIAGNOSIS — R296 Repeated falls: Secondary | ICD-10-CM | POA: Diagnosis not present

## 2018-11-04 DIAGNOSIS — N186 End stage renal disease: Secondary | ICD-10-CM | POA: Diagnosis not present

## 2018-11-04 DIAGNOSIS — Z992 Dependence on renal dialysis: Secondary | ICD-10-CM | POA: Diagnosis not present

## 2018-11-04 DIAGNOSIS — I129 Hypertensive chronic kidney disease with stage 1 through stage 4 chronic kidney disease, or unspecified chronic kidney disease: Secondary | ICD-10-CM | POA: Diagnosis not present

## 2018-11-06 DIAGNOSIS — R69 Illness, unspecified: Secondary | ICD-10-CM | POA: Diagnosis not present

## 2018-11-06 DIAGNOSIS — D631 Anemia in chronic kidney disease: Secondary | ICD-10-CM | POA: Diagnosis not present

## 2018-11-06 DIAGNOSIS — R278 Other lack of coordination: Secondary | ICD-10-CM | POA: Diagnosis not present

## 2018-11-06 DIAGNOSIS — R52 Pain, unspecified: Secondary | ICD-10-CM | POA: Diagnosis not present

## 2018-11-06 DIAGNOSIS — Z992 Dependence on renal dialysis: Secondary | ICD-10-CM | POA: Diagnosis not present

## 2018-11-06 DIAGNOSIS — E1129 Type 2 diabetes mellitus with other diabetic kidney complication: Secondary | ICD-10-CM | POA: Diagnosis not present

## 2018-11-06 DIAGNOSIS — D689 Coagulation defect, unspecified: Secondary | ICD-10-CM | POA: Diagnosis not present

## 2018-11-06 DIAGNOSIS — N2581 Secondary hyperparathyroidism of renal origin: Secondary | ICD-10-CM | POA: Diagnosis not present

## 2018-11-06 DIAGNOSIS — R296 Repeated falls: Secondary | ICD-10-CM | POA: Diagnosis not present

## 2018-11-06 DIAGNOSIS — N186 End stage renal disease: Secondary | ICD-10-CM | POA: Diagnosis not present

## 2018-11-06 DIAGNOSIS — L299 Pruritus, unspecified: Secondary | ICD-10-CM | POA: Diagnosis not present

## 2018-11-07 DIAGNOSIS — I10 Essential (primary) hypertension: Secondary | ICD-10-CM | POA: Diagnosis not present

## 2018-11-07 DIAGNOSIS — R296 Repeated falls: Secondary | ICD-10-CM | POA: Diagnosis not present

## 2018-11-07 DIAGNOSIS — M109 Gout, unspecified: Secondary | ICD-10-CM | POA: Diagnosis not present

## 2018-11-07 DIAGNOSIS — R69 Illness, unspecified: Secondary | ICD-10-CM | POA: Diagnosis not present

## 2018-11-07 DIAGNOSIS — U071 COVID-19: Secondary | ICD-10-CM | POA: Diagnosis not present

## 2018-11-07 DIAGNOSIS — R278 Other lack of coordination: Secondary | ICD-10-CM | POA: Diagnosis not present

## 2018-11-08 DIAGNOSIS — R278 Other lack of coordination: Secondary | ICD-10-CM | POA: Diagnosis not present

## 2018-11-08 DIAGNOSIS — R296 Repeated falls: Secondary | ICD-10-CM | POA: Diagnosis not present

## 2018-11-08 DIAGNOSIS — R69 Illness, unspecified: Secondary | ICD-10-CM | POA: Diagnosis not present

## 2018-11-09 DIAGNOSIS — R278 Other lack of coordination: Secondary | ICD-10-CM | POA: Diagnosis not present

## 2018-11-09 DIAGNOSIS — R69 Illness, unspecified: Secondary | ICD-10-CM | POA: Diagnosis not present

## 2018-11-09 DIAGNOSIS — R296 Repeated falls: Secondary | ICD-10-CM | POA: Diagnosis not present

## 2018-11-10 DIAGNOSIS — R69 Illness, unspecified: Secondary | ICD-10-CM | POA: Diagnosis not present

## 2018-11-10 DIAGNOSIS — R278 Other lack of coordination: Secondary | ICD-10-CM | POA: Diagnosis not present

## 2018-11-10 DIAGNOSIS — R296 Repeated falls: Secondary | ICD-10-CM | POA: Diagnosis not present

## 2018-11-11 DIAGNOSIS — R296 Repeated falls: Secondary | ICD-10-CM | POA: Diagnosis not present

## 2018-11-11 DIAGNOSIS — R69 Illness, unspecified: Secondary | ICD-10-CM | POA: Diagnosis not present

## 2018-11-11 DIAGNOSIS — R278 Other lack of coordination: Secondary | ICD-10-CM | POA: Diagnosis not present

## 2018-11-12 DIAGNOSIS — R69 Illness, unspecified: Secondary | ICD-10-CM | POA: Diagnosis not present

## 2018-11-12 DIAGNOSIS — R296 Repeated falls: Secondary | ICD-10-CM | POA: Diagnosis not present

## 2018-11-12 DIAGNOSIS — R278 Other lack of coordination: Secondary | ICD-10-CM | POA: Diagnosis not present

## 2018-11-15 DIAGNOSIS — F062 Psychotic disorder with delusions due to known physiological condition: Secondary | ICD-10-CM | POA: Diagnosis not present

## 2018-11-15 DIAGNOSIS — M1990 Unspecified osteoarthritis, unspecified site: Secondary | ICD-10-CM | POA: Diagnosis not present

## 2018-11-15 DIAGNOSIS — D649 Anemia, unspecified: Secondary | ICD-10-CM | POA: Diagnosis not present

## 2018-11-15 DIAGNOSIS — R69 Illness, unspecified: Secondary | ICD-10-CM | POA: Diagnosis not present

## 2018-11-15 DIAGNOSIS — R278 Other lack of coordination: Secondary | ICD-10-CM | POA: Diagnosis not present

## 2018-11-15 DIAGNOSIS — R296 Repeated falls: Secondary | ICD-10-CM | POA: Diagnosis not present

## 2018-11-17 DIAGNOSIS — R296 Repeated falls: Secondary | ICD-10-CM | POA: Diagnosis not present

## 2018-11-17 DIAGNOSIS — R278 Other lack of coordination: Secondary | ICD-10-CM | POA: Diagnosis not present

## 2018-11-17 DIAGNOSIS — R69 Illness, unspecified: Secondary | ICD-10-CM | POA: Diagnosis not present

## 2018-11-19 DIAGNOSIS — R278 Other lack of coordination: Secondary | ICD-10-CM | POA: Diagnosis not present

## 2018-11-19 DIAGNOSIS — R69 Illness, unspecified: Secondary | ICD-10-CM | POA: Diagnosis not present

## 2018-11-19 DIAGNOSIS — R296 Repeated falls: Secondary | ICD-10-CM | POA: Diagnosis not present

## 2018-11-20 DIAGNOSIS — Z20828 Contact with and (suspected) exposure to other viral communicable diseases: Secondary | ICD-10-CM | POA: Diagnosis not present

## 2018-11-20 DIAGNOSIS — Z1383 Encounter for screening for respiratory disorder NEC: Secondary | ICD-10-CM | POA: Diagnosis not present

## 2018-11-23 DIAGNOSIS — R296 Repeated falls: Secondary | ICD-10-CM | POA: Diagnosis not present

## 2018-11-23 DIAGNOSIS — R278 Other lack of coordination: Secondary | ICD-10-CM | POA: Diagnosis not present

## 2018-11-23 DIAGNOSIS — R69 Illness, unspecified: Secondary | ICD-10-CM | POA: Diagnosis not present

## 2018-11-24 DIAGNOSIS — R278 Other lack of coordination: Secondary | ICD-10-CM | POA: Diagnosis not present

## 2018-11-24 DIAGNOSIS — R69 Illness, unspecified: Secondary | ICD-10-CM | POA: Diagnosis not present

## 2018-11-24 DIAGNOSIS — R296 Repeated falls: Secondary | ICD-10-CM | POA: Diagnosis not present

## 2018-11-25 DIAGNOSIS — R296 Repeated falls: Secondary | ICD-10-CM | POA: Diagnosis not present

## 2018-11-25 DIAGNOSIS — R278 Other lack of coordination: Secondary | ICD-10-CM | POA: Diagnosis not present

## 2018-11-25 DIAGNOSIS — R69 Illness, unspecified: Secondary | ICD-10-CM | POA: Diagnosis not present

## 2018-11-27 DIAGNOSIS — R69 Illness, unspecified: Secondary | ICD-10-CM | POA: Diagnosis not present

## 2018-11-27 DIAGNOSIS — R296 Repeated falls: Secondary | ICD-10-CM | POA: Diagnosis not present

## 2018-11-27 DIAGNOSIS — Z20828 Contact with and (suspected) exposure to other viral communicable diseases: Secondary | ICD-10-CM | POA: Diagnosis not present

## 2018-11-27 DIAGNOSIS — R278 Other lack of coordination: Secondary | ICD-10-CM | POA: Diagnosis not present

## 2018-11-28 DIAGNOSIS — R296 Repeated falls: Secondary | ICD-10-CM | POA: Diagnosis not present

## 2018-11-28 DIAGNOSIS — R278 Other lack of coordination: Secondary | ICD-10-CM | POA: Diagnosis not present

## 2018-11-28 DIAGNOSIS — R69 Illness, unspecified: Secondary | ICD-10-CM | POA: Diagnosis not present

## 2018-11-29 DIAGNOSIS — R69 Illness, unspecified: Secondary | ICD-10-CM | POA: Diagnosis not present

## 2018-11-29 DIAGNOSIS — R296 Repeated falls: Secondary | ICD-10-CM | POA: Diagnosis not present

## 2018-11-29 DIAGNOSIS — R278 Other lack of coordination: Secondary | ICD-10-CM | POA: Diagnosis not present

## 2018-11-30 DIAGNOSIS — R278 Other lack of coordination: Secondary | ICD-10-CM | POA: Diagnosis not present

## 2018-11-30 DIAGNOSIS — R69 Illness, unspecified: Secondary | ICD-10-CM | POA: Diagnosis not present

## 2018-11-30 DIAGNOSIS — R296 Repeated falls: Secondary | ICD-10-CM | POA: Diagnosis not present

## 2018-12-01 DIAGNOSIS — R296 Repeated falls: Secondary | ICD-10-CM | POA: Diagnosis not present

## 2018-12-01 DIAGNOSIS — R278 Other lack of coordination: Secondary | ICD-10-CM | POA: Diagnosis not present

## 2018-12-01 DIAGNOSIS — R69 Illness, unspecified: Secondary | ICD-10-CM | POA: Diagnosis not present

## 2018-12-03 DIAGNOSIS — R69 Illness, unspecified: Secondary | ICD-10-CM | POA: Diagnosis not present

## 2018-12-03 DIAGNOSIS — R278 Other lack of coordination: Secondary | ICD-10-CM | POA: Diagnosis not present

## 2018-12-03 DIAGNOSIS — R296 Repeated falls: Secondary | ICD-10-CM | POA: Diagnosis not present

## 2018-12-04 DIAGNOSIS — Z1383 Encounter for screening for respiratory disorder NEC: Secondary | ICD-10-CM | POA: Diagnosis not present

## 2018-12-04 DIAGNOSIS — Z992 Dependence on renal dialysis: Secondary | ICD-10-CM | POA: Diagnosis not present

## 2018-12-04 DIAGNOSIS — I129 Hypertensive chronic kidney disease with stage 1 through stage 4 chronic kidney disease, or unspecified chronic kidney disease: Secondary | ICD-10-CM | POA: Diagnosis not present

## 2018-12-04 DIAGNOSIS — Z20828 Contact with and (suspected) exposure to other viral communicable diseases: Secondary | ICD-10-CM | POA: Diagnosis not present

## 2018-12-04 DIAGNOSIS — N186 End stage renal disease: Secondary | ICD-10-CM | POA: Diagnosis not present

## 2018-12-05 DIAGNOSIS — R69 Illness, unspecified: Secondary | ICD-10-CM | POA: Diagnosis not present

## 2018-12-05 DIAGNOSIS — R296 Repeated falls: Secondary | ICD-10-CM | POA: Diagnosis not present

## 2018-12-06 DIAGNOSIS — N2581 Secondary hyperparathyroidism of renal origin: Secondary | ICD-10-CM | POA: Diagnosis not present

## 2018-12-06 DIAGNOSIS — D631 Anemia in chronic kidney disease: Secondary | ICD-10-CM | POA: Diagnosis not present

## 2018-12-06 DIAGNOSIS — R296 Repeated falls: Secondary | ICD-10-CM | POA: Diagnosis not present

## 2018-12-06 DIAGNOSIS — N186 End stage renal disease: Secondary | ICD-10-CM | POA: Diagnosis not present

## 2018-12-06 DIAGNOSIS — E1129 Type 2 diabetes mellitus with other diabetic kidney complication: Secondary | ICD-10-CM | POA: Diagnosis not present

## 2018-12-06 DIAGNOSIS — R69 Illness, unspecified: Secondary | ICD-10-CM | POA: Diagnosis not present

## 2018-12-06 DIAGNOSIS — Z992 Dependence on renal dialysis: Secondary | ICD-10-CM | POA: Diagnosis not present

## 2018-12-06 DIAGNOSIS — D689 Coagulation defect, unspecified: Secondary | ICD-10-CM | POA: Diagnosis not present

## 2018-12-08 DIAGNOSIS — R296 Repeated falls: Secondary | ICD-10-CM | POA: Diagnosis not present

## 2018-12-08 DIAGNOSIS — R69 Illness, unspecified: Secondary | ICD-10-CM | POA: Diagnosis not present

## 2018-12-11 DIAGNOSIS — Z03818 Encounter for observation for suspected exposure to other biological agents ruled out: Secondary | ICD-10-CM | POA: Diagnosis not present

## 2018-12-11 DIAGNOSIS — Z20828 Contact with and (suspected) exposure to other viral communicable diseases: Secondary | ICD-10-CM | POA: Diagnosis not present

## 2018-12-13 DIAGNOSIS — G309 Alzheimer's disease, unspecified: Secondary | ICD-10-CM | POA: Diagnosis not present

## 2018-12-13 DIAGNOSIS — I10 Essential (primary) hypertension: Secondary | ICD-10-CM | POA: Diagnosis not present

## 2018-12-13 DIAGNOSIS — E1129 Type 2 diabetes mellitus with other diabetic kidney complication: Secondary | ICD-10-CM | POA: Diagnosis not present

## 2018-12-13 DIAGNOSIS — N186 End stage renal disease: Secondary | ICD-10-CM | POA: Diagnosis not present

## 2018-12-15 DIAGNOSIS — D649 Anemia, unspecified: Secondary | ICD-10-CM | POA: Diagnosis not present

## 2018-12-15 DIAGNOSIS — F062 Psychotic disorder with delusions due to known physiological condition: Secondary | ICD-10-CM | POA: Diagnosis not present

## 2018-12-15 DIAGNOSIS — M1990 Unspecified osteoarthritis, unspecified site: Secondary | ICD-10-CM | POA: Diagnosis not present

## 2018-12-15 DIAGNOSIS — R69 Illness, unspecified: Secondary | ICD-10-CM | POA: Diagnosis not present

## 2018-12-18 DIAGNOSIS — Z03818 Encounter for observation for suspected exposure to other biological agents ruled out: Secondary | ICD-10-CM | POA: Diagnosis not present

## 2018-12-18 DIAGNOSIS — Z20828 Contact with and (suspected) exposure to other viral communicable diseases: Secondary | ICD-10-CM | POA: Diagnosis not present

## 2018-12-25 DIAGNOSIS — Z03818 Encounter for observation for suspected exposure to other biological agents ruled out: Secondary | ICD-10-CM | POA: Diagnosis not present

## 2018-12-25 DIAGNOSIS — Z20828 Contact with and (suspected) exposure to other viral communicable diseases: Secondary | ICD-10-CM | POA: Diagnosis not present

## 2019-01-01 DIAGNOSIS — Z03818 Encounter for observation for suspected exposure to other biological agents ruled out: Secondary | ICD-10-CM | POA: Diagnosis not present

## 2019-01-01 DIAGNOSIS — Z20828 Contact with and (suspected) exposure to other viral communicable diseases: Secondary | ICD-10-CM | POA: Diagnosis not present

## 2019-01-04 DIAGNOSIS — Z992 Dependence on renal dialysis: Secondary | ICD-10-CM | POA: Diagnosis not present

## 2019-01-04 DIAGNOSIS — I129 Hypertensive chronic kidney disease with stage 1 through stage 4 chronic kidney disease, or unspecified chronic kidney disease: Secondary | ICD-10-CM | POA: Diagnosis not present

## 2019-01-04 DIAGNOSIS — N186 End stage renal disease: Secondary | ICD-10-CM | POA: Diagnosis not present

## 2019-01-08 DIAGNOSIS — D689 Coagulation defect, unspecified: Secondary | ICD-10-CM | POA: Diagnosis not present

## 2019-01-08 DIAGNOSIS — Z20828 Contact with and (suspected) exposure to other viral communicable diseases: Secondary | ICD-10-CM | POA: Diagnosis not present

## 2019-01-08 DIAGNOSIS — E039 Hypothyroidism, unspecified: Secondary | ICD-10-CM | POA: Diagnosis not present

## 2019-01-08 DIAGNOSIS — Z992 Dependence on renal dialysis: Secondary | ICD-10-CM | POA: Diagnosis not present

## 2019-01-08 DIAGNOSIS — Z23 Encounter for immunization: Secondary | ICD-10-CM | POA: Diagnosis not present

## 2019-01-08 DIAGNOSIS — N186 End stage renal disease: Secondary | ICD-10-CM | POA: Diagnosis not present

## 2019-01-08 DIAGNOSIS — E1129 Type 2 diabetes mellitus with other diabetic kidney complication: Secondary | ICD-10-CM | POA: Diagnosis not present

## 2019-01-08 DIAGNOSIS — D631 Anemia in chronic kidney disease: Secondary | ICD-10-CM | POA: Diagnosis not present

## 2019-01-08 DIAGNOSIS — Z03818 Encounter for observation for suspected exposure to other biological agents ruled out: Secondary | ICD-10-CM | POA: Diagnosis not present

## 2019-01-08 DIAGNOSIS — N2581 Secondary hyperparathyroidism of renal origin: Secondary | ICD-10-CM | POA: Diagnosis not present

## 2019-01-10 DIAGNOSIS — E1129 Type 2 diabetes mellitus with other diabetic kidney complication: Secondary | ICD-10-CM | POA: Diagnosis not present

## 2019-01-10 DIAGNOSIS — G309 Alzheimer's disease, unspecified: Secondary | ICD-10-CM | POA: Diagnosis not present

## 2019-01-10 DIAGNOSIS — N189 Chronic kidney disease, unspecified: Secondary | ICD-10-CM | POA: Diagnosis not present

## 2019-01-10 DIAGNOSIS — I10 Essential (primary) hypertension: Secondary | ICD-10-CM | POA: Diagnosis not present

## 2019-01-15 DIAGNOSIS — Z1152 Encounter for screening for COVID-19: Secondary | ICD-10-CM | POA: Diagnosis not present

## 2019-01-15 DIAGNOSIS — Z20828 Contact with and (suspected) exposure to other viral communicable diseases: Secondary | ICD-10-CM | POA: Diagnosis not present

## 2019-01-22 DIAGNOSIS — Z1152 Encounter for screening for COVID-19: Secondary | ICD-10-CM | POA: Diagnosis not present

## 2019-01-22 DIAGNOSIS — Z20822 Contact with and (suspected) exposure to covid-19: Secondary | ICD-10-CM | POA: Diagnosis not present

## 2019-01-24 DIAGNOSIS — R69 Illness, unspecified: Secondary | ICD-10-CM | POA: Diagnosis not present

## 2019-01-24 DIAGNOSIS — F062 Psychotic disorder with delusions due to known physiological condition: Secondary | ICD-10-CM | POA: Diagnosis not present

## 2019-01-29 DIAGNOSIS — Z1152 Encounter for screening for COVID-19: Secondary | ICD-10-CM | POA: Diagnosis not present

## 2019-01-29 DIAGNOSIS — Z20822 Contact with and (suspected) exposure to covid-19: Secondary | ICD-10-CM | POA: Diagnosis not present

## 2019-02-04 DIAGNOSIS — Z992 Dependence on renal dialysis: Secondary | ICD-10-CM | POA: Diagnosis not present

## 2019-02-04 DIAGNOSIS — I129 Hypertensive chronic kidney disease with stage 1 through stage 4 chronic kidney disease, or unspecified chronic kidney disease: Secondary | ICD-10-CM | POA: Diagnosis not present

## 2019-02-04 DIAGNOSIS — N186 End stage renal disease: Secondary | ICD-10-CM | POA: Diagnosis not present

## 2019-02-05 DIAGNOSIS — N186 End stage renal disease: Secondary | ICD-10-CM | POA: Diagnosis not present

## 2019-02-05 DIAGNOSIS — Z23 Encounter for immunization: Secondary | ICD-10-CM | POA: Diagnosis not present

## 2019-02-05 DIAGNOSIS — D689 Coagulation defect, unspecified: Secondary | ICD-10-CM | POA: Diagnosis not present

## 2019-02-05 DIAGNOSIS — E1129 Type 2 diabetes mellitus with other diabetic kidney complication: Secondary | ICD-10-CM | POA: Diagnosis not present

## 2019-02-05 DIAGNOSIS — Z20822 Contact with and (suspected) exposure to covid-19: Secondary | ICD-10-CM | POA: Diagnosis not present

## 2019-02-05 DIAGNOSIS — Z1152 Encounter for screening for COVID-19: Secondary | ICD-10-CM | POA: Diagnosis not present

## 2019-02-05 DIAGNOSIS — Z992 Dependence on renal dialysis: Secondary | ICD-10-CM | POA: Diagnosis not present

## 2019-02-05 DIAGNOSIS — R197 Diarrhea, unspecified: Secondary | ICD-10-CM | POA: Diagnosis not present

## 2019-02-05 DIAGNOSIS — N2581 Secondary hyperparathyroidism of renal origin: Secondary | ICD-10-CM | POA: Diagnosis not present

## 2019-02-06 DIAGNOSIS — E785 Hyperlipidemia, unspecified: Secondary | ICD-10-CM | POA: Diagnosis not present

## 2019-02-07 DIAGNOSIS — E1129 Type 2 diabetes mellitus with other diabetic kidney complication: Secondary | ICD-10-CM | POA: Diagnosis not present

## 2019-02-07 DIAGNOSIS — M109 Gout, unspecified: Secondary | ICD-10-CM | POA: Diagnosis not present

## 2019-02-07 DIAGNOSIS — I10 Essential (primary) hypertension: Secondary | ICD-10-CM | POA: Diagnosis not present

## 2019-02-07 DIAGNOSIS — G309 Alzheimer's disease, unspecified: Secondary | ICD-10-CM | POA: Diagnosis not present

## 2019-02-07 DIAGNOSIS — Z992 Dependence on renal dialysis: Secondary | ICD-10-CM | POA: Diagnosis not present

## 2019-02-09 DIAGNOSIS — R69 Illness, unspecified: Secondary | ICD-10-CM | POA: Diagnosis not present

## 2019-02-09 DIAGNOSIS — F062 Psychotic disorder with delusions due to known physiological condition: Secondary | ICD-10-CM | POA: Diagnosis not present

## 2019-02-12 DIAGNOSIS — Z20822 Contact with and (suspected) exposure to covid-19: Secondary | ICD-10-CM | POA: Diagnosis not present

## 2019-02-12 DIAGNOSIS — Z1152 Encounter for screening for COVID-19: Secondary | ICD-10-CM | POA: Diagnosis not present

## 2019-02-23 DIAGNOSIS — I517 Cardiomegaly: Secondary | ICD-10-CM | POA: Diagnosis not present

## 2019-02-26 DIAGNOSIS — Z20822 Contact with and (suspected) exposure to covid-19: Secondary | ICD-10-CM | POA: Diagnosis not present

## 2019-02-26 DIAGNOSIS — Z1152 Encounter for screening for COVID-19: Secondary | ICD-10-CM | POA: Diagnosis not present

## 2019-02-27 DIAGNOSIS — N186 End stage renal disease: Secondary | ICD-10-CM | POA: Diagnosis not present

## 2019-02-27 DIAGNOSIS — M109 Gout, unspecified: Secondary | ICD-10-CM | POA: Diagnosis not present

## 2019-02-27 DIAGNOSIS — Z992 Dependence on renal dialysis: Secondary | ICD-10-CM | POA: Diagnosis not present

## 2019-02-27 DIAGNOSIS — R69 Illness, unspecified: Secondary | ICD-10-CM | POA: Diagnosis not present

## 2019-03-01 DIAGNOSIS — E1151 Type 2 diabetes mellitus with diabetic peripheral angiopathy without gangrene: Secondary | ICD-10-CM | POA: Diagnosis not present

## 2019-03-01 DIAGNOSIS — L602 Onychogryphosis: Secondary | ICD-10-CM | POA: Diagnosis not present

## 2019-03-01 DIAGNOSIS — Z7984 Long term (current) use of oral hypoglycemic drugs: Secondary | ICD-10-CM | POA: Diagnosis not present

## 2019-03-04 DIAGNOSIS — I129 Hypertensive chronic kidney disease with stage 1 through stage 4 chronic kidney disease, or unspecified chronic kidney disease: Secondary | ICD-10-CM | POA: Diagnosis not present

## 2019-03-04 DIAGNOSIS — N186 End stage renal disease: Secondary | ICD-10-CM | POA: Diagnosis not present

## 2019-03-04 DIAGNOSIS — Z992 Dependence on renal dialysis: Secondary | ICD-10-CM | POA: Diagnosis not present

## 2019-03-05 DIAGNOSIS — Z992 Dependence on renal dialysis: Secondary | ICD-10-CM | POA: Diagnosis not present

## 2019-03-05 DIAGNOSIS — D509 Iron deficiency anemia, unspecified: Secondary | ICD-10-CM | POA: Diagnosis not present

## 2019-03-05 DIAGNOSIS — N186 End stage renal disease: Secondary | ICD-10-CM | POA: Diagnosis not present

## 2019-03-05 DIAGNOSIS — D631 Anemia in chronic kidney disease: Secondary | ICD-10-CM | POA: Diagnosis not present

## 2019-03-05 DIAGNOSIS — N2581 Secondary hyperparathyroidism of renal origin: Secondary | ICD-10-CM | POA: Diagnosis not present

## 2019-03-05 DIAGNOSIS — D689 Coagulation defect, unspecified: Secondary | ICD-10-CM | POA: Diagnosis not present

## 2019-03-05 DIAGNOSIS — E1129 Type 2 diabetes mellitus with other diabetic kidney complication: Secondary | ICD-10-CM | POA: Diagnosis not present

## 2019-03-07 DIAGNOSIS — M6281 Muscle weakness (generalized): Secondary | ICD-10-CM | POA: Diagnosis not present

## 2019-03-07 DIAGNOSIS — G309 Alzheimer's disease, unspecified: Secondary | ICD-10-CM | POA: Diagnosis not present

## 2019-03-08 DIAGNOSIS — G309 Alzheimer's disease, unspecified: Secondary | ICD-10-CM | POA: Diagnosis not present

## 2019-03-08 DIAGNOSIS — M6281 Muscle weakness (generalized): Secondary | ICD-10-CM | POA: Diagnosis not present

## 2019-03-09 DIAGNOSIS — M6281 Muscle weakness (generalized): Secondary | ICD-10-CM | POA: Diagnosis not present

## 2019-03-09 DIAGNOSIS — G309 Alzheimer's disease, unspecified: Secondary | ICD-10-CM | POA: Diagnosis not present

## 2019-03-12 DIAGNOSIS — M6281 Muscle weakness (generalized): Secondary | ICD-10-CM | POA: Diagnosis not present

## 2019-03-12 DIAGNOSIS — G309 Alzheimer's disease, unspecified: Secondary | ICD-10-CM | POA: Diagnosis not present

## 2019-03-13 DIAGNOSIS — G309 Alzheimer's disease, unspecified: Secondary | ICD-10-CM | POA: Diagnosis not present

## 2019-03-13 DIAGNOSIS — M6281 Muscle weakness (generalized): Secondary | ICD-10-CM | POA: Diagnosis not present

## 2019-03-14 DIAGNOSIS — M6281 Muscle weakness (generalized): Secondary | ICD-10-CM | POA: Diagnosis not present

## 2019-03-14 DIAGNOSIS — G309 Alzheimer's disease, unspecified: Secondary | ICD-10-CM | POA: Diagnosis not present

## 2019-03-15 DIAGNOSIS — M109 Gout, unspecified: Secondary | ICD-10-CM | POA: Diagnosis not present

## 2019-03-15 DIAGNOSIS — G309 Alzheimer's disease, unspecified: Secondary | ICD-10-CM | POA: Diagnosis not present

## 2019-03-15 DIAGNOSIS — R69 Illness, unspecified: Secondary | ICD-10-CM | POA: Diagnosis not present

## 2019-03-15 DIAGNOSIS — M6281 Muscle weakness (generalized): Secondary | ICD-10-CM | POA: Diagnosis not present

## 2019-03-15 DIAGNOSIS — I739 Peripheral vascular disease, unspecified: Secondary | ICD-10-CM | POA: Diagnosis not present

## 2019-03-16 DIAGNOSIS — G309 Alzheimer's disease, unspecified: Secondary | ICD-10-CM | POA: Diagnosis not present

## 2019-03-16 DIAGNOSIS — M6281 Muscle weakness (generalized): Secondary | ICD-10-CM | POA: Diagnosis not present

## 2019-03-20 DIAGNOSIS — M6281 Muscle weakness (generalized): Secondary | ICD-10-CM | POA: Diagnosis not present

## 2019-03-20 DIAGNOSIS — G309 Alzheimer's disease, unspecified: Secondary | ICD-10-CM | POA: Diagnosis not present

## 2019-03-21 DIAGNOSIS — G309 Alzheimer's disease, unspecified: Secondary | ICD-10-CM | POA: Diagnosis not present

## 2019-03-21 DIAGNOSIS — M6281 Muscle weakness (generalized): Secondary | ICD-10-CM | POA: Diagnosis not present

## 2019-03-22 DIAGNOSIS — M6281 Muscle weakness (generalized): Secondary | ICD-10-CM | POA: Diagnosis not present

## 2019-03-22 DIAGNOSIS — G309 Alzheimer's disease, unspecified: Secondary | ICD-10-CM | POA: Diagnosis not present

## 2019-03-23 DIAGNOSIS — M6281 Muscle weakness (generalized): Secondary | ICD-10-CM | POA: Diagnosis not present

## 2019-03-23 DIAGNOSIS — G309 Alzheimer's disease, unspecified: Secondary | ICD-10-CM | POA: Diagnosis not present

## 2019-03-26 DIAGNOSIS — M6281 Muscle weakness (generalized): Secondary | ICD-10-CM | POA: Diagnosis not present

## 2019-03-26 DIAGNOSIS — G309 Alzheimer's disease, unspecified: Secondary | ICD-10-CM | POA: Diagnosis not present

## 2019-03-27 DIAGNOSIS — G309 Alzheimer's disease, unspecified: Secondary | ICD-10-CM | POA: Diagnosis not present

## 2019-03-27 DIAGNOSIS — M6281 Muscle weakness (generalized): Secondary | ICD-10-CM | POA: Diagnosis not present

## 2019-03-28 DIAGNOSIS — M6281 Muscle weakness (generalized): Secondary | ICD-10-CM | POA: Diagnosis not present

## 2019-03-28 DIAGNOSIS — G309 Alzheimer's disease, unspecified: Secondary | ICD-10-CM | POA: Diagnosis not present

## 2019-03-29 DIAGNOSIS — M6281 Muscle weakness (generalized): Secondary | ICD-10-CM | POA: Diagnosis not present

## 2019-03-29 DIAGNOSIS — G309 Alzheimer's disease, unspecified: Secondary | ICD-10-CM | POA: Diagnosis not present

## 2019-03-30 DIAGNOSIS — M6281 Muscle weakness (generalized): Secondary | ICD-10-CM | POA: Diagnosis not present

## 2019-03-30 DIAGNOSIS — G309 Alzheimer's disease, unspecified: Secondary | ICD-10-CM | POA: Diagnosis not present

## 2019-04-02 DIAGNOSIS — M6281 Muscle weakness (generalized): Secondary | ICD-10-CM | POA: Diagnosis not present

## 2019-04-02 DIAGNOSIS — G309 Alzheimer's disease, unspecified: Secondary | ICD-10-CM | POA: Diagnosis not present

## 2019-04-04 DIAGNOSIS — Z992 Dependence on renal dialysis: Secondary | ICD-10-CM | POA: Diagnosis not present

## 2019-04-04 DIAGNOSIS — I129 Hypertensive chronic kidney disease with stage 1 through stage 4 chronic kidney disease, or unspecified chronic kidney disease: Secondary | ICD-10-CM | POA: Diagnosis not present

## 2019-04-04 DIAGNOSIS — N186 End stage renal disease: Secondary | ICD-10-CM | POA: Diagnosis not present

## 2019-04-04 DIAGNOSIS — I739 Peripheral vascular disease, unspecified: Secondary | ICD-10-CM | POA: Diagnosis not present

## 2019-04-04 DIAGNOSIS — G309 Alzheimer's disease, unspecified: Secondary | ICD-10-CM | POA: Diagnosis not present

## 2019-04-04 DIAGNOSIS — E119 Type 2 diabetes mellitus without complications: Secondary | ICD-10-CM | POA: Diagnosis not present

## 2019-04-06 DIAGNOSIS — E1129 Type 2 diabetes mellitus with other diabetic kidney complication: Secondary | ICD-10-CM | POA: Diagnosis not present

## 2019-04-06 DIAGNOSIS — D631 Anemia in chronic kidney disease: Secondary | ICD-10-CM | POA: Diagnosis not present

## 2019-04-06 DIAGNOSIS — N186 End stage renal disease: Secondary | ICD-10-CM | POA: Diagnosis not present

## 2019-04-06 DIAGNOSIS — D689 Coagulation defect, unspecified: Secondary | ICD-10-CM | POA: Diagnosis not present

## 2019-04-06 DIAGNOSIS — E039 Hypothyroidism, unspecified: Secondary | ICD-10-CM | POA: Diagnosis not present

## 2019-04-06 DIAGNOSIS — D509 Iron deficiency anemia, unspecified: Secondary | ICD-10-CM | POA: Diagnosis not present

## 2019-04-06 DIAGNOSIS — N2581 Secondary hyperparathyroidism of renal origin: Secondary | ICD-10-CM | POA: Diagnosis not present

## 2019-04-06 DIAGNOSIS — Z992 Dependence on renal dialysis: Secondary | ICD-10-CM | POA: Diagnosis not present

## 2019-04-16 DIAGNOSIS — J45909 Unspecified asthma, uncomplicated: Secondary | ICD-10-CM | POA: Diagnosis not present

## 2019-04-16 DIAGNOSIS — I739 Peripheral vascular disease, unspecified: Secondary | ICD-10-CM | POA: Diagnosis not present

## 2019-04-16 DIAGNOSIS — N400 Enlarged prostate without lower urinary tract symptoms: Secondary | ICD-10-CM | POA: Diagnosis not present

## 2019-04-16 DIAGNOSIS — M109 Gout, unspecified: Secondary | ICD-10-CM | POA: Diagnosis not present

## 2019-04-18 DIAGNOSIS — E1151 Type 2 diabetes mellitus with diabetic peripheral angiopathy without gangrene: Secondary | ICD-10-CM | POA: Diagnosis not present

## 2019-04-18 DIAGNOSIS — E785 Hyperlipidemia, unspecified: Secondary | ICD-10-CM | POA: Diagnosis not present

## 2019-04-18 DIAGNOSIS — I120 Hypertensive chronic kidney disease with stage 5 chronic kidney disease or end stage renal disease: Secondary | ICD-10-CM | POA: Diagnosis not present

## 2019-04-24 ENCOUNTER — Other Ambulatory Visit: Payer: Self-pay

## 2019-04-24 ENCOUNTER — Emergency Department (HOSPITAL_COMMUNITY)
Admission: EM | Admit: 2019-04-24 | Discharge: 2019-04-24 | Disposition: A | Discharge status: Home / Self Care | Payer: Medicare HMO | Attending: Emergency Medicine | Admitting: Emergency Medicine

## 2019-04-24 ENCOUNTER — Emergency Department (HOSPITAL_COMMUNITY): Payer: Medicare HMO

## 2019-04-24 DIAGNOSIS — R69 Illness, unspecified: Secondary | ICD-10-CM | POA: Diagnosis not present

## 2019-04-24 DIAGNOSIS — Z79899 Other long term (current) drug therapy: Secondary | ICD-10-CM | POA: Diagnosis not present

## 2019-04-24 DIAGNOSIS — R410 Disorientation, unspecified: Secondary | ICD-10-CM | POA: Diagnosis not present

## 2019-04-24 DIAGNOSIS — Z87891 Personal history of nicotine dependence: Secondary | ICD-10-CM | POA: Diagnosis not present

## 2019-04-24 DIAGNOSIS — E1122 Type 2 diabetes mellitus with diabetic chronic kidney disease: Secondary | ICD-10-CM | POA: Insufficient documentation

## 2019-04-24 DIAGNOSIS — I120 Hypertensive chronic kidney disease with stage 5 chronic kidney disease or end stage renal disease: Secondary | ICD-10-CM | POA: Insufficient documentation

## 2019-04-24 DIAGNOSIS — R279 Unspecified lack of coordination: Secondary | ICD-10-CM | POA: Diagnosis not present

## 2019-04-24 DIAGNOSIS — S0181XA Laceration without foreign body of other part of head, initial encounter: Secondary | ICD-10-CM

## 2019-04-24 DIAGNOSIS — S01112A Laceration without foreign body of left eyelid and periocular area, initial encounter: Secondary | ICD-10-CM | POA: Diagnosis not present

## 2019-04-24 DIAGNOSIS — Y999 Unspecified external cause status: Secondary | ICD-10-CM | POA: Insufficient documentation

## 2019-04-24 DIAGNOSIS — Z23 Encounter for immunization: Secondary | ICD-10-CM | POA: Insufficient documentation

## 2019-04-24 DIAGNOSIS — Z7901 Long term (current) use of anticoagulants: Secondary | ICD-10-CM | POA: Diagnosis not present

## 2019-04-24 DIAGNOSIS — Z743 Need for continuous supervision: Secondary | ICD-10-CM | POA: Diagnosis not present

## 2019-04-24 DIAGNOSIS — N186 End stage renal disease: Secondary | ICD-10-CM | POA: Insufficient documentation

## 2019-04-24 DIAGNOSIS — I10 Essential (primary) hypertension: Secondary | ICD-10-CM | POA: Diagnosis not present

## 2019-04-24 DIAGNOSIS — W19XXXA Unspecified fall, initial encounter: Secondary | ICD-10-CM | POA: Diagnosis not present

## 2019-04-24 DIAGNOSIS — F0390 Unspecified dementia without behavioral disturbance: Secondary | ICD-10-CM | POA: Diagnosis not present

## 2019-04-24 DIAGNOSIS — Y92129 Unspecified place in nursing home as the place of occurrence of the external cause: Secondary | ICD-10-CM | POA: Diagnosis not present

## 2019-04-24 DIAGNOSIS — S0101XA Laceration without foreign body of scalp, initial encounter: Secondary | ICD-10-CM | POA: Diagnosis not present

## 2019-04-24 DIAGNOSIS — S0091XA Abrasion of unspecified part of head, initial encounter: Secondary | ICD-10-CM | POA: Diagnosis not present

## 2019-04-24 DIAGNOSIS — Y939 Activity, unspecified: Secondary | ICD-10-CM | POA: Diagnosis not present

## 2019-04-24 MED ORDER — TETANUS-DIPHTH-ACELL PERTUSSIS 5-2.5-18.5 LF-MCG/0.5 IM SUSP
0.5000 mL | Freq: Once | INTRAMUSCULAR | Status: AC
  Administered 2019-04-24: 0.5 mL via INTRAMUSCULAR
  Filled 2019-04-24: qty 0.5

## 2019-04-24 MED ORDER — LIDOCAINE-EPINEPHRINE (PF) 1 %-1:200000 IJ SOLN
10.0000 mL | Freq: Once | INTRAMUSCULAR | Status: AC
  Administered 2019-04-24: 10 mL
  Filled 2019-04-24: qty 30

## 2019-04-24 NOTE — ED Provider Notes (Addendum)
[facility] Provider Note   CSN: [id number] Arrival date & time: 04/24/19  0131     History Chief Complaint  Patient presents with  . Fall    laceration    Miguel Tucker is a 86 y.o. male.  Patient sent to the emergency department from nursing home for evaluation after a fall.  Mechanism of fall is unclear.  Patient was found on the ground with a contusion and laceration to the left eyebrow.  Patient with baseline confusion, does not remember falling.  At arrival he has no complaints. Level V Caveat due to dementia.        Past Medical History:  Diagnosis Date  . Asthma   . Chronic kidney disease    on dialysis  . Chronic knee pain   . Dementia ([place])   . Dementia ([place])   . Diabetes mellitus    type 2  . ESRD (end stage renal disease) ([place])   . Frequent urination at night   . Gout   . Hard of hearing   . High cholesterol   . Hypertension   . Hypokalemia   . Radicular pain of left lower extremity     Patient Active Problem List   Diagnosis Date Noted  . Gait instability 01/12/2018  . Acute ischemic stroke ([place]) 12/13/2017  . Weakness of left upper extremity 12/12/2017  . Hypokalemia 12/12/2017  . ESRD (end stage renal disease) ([place]) 12/12/2017  . HLD (hyperlipidemia) 12/12/2017  . Gout 12/12/2017  . BPH (benign prostatic hyperplasia) 12/12/2017  . Asthma 12/12/2017  . GERD (gastroesophageal reflux disease) 12/12/2017  . Symptomatic anemia 11/25/2017  . Generalized weakness 09/06/2017  . HTN (hypertension) 09/06/2017  . Anemia, chronic disease 09/06/2017  . Dementia ([place]) 09/06/2017  . Onychomycosis 04/11/2017  . Hammer toe 04/11/2017  . Diabetes mellitus ([place]) 04/11/2017  . Osteoarthritis of knee 02/11/2017  . Difficulty in walking(719.7) 12/09/2011  . Radicular pain of left lower extremity 12/09/2011  . Wound healing, delayed 11/06/2010    Past Surgical History:  Procedure Laterality Date  . APPENDECTOMY    . AV  FISTULA PLACEMENT Left 10/18/2017   Procedure: INSERTION OF ARTERIOVENOUS (AV) GORE-TEX GRAFT LEFT UPPER  ARM;  Surgeon: Waynetta Sandy, MD;  Location: [facility];  Service: Vascular;  Laterality: Left;  . BASCILIC VEIN TRANSPOSITION Left 09/09/2015   Procedure: FIRST STAGE BRACHIAL VEIN TRANSPOSITION LEFT ARM;  Surgeon: Elam Dutch, MD;  Location: [facility];  Service: Vascular;  Laterality: Left;  . BASCILIC VEIN TRANSPOSITION Left 02/20/2016   Procedure: LEFT ARM SECOND STAGE BASILIC VEIN TRANSPOSITION;  Surgeon: Rosetta Posner, MD;  Location: [facility];  Service: Vascular;  Laterality: Left;  . ESOPHAGOGASTRODUODENOSCOPY N/A 11/27/2017   Procedure: ESOPHAGOGASTRODUODENOSCOPY (EGD);  Surgeon: Rogene Houston, MD;  [facility];  Service: Endoscopy;  Laterality: N/A;  . INSERTION OF DIALYSIS CATHETER Right 10/18/2017   Procedure: INSERTION OF TUNNELED  DIALYSIS CATHETER RIGHT INTERNAL JUGULAR;  Surgeon: Waynetta Sandy, MD;  Location: [facility];  Service: Vascular;  Laterality: Right;  . OTHER SURGICAL HISTORY     testicular tumor- benign   . ROTATOR CUFF REPAIR Right   . WOUND DEBRIDEMENT  06/01/2011   Procedure: DEBRIDEMENT ABDOMINAL WOUND;  Surgeon: [name], MD;  Location: [facility];  Service: General;  Laterality: N/A;  Remove Sutures        No family history on file.  Social History   Tobacco Use  . Smoking status:  Former Smoker    Quit date: 11/06/1975    Years since quitting: 43.4  . Smokeless tobacco: Never Used  Substance Use Topics  . Alcohol use: No  . Drug use: No    Home Medications Prior to Admission medications   Medication Sig Start Date End Date Taking? Authorizing Provider  allopurinol (ZYLOPRIM) 100 MG tablet Take 100 mg by mouth daily.  07/23/15   [provider]  amLODipine (NORVASC) 10 MG tablet Take 10 mg by mouth daily.    [provider]  amLODipine (NORVASC) 5 MG tablet Take 1 tablet (5 mg total) by mouth  daily. Patient not taking: Reported on 08/19/2018 11/29/17   Murlean Iba, MD  apixaban (ELIQUIS) 2.5 MG TABS tablet Take 5 mg by mouth once. 08/19/18 08/19/18  [provider]  aspirin EC 81 MG tablet Take 1 tablet (81 mg total) by mouth every morning. 12/03/17   Johnson, Clanford L, MD  atorvastatin (LIPITOR) 40 MG tablet TAKE 1 TABLET BY MOUTH ONCE DAILY AT 6PM Patient taking differently: Take 40 mg by mouth at bedtime.  02/21/18   [name], MD  calcitRIOL (ROCALTROL) 0.25 MCG capsule Take 1 capsule (0.25 mcg total) by mouth every Monday, Wednesday, and Friday with hemodialysis. Patient not taking: Reported on 08/19/2018 12/16/17   Barton Dubois, MD  colchicine 0.6 MG tablet TAKE 1 TABLET BY MOUTH ONCE DAILY. Patient taking differently: Take 0.3 mg by mouth daily.  02/21/18   [name], MD  donepezil (ARICEPT) 10 MG tablet Take 10 mg by mouth at bedtime.    [provider]  donepezil (ARICEPT) 5 MG tablet Take 2 tablets (10 mg total) by mouth at bedtime. Patient not taking: Reported on 08/19/2018 12/14/17   Barton Dubois, MD  enoxaparin (LOVENOX) 30 MG/0.3ML injection Inject 30 mg into the skin See admin instructions. Inject 30 mg into the skin every morning at 9 AM to prevent blood clotting (for 21 days) 08/20/18 09/09/18  [provider]  ferrous sulfate 325 (65 FE) MG tablet Take 325 mg by mouth daily with breakfast.    [provider]  finasteride (PROSCAR) 5 MG tablet Take 5 mg by mouth daily.    [provider]  loratadine (CLARITIN) 10 MG tablet Take 10 mg by mouth daily.    [provider]  LORazepam (ATIVAN) 0.5 MG tablet Take 0.5 mg by mouth See admin instructions. Take 0.5 mg by mouth in the morning on Mon/Wed/Fri for anxiety- prior to dialysis    [provider]  montelukast (SINGULAIR) 10 MG tablet Take 10 mg by mouth at bedtime.    [provider]  multivitamin (RENA-VIT) TABS tablet Take 1  tablet by mouth at bedtime.    [provider]  sevelamer (RENAGEL) 800 MG tablet Take 800 mg by mouth 3 (three) times daily.    [provider]    Allergies    Penicillins  Review of Systems   Review of Systems  Unable to perform ROS: Dementia    Physical Exam Updated Vital Signs BP 128/71 (BP Location: Right Arm)   Pulse 92   Temp 98.2 F (36.8 C) (Oral)   Resp 18   Ht 5\' 5"  (1.651 m)   Wt 68 kg   SpO2 98%   BMI 24.95 kg/m   Physical Exam Vitals and nursing note reviewed.  Constitutional:      General: He is not in acute distress.    Appearance: Normal appearance. He  is well-developed.  HENT:     Head: Normocephalic. Contusion (left eyebrow) and laceration (left eyebrow) present.     Right Ear: Hearing normal.     Left Ear: Hearing normal.     Nose: Nose normal.  Eyes:     Conjunctiva/sclera: Conjunctivae normal.     Pupils: Pupils are equal, round, and reactive to light.  Cardiovascular:     Rate and Rhythm: Regular rhythm.     Heart sounds: S1 normal and S2 normal. No murmur. No friction rub. No gallop.   Pulmonary:     Effort: Pulmonary effort is normal. No respiratory distress.     Breath sounds: Normal breath sounds.  Chest:     Chest wall: No tenderness.  Abdominal:     General: Bowel sounds are normal.     Palpations: Abdomen is soft.     Tenderness: There is no abdominal tenderness. There is no guarding or rebound. Negative signs include Murphy's sign and McBurney's sign.     Hernia: No hernia is present.  Musculoskeletal:        General: Normal range of motion.     Cervical back: Normal range of motion and neck supple.     Comments: No evidence of extremity trauma, upper extremity exam is normal.  Lower extremity exam also normal without deformity, moves both hips without pain.  Skin:    General: Skin is warm and dry.     Findings: No rash.  Neurological:     Mental Status: He is alert. Mental status is at baseline. He is  confused.     GCS: GCS eye subscore is 4. GCS verbal subscore is 4. GCS motor subscore is 6.     Cranial Nerves: No cranial nerve deficit.     Sensory: No sensory deficit.     Coordination: Coordination normal.  Psychiatric:        Speech: Speech normal.        Behavior: Behavior normal.        Thought Content: Thought content normal.     ED Results / Procedures / Treatments   Labs (all labs ordered are listed, but only abnormal results are displayed) Labs Reviewed - No data to display  EKG EKG Interpretation  Date/Time:  Tuesday April 24 2019 01:41:20 EDT Ventricular Rate:  93 PR Interval:    QRS Duration: 95 QT Interval:  372 QTC Calculation: 463 R Axis:   -62 Text Interpretation: Sinus rhythm Left anterior fascicular block Left ventricular hypertrophy Anterior infarct, old No significant change since last tracing Confirmed by Orpah Greek [phone]) on 04/24/2019 1:53:40 AM   Radiology CT HEAD WO CONTRAST  Result Date: 04/24/2019 CLINICAL DATA:  86 year old post fall. Laceration above left eye. Dementia patient post unwitnessed fall. EXAM: CT HEAD WITHOUT CONTRAST TECHNIQUE: Contiguous axial images were obtained from the base of the skull through the vertex without intravenous contrast. COMPARISON:  Head CT and brain MRI December 2019 FINDINGS: Brain: No intracranial hemorrhage, mass effect, or midline shift. Stable degree of atrophy with bifrontal predominance. No hydrocephalus. The basilar cisterns are patent. Unchanged chronic small vessel ischemia. No evidence of territorial infarct or acute ischemia. No extra-axial or intracranial fluid collection. Vascular: Atherosclerosis of skullbase vasculature without hyperdense vessel or abnormal calcification. Skull: No fracture or focal lesion. Sinuses/Orbits: Small left supraorbital scalp laceration. No evidence of orbital fracture. No other acute finding. Other: None. IMPRESSION: 1. Small left supraorbital scalp laceration. No  acute intracranial abnormality. No skull fracture. 2. Stable  atrophy and chronic small vessel ischemia. Electronically Signed   By: Keith Rake [name]   On: 04/24/2019 02:07   CT CERVICAL SPINE WO CONTRAST  Result Date: 04/24/2019 CLINICAL DATA:  86 year old post unwitnessed fall with laceration above left eye. EXAM: CT CERVICAL SPINE WITHOUT CONTRAST TECHNIQUE: Multidetector CT imaging of the cervical spine was performed without intravenous contrast. Multiplanar CT image reconstructions were also generated. COMPARISON:  Cervical spine CT 10/30/2010 FINDINGS: Alignment: Reversal of normal lordosis, also seen previously but slightly more pronounced than on prior exam. No traumatic subluxation. Skull base and vertebrae: No acute fracture. No evidence of focal lesion. Degenerative pannus at C1-C2. Soft tissues and spinal canal: No prevertebral fluid or swelling. No visible canal hematoma. Disc levels: Diffuse degenerative disc disease. Mild multilevel facet hypertrophy. Upper chest: No acute findings. Other: Carotid calcifications. IMPRESSION: Multilevel degenerative disc disease and facet hypertrophy. No acute fracture or traumatic subluxation of the cervical spine. Electronically Signed   By: Keith Rake [name]   On: 04/24/2019 02:10    Procedures .Marland KitchenLaceration Repair  Date/Time: 04/24/2019 2:23 AM Performed by: Orpah Greek, MD Authorized by: Orpah Greek, MD   Consent:    Consent obtained:  Verbal and emergent situation   Consent given by:  Patient Universal protocol:    Procedure explained and questions answered to patient or proxy's satisfaction: yes     Relevant documents present and verified: yes     Test results available and properly labeled: yes     Imaging studies available: yes     Required blood products, implants, devices, and special equipment available: yes     Site/side marked: yes     Immediately prior to procedure, a time out was called: yes      Patient identity confirmed:  Arm band Anesthesia (see MAR for exact dosages):    Anesthesia method:  Local infiltration   Local anesthetic:  Lidocaine 1% WITH epi Laceration details:    Location:  Face   Face location:  L eyebrow   Length (cm):  2 Repair type:    Repair type:  Simple Pre-procedure details:    Preparation:  Patient was prepped and draped in usual sterile fashion and imaging obtained to evaluate for foreign bodies Exploration:    Hemostasis achieved with:  Epinephrine and direct pressure   Contaminated: no   Treatment:    Area cleansed with:  Hibiclens   Amount of cleaning:  Standard   Irrigation solution:  Sterile saline   Irrigation method:  Syringe Skin repair:    Repair method:  Sutures   Suture size:  5-0   Suture material:  Fast-absorbing gut   Number of sutures:  3 Approximation:    Approximation:  Close Post-procedure details:    Dressing:  Open (no dressing)   Patient tolerance of procedure:  Tolerated well, no immediate complications   (including critical care time)  Medications Ordered in ED Medications  Tdap (BOOSTRIX) injection 0.5 mL (0.5 mLs Intramuscular Given 04/24/19 0206)  lidocaine-EPINEPHrine (XYLOCAINE-EPINEPHrine) 1 %-1:200000 (PF) injection 10 mL (10 mLs Infiltration Given 04/24/19 0205)    ED Course  I have reviewed the triage vital signs and the nursing notes.  Pertinent labs & imaging results that were available during my care of the patient were reviewed by me and considered in my medical decision making (see chart for details).    MDM Rules/Calculators/A&P  Patient presents to the emergency department for evaluation after an unwitnessed fall.  Patient sent to the ER from nursing home.  Patient has baseline dementia, cannot tell me any more about what happened.  He does have a contusion and laceration of the left eyebrow, no other findings.  CT head and cervical spine negative.  Wound was repaired with  fast-absorbing gut sutures.  Attempts to contact patient's son, Trinten Boudoin, update on condition were unsuccessful.  Phone went straight to voicemail.  Will return to nursing home.  Final Clinical Impression(s) / ED Diagnoses Final diagnoses:  Facial laceration, initial encounter    Rx / DC Orders ED Discharge Orders    None       Lizandro Spellman, Gwenyth Allegra, MD 04/24/19 [id number]    Orpah Greek, MD 04/24/19 [phone]

## 2019-04-24 NOTE — Discharge Instructions (Addendum)
Sutures will dissolve, do not need to be removed.

## 2019-04-24 NOTE — ED Triage Notes (Signed)
Patient brought in by EMS from [facility] for a fall. Patient has a laceration above the left eye. Fall was unwitnessed and patient was found by staff. Saff unsure how the patient fell. Patient has a hx of dementia and is a dialysis patient.

## 2019-04-24 NOTE — ED Notes (Signed)
Patient complains of left-sided tenderness upon touch.

## 2019-04-26 DIAGNOSIS — R2681 Unsteadiness on feet: Secondary | ICD-10-CM | POA: Diagnosis not present

## 2019-04-26 DIAGNOSIS — G309 Alzheimer's disease, unspecified: Secondary | ICD-10-CM | POA: Diagnosis not present

## 2019-04-26 DIAGNOSIS — R41841 Cognitive communication deficit: Secondary | ICD-10-CM | POA: Diagnosis not present

## 2019-04-26 DIAGNOSIS — R1311 Dysphagia, oral phase: Secondary | ICD-10-CM | POA: Diagnosis not present

## 2019-04-26 DIAGNOSIS — M6281 Muscle weakness (generalized): Secondary | ICD-10-CM | POA: Diagnosis not present

## 2019-04-30 DIAGNOSIS — G309 Alzheimer's disease, unspecified: Secondary | ICD-10-CM | POA: Diagnosis not present

## 2019-04-30 DIAGNOSIS — M6281 Muscle weakness (generalized): Secondary | ICD-10-CM | POA: Diagnosis not present

## 2019-04-30 DIAGNOSIS — R2681 Unsteadiness on feet: Secondary | ICD-10-CM | POA: Diagnosis not present

## 2019-04-30 DIAGNOSIS — R41841 Cognitive communication deficit: Secondary | ICD-10-CM | POA: Diagnosis not present

## 2019-04-30 DIAGNOSIS — R1311 Dysphagia, oral phase: Secondary | ICD-10-CM | POA: Diagnosis not present

## 2019-05-02 DIAGNOSIS — R69 Illness, unspecified: Secondary | ICD-10-CM | POA: Diagnosis not present

## 2019-05-02 DIAGNOSIS — G309 Alzheimer's disease, unspecified: Secondary | ICD-10-CM | POA: Diagnosis not present

## 2019-05-02 DIAGNOSIS — R2681 Unsteadiness on feet: Secondary | ICD-10-CM | POA: Diagnosis not present

## 2019-05-02 DIAGNOSIS — R41841 Cognitive communication deficit: Secondary | ICD-10-CM | POA: Diagnosis not present

## 2019-05-02 DIAGNOSIS — F200 Paranoid schizophrenia: Secondary | ICD-10-CM | POA: Diagnosis not present

## 2019-05-02 DIAGNOSIS — R1311 Dysphagia, oral phase: Secondary | ICD-10-CM | POA: Diagnosis not present

## 2019-05-02 DIAGNOSIS — M6281 Muscle weakness (generalized): Secondary | ICD-10-CM | POA: Diagnosis not present

## 2019-05-02 DIAGNOSIS — F062 Psychotic disorder with delusions due to known physiological condition: Secondary | ICD-10-CM | POA: Diagnosis not present

## 2019-05-03 DIAGNOSIS — G309 Alzheimer's disease, unspecified: Secondary | ICD-10-CM | POA: Diagnosis not present

## 2019-05-03 DIAGNOSIS — R41841 Cognitive communication deficit: Secondary | ICD-10-CM | POA: Diagnosis not present

## 2019-05-03 DIAGNOSIS — M6281 Muscle weakness (generalized): Secondary | ICD-10-CM | POA: Diagnosis not present

## 2019-05-03 DIAGNOSIS — R1311 Dysphagia, oral phase: Secondary | ICD-10-CM | POA: Diagnosis not present

## 2019-05-03 DIAGNOSIS — R2681 Unsteadiness on feet: Secondary | ICD-10-CM | POA: Diagnosis not present

## 2019-05-04 DIAGNOSIS — N186 End stage renal disease: Secondary | ICD-10-CM | POA: Diagnosis not present

## 2019-05-04 DIAGNOSIS — I129 Hypertensive chronic kidney disease with stage 1 through stage 4 chronic kidney disease, or unspecified chronic kidney disease: Secondary | ICD-10-CM | POA: Diagnosis not present

## 2019-05-04 DIAGNOSIS — Z992 Dependence on renal dialysis: Secondary | ICD-10-CM | POA: Diagnosis not present

## 2019-05-05 DIAGNOSIS — R2681 Unsteadiness on feet: Secondary | ICD-10-CM | POA: Diagnosis not present

## 2019-05-05 DIAGNOSIS — G309 Alzheimer's disease, unspecified: Secondary | ICD-10-CM | POA: Diagnosis not present

## 2019-05-05 DIAGNOSIS — R41841 Cognitive communication deficit: Secondary | ICD-10-CM | POA: Diagnosis not present

## 2019-05-05 DIAGNOSIS — R1311 Dysphagia, oral phase: Secondary | ICD-10-CM | POA: Diagnosis not present

## 2019-05-05 DIAGNOSIS — M6281 Muscle weakness (generalized): Secondary | ICD-10-CM | POA: Diagnosis not present

## 2019-05-07 DIAGNOSIS — G309 Alzheimer's disease, unspecified: Secondary | ICD-10-CM | POA: Diagnosis not present

## 2019-05-07 DIAGNOSIS — R1311 Dysphagia, oral phase: Secondary | ICD-10-CM | POA: Diagnosis not present

## 2019-05-07 DIAGNOSIS — R2681 Unsteadiness on feet: Secondary | ICD-10-CM | POA: Diagnosis not present

## 2019-05-07 DIAGNOSIS — E1129 Type 2 diabetes mellitus with other diabetic kidney complication: Secondary | ICD-10-CM | POA: Diagnosis not present

## 2019-05-07 DIAGNOSIS — D509 Iron deficiency anemia, unspecified: Secondary | ICD-10-CM | POA: Diagnosis not present

## 2019-05-07 DIAGNOSIS — R41841 Cognitive communication deficit: Secondary | ICD-10-CM | POA: Diagnosis not present

## 2019-05-07 DIAGNOSIS — D689 Coagulation defect, unspecified: Secondary | ICD-10-CM | POA: Diagnosis not present

## 2019-05-07 DIAGNOSIS — N186 End stage renal disease: Secondary | ICD-10-CM | POA: Diagnosis not present

## 2019-05-07 DIAGNOSIS — D631 Anemia in chronic kidney disease: Secondary | ICD-10-CM | POA: Diagnosis not present

## 2019-05-07 DIAGNOSIS — M6281 Muscle weakness (generalized): Secondary | ICD-10-CM | POA: Diagnosis not present

## 2019-05-07 DIAGNOSIS — N2581 Secondary hyperparathyroidism of renal origin: Secondary | ICD-10-CM | POA: Diagnosis not present

## 2019-05-07 DIAGNOSIS — Z992 Dependence on renal dialysis: Secondary | ICD-10-CM | POA: Diagnosis not present

## 2019-05-08 DIAGNOSIS — R2681 Unsteadiness on feet: Secondary | ICD-10-CM | POA: Diagnosis not present

## 2019-05-08 DIAGNOSIS — G309 Alzheimer's disease, unspecified: Secondary | ICD-10-CM | POA: Diagnosis not present

## 2019-05-08 DIAGNOSIS — M6281 Muscle weakness (generalized): Secondary | ICD-10-CM | POA: Diagnosis not present

## 2019-05-08 DIAGNOSIS — R41841 Cognitive communication deficit: Secondary | ICD-10-CM | POA: Diagnosis not present

## 2019-05-08 DIAGNOSIS — R1311 Dysphagia, oral phase: Secondary | ICD-10-CM | POA: Diagnosis not present

## 2019-05-09 DIAGNOSIS — R1311 Dysphagia, oral phase: Secondary | ICD-10-CM | POA: Diagnosis not present

## 2019-05-09 DIAGNOSIS — G309 Alzheimer's disease, unspecified: Secondary | ICD-10-CM | POA: Diagnosis not present

## 2019-05-09 DIAGNOSIS — R2681 Unsteadiness on feet: Secondary | ICD-10-CM | POA: Diagnosis not present

## 2019-05-09 DIAGNOSIS — R41841 Cognitive communication deficit: Secondary | ICD-10-CM | POA: Diagnosis not present

## 2019-05-09 DIAGNOSIS — M6281 Muscle weakness (generalized): Secondary | ICD-10-CM | POA: Diagnosis not present

## 2019-05-10 DIAGNOSIS — R41841 Cognitive communication deficit: Secondary | ICD-10-CM | POA: Diagnosis not present

## 2019-05-10 DIAGNOSIS — G309 Alzheimer's disease, unspecified: Secondary | ICD-10-CM | POA: Diagnosis not present

## 2019-05-10 DIAGNOSIS — R2681 Unsteadiness on feet: Secondary | ICD-10-CM | POA: Diagnosis not present

## 2019-05-10 DIAGNOSIS — M6281 Muscle weakness (generalized): Secondary | ICD-10-CM | POA: Diagnosis not present

## 2019-05-10 DIAGNOSIS — R1311 Dysphagia, oral phase: Secondary | ICD-10-CM | POA: Diagnosis not present

## 2019-05-11 DIAGNOSIS — M6281 Muscle weakness (generalized): Secondary | ICD-10-CM | POA: Diagnosis not present

## 2019-05-11 DIAGNOSIS — R2681 Unsteadiness on feet: Secondary | ICD-10-CM | POA: Diagnosis not present

## 2019-05-11 DIAGNOSIS — R1311 Dysphagia, oral phase: Secondary | ICD-10-CM | POA: Diagnosis not present

## 2019-05-11 DIAGNOSIS — R41841 Cognitive communication deficit: Secondary | ICD-10-CM | POA: Diagnosis not present

## 2019-05-11 DIAGNOSIS — G309 Alzheimer's disease, unspecified: Secondary | ICD-10-CM | POA: Diagnosis not present

## 2019-05-12 DIAGNOSIS — R2681 Unsteadiness on feet: Secondary | ICD-10-CM | POA: Diagnosis not present

## 2019-05-12 DIAGNOSIS — G309 Alzheimer's disease, unspecified: Secondary | ICD-10-CM | POA: Diagnosis not present

## 2019-05-12 DIAGNOSIS — R41841 Cognitive communication deficit: Secondary | ICD-10-CM | POA: Diagnosis not present

## 2019-05-12 DIAGNOSIS — R1311 Dysphagia, oral phase: Secondary | ICD-10-CM | POA: Diagnosis not present

## 2019-05-12 DIAGNOSIS — M6281 Muscle weakness (generalized): Secondary | ICD-10-CM | POA: Diagnosis not present

## 2019-05-14 DIAGNOSIS — M6281 Muscle weakness (generalized): Secondary | ICD-10-CM | POA: Diagnosis not present

## 2019-05-14 DIAGNOSIS — G309 Alzheimer's disease, unspecified: Secondary | ICD-10-CM | POA: Diagnosis not present

## 2019-05-14 DIAGNOSIS — R41841 Cognitive communication deficit: Secondary | ICD-10-CM | POA: Diagnosis not present

## 2019-05-14 DIAGNOSIS — R2681 Unsteadiness on feet: Secondary | ICD-10-CM | POA: Diagnosis not present

## 2019-05-14 DIAGNOSIS — R1311 Dysphagia, oral phase: Secondary | ICD-10-CM | POA: Diagnosis not present

## 2019-05-15 DIAGNOSIS — N186 End stage renal disease: Secondary | ICD-10-CM | POA: Diagnosis not present

## 2019-05-15 DIAGNOSIS — R2681 Unsteadiness on feet: Secondary | ICD-10-CM | POA: Diagnosis not present

## 2019-05-15 DIAGNOSIS — R41841 Cognitive communication deficit: Secondary | ICD-10-CM | POA: Diagnosis not present

## 2019-05-15 DIAGNOSIS — G309 Alzheimer's disease, unspecified: Secondary | ICD-10-CM | POA: Diagnosis not present

## 2019-05-15 DIAGNOSIS — Z992 Dependence on renal dialysis: Secondary | ICD-10-CM | POA: Diagnosis not present

## 2019-05-15 DIAGNOSIS — M6281 Muscle weakness (generalized): Secondary | ICD-10-CM | POA: Diagnosis not present

## 2019-05-15 DIAGNOSIS — R1311 Dysphagia, oral phase: Secondary | ICD-10-CM | POA: Diagnosis not present

## 2019-05-15 DIAGNOSIS — R69 Illness, unspecified: Secondary | ICD-10-CM | POA: Diagnosis not present

## 2019-05-15 DIAGNOSIS — I10 Essential (primary) hypertension: Secondary | ICD-10-CM | POA: Diagnosis not present

## 2019-05-16 DIAGNOSIS — M6281 Muscle weakness (generalized): Secondary | ICD-10-CM | POA: Diagnosis not present

## 2019-05-16 DIAGNOSIS — R1311 Dysphagia, oral phase: Secondary | ICD-10-CM | POA: Diagnosis not present

## 2019-05-16 DIAGNOSIS — R41841 Cognitive communication deficit: Secondary | ICD-10-CM | POA: Diagnosis not present

## 2019-05-16 DIAGNOSIS — G309 Alzheimer's disease, unspecified: Secondary | ICD-10-CM | POA: Diagnosis not present

## 2019-05-16 DIAGNOSIS — R2681 Unsteadiness on feet: Secondary | ICD-10-CM | POA: Diagnosis not present

## 2019-05-17 DIAGNOSIS — G309 Alzheimer's disease, unspecified: Secondary | ICD-10-CM | POA: Diagnosis not present

## 2019-05-17 DIAGNOSIS — M6281 Muscle weakness (generalized): Secondary | ICD-10-CM | POA: Diagnosis not present

## 2019-05-17 DIAGNOSIS — R1311 Dysphagia, oral phase: Secondary | ICD-10-CM | POA: Diagnosis not present

## 2019-05-17 DIAGNOSIS — R41841 Cognitive communication deficit: Secondary | ICD-10-CM | POA: Diagnosis not present

## 2019-05-17 DIAGNOSIS — R2681 Unsteadiness on feet: Secondary | ICD-10-CM | POA: Diagnosis not present

## 2019-05-18 DIAGNOSIS — G309 Alzheimer's disease, unspecified: Secondary | ICD-10-CM | POA: Diagnosis not present

## 2019-05-18 DIAGNOSIS — R41841 Cognitive communication deficit: Secondary | ICD-10-CM | POA: Diagnosis not present

## 2019-05-18 DIAGNOSIS — M6281 Muscle weakness (generalized): Secondary | ICD-10-CM | POA: Diagnosis not present

## 2019-05-18 DIAGNOSIS — R2681 Unsteadiness on feet: Secondary | ICD-10-CM | POA: Diagnosis not present

## 2019-05-18 DIAGNOSIS — R1311 Dysphagia, oral phase: Secondary | ICD-10-CM | POA: Diagnosis not present

## 2019-05-21 DIAGNOSIS — R2681 Unsteadiness on feet: Secondary | ICD-10-CM | POA: Diagnosis not present

## 2019-05-21 DIAGNOSIS — G309 Alzheimer's disease, unspecified: Secondary | ICD-10-CM | POA: Diagnosis not present

## 2019-05-21 DIAGNOSIS — R41841 Cognitive communication deficit: Secondary | ICD-10-CM | POA: Diagnosis not present

## 2019-05-21 DIAGNOSIS — R1311 Dysphagia, oral phase: Secondary | ICD-10-CM | POA: Diagnosis not present

## 2019-05-21 DIAGNOSIS — M6281 Muscle weakness (generalized): Secondary | ICD-10-CM | POA: Diagnosis not present

## 2019-05-22 DIAGNOSIS — R41841 Cognitive communication deficit: Secondary | ICD-10-CM | POA: Diagnosis not present

## 2019-05-22 DIAGNOSIS — G309 Alzheimer's disease, unspecified: Secondary | ICD-10-CM | POA: Diagnosis not present

## 2019-05-22 DIAGNOSIS — R2681 Unsteadiness on feet: Secondary | ICD-10-CM | POA: Diagnosis not present

## 2019-05-22 DIAGNOSIS — M6281 Muscle weakness (generalized): Secondary | ICD-10-CM | POA: Diagnosis not present

## 2019-05-22 DIAGNOSIS — R1311 Dysphagia, oral phase: Secondary | ICD-10-CM | POA: Diagnosis not present

## 2019-05-23 DIAGNOSIS — G309 Alzheimer's disease, unspecified: Secondary | ICD-10-CM | POA: Diagnosis not present

## 2019-05-23 DIAGNOSIS — R2681 Unsteadiness on feet: Secondary | ICD-10-CM | POA: Diagnosis not present

## 2019-05-23 DIAGNOSIS — R1311 Dysphagia, oral phase: Secondary | ICD-10-CM | POA: Diagnosis not present

## 2019-05-23 DIAGNOSIS — M6281 Muscle weakness (generalized): Secondary | ICD-10-CM | POA: Diagnosis not present

## 2019-05-23 DIAGNOSIS — R41841 Cognitive communication deficit: Secondary | ICD-10-CM | POA: Diagnosis not present

## 2019-05-24 DIAGNOSIS — R1311 Dysphagia, oral phase: Secondary | ICD-10-CM | POA: Diagnosis not present

## 2019-05-24 DIAGNOSIS — R41841 Cognitive communication deficit: Secondary | ICD-10-CM | POA: Diagnosis not present

## 2019-05-24 DIAGNOSIS — R2681 Unsteadiness on feet: Secondary | ICD-10-CM | POA: Diagnosis not present

## 2019-05-24 DIAGNOSIS — G309 Alzheimer's disease, unspecified: Secondary | ICD-10-CM | POA: Diagnosis not present

## 2019-05-24 DIAGNOSIS — M6281 Muscle weakness (generalized): Secondary | ICD-10-CM | POA: Diagnosis not present

## 2019-05-25 DIAGNOSIS — G309 Alzheimer's disease, unspecified: Secondary | ICD-10-CM | POA: Diagnosis not present

## 2019-05-25 DIAGNOSIS — R41841 Cognitive communication deficit: Secondary | ICD-10-CM | POA: Diagnosis not present

## 2019-05-25 DIAGNOSIS — R1311 Dysphagia, oral phase: Secondary | ICD-10-CM | POA: Diagnosis not present

## 2019-05-25 DIAGNOSIS — R2681 Unsteadiness on feet: Secondary | ICD-10-CM | POA: Diagnosis not present

## 2019-05-25 DIAGNOSIS — M6281 Muscle weakness (generalized): Secondary | ICD-10-CM | POA: Diagnosis not present

## 2019-05-27 DIAGNOSIS — R2681 Unsteadiness on feet: Secondary | ICD-10-CM | POA: Diagnosis not present

## 2019-05-27 DIAGNOSIS — G309 Alzheimer's disease, unspecified: Secondary | ICD-10-CM | POA: Diagnosis not present

## 2019-05-27 DIAGNOSIS — M6281 Muscle weakness (generalized): Secondary | ICD-10-CM | POA: Diagnosis not present

## 2019-05-27 DIAGNOSIS — R1311 Dysphagia, oral phase: Secondary | ICD-10-CM | POA: Diagnosis not present

## 2019-05-27 DIAGNOSIS — R41841 Cognitive communication deficit: Secondary | ICD-10-CM | POA: Diagnosis not present

## 2019-05-28 DIAGNOSIS — R2681 Unsteadiness on feet: Secondary | ICD-10-CM | POA: Diagnosis not present

## 2019-05-28 DIAGNOSIS — R1311 Dysphagia, oral phase: Secondary | ICD-10-CM | POA: Diagnosis not present

## 2019-05-28 DIAGNOSIS — M6281 Muscle weakness (generalized): Secondary | ICD-10-CM | POA: Diagnosis not present

## 2019-05-28 DIAGNOSIS — G309 Alzheimer's disease, unspecified: Secondary | ICD-10-CM | POA: Diagnosis not present

## 2019-05-28 DIAGNOSIS — R41841 Cognitive communication deficit: Secondary | ICD-10-CM | POA: Diagnosis not present

## 2019-05-29 DIAGNOSIS — G309 Alzheimer's disease, unspecified: Secondary | ICD-10-CM | POA: Diagnosis not present

## 2019-05-29 DIAGNOSIS — M6281 Muscle weakness (generalized): Secondary | ICD-10-CM | POA: Diagnosis not present

## 2019-05-29 DIAGNOSIS — R69 Illness, unspecified: Secondary | ICD-10-CM | POA: Diagnosis not present

## 2019-05-29 DIAGNOSIS — R269 Unspecified abnormalities of gait and mobility: Secondary | ICD-10-CM | POA: Diagnosis not present

## 2019-05-29 DIAGNOSIS — I120 Hypertensive chronic kidney disease with stage 5 chronic kidney disease or end stage renal disease: Secondary | ICD-10-CM | POA: Diagnosis not present

## 2019-05-29 DIAGNOSIS — R2681 Unsteadiness on feet: Secondary | ICD-10-CM | POA: Diagnosis not present

## 2019-05-29 DIAGNOSIS — Z9181 History of falling: Secondary | ICD-10-CM | POA: Diagnosis not present

## 2019-05-29 DIAGNOSIS — R41841 Cognitive communication deficit: Secondary | ICD-10-CM | POA: Diagnosis not present

## 2019-05-29 DIAGNOSIS — R1311 Dysphagia, oral phase: Secondary | ICD-10-CM | POA: Diagnosis not present

## 2019-05-29 DIAGNOSIS — Z992 Dependence on renal dialysis: Secondary | ICD-10-CM | POA: Diagnosis not present

## 2019-05-30 ENCOUNTER — Emergency Department (HOSPITAL_COMMUNITY)
Admission: EM | Admit: 2019-05-30 | Discharge: 2019-05-30 | Disposition: A | Discharge status: Home / Self Care | Payer: Medicare HMO | Attending: Emergency Medicine | Admitting: Emergency Medicine

## 2019-05-30 ENCOUNTER — Encounter (HOSPITAL_COMMUNITY): Payer: Self-pay

## 2019-05-30 ENCOUNTER — Other Ambulatory Visit: Payer: Self-pay

## 2019-05-30 DIAGNOSIS — Z79899 Other long term (current) drug therapy: Secondary | ICD-10-CM | POA: Diagnosis not present

## 2019-05-30 DIAGNOSIS — G309 Alzheimer's disease, unspecified: Secondary | ICD-10-CM | POA: Diagnosis not present

## 2019-05-30 DIAGNOSIS — E039 Hypothyroidism, unspecified: Secondary | ICD-10-CM | POA: Diagnosis not present

## 2019-05-30 DIAGNOSIS — D649 Anemia, unspecified: Secondary | ICD-10-CM | POA: Diagnosis not present

## 2019-05-30 DIAGNOSIS — Z87891 Personal history of nicotine dependence: Secondary | ICD-10-CM | POA: Insufficient documentation

## 2019-05-30 DIAGNOSIS — F0390 Unspecified dementia without behavioral disturbance: Secondary | ICD-10-CM | POA: Diagnosis not present

## 2019-05-30 DIAGNOSIS — Z7982 Long term (current) use of aspirin: Secondary | ICD-10-CM | POA: Insufficient documentation

## 2019-05-30 DIAGNOSIS — E7800 Pure hypercholesterolemia, unspecified: Secondary | ICD-10-CM | POA: Diagnosis not present

## 2019-05-30 DIAGNOSIS — R410 Disorientation, unspecified: Secondary | ICD-10-CM | POA: Diagnosis not present

## 2019-05-30 DIAGNOSIS — Z8673 Personal history of transient ischemic attack (TIA), and cerebral infarction without residual deficits: Secondary | ICD-10-CM | POA: Insufficient documentation

## 2019-05-30 DIAGNOSIS — M6281 Muscle weakness (generalized): Secondary | ICD-10-CM | POA: Diagnosis not present

## 2019-05-30 DIAGNOSIS — N186 End stage renal disease: Secondary | ICD-10-CM | POA: Diagnosis not present

## 2019-05-30 DIAGNOSIS — R69 Illness, unspecified: Secondary | ICD-10-CM | POA: Diagnosis not present

## 2019-05-30 DIAGNOSIS — R4182 Altered mental status, unspecified: Secondary | ICD-10-CM | POA: Diagnosis not present

## 2019-05-30 DIAGNOSIS — Z992 Dependence on renal dialysis: Secondary | ICD-10-CM | POA: Diagnosis not present

## 2019-05-30 DIAGNOSIS — I120 Hypertensive chronic kidney disease with stage 5 chronic kidney disease or end stage renal disease: Secondary | ICD-10-CM | POA: Insufficient documentation

## 2019-05-30 DIAGNOSIS — R2681 Unsteadiness on feet: Secondary | ICD-10-CM | POA: Diagnosis not present

## 2019-05-30 DIAGNOSIS — R41841 Cognitive communication deficit: Secondary | ICD-10-CM | POA: Diagnosis not present

## 2019-05-30 DIAGNOSIS — R451 Restlessness and agitation: Secondary | ICD-10-CM | POA: Diagnosis present

## 2019-05-30 DIAGNOSIS — F0150 Vascular dementia without behavioral disturbance: Secondary | ICD-10-CM

## 2019-05-30 DIAGNOSIS — N189 Chronic kidney disease, unspecified: Secondary | ICD-10-CM | POA: Diagnosis not present

## 2019-05-30 DIAGNOSIS — I10 Essential (primary) hypertension: Secondary | ICD-10-CM | POA: Diagnosis not present

## 2019-05-30 DIAGNOSIS — R404 Transient alteration of awareness: Secondary | ICD-10-CM | POA: Diagnosis not present

## 2019-05-30 DIAGNOSIS — Z7901 Long term (current) use of anticoagulants: Secondary | ICD-10-CM | POA: Diagnosis not present

## 2019-05-30 DIAGNOSIS — Z7401 Bed confinement status: Secondary | ICD-10-CM | POA: Diagnosis not present

## 2019-05-30 DIAGNOSIS — R1311 Dysphagia, oral phase: Secondary | ICD-10-CM | POA: Diagnosis not present

## 2019-05-30 DIAGNOSIS — E1122 Type 2 diabetes mellitus with diabetic chronic kidney disease: Secondary | ICD-10-CM | POA: Diagnosis not present

## 2019-05-30 LAB — I-STAT CHEM 8, ED
BUN: 113 mg/dL — ABNORMAL HIGH (ref 8–23)
Calcium, Ion: 1.21 mmol/L (ref 1.15–1.40)
Chloride: 112 mmol/L — ABNORMAL HIGH (ref 98–111)
Creatinine, Ser: 17.8 mg/dL — ABNORMAL HIGH (ref 0.61–1.24)
Glucose, Bld: 120 mg/dL — ABNORMAL HIGH (ref 70–99)
HCT: 29 % — ABNORMAL LOW (ref 39.0–52.0)
Hemoglobin: 9.9 g/dL — ABNORMAL LOW (ref 13.0–17.0)
Potassium: 5.2 mmol/L — ABNORMAL HIGH (ref 3.5–5.1)
Sodium: 140 mmol/L (ref 135–145)
TCO2: 20 mmol/L — ABNORMAL LOW (ref 22–32)

## 2019-05-30 NOTE — ED Triage Notes (Signed)
Pt to er room number 16 via ems, per ems pt is from [facility], states that the pt was scheduled for dialysis this am but he was too sleepy to have his dialysis run, states that when he got back to the facility he was agitated and they sent him to the er for agitation.  Pt in bed, pt arouses to verbal stim, pt nodding off during triage process.

## 2019-05-30 NOTE — ED Notes (Signed)
Miguel Tucker, Director for [employer] called asking if we could preform dialysis in the ER, asked MD, called Brandi back and relayed that pt didn't meet criteria for emergent dialysis and they should try for dialysis tomorrow and if needed they could always send pt back to the er.

## 2019-05-30 NOTE — ED Notes (Signed)
[place] and talked with RN Cornelia Copa, reviewed d/c instructions and need for dialysis tomorrow, rn states that she will talked with her director about dialysis for tomorrow.

## 2019-05-30 NOTE — ED Provider Notes (Signed)
[facility] EMERGENCY DEPARTMENT Provider Note   CSN: [id number] Arrival date & time: 05/30/19  1729     History Chief Complaint  Patient presents with  . Agitation    Miguel Tucker is a 86 y.o. male.  Patient was sent over from nursing home because of agitation today.  The history is provided by the nursing home and medical records. No language interpreter was used.  Altered Mental Status Presenting symptoms: behavior changes   Severity:  Mild Most recent episode:  Today Episode history:  Single Timing:  Rare Progression:  Resolved Chronicity:  Recurrent Context: dementia   Associated symptoms: no abdominal pain        Past Medical History:  Diagnosis Date  . Asthma   . Chronic kidney disease    on dialysis  . Chronic knee pain   . Dementia ([place])   . Dementia ([place])   . Diabetes mellitus    type 2  . ESRD (end stage renal disease) ([place])   . Frequent urination at night   . Gout   . Hard of hearing   . High cholesterol   . Hypertension   . Hypokalemia   . Radicular pain of left lower extremity     Patient Active Problem List   Diagnosis Date Noted  . Gait instability 01/12/2018  . Acute ischemic stroke ([place]) 12/13/2017  . Weakness of left upper extremity 12/12/2017  . Hypokalemia 12/12/2017  . ESRD (end stage renal disease) ([place]) 12/12/2017  . HLD (hyperlipidemia) 12/12/2017  . Gout 12/12/2017  . BPH (benign prostatic hyperplasia) 12/12/2017  . Asthma 12/12/2017  . GERD (gastroesophageal reflux disease) 12/12/2017  . Symptomatic anemia 11/25/2017  . Generalized weakness 09/06/2017  . HTN (hypertension) 09/06/2017  . Anemia, chronic disease 09/06/2017  . Dementia ([place]) 09/06/2017  . Onychomycosis 04/11/2017  . Hammer toe 04/11/2017  . Diabetes mellitus ([place]) 04/11/2017  . Osteoarthritis of knee 02/11/2017  . Difficulty in walking(719.7) 12/09/2011  . Radicular pain of left lower extremity 12/09/2011  . Wound healing, delayed 11/06/2010      Past Surgical History:  Procedure Laterality Date  . APPENDECTOMY    . AV FISTULA PLACEMENT Left 10/18/2017   Procedure: INSERTION OF ARTERIOVENOUS (AV) GORE-TEX GRAFT LEFT UPPER  ARM;  Surgeon: Waynetta Sandy, MD;  Location: [place];  Service: Vascular;  Laterality: Left;  . BASCILIC VEIN TRANSPOSITION Left 09/09/2015   Procedure: FIRST STAGE BRACHIAL VEIN TRANSPOSITION LEFT ARM;  Surgeon: Elam Dutch, MD;  Location: [place];  Service: Vascular;  Laterality: Left;  . BASCILIC VEIN TRANSPOSITION Left 02/20/2016   Procedure: LEFT ARM SECOND STAGE BASILIC VEIN TRANSPOSITION;  Surgeon: Rosetta Posner, MD;  Location: [place];  Service: Vascular;  Laterality: Left;  . ESOPHAGOGASTRODUODENOSCOPY N/A 11/27/2017   Procedure: ESOPHAGOGASTRODUODENOSCOPY (EGD);  Surgeon: Rogene Houston, MD;  [facility];  Service: Endoscopy;  Laterality: N/A;  . INSERTION OF DIALYSIS CATHETER Right 10/18/2017   Procedure: INSERTION OF TUNNELED  DIALYSIS CATHETER RIGHT INTERNAL JUGULAR;  Surgeon: Waynetta Sandy, MD;  Location: [place];  Service: Vascular;  Laterality: Right;  . OTHER SURGICAL HISTORY     testicular tumor- benign   . ROTATOR CUFF REPAIR Right   . WOUND DEBRIDEMENT  06/01/2011   Procedure: DEBRIDEMENT ABDOMINAL WOUND;  Surgeon: [name], MD;  [facility]: [facility];  Service: General;  Laterality: N/A;  Remove Sutures        History reviewed. No pertinent family history.  Social History  Tobacco Use  . Smoking status: Former Smoker    Quit date: 11/06/1975    Years since quitting: 43.5  . Smokeless tobacco: Never Used  Substance Use Topics  . Alcohol use: No  . Drug use: No    Home Medications Prior to Admission medications   Medication Sig Start Date End Date Taking? Authorizing Provider  allopurinol (ZYLOPRIM) 100 MG tablet Take 100 mg by mouth daily.  07/23/15   [provider]  amLODipine (NORVASC) 10 MG tablet Take 10 mg by mouth daily.     [provider]  amLODipine (NORVASC) 5 MG tablet Take 1 tablet (5 mg total) by mouth daily. Patient not taking: Reported on 08/19/2018 11/29/17   Murlean Iba, MD  apixaban (ELIQUIS) 2.5 MG TABS tablet Take 5 mg by mouth once. 08/19/18 08/19/18  [provider]  aspirin EC 81 MG tablet Take 1 tablet (81 mg total) by mouth every morning. 12/03/17   Johnson, Clanford L, MD  atorvastatin (LIPITOR) 40 MG tablet TAKE 1 TABLET BY MOUTH ONCE DAILY AT 6PM Patient taking differently: Take 40 mg by mouth at bedtime.  02/21/18   [name], MD  calcitRIOL (ROCALTROL) 0.25 MCG capsule Take 1 capsule (0.25 mcg total) by mouth every Monday, Wednesday, and Friday with hemodialysis. Patient not taking: Reported on 08/19/2018 12/16/17   Barton Dubois, MD  colchicine 0.6 MG tablet TAKE 1 TABLET BY MOUTH ONCE DAILY. Patient taking differently: Take 0.3 mg by mouth daily.  02/21/18   [name], MD  donepezil (ARICEPT) 10 MG tablet Take 10 mg by mouth at bedtime.    [provider]  donepezil (ARICEPT) 5 MG tablet Take 2 tablets (10 mg total) by mouth at bedtime. Patient not taking: Reported on 08/19/2018 12/14/17   Barton Dubois, MD  enoxaparin (LOVENOX) 30 MG/0.3ML injection Inject 30 mg into the skin See admin instructions. Inject 30 mg into the skin every morning at 9 AM to prevent blood clotting (for 21 days) 08/20/18 09/09/18  [provider]  ferrous sulfate 325 (65 FE) MG tablet Take 325 mg by mouth daily with breakfast.    [provider]  finasteride (PROSCAR) 5 MG tablet Take 5 mg by mouth daily.    [provider]  loratadine (CLARITIN) 10 MG tablet Take 10 mg by mouth daily.    [provider]  LORazepam (ATIVAN) 0.5 MG tablet Take 0.5 mg by mouth See admin instructions. Take 0.5 mg by mouth in the morning on Mon/Wed/Fri for anxiety- prior to dialysis    [provider]  montelukast (SINGULAIR) 10 MG tablet Take 10 mg  by mouth at bedtime.    [provider]  multivitamin (RENA-VIT) TABS tablet Take 1 tablet by mouth at bedtime.    [provider]  sevelamer (RENAGEL) 800 MG tablet Take 800 mg by mouth 3 (three) times daily.    [provider]    Allergies    Penicillins  Review of Systems   Review of Systems  Unable to perform ROS: Dementia  Gastrointestinal: Negative for abdominal pain.    Physical Exam Updated Vital Signs BP (!) 152/98 (BP Location: Right Arm)   Pulse 87   Temp 99 F (37.2 C) (Oral)   Resp 18   Ht 5\' 5"  (1.651 m)   Wt 72.6 kg   SpO2 97%   BMI 26.63 kg/m   Physical Exam Vitals and nursing note reviewed.  Constitutional:      Appearance:  He is well-developed.     Comments: Mildly lethargic  HENT:     Head: Normocephalic.     Nose: Nose normal.  Eyes:     General: No scleral icterus.    Conjunctiva/sclera: Conjunctivae normal.  Neck:     Thyroid: No thyromegaly.  Cardiovascular:     Rate and Rhythm: Normal rate and regular rhythm.     Heart sounds: No murmur. No friction rub. No gallop.   Pulmonary:     Breath sounds: No stridor. No wheezing or rales.  Chest:     Chest wall: No tenderness.  Abdominal:     General: There is no distension.     Tenderness: There is no abdominal tenderness. There is no rebound.  Musculoskeletal:        General: Normal range of motion.     Cervical back: Neck supple.     Comments: AV fistula left arm  Lymphadenopathy:     Cervical: No cervical adenopathy.  Skin:    Findings: No erythema or rash.  Neurological:     Motor: No abnormal muscle tone.     Coordination: Coordination normal.     Comments: Oriented to person only but this is normal     ED Results / Procedures / Treatments   Labs (all labs ordered are listed, but only abnormal results are displayed) Labs Reviewed  I-STAT CHEM 8, ED - Abnormal; Notable for the following components:      Result Value   Potassium 5.2 (*)    Chloride  112 (*)    BUN 113 (*)    Creatinine, Ser 17.80 (*)    Glucose, Bld 120 (*)    TCO2 20 (*)    Hemoglobin 9.9 (*)    HCT 29.0 (*)    All other components within normal limits    EKG EKG Interpretation  Date/Time:  Wednesday May 30 2019 17:42:23 EDT Ventricular Rate:  86 PR Interval:    QRS Duration: 93 QT Interval:  402 QTC Calculation: 481 R Axis:   -57 Text Interpretation: Sinus rhythm Atrial premature complex Left anterior fascicular block Left ventricular hypertrophy Anterior infarct, old Confirmed by [place] Ferguson [phone]) on 05/30/2019 8:00:59 PM   Radiology No results found.  Procedures Procedures (including critical care time)  Medications Ordered in ED Medications - No data to display  ED Course  I have reviewed the triage vital signs and the nursing notes.  Pertinent labs & imaging results that were available during my care of the patient were reviewed by me and considered in my medical decision making (see chart for details).    MDM Rules/Calculators/A&P                      Patient with minimally elevated potassium.  Patient showed no agitation in the emergency department.  Patient with dementia he will be sent back to his nursing home when he should get dialysis tomorrow       This patient presents to the ED for concern of agitation this involves an extensive number of treatment options, and is a complaint that carries with it a high risk of complications and morbidity.  The differential diagnosis includes dementia hypoxia   Lab Tests:   I Ordered, reviewed, and interpreted labs, which included chemistries and hemoglobin which showed potassium mildly elevated and anemia  Medicines ordered:     Imaging Studies ordered:  Additional history obtained:   Additional history obtained from old records and nursing  home  Previous records obtained and reviewed  Consultations Obtained:     Reevaluation:  After the interventions stated above,  I reevaluated the patient and found improved  Critical Interventions:  .   Final Clinical Impression(s) / ED Diagnoses Final diagnoses:  Vascular dementia without behavioral disturbance [facility])    Rx / DC Orders ED Discharge Orders    None       M[place] Ferguson, MD 05/30/19 2010

## 2019-05-30 NOTE — Discharge Instructions (Addendum)
Go to dialysis tomorrow

## 2019-05-30 NOTE — ED Notes (Signed)
Report given to EMS, pt unable to sign d/c instructions, read and reviewed d/c instructions with pt. Pt from dpt via ems.

## 2019-05-31 DIAGNOSIS — G309 Alzheimer's disease, unspecified: Secondary | ICD-10-CM | POA: Diagnosis not present

## 2019-05-31 DIAGNOSIS — R41841 Cognitive communication deficit: Secondary | ICD-10-CM | POA: Diagnosis not present

## 2019-05-31 DIAGNOSIS — M6281 Muscle weakness (generalized): Secondary | ICD-10-CM | POA: Diagnosis not present

## 2019-05-31 DIAGNOSIS — R2681 Unsteadiness on feet: Secondary | ICD-10-CM | POA: Diagnosis not present

## 2019-05-31 DIAGNOSIS — R1311 Dysphagia, oral phase: Secondary | ICD-10-CM | POA: Diagnosis not present

## 2019-06-01 ENCOUNTER — Inpatient Hospital Stay (HOSPITAL_COMMUNITY)
Admission: EM | Admit: 2019-06-01 | Discharge: 2019-06-05 | DRG: 082 | Disposition: A | Discharge status: Hospice | Payer: Medicare HMO | Source: Skilled Nursing Facility | Attending: Family Medicine | Admitting: Family Medicine

## 2019-06-01 ENCOUNTER — Encounter (HOSPITAL_COMMUNITY): Payer: Self-pay | Admitting: Emergency Medicine

## 2019-06-01 ENCOUNTER — Emergency Department (HOSPITAL_COMMUNITY): Payer: Medicare HMO

## 2019-06-01 ENCOUNTER — Other Ambulatory Visit: Payer: Self-pay

## 2019-06-01 DIAGNOSIS — Z992 Dependence on renal dialysis: Secondary | ICD-10-CM

## 2019-06-01 DIAGNOSIS — R471 Dysarthria and anarthria: Secondary | ICD-10-CM | POA: Diagnosis present

## 2019-06-01 DIAGNOSIS — N186 End stage renal disease: Secondary | ICD-10-CM | POA: Diagnosis present

## 2019-06-01 DIAGNOSIS — W19XXXA Unspecified fall, initial encounter: Secondary | ICD-10-CM | POA: Diagnosis not present

## 2019-06-01 DIAGNOSIS — K219 Gastro-esophageal reflux disease without esophagitis: Secondary | ICD-10-CM | POA: Diagnosis present

## 2019-06-01 DIAGNOSIS — Z20822 Contact with and (suspected) exposure to covid-19: Secondary | ICD-10-CM | POA: Diagnosis present

## 2019-06-01 DIAGNOSIS — H9190 Unspecified hearing loss, unspecified ear: Secondary | ICD-10-CM | POA: Diagnosis present

## 2019-06-01 DIAGNOSIS — R410 Disorientation, unspecified: Secondary | ICD-10-CM | POA: Diagnosis not present

## 2019-06-01 DIAGNOSIS — S065X9A Traumatic subdural hemorrhage with loss of consciousness of unspecified duration, initial encounter: Secondary | ICD-10-CM | POA: Diagnosis not present

## 2019-06-01 DIAGNOSIS — Z781 Physical restraint status: Secondary | ICD-10-CM

## 2019-06-01 DIAGNOSIS — G8929 Other chronic pain: Secondary | ICD-10-CM | POA: Diagnosis present

## 2019-06-01 DIAGNOSIS — I10 Essential (primary) hypertension: Secondary | ICD-10-CM | POA: Diagnosis not present

## 2019-06-01 DIAGNOSIS — Z880 Allergy status to penicillin: Secondary | ICD-10-CM

## 2019-06-01 DIAGNOSIS — E8889 Other specified metabolic disorders: Secondary | ICD-10-CM | POA: Diagnosis present

## 2019-06-01 DIAGNOSIS — G9349 Other encephalopathy: Secondary | ICD-10-CM | POA: Diagnosis present

## 2019-06-01 DIAGNOSIS — Z9115 Patient's noncompliance with renal dialysis: Secondary | ICD-10-CM

## 2019-06-01 DIAGNOSIS — S0011XA Contusion of right eyelid and periocular area, initial encounter: Secondary | ICD-10-CM | POA: Diagnosis present

## 2019-06-01 DIAGNOSIS — E785 Hyperlipidemia, unspecified: Secondary | ICD-10-CM | POA: Diagnosis present

## 2019-06-01 DIAGNOSIS — D631 Anemia in chronic kidney disease: Secondary | ICD-10-CM | POA: Diagnosis present

## 2019-06-01 DIAGNOSIS — R404 Transient alteration of awareness: Secondary | ICD-10-CM | POA: Diagnosis not present

## 2019-06-01 DIAGNOSIS — I959 Hypotension, unspecified: Secondary | ICD-10-CM | POA: Diagnosis present

## 2019-06-01 DIAGNOSIS — R451 Restlessness and agitation: Secondary | ICD-10-CM | POA: Diagnosis present

## 2019-06-01 DIAGNOSIS — R569 Unspecified convulsions: Secondary | ICD-10-CM | POA: Diagnosis not present

## 2019-06-01 DIAGNOSIS — I120 Hypertensive chronic kidney disease with stage 5 chronic kidney disease or end stage renal disease: Secondary | ICD-10-CM | POA: Diagnosis not present

## 2019-06-01 DIAGNOSIS — R69 Illness, unspecified: Secondary | ICD-10-CM | POA: Diagnosis not present

## 2019-06-01 DIAGNOSIS — E875 Hyperkalemia: Secondary | ICD-10-CM | POA: Diagnosis present

## 2019-06-01 DIAGNOSIS — F0390 Unspecified dementia without behavioral disturbance: Secondary | ICD-10-CM | POA: Diagnosis present

## 2019-06-01 DIAGNOSIS — R4182 Altered mental status, unspecified: Secondary | ICD-10-CM | POA: Diagnosis not present

## 2019-06-01 DIAGNOSIS — Z515 Encounter for palliative care: Secondary | ICD-10-CM | POA: Diagnosis not present

## 2019-06-01 DIAGNOSIS — M109 Gout, unspecified: Secondary | ICD-10-CM | POA: Diagnosis present

## 2019-06-01 DIAGNOSIS — I6200 Nontraumatic subdural hemorrhage, unspecified: Secondary | ICD-10-CM | POA: Diagnosis not present

## 2019-06-01 DIAGNOSIS — S065XAA Traumatic subdural hemorrhage with loss of consciousness status unknown, initial encounter: Secondary | ICD-10-CM

## 2019-06-01 DIAGNOSIS — Z79899 Other long term (current) drug therapy: Secondary | ICD-10-CM

## 2019-06-01 DIAGNOSIS — Z87891 Personal history of nicotine dependence: Secondary | ICD-10-CM

## 2019-06-01 DIAGNOSIS — Z66 Do not resuscitate: Secondary | ICD-10-CM | POA: Diagnosis not present

## 2019-06-01 DIAGNOSIS — Z7982 Long term (current) use of aspirin: Secondary | ICD-10-CM

## 2019-06-01 DIAGNOSIS — E1122 Type 2 diabetes mellitus with diabetic chronic kidney disease: Secondary | ICD-10-CM | POA: Diagnosis present

## 2019-06-01 DIAGNOSIS — Z8673 Personal history of transient ischemic attack (TIA), and cerebral infarction without residual deficits: Secondary | ICD-10-CM

## 2019-06-01 DIAGNOSIS — N400 Enlarged prostate without lower urinary tract symptoms: Secondary | ICD-10-CM | POA: Diagnosis present

## 2019-06-01 DIAGNOSIS — W1830XA Fall on same level, unspecified, initial encounter: Secondary | ICD-10-CM | POA: Diagnosis present

## 2019-06-01 LAB — CBC WITH DIFFERENTIAL/PLATELET
Abs Immature Granulocytes: 0.03 10*3/uL (ref 0.00–0.07)
Basophils Absolute: 0 10*3/uL (ref 0.0–0.1)
Basophils Relative: 0 %
Eosinophils Absolute: 0.3 10*3/uL (ref 0.0–0.5)
Eosinophils Relative: 4 %
HCT: 30.8 % — ABNORMAL LOW (ref 39.0–52.0)
Hemoglobin: 9.7 g/dL — ABNORMAL LOW (ref 13.0–17.0)
Immature Granulocytes: 0 %
Lymphocytes Relative: 17 %
Lymphs Abs: 1.4 10*3/uL (ref 0.7–4.0)
MCH: 31.4 pg (ref 26.0–34.0)
MCHC: 31.5 g/dL (ref 30.0–36.0)
MCV: 99.7 fL (ref 80.0–100.0)
Monocytes Absolute: 0.9 10*3/uL (ref 0.1–1.0)
Monocytes Relative: 11 %
Neutro Abs: 5.4 10*3/uL (ref 1.7–7.7)
Neutrophils Relative %: 68 %
Platelets: 186 10*3/uL (ref 150–400)
RBC: 3.09 MIL/uL — ABNORMAL LOW (ref 4.22–5.81)
RDW: 16.8 % — ABNORMAL HIGH (ref 11.5–15.5)
WBC: 8 10*3/uL (ref 4.0–10.5)
nRBC: 0 % (ref 0.0–0.2)

## 2019-06-01 LAB — COMPREHENSIVE METABOLIC PANEL
ALT: 37 U/L (ref 0–44)
AST: 27 U/L (ref 15–41)
Albumin: 3.4 g/dL — ABNORMAL LOW (ref 3.5–5.0)
Alkaline Phosphatase: 63 U/L (ref 38–126)
Anion gap: 15 (ref 5–15)
BUN: 136 mg/dL — ABNORMAL HIGH (ref 8–23)
CO2: 22 mmol/L (ref 22–32)
Calcium: 8.7 mg/dL — ABNORMAL LOW (ref 8.9–10.3)
Chloride: 107 mmol/L (ref 98–111)
Creatinine, Ser: 16.7 mg/dL — ABNORMAL HIGH (ref 0.61–1.24)
GFR calc Af Amer: 3 mL/min — ABNORMAL LOW (ref >60)
GFR calc non Af Amer: 2 mL/min — ABNORMAL LOW (ref >60)
Glucose, Bld: 102 mg/dL — ABNORMAL HIGH (ref 70–99)
Potassium: 5.2 mmol/L — ABNORMAL HIGH (ref 3.5–5.1)
Sodium: 144 mmol/L (ref 135–145)
Total Bilirubin: 0.5 mg/dL (ref 0.3–1.2)
Total Protein: 6.6 g/dL (ref 6.5–8.1)

## 2019-06-01 LAB — URINALYSIS, ROUTINE W REFLEX MICROSCOPIC
Bacteria, UA: NONE SEEN
Bilirubin Urine: NEGATIVE
Glucose, UA: 50 mg/dL — AB
Ketones, ur: NEGATIVE mg/dL
Leukocytes,Ua: NEGATIVE
Nitrite: NEGATIVE
Protein, ur: 100 mg/dL — AB
Specific Gravity, Urine: 1.011 (ref 1.005–1.030)
pH: 7 (ref 5.0–8.0)

## 2019-06-01 LAB — SARS CORONAVIRUS 2 BY RT PCR (HOSPITAL ORDER, PERFORMED IN ~~LOC~~ HOSPITAL LAB): SARS Coronavirus 2: NEGATIVE

## 2019-06-01 MED ORDER — LORAZEPAM 2 MG/ML IJ SOLN
0.5000 mg | Freq: Once | INTRAMUSCULAR | Status: AC
  Administered 2019-06-01: 0.5 mg via INTRAVENOUS
  Filled 2019-06-01: qty 1

## 2019-06-01 NOTE — ED Notes (Signed)
Pt increasingly confused. Pt trying to climb over bedrail and pulling on wires and lines. Sitter order placed and currently at bedside. [facility] SNF confirms Pt is always confused and this is baseline. Pt demonstrating increasing combativeness. SNF undated on Pt status that Pt will be admitted to hospital.

## 2019-06-01 NOTE — ED Provider Notes (Signed)
[facility] Provider Note   CSN: [id number] Arrival date & time: 06/01/19  1613     History Chief Complaint  Patient presents with  . Altered Mental Status    Miguel Tucker is a 86 y.o. male with a history as outlined below, significant for chronic kidney disease on dialysis who has missed his last 2 dialysis sessions Wednesday and today secondary to increased confusion and agitation, history of dementia, diabetes, hypertension presenting with worsening agitation.  He lives at a local nursing home and is generally a alert and oriented to person only at baseline, however has had increasing agitation and has not been cooperative for dialysis or other interventions.  He was seen here 2 days ago with similar complaints at which time his work-up including his potassium level was stable so he was sent back to the nursing facility.  At some point between then and now he fell as he presents with bruising at his right periorbital region which was not noted here 2 days ago.  HPI     Past Medical History:  Diagnosis Date  . Asthma   . Chronic kidney disease    on dialysis  . Chronic knee pain   . Dementia ([place])   . Dementia ([place])   . Diabetes mellitus    type 2  . ESRD (end stage renal disease) ([place])   . Frequent urination at night   . Gout   . Hard of hearing   . High cholesterol   . Hypertension   . Hypokalemia   . Radicular pain of left lower extremity     Patient Active Problem List   Diagnosis Date Noted  . Gait instability 01/12/2018  . Acute ischemic stroke ([place]) 12/13/2017  . Weakness of left upper extremity 12/12/2017  . Hypokalemia 12/12/2017  . ESRD (end stage renal disease) ([place]) 12/12/2017  . HLD (hyperlipidemia) 12/12/2017  . Gout 12/12/2017  . BPH (benign prostatic hyperplasia) 12/12/2017  . Asthma 12/12/2017  . GERD (gastroesophageal reflux disease) 12/12/2017  . Symptomatic anemia 11/25/2017  . Generalized weakness 09/06/2017  .  HTN (hypertension) 09/06/2017  . Anemia, chronic disease 09/06/2017  . Dementia ([place]) 09/06/2017  . Onychomycosis 04/11/2017  . Hammer toe 04/11/2017  . Diabetes mellitus ([place]) 04/11/2017  . Osteoarthritis of knee 02/11/2017  . Difficulty in walking(719.7) 12/09/2011  . Radicular pain of left lower extremity 12/09/2011  . Wound healing, delayed 11/06/2010    Past Surgical History:  Procedure Laterality Date  . APPENDECTOMY    . AV FISTULA PLACEMENT Left 10/18/2017   Procedure: INSERTION OF ARTERIOVENOUS (AV) GORE-TEX GRAFT LEFT UPPER  ARM;  Surgeon: Waynetta Sandy, MD;  Location: [place];  Service: Vascular;  Laterality: Left;  . BASCILIC VEIN TRANSPOSITION Left 09/09/2015   Procedure: FIRST STAGE BRACHIAL VEIN TRANSPOSITION LEFT ARM;  Surgeon: Elam Dutch, MD;  Location: [place];  Service: Vascular;  Laterality: Left;  . BASCILIC VEIN TRANSPOSITION Left 02/20/2016   Procedure: LEFT ARM SECOND STAGE BASILIC VEIN TRANSPOSITION;  Surgeon: Rosetta Posner, MD;  Location: [place];  Service: Vascular;  Laterality: Left;  . ESOPHAGOGASTRODUODENOSCOPY N/A 11/27/2017   Procedure: ESOPHAGOGASTRODUODENOSCOPY (EGD);  Surgeon: Rogene Houston, MD;  [facility];  Service: Endoscopy;  Laterality: N/A;  . INSERTION OF DIALYSIS CATHETER Right 10/18/2017   Procedure: INSERTION OF TUNNELED  DIALYSIS CATHETER RIGHT INTERNAL JUGULAR;  Surgeon: Waynetta Sandy, MD;  Location: [place];  Service: Vascular;  Laterality: Right;  . OTHER  SURGICAL HISTORY     testicular tumor- benign   . ROTATOR CUFF REPAIR Right   . WOUND DEBRIDEMENT  06/01/2011   Procedure: DEBRIDEMENT ABDOMINAL WOUND;  Surgeon: [name], MD;  [facility];  Service: General;  Laterality: N/A;  Remove Sutures        No family history on file.  Social History   Tobacco Use  . Smoking status: Former Smoker    Quit date: 11/06/1975    Years since quitting: 43.6  . Smokeless tobacco: Never Used   Substance Use Topics  . Alcohol use: No  . Drug use: No    Home Medications Prior to Admission medications   Medication Sig Start Date End Date Taking? Authorizing Provider  acetaminophen (TYLENOL) 500 MG tablet Take 500 mg by mouth in the morning, at noon, and at bedtime.   Yes [provider]  albuterol (VENTOLIN HFA) 108 (90 Base) MCG/ACT inhaler Inhale 1-2 puffs into the lungs every 6 (six) hours as needed for wheezing or shortness of breath.   Yes [provider]  allopurinol (ZYLOPRIM) 100 MG tablet Take 100 mg by mouth every Monday, Wednesday, and Friday.  07/23/15  Yes [provider]  Amino Acids-Protein Hydrolys (PRO-STAT PO) Take 30 mLs by mouth in the morning and at bedtime.   Yes [provider]  aspirin EC 81 MG tablet Take 1 tablet (81 mg total) by mouth every morning. 12/03/17  Yes Johnson, Clanford L, MD  atorvastatin (LIPITOR) 40 MG tablet TAKE 1 TABLET BY MOUTH ONCE DAILY AT 6PM Patient taking differently: Take 40 mg by mouth at bedtime.  02/21/18  Yes [place], [place] Nunnery, MD  b complex-vitamin c-folic acid (NEPHRO-VITE) 0.8 MG TABS tablet Take 1 tablet by mouth at bedtime.   Yes [provider]  cloNIDine (CATAPRES) 0.1 MG tablet Take 0.1 mg by mouth every 8 (eight) hours as needed (for systolic >300 or diastolic > 90).    Yes [provider]  colchicine 0.6 MG tablet TAKE 1 TABLET BY MOUTH ONCE DAILY. Patient taking differently: Take 0.3 mg by mouth daily.  02/21/18  Yes [place], [place] Nunnery, MD  finasteride (PROSCAR) 5 MG tablet Take 5 mg by mouth daily.   Yes [provider]  loratadine (CLARITIN) 10 MG tablet Take 10 mg by mouth daily.   Yes [provider]  LORazepam (ATIVAN) 0.5 MG tablet Take 0.5 mg by mouth every Monday, Wednesday, and Friday with hemodialysis. Take 0.5 mg by mouth in the morning on Mon/Wed/Fri for anxiety- prior to dialysis    Yes [provider]  LORazepam (ATIVAN) 1 MG  tablet Take 1 mg by mouth once.   Yes [provider]  OLANZapine (ZYPREXA) 5 MG tablet Take 5 mg by mouth in the morning.   Yes [provider]  sertraline (ZOLOFT) 25 MG tablet Take 25 mg by mouth at bedtime.   Yes [provider]  sevelamer (RENAGEL) 800 MG tablet Take 800 mg by mouth 3 (three) times daily. *May give one tablet as needed with any snack   Yes [provider]  NONFORMULARY OR COMPOUNDED ITEM Apply 1 application topically every Monday, Wednesday, and Friday. Applied to arms and back for anxiety 0.29ml=1mg     [provider]    Allergies    Penicillins  Review of Systems   Review of Systems  Unable to perform ROS: Mental status change  Psychiatric/Behavioral: Positive for agitation.    Physical Exam Updated Vital Signs  BP (!) 163/71 (BP Location: Right Arm)   Pulse 87   Temp 97.8 F (36.6 C) (Oral)   Resp 16   Ht 5\' 5"  (1.651 m)   Wt 72 kg   SpO2 100%   BMI 26.41 kg/m   Physical Exam Vitals and nursing note reviewed.  Constitutional:      Appearance: He is well-developed.  HENT:     Head: Normocephalic. Contusion present. No Battle's sign.     Jaw: No tenderness or swelling.     Comments: Right periorbital contusion. Eyes:     Conjunctiva/sclera: Conjunctivae normal.  Cardiovascular:     Rate and Rhythm: Normal rate and regular rhythm.     Heart sounds: Normal heart sounds.  Pulmonary:     Effort: Pulmonary effort is normal.     Breath sounds: Normal breath sounds. No wheezing.  Abdominal:     General: Bowel sounds are normal.     Palpations: Abdomen is soft.     Tenderness: There is no abdominal tenderness.  Musculoskeletal:        General: Normal range of motion.     Cervical back: Normal range of motion.  Skin:    General: Skin is warm and dry.  Neurological:     Mental Status: He is alert. He is disoriented and confused.     Motor: Motor function is intact.     Comments: Moving all 4 extremities  without deficit.  No facial droop.  Complete neuro testing difficult as patient does not follow instructions.     ED Results / Procedures / Treatments   Labs (all labs ordered are listed, but only abnormal results are displayed) Labs Reviewed  CBC WITH DIFFERENTIAL/PLATELET - Abnormal; Notable for the following components:      Result Value   RBC 3.09 (*)    Hemoglobin 9.7 (*)    HCT 30.8 (*)    RDW 16.8 (*)    All other components within normal limits  COMPREHENSIVE METABOLIC PANEL - Abnormal; Notable for the following components:   Potassium 5.2 (*)    Glucose, Bld 102 (*)    BUN 136 (*)    Creatinine, Ser 16.70 (*)    Calcium 8.7 (*)    Albumin 3.4 (*)    GFR calc non Af Amer 2 (*)    GFR calc Af Amer 3 (*)    All other components within normal limits  URINALYSIS, ROUTINE W REFLEX MICROSCOPIC - Abnormal; Notable for the following components:   Color, Urine STRAW (*)    Glucose, UA 50 (*)    Hgb urine dipstick LARGE (*)    Protein, ur 100 (*)    All other components within normal limits  SARS CORONAVIRUS 2 BY RT PCR (HOSPITAL ORDER, [place] [facility])  URINE CULTURE    EKG None  Radiology CT Head Wo Contrast  Result Date: 06/01/2019 CLINICAL DATA:  Altered mental status. Head trauma, minor (Age >= 65y) EXAM: CT HEAD WITHOUT CONTRAST TECHNIQUE: Contiguous axial images were obtained from the base of the skull through the vertex without intravenous contrast. COMPARISON:  Head CT 04/24/2019, brain MRI 12/13/2017 FINDINGS: Brain: Acute left frontal subdural hematoma measures up to 6 mm in thickness. Possible tiny acute right frontal subdural hematoma, series 2, image 23. No significant mass effect. There is no midline shift. Advanced atrophy and prominence of the extra-axial CSF spaces, stable from prior exam. No subarachnoid or intraparenchymal blood. No hydrocephalus. Stable degree of generalized atrophy.  Basilar cisterns are patent. Vascular:  Atherosclerosis of skullbase vasculature without hyperdense vessel or abnormal calcification. Skull: No fracture or focal lesion. Sinuses/Orbits: Trace mucosal thickening in left maxillary sinus. Paranasal sinuses otherwise clear. The mastoid air cells are clear. Other: None. IMPRESSION: 1. Acute small left frontal subdural hematoma measures up to 6 mm in thickness. No significant mass effect. No midline shift. 2. Possible tiny acute right frontal subdural hematoma. 3. Advanced atrophy and prominence of the extra-axial CSF spaces, stable from prior exam. These results were called by telephone at the time of interpretation on 06/01/2019 at 8:16 pm to Dr Tomi Bamberger , who verbally acknowledged these results. Electronically Signed   By: Keith Rake [name]   On: 06/01/2019 20:16    Procedures Procedures (including critical care time)  Medications Ordered in ED Medications  LORazepam (ATIVAN) injection 0.5 mg (0.5 mg Intravenous Given 06/01/19 1946)  LORazepam (ATIVAN) injection 0.5 mg (0.5 mg Intravenous Given 06/01/19 2146)    ED Course  I have reviewed the triage vital signs and the nursing notes.  Pertinent labs & imaging results that were available during my care of the patient were reviewed by me and considered in my medical decision making (see chart for details).    MDM Rules/Calculators/A&P                     Patient with increased confusion and agitation, dementia at baseline.  Labs and imaging reviewed.  He does have a small left frontal subdural hematoma.  This was discussed with Dr. Cato Mulligan of neurosurgery who reviewed the imaging and felt patient can be admitted at [facility] with plans for repeat imaging in the morning given the small size of the subdural.  Given there is no midline shift, doubts that this is the source of his agitation.  Patient's medication list initially included Eliquis, it was unclear the reason for this medication.  Call to the nursing home confirmed that he is no  longer on this medication.  His labs were reviewed, he does have increased hemoglobin and WBCs in his urine, urine culture was ordered.  He is nitrite negative with no bacteria.  He is anemic at 9.7, this appears stable, and probably from chronic kidney disease.  His potassium is also stable at 5.2, unchanged from his visit here 2 days ago.  Patient was discussed with Dr. Darrick Meigs who accepts patient for admission.  Final Clinical Impression(s) / ED Diagnoses Final diagnoses:  Subdural hematoma (H[facility])  ESRD (end stage renal disease) ([place])  Hyperkalemia    Rx / DC Orders ED Discharge Orders    None       Landis Martins 06/02/19 0016    Dorie Rank, MD 06/02/19 [phone]

## 2019-06-01 NOTE — ED Triage Notes (Signed)
Pt from [place] w/altered mental status. Dialysis pt mon-wed-fri. Pt had dialysis 1 hour Monday and has not been since.  Hx of dementia. cbg 115.

## 2019-06-01 NOTE — ED Notes (Signed)
Pt transferred to CT Scan

## 2019-06-01 NOTE — Progress Notes (Signed)
  NEUROSURGERY PROGRESS NOTE   Received call from PA Idol, EDP [facility], regarding patient 86 year old with history dementia, ESRD on dialysis, DM, HTN, HDL, CVA who presented to ED with worsening agitation. As part of work up, a head CT was obtained. This revealed a small left frontal SDH and possible right frontal SDH. NSY was called for further recommendations. Of note, patient has Eliquis on med list. Nursing in the process of calling to determine why he is taking this.  Imaging reviewed. There is a small, 12mm left frontal SDH and a questionable miniscule right frontal SDH. There is no mass effect, MLS or hydrocephalus. There is no role for surgical intervention for these minimal SDH.  Patient is going to be admitted under [facility] due to worsening agitation. Would rec repeating a head CT for monitoring, although should patient decompensate, with his multiple medical comorbidities, we would rec transition to comfort care. I do not believe patient needs to be transferred to [facility]. Hold all blood thinning medications.  Ferne Reus, PA-C [facility] [employer]

## 2019-06-02 ENCOUNTER — Observation Stay (HOSPITAL_COMMUNITY): Payer: Medicare HMO

## 2019-06-02 DIAGNOSIS — I120 Hypertensive chronic kidney disease with stage 5 chronic kidney disease or end stage renal disease: Secondary | ICD-10-CM | POA: Diagnosis not present

## 2019-06-02 DIAGNOSIS — E875 Hyperkalemia: Secondary | ICD-10-CM

## 2019-06-02 DIAGNOSIS — N186 End stage renal disease: Secondary | ICD-10-CM

## 2019-06-02 DIAGNOSIS — D631 Anemia in chronic kidney disease: Secondary | ICD-10-CM | POA: Diagnosis not present

## 2019-06-02 DIAGNOSIS — S065X9A Traumatic subdural hemorrhage with loss of consciousness of unspecified duration, initial encounter: Secondary | ICD-10-CM

## 2019-06-02 DIAGNOSIS — S065XAA Traumatic subdural hemorrhage with loss of consciousness status unknown, initial encounter: Secondary | ICD-10-CM

## 2019-06-02 DIAGNOSIS — I6200 Nontraumatic subdural hemorrhage, unspecified: Secondary | ICD-10-CM | POA: Diagnosis not present

## 2019-06-02 DIAGNOSIS — N250 Renal osteodystrophy: Secondary | ICD-10-CM | POA: Diagnosis not present

## 2019-06-02 DIAGNOSIS — Z992 Dependence on renal dialysis: Secondary | ICD-10-CM | POA: Diagnosis not present

## 2019-06-02 DIAGNOSIS — E1129 Type 2 diabetes mellitus with other diabetic kidney complication: Secondary | ICD-10-CM | POA: Diagnosis not present

## 2019-06-02 LAB — CBC
HCT: 31.6 % — ABNORMAL LOW (ref 39.0–52.0)
Hemoglobin: 9.7 g/dL — ABNORMAL LOW (ref 13.0–17.0)
MCH: 30.2 pg (ref 26.0–34.0)
MCHC: 30.7 g/dL (ref 30.0–36.0)
MCV: 98.4 fL (ref 80.0–100.0)
Platelets: 189 10*3/uL (ref 150–400)
RBC: 3.21 MIL/uL — ABNORMAL LOW (ref 4.22–5.81)
RDW: 16.5 % — ABNORMAL HIGH (ref 11.5–15.5)
WBC: 6.3 10*3/uL (ref 4.0–10.5)
nRBC: 0 % (ref 0.0–0.2)

## 2019-06-02 LAB — COMPREHENSIVE METABOLIC PANEL
ALT: 37 U/L (ref 0–44)
AST: 26 U/L (ref 15–41)
Albumin: 3.4 g/dL — ABNORMAL LOW (ref 3.5–5.0)
Alkaline Phosphatase: 55 U/L (ref 38–126)
Anion gap: 17 — ABNORMAL HIGH (ref 5–15)
BUN: 146 mg/dL — ABNORMAL HIGH (ref 8–23)
CO2: 17 mmol/L — ABNORMAL LOW (ref 22–32)
Calcium: 9.5 mg/dL (ref 8.9–10.3)
Chloride: 109 mmol/L (ref 98–111)
Creatinine, Ser: 17.06 mg/dL — ABNORMAL HIGH (ref 0.61–1.24)
GFR calc Af Amer: 3 mL/min — ABNORMAL LOW (ref >60)
GFR calc non Af Amer: 2 mL/min — ABNORMAL LOW (ref >60)
Glucose, Bld: 82 mg/dL (ref 70–99)
Potassium: 5.5 mmol/L — ABNORMAL HIGH (ref 3.5–5.1)
Sodium: 143 mmol/L (ref 135–145)
Total Bilirubin: 0.6 mg/dL (ref 0.3–1.2)
Total Protein: 6.5 g/dL (ref 6.5–8.1)

## 2019-06-02 LAB — BLOOD GAS, VENOUS
Acid-Base Excess: 0.4 mmol/L (ref 0.0–2.0)
Bicarbonate: 23.8 mmol/L (ref 20.0–28.0)
FIO2: 21
O2 Saturation: 43.4 %
Patient temperature: 98.2
pCO2, Ven: 41.7 mmHg — ABNORMAL LOW (ref 44.0–60.0)
pH, Ven: 7.391 (ref 7.250–7.430)
pO2, Ven: <31 mmHg — CL (ref 32.0–45.0)

## 2019-06-02 LAB — MRSA PCR SCREENING: MRSA by PCR: NEGATIVE

## 2019-06-02 LAB — GLUCOSE, CAPILLARY: Glucose-Capillary: 122 mg/dL — ABNORMAL HIGH (ref 70–99)

## 2019-06-02 MED ORDER — ALLOPURINOL 100 MG PO TABS: 100.0000 mg | ORAL_TABLET | ORAL | Status: DC

## 2019-06-02 MED ORDER — FLUMAZENIL 0.5 MG/5ML IV SOLN: 0.2000 mg | Freq: Once | INTRAVENOUS | Status: DC

## 2019-06-02 MED ORDER — LIDOCAINE HCL (PF) 1 % IJ SOLN: 5.0000 mL | INTRAMUSCULAR | Status: DC | PRN

## 2019-06-02 MED ORDER — SODIUM CHLORIDE 0.9 % IV SOLN: 100.0000 mL | INTRAVENOUS | Status: DC | PRN

## 2019-06-02 MED ORDER — ACETAMINOPHEN 325 MG PO TABS: 650.0000 mg | ORAL_TABLET | Freq: Four times a day (QID) | ORAL | Status: DC | PRN

## 2019-06-02 MED ORDER — VALPROATE SODIUM 500 MG/5ML IV SOLN
250.0000 mg | Freq: Two times a day (BID) | INTRAVENOUS | Status: DC
  Administered 2019-06-02 – 2019-06-03 (×3): 250 mg via INTRAVENOUS
  Filled 2019-06-02 (×6): qty 2.5

## 2019-06-02 MED ORDER — ACETAMINOPHEN 500 MG PO TABS: 500.0000 mg | ORAL_TABLET | Freq: Four times a day (QID) | ORAL | Status: DC | PRN

## 2019-06-02 MED ORDER — ONDANSETRON HCL 4 MG/2ML IJ SOLN: 4.0000 mg | Freq: Four times a day (QID) | INTRAMUSCULAR | Status: DC | PRN

## 2019-06-02 MED ORDER — LORAZEPAM 0.5 MG PO TABS
0.5000 mg | ORAL_TABLET | Freq: Four times a day (QID) | ORAL | Status: DC | PRN
  Administered 2019-06-02: 0.5 mg via ORAL
  Filled 2019-06-02: qty 1

## 2019-06-02 MED ORDER — SODIUM CHLORIDE 0.9% FLUSH
3.0000 mL | Freq: Two times a day (BID) | INTRAVENOUS | Status: DC
  Administered 2019-06-02 – 2019-06-03 (×3): 3 mL via INTRAVENOUS

## 2019-06-02 MED ORDER — CLONIDINE HCL 0.1 MG PO TABS: 0.1000 mg | ORAL_TABLET | Freq: Three times a day (TID) | ORAL | Status: DC | PRN

## 2019-06-02 MED ORDER — SODIUM CHLORIDE 0.9 % IV SOLN
250.0000 mL | INTRAVENOUS | Status: DC | PRN
  Administered 2019-06-03: 250 mL via INTRAVENOUS

## 2019-06-02 MED ORDER — OLANZAPINE 5 MG PO TABS
5.0000 mg | ORAL_TABLET | Freq: Every morning | ORAL | Status: DC
  Administered 2019-06-02 – 2019-06-04 (×2): 5 mg via ORAL
  Filled 2019-06-02 (×2): qty 1

## 2019-06-02 MED ORDER — HALOPERIDOL LACTATE 5 MG/ML IJ SOLN
2.0000 mg | Freq: Once | INTRAMUSCULAR | Status: AC
  Administered 2019-06-02: 2 mg via INTRAMUSCULAR
  Filled 2019-06-02: qty 1

## 2019-06-02 MED ORDER — VALPROATE SODIUM 500 MG/5ML IV SOLN
INTRAVENOUS | Status: AC
  Filled 2019-06-02: qty 5

## 2019-06-02 MED ORDER — SODIUM CHLORIDE 0.9% FLUSH
3.0000 mL | INTRAVENOUS | Status: DC | PRN
  Administered 2019-06-02: 3 mL via INTRAVENOUS

## 2019-06-02 MED ORDER — LORAZEPAM 2 MG/ML IJ SOLN
1.0000 mg | Freq: Once | INTRAMUSCULAR | Status: AC
  Administered 2019-06-02: 1 mg via INTRAVENOUS
  Filled 2019-06-02: qty 1

## 2019-06-02 MED ORDER — PENTAFLUOROPROP-TETRAFLUOROETH EX AERO: 1.0000 "application " | INHALATION_SPRAY | CUTANEOUS | Status: DC | PRN

## 2019-06-02 MED ORDER — HALOPERIDOL LACTATE 5 MG/ML IJ SOLN
5.0000 mg | Freq: Four times a day (QID) | INTRAMUSCULAR | Status: DC | PRN
  Filled 2019-06-02: qty 1

## 2019-06-02 MED ORDER — HALOPERIDOL LACTATE 5 MG/ML IJ SOLN
2.0000 mg | Freq: Four times a day (QID) | INTRAMUSCULAR | Status: DC | PRN
  Administered 2019-06-02: 2 mg via INTRAVENOUS
  Filled 2019-06-02: qty 1

## 2019-06-02 MED ORDER — ATORVASTATIN CALCIUM 40 MG PO TABS
40.0000 mg | ORAL_TABLET | Freq: Every day | ORAL | Status: DC
  Administered 2019-06-02 – 2019-06-03 (×2): 40 mg via ORAL
  Filled 2019-06-02 (×3): qty 1

## 2019-06-02 MED ORDER — SERTRALINE HCL 50 MG PO TABS
25.0000 mg | ORAL_TABLET | Freq: Every day | ORAL | Status: DC
  Administered 2019-06-02 – 2019-06-03 (×2): 25 mg via ORAL
  Filled 2019-06-02 (×3): qty 1

## 2019-06-02 MED ORDER — ACETAMINOPHEN 650 MG RE SUPP: 650.0000 mg | Freq: Four times a day (QID) | RECTAL | Status: DC | PRN

## 2019-06-02 MED ORDER — CHLORHEXIDINE GLUCONATE CLOTH 2 % EX PADS
6.0000 | MEDICATED_PAD | Freq: Every day | CUTANEOUS | Status: DC
  Administered 2019-06-02 – 2019-06-05 (×3): 6 via TOPICAL

## 2019-06-02 MED ORDER — SEVELAMER CARBONATE 800 MG PO TABS
800.0000 mg | ORAL_TABLET | Freq: Three times a day (TID) | ORAL | Status: DC
  Administered 2019-06-02 – 2019-06-03 (×2): 800 mg via ORAL
  Filled 2019-06-02 (×4): qty 1

## 2019-06-02 MED ORDER — ONDANSETRON HCL 4 MG PO TABS: 4.0000 mg | ORAL_TABLET | Freq: Four times a day (QID) | ORAL | Status: DC | PRN

## 2019-06-02 MED ORDER — LORAZEPAM 2 MG/ML IJ SOLN
2.0000 mg | Freq: Once | INTRAMUSCULAR | Status: DC
  Filled 2019-06-02: qty 1

## 2019-06-02 MED ORDER — FINASTERIDE 5 MG PO TABS
5.0000 mg | ORAL_TABLET | Freq: Every day | ORAL | Status: DC
  Administered 2019-06-02 – 2019-06-04 (×2): 5 mg via ORAL
  Filled 2019-06-02 (×3): qty 1

## 2019-06-02 MED ORDER — LIDOCAINE-PRILOCAINE 2.5-2.5 % EX CREA: 1.0000 "application " | TOPICAL_CREAM | CUTANEOUS | Status: DC | PRN

## 2019-06-02 NOTE — Progress Notes (Signed)
Patient combative, agitated and very restless and Dialysis nurse unable to dialyze patient. MD has been notified and received order for Lorazepam injection 1 mg Intravenous once. Lorazepam administered to patient per order. Also soft restraints non-violent ordered and has been applied to patients bilateral wrists.

## 2019-06-02 NOTE — Progress Notes (Addendum)
XCover note:  Rapid response called on patient for hypotension and change in mental status during dialysis. Patient had BP in 80's but quickly recovered into the 120s.  On my arrival, patient was laying in bed. He was not following commands, answering questions. Seemed to have a bilateral fixed gaze to left. Increased tone in all 4 extremities. No jerking motions noted. He continued in this state for approximately 10 minutes after which he began moving legs and moaning. At baseline, patient is confused and does not follow commands. Speech is dysarthric at baseline.    Pateint was admitted to the hospital with acute frontal subdural hematoma after having a fall measuring 35mm thickness. No mass effect noted. Scan was repeated after several hours and did not show any significant changes. Scans were reviewed with neurosurgery who did not feel that patient needed surgical intervention or any further follow up. He does have a history of dementia. He has ESRD on HD and had not been dialyzed in 5 days resulting in significant uremia and a BUN of 145 prior to the current treatment of dialysis.   In patient's current state, I suspect that he may have had a seizure. Currently, it appears that his symptoms have resolved. He may need to be started on keppra. Will consult tele neurology for input. Will check ABG. CBG was noted to be unremarkable.     Dr. Wynetta Emery updated on events.  Critical care procedure note Authorized and performed by: Kathie Dike Total critical care time: Approximately 30 minutes Due to high probability of clinically significant, life-threatening deterioration, the patient required my highest level of preparedness to intervene emergently and I personally spent this critical care time directly and personally managing the patient.  The critical care time included obtaining a history, examining the patient, pulse oximetry, ordering and review of studies, arranging urgent treatment with  development of a management plan, evaluation of patient's response to treatment, frequent reassessment, discussions with other providers.  Critical care time was performed to assess and manage the high probability of imminent, life-threatening deterioration that could result in multiorgan failure.  It was exclusive of separate billable procedures and treating other patients and teaching time.  Please see MDM section and the rest of the of note for further information on patient assessment and treatment   [employer]

## 2019-06-02 NOTE — Procedures (Signed)
     HEMODIALYSIS TREATMENT NOTE:   I spoke at length with outpatient HD facility manager Annye English RN who reports that, for months now, Mr. Iwan has been unable to complete his dialysis treatments due to agitation and uncooperative behavior.  She states patient has required close supervision, that he often tries to stand up during treatment, and that he becomes combative at times when attempts are made to correct unsafe behaviors.  On Wednesday 5/27 the patient presented to dialysis obtunded and drooling, unable to transfer into his recliner, which he can usually do with repeated coaxing and guidance.   Pre-dialysis today pt was lying naked in bed, with his legs draped over the side rails, attempting to sit up.  Sitter was present in room, guiding the patient back to a safe position.  He was wearing hand mitts.  He resisted placement of BP cuff and initial attempts to cannulate his LUE AVG; he turned onto his left arm and used his knees to push me away.  He is quite strong. Haldol and Ativan had been given 3 hours prior.  Primary RN obtained order for Ativan and soft-wrist restraints so that dialysis could be safely performed.  It took four staff members to place the restraints.     Short HD session with low flows was ordered d/t azotemia / concerns for disequilibrium.  Even with restraints, pt is high risk for infiltration as he still attempts to raise up on his elbows and rotate his torso onto his access arm.   He was hemodynamically stable for 2 hours and 15 minutes of treatment.  SBP 140 to 170 with HR 90-115.  With 15 minutes remaining, BP declined to 109/65 and UF was stopped.  Ten minutes later, pt was noted to be completely still with a far off gaze.   BP 89/59, pulse 99. Blood was returned at this point (5 min RTD) with a 400cc NS bolus.  Wrist restraints were released.  Rapid response team arrived to pt's bedside.  BP had improved to 132/69 pulse 107, but pt continued to have blank  stare, rigid arms, and clenched wrists.    Pt was assessed by Dr. Roderic Palau.  Orders were given to primary RN.  Tele-neurology consult pending.  Pt is now moving all extremities, snoring slightly with eyes closed.   166/82, pulse 111.    Total run time: 2 hours 25 minutes.  NET UF 1.9L.  BVP 31L.  Rockwell Alexandria, RN

## 2019-06-02 NOTE — Progress Notes (Signed)
  NEUROSURGERY PROGRESS NOTE   Repeat head CT reviewed and unchanged. There remains no role for NS intervention. I do not believe patient needs any further head CTs unless his exam changes. Continue to avoid blood thinning medications. Patient does not need outpatient follow up. Please call for any concerns.  Ferne Reus, PA-C [facility] [employer]

## 2019-06-02 NOTE — Progress Notes (Signed)
ASSUMPTION OF CARE NOTE   06/02/2019 3:41 PM  Miguel Tucker was seen and examined.  The H&P by the admitting provider, orders, imaging was reviewed.  Please see new orders.  Will continue to follow.   Vitals:   06/02/19 0559 06/02/19 1445  BP: (!) 150/79 (!) 159/72  Pulse: 91 98  Resp: 16   Temp: 98.5 F (36.9 C) 99 F (37.2 C)  SpO2:      Results for orders placed or performed during the hospital encounter of 06/01/19  SARS Coronavirus 2 by RT PCR (hospital order, performed in [place] hospital lab) Nasopharyngeal Nasopharyngeal Swab   Specimen: Nasopharyngeal Swab  Result Value Ref Range   SARS Coronavirus 2 NEGATIVE NEGATIVE  MRSA PCR Screening   Specimen: Nasopharyngeal  Result Value Ref Range   MRSA by PCR NEGATIVE NEGATIVE  CBC with Differential  Result Value Ref Range   WBC 8.0 4.0 - 10.5 K/uL   RBC 3.09 (L) 4.22 - 5.81 MIL/uL   Hemoglobin 9.7 (L) 13.0 - 17.0 g/dL   HCT 30.8 (L) 39.0 - 52.0 %   MCV 99.7 80.0 - 100.0 fL   MCH 31.4 26.0 - 34.0 pg   MCHC 31.5 30.0 - 36.0 g/dL   RDW 16.8 (H) 11.5 - 15.5 %   Platelets 186 150 - 400 K/uL   nRBC 0.0 0.0 - 0.2 %   Neutrophils Relative % 68 %   Neutro Abs 5.4 1.7 - 7.7 K/uL   Lymphocytes Relative 17 %   Lymphs Abs 1.4 0.7 - 4.0 K/uL   Monocytes Relative 11 %   Monocytes Absolute 0.9 0.1 - 1.0 K/uL   Eosinophils Relative 4 %   Eosinophils Absolute 0.3 0.0 - 0.5 K/uL   Basophils Relative 0 %   Basophils Absolute 0.0 0.0 - 0.1 K/uL   Immature Granulocytes 0 %   Abs Immature Granulocytes 0.03 0.00 - 0.07 K/uL  Comprehensive metabolic panel  Result Value Ref Range   Sodium 144 135 - 145 mmol/L   Potassium 5.2 (H) 3.5 - 5.1 mmol/L   Chloride 107 98 - 111 mmol/L   CO2 22 22 - 32 mmol/L   Glucose, Bld 102 (H) 70 - 99 mg/dL   BUN 136 (H) 8 - 23 mg/dL   Creatinine, Ser 16.70 (H) 0.61 - 1.24 mg/dL   Calcium 8.7 (L) 8.9 - 10.3 mg/dL   Total Protein 6.6 6.5 - 8.1 g/dL   Albumin 3.4 (L) 3.5 - 5.0 g/dL   AST 27  15 - 41 U/L   ALT 37 0 - 44 U/L   Alkaline Phosphatase 63 38 - 126 U/L   Total Bilirubin 0.5 0.3 - 1.2 mg/dL   GFR calc non Af Amer 2 (L) >60 mL/min   GFR calc Af Amer 3 (L) >60 mL/min   Anion gap 15 5 - 15  Urinalysis, Routine w reflex microscopic  Result Value Ref Range   Color, Urine STRAW (A) YELLOW   APPearance CLEAR CLEAR   Specific Gravity, Urine 1.011 1.005 - 1.030   pH 7.0 5.0 - 8.0   Glucose, UA 50 (A) NEGATIVE mg/dL   Hgb urine dipstick LARGE (A) NEGATIVE   Bilirubin Urine NEGATIVE NEGATIVE   Ketones, ur NEGATIVE NEGATIVE mg/dL   Protein, ur 100 (A) NEGATIVE mg/dL   Nitrite NEGATIVE NEGATIVE   Leukocytes,Ua NEGATIVE NEGATIVE   RBC / HPF 21-50 0 - 5 RBC/hpf   WBC, UA 11-20 0 - 5 WBC/hpf   Bacteria,  UA NONE SEEN NONE SEEN   Squamous Epithelial / LPF 0-5 0 - 5  CBC  Result Value Ref Range   WBC 6.3 4.0 - 10.5 K/uL   RBC 3.21 (L) 4.22 - 5.81 MIL/uL   Hemoglobin 9.7 (L) 13.0 - 17.0 g/dL   HCT 31.6 (L) 39.0 - 52.0 %   MCV 98.4 80.0 - 100.0 fL   MCH 30.2 26.0 - 34.0 pg   MCHC 30.7 30.0 - 36.0 g/dL   RDW 16.5 (H) 11.5 - 15.5 %   Platelets 189 150 - 400 K/uL   nRBC 0.0 0.0 - 0.2 %  Comprehensive metabolic panel  Result Value Ref Range   Sodium 143 135 - 145 mmol/L   Potassium 5.5 (H) 3.5 - 5.1 mmol/L   Chloride 109 98 - 111 mmol/L   CO2 17 (L) 22 - 32 mmol/L   Glucose, Bld 82 70 - 99 mg/dL   BUN 146 (H) 8 - 23 mg/dL   Creatinine, Ser 17.06 (H) 0.61 - 1.24 mg/dL   Calcium 9.5 8.9 - 10.3 mg/dL   Total Protein 6.5 6.5 - 8.1 g/dL   Albumin 3.4 (L) 3.5 - 5.0 g/dL   AST 26 15 - 41 U/L   ALT 37 0 - 44 U/L   Alkaline Phosphatase 55 38 - 126 U/L   Total Bilirubin 0.6 0.3 - 1.2 mg/dL   GFR calc non Af Amer 2 (L) >60 mL/min   GFR calc Af Amer 3 (L) >60 mL/min   Anion gap 17 (H) 5 - 15   C. Wynetta Emery, MD [facility]   06/01/2019  4:15 PM How to contact the [facility] Attending or Consulting provider [place] or covering provider during after hours [place], for this  patient?  1. Check the care team in [facility] and look for a) attending/consulting [facility] provider listed and b) the [facility] team listed 2. Log into [email or web address] and use [place]'s universal password to access. If you do not have the password, please contact the hospital operator. 3. Locate the [facility] provider you are looking for under [facility] and page to a number that you can be directly reached. 4. If you still have difficulty reaching the provider, please page the [facility] (Director on Call) for the Hospitalists listed on amion for assistance.

## 2019-06-02 NOTE — H&P (Signed)
TRH H&P    Patient Demographics:    Miguel Tucker, is a 86 y.o. male  MRN: [id number]  DOB - May 28, 1930  Admit Date - 06/01/2019  Referring MD/NP/PA: Evalee Jefferson  Outpatient Primary MD for the patient is [facility], Modena Nunnery, MD  Patient coming from: Home  Chief complaint-altered mental status   HPI:    Miguel Tucker  is a 86 y.o. male, with medical history of ESRD on hemodialysis MWF, diabetes mellitus type 2, dementia was brought to hospital for increasing confusion and agitation.  Patient lives at local nursing facility generally alert and oriented to person only at baseline however has had increasing agitation and not being cooperative for hemodialysis.  Last dialysis was on Monday.  He missed hemodialysis on Wednesday and Friday.  Patient fell at facility with bruising at right periorbital region. CT head was obtained which showed small left frontal subdural hematoma.  Neurosurgery Dr. Roseanne Reno was consulted and he reviewed the films.  Did not recommend surgical intervention or transfer to [facility].  He recommended observation and repeat CT head in a.m. Patient at this time is agitated, unable to provide any significant history.    Review of systems:    As in HPI, rest of review of systems is unobtainable    Past History of the following :    Past Medical History:  Diagnosis Date  . Asthma   . Chronic kidney disease    on dialysis  . Chronic knee pain   . Dementia ([place])   . Dementia ([place])   . Diabetes mellitus    type 2  . ESRD (end stage renal disease) ([place])   . Frequent urination at night   . Gout   . Hard of hearing   . High cholesterol   . Hypertension   . Hypokalemia   . Radicular pain of left lower extremity       Past Surgical History:  Procedure Laterality Date  . APPENDECTOMY    . AV FISTULA PLACEMENT Left 10/18/2017   Procedure: INSERTION OF ARTERIOVENOUS (AV)  GORE-TEX GRAFT LEFT UPPER  ARM;  Surgeon: Waynetta Sandy, MD;  Location: [place];  Service: Vascular;  Laterality: Left;  . BASCILIC VEIN TRANSPOSITION Left 09/09/2015   Procedure: FIRST STAGE BRACHIAL VEIN TRANSPOSITION LEFT ARM;  Surgeon: Elam Dutch, MD;  Location: [place];  Service: Vascular;  Laterality: Left;  . BASCILIC VEIN TRANSPOSITION Left 02/20/2016   Procedure: LEFT ARM SECOND STAGE BASILIC VEIN TRANSPOSITION;  Surgeon: Rosetta Posner, MD;  Location: [place];  Service: Vascular;  Laterality: Left;  . ESOPHAGOGASTRODUODENOSCOPY N/A 11/27/2017   Procedure: ESOPHAGOGASTRODUODENOSCOPY (EGD);  Surgeon: Rogene Houston, MD;  [facility];  Service: Endoscopy;  Laterality: N/A;  . INSERTION OF DIALYSIS CATHETER Right 10/18/2017   Procedure: INSERTION OF TUNNELED  DIALYSIS CATHETER RIGHT INTERNAL JUGULAR;  Surgeon: Waynetta Sandy, MD;  Location: [place];  Service: Vascular;  Laterality: Right;  . OTHER SURGICAL HISTORY     testicular tumor- benign   . ROTATOR CUFF REPAIR  Right   . WOUND DEBRIDEMENT  06/01/2011   Procedure: DEBRIDEMENT ABDOMINAL WOUND;  Surgeon: [name], MD;  Location: [facility];  Service: General;  Laterality: N/A;  Remove Sutures       Social History:      Social History   Tobacco Use  . Smoking status: Former Smoker    Quit date: 11/06/1975    Years since quitting: 43.6  . Smokeless tobacco: Never Used  Substance Use Topics  . Alcohol use: No       Family History :   Unobtainable, patient is confused and agitated   Home Medications:   Prior to Admission medications   Medication Sig Start Date End Date Taking? Authorizing Provider  acetaminophen (TYLENOL) 500 MG tablet Take 500 mg by mouth in the morning, at noon, and at bedtime.   Yes [provider]  albuterol (VENTOLIN HFA) 108 (90 Base) MCG/ACT inhaler Inhale 1-2 puffs into the lungs every 6 (six) hours as needed for wheezing or shortness of breath.   Yes  [provider]  allopurinol (ZYLOPRIM) 100 MG tablet Take 100 mg by mouth every Monday, Wednesday, and Friday.  07/23/15  Yes [provider]  Amino Acids-Protein Hydrolys (PRO-STAT PO) Take 30 mLs by mouth in the morning and at bedtime.   Yes [provider]  aspirin EC 81 MG tablet Take 1 tablet (81 mg total) by mouth every morning. 12/03/17  Yes Johnson, Clanford L, MD  atorvastatin (LIPITOR) 40 MG tablet TAKE 1 TABLET BY MOUTH ONCE DAILY AT 6PM Patient taking differently: Take 40 mg by mouth at bedtime.  02/21/18  Yes [place], Modena Nunnery, MD  b complex-vitamin c-folic acid (NEPHRO-VITE) 0.8 MG TABS tablet Take 1 tablet by mouth at bedtime.   Yes [provider]  cloNIDine (CATAPRES) 0.1 MG tablet Take 0.1 mg by mouth every 8 (eight) hours as needed (for systolic >272 or diastolic > 90).    Yes [provider]  colchicine 0.6 MG tablet TAKE 1 TABLET BY MOUTH ONCE DAILY. Patient taking differently: Take 0.3 mg by mouth daily.  02/21/18  Yes [place], Modena Nunnery, MD  finasteride (PROSCAR) 5 MG tablet Take 5 mg by mouth daily.   Yes [provider]  loratadine (CLARITIN) 10 MG tablet Take 10 mg by mouth daily.   Yes [provider]  LORazepam (ATIVAN) 0.5 MG tablet Take 0.5 mg by mouth every Monday, Wednesday, and Friday with hemodialysis. Take 0.5 mg by mouth in the morning on Mon/Wed/Fri for anxiety- prior to dialysis    Yes [provider]  LORazepam (ATIVAN) 1 MG tablet Take 1 mg by mouth once.   Yes [provider]  OLANZapine (ZYPREXA) 5 MG tablet Take 5 mg by mouth in the morning.   Yes [provider]  sertraline (ZOLOFT) 25 MG tablet Take 25 mg by mouth at bedtime.   Yes [provider]  sevelamer (RENAGEL) 800 MG tablet Take 800 mg by mouth 3 (three) times daily. *May give one tablet as needed with any snack   Yes [provider]  NONFORMULARY OR COMPOUNDED ITEM Apply 1 application  topically every Monday, Wednesday, and Friday. Applied to arms and back for anxiety 0.83ml=1mg     [provider]     Allergies:     Allergies  Allergen Reactions  . Penicillins Itching and Other (See Comments)    On arms only-not listed on current [facility]     Physical Exam:   Vitals  Blood pressure (!) 163/71, pulse 87, temperature 97.8 F (36.6 C), temperature source Oral, resp. rate 19, height 5\' 5"  (1.651 m), weight 72 kg, SpO2 100 %.  1.  General: Appears agitated  2. Psychiatric: Alert, agitated, not oriented x3  3. Neurologic: Moving all extremities, alert  4. HEENMT:  Right periorbital contusion  5. Respiratory : Clear to auscultation bilaterally, no wheezing or crackles auscultated  6. Cardiovascular : S1-S2, regular, no murmur auscultated  7. Gastrointestinal:  Abdomen is soft, nontender, no organomegaly     Data Review:    CBC Recent Labs  Lab 05/30/19 1802 06/01/19 1814  WBC  --  8.0  HGB 9.9* 9.7*  HCT 29.0* 30.8*  PLT  --  186  MCV  --  99.7  MCH  --  31.4  MCHC  --  31.5  RDW  --  16.8*  LYMPHSABS  --  1.4  MONOABS  --  0.9  EOSABS  --  0.3  BASOSABS  --  0.0   ------------------------------------------------------------------------------------------------------------------  Results for orders placed or performed during the hospital encounter of 06/01/19 (from the past 48 hour(s))  CBC with Differential     Status: Abnormal   Collection Time: 06/01/19  6:14 PM  Result Value Ref Range   WBC 8.0 4.0 - 10.5 K/uL   RBC 3.09 (L) 4.22 - 5.81 MIL/uL   Hemoglobin 9.7 (L) 13.0 - 17.0 g/dL   HCT 30.8 (L) 39.0 - 52.0 %   MCV 99.7 80.0 - 100.0 fL   MCH 31.4 26.0 - 34.0 pg   MCHC 31.5 30.0 - 36.0 g/dL   RDW 16.8 (H) 11.5 - 15.5 %   Platelets 186 150 - 400 K/uL   nRBC 0.0 0.0 - 0.2 %   Neutrophils Relative % 68 %   Neutro Abs 5.4 1.7 - 7.7 K/uL   Lymphocytes Relative 17 %   Lymphs Abs 1.4 0.7 - 4.0 K/uL   Monocytes Relative 11 %    Monocytes Absolute 0.9 0.1 - 1.0 K/uL   Eosinophils Relative 4 %   Eosinophils Absolute 0.3 0.0 - 0.5 K/uL   Basophils Relative 0 %   Basophils Absolute 0.0 0.0 - 0.1 K/uL   Immature Granulocytes 0 %   Abs Immature Granulocytes 0.03 0.00 - 0.07 K/uL    Comment: Performed at [facility], [address], [place], [place] [address]  Comprehensive metabolic panel     Status: Abnormal   Collection Time: 06/01/19  6:14 PM  Result Value Ref Range   Sodium 144 135 - 145 mmol/L   Potassium 5.2 (H) 3.5 - 5.1 mmol/L   Chloride 107 98 - 111 mmol/L   CO2 22 22 - 32 mmol/L   Glucose, Bld 102 (H) 70 - 99 mg/dL    Comment: Glucose reference range applies only to samples taken after fasting for at least 8 hours.   BUN 136 (H) 8 - 23 mg/dL    Comment: RESULTS CONFIRMED BY MANUAL DILUTION   Creatinine, Ser 16.70 (H) 0.61 - 1.24 mg/dL   Calcium 8.7 (L) 8.9 - 10.3 mg/dL   Total Protein 6.6 6.5 - 8.1 g/dL   Albumin 3.4 (L) 3.5 - 5.0 g/dL   AST 27 15 - 41 U/L   ALT 37 0 - 44 U/L   Alkaline Phosphatase 63 38 - 126 U/L   Total Bilirubin 0.5 0.3 - 1.2 mg/dL   GFR calc non Af Amer 2 (L) >60 mL/min   GFR calc Af  Amer 3 (L) >60 mL/min   Anion gap 15 5 - 15    Comment: Performed at [facility], [address], [place], [place] [address]  Urinalysis, Routine w reflex microscopic     Status: Abnormal   Collection Time: 06/01/19  6:34 PM  Result Value Ref Range   Color, Urine STRAW (A) YELLOW   APPearance CLEAR CLEAR   Specific Gravity, Urine 1.011 1.005 - 1.030   pH 7.0 5.0 - 8.0   Glucose, UA 50 (A) NEGATIVE mg/dL   Hgb urine dipstick LARGE (A) NEGATIVE   Bilirubin Urine NEGATIVE NEGATIVE   Ketones, ur NEGATIVE NEGATIVE mg/dL   Protein, ur 100 (A) NEGATIVE mg/dL   Nitrite NEGATIVE NEGATIVE   Leukocytes,Ua NEGATIVE NEGATIVE   RBC / HPF 21-50 0 - 5 RBC/hpf   WBC, UA 11-20 0 - 5 WBC/hpf   Bacteria, UA NONE SEEN NONE SEEN   Squamous Epithelial / LPF 0-5 0 - 5    Comment: Performed at [facility], [address], [place], [place] [address]  SARS Coronavirus 2 by RT PCR (hospital order, performed in [place] hospital lab) Nasopharyngeal Nasopharyngeal Swab     Status: None   Collection Time: 06/01/19  9:05 PM   Specimen: Nasopharyngeal Swab  Result Value Ref Range   SARS Coronavirus 2 NEGATIVE NEGATIVE    Comment: (NOTE) SARS-CoV-2 target nucleic acids are NOT DETECTED. The SARS-CoV-2 RNA is generally detectable in upper and lower respiratory specimens during the acute phase of infection. The lowest concentration of SARS-CoV-2 viral copies this assay can detect is 250 copies / mL. A negative result does not preclude SARS-CoV-2 infection and should not be used as the sole basis for treatment or other patient management decisions.  A negative result may occur with improper specimen collection / handling, submission of specimen other than nasopharyngeal swab, presence of viral mutation(s) within the areas targeted by this assay, and inadequate number of viral copies (<250 copies / mL). A negative result must be combined with clinical observations, patient history, and epidemiological information. Fact Sheet for Patients:   StrictlyIdeas.no Fact Sheet for Healthcare Providers: BankingDealers.co.za This test is not yet approved or cleared  by the [place] FDA and has been authorized for detection and/or diagnosis of SARS-CoV-2 by FDA under an Emergency Use Authorization (EUA).  This EUA will remain in effect (meaning this test can be used) for the duration of the COVID-19 declaration under Section 564(b)(1) of the Act, 21 U.S.C. section 360bbb-3(b)(1), unless the authorization is terminated or revoked sooner. Performed at [facility], [address] [place], [place] [address]     Chemistries  Recent Labs  Lab 05/30/19 1802 06/01/19 1814  NA 140 144  K 5.2* 5.2*  CL 112* 107  CO2  --  22  GLUCOSE 120* 102*  BUN 113*  136*  CREATININE 17.80* 16.70*  CALCIUM  --  8.7*  AST  --  27  ALT  --  37  ALKPHOS  --  63  BILITOT  --  0.5   ------------------------------------------------------------------------------------------------------------------  ------------------------------------------------------------------------------------------------------------------ GFR: Estimated Creatinine Clearance: 2.7 mL/min (A) (by C-G formula based on SCr of 16.7 mg/dL (H)). Liver Function Tests: Recent Labs  Lab 06/01/19 1814  AST 27  ALT 37  ALKPHOS 63  BILITOT 0.5  PROT 6.6  ALBUMIN 3.4*   No results for input(s): LIPASE, AMYLASE in the last 168 hours. No results for input(s): AMMONIA in the last 168 hours. Coagulation Profile: No results for  input(s): INR, PROTIME in the last 168 hours. Cardiac Enzymes: No results for input(s): CKTOTAL, CKMB, CKMBINDEX, TROPONINI in the last 168 hours. BNP (last 3 results) No results for input(s): PROBNP in the last 8760 hours. HbA1C: No results for input(s): HGBA1C in the last 72 hours. CBG: No results for input(s): GLUCAP in the last 168 hours. Lipid Profile: No results for input(s): CHOL, HDL, LDLCALC, TRIG, CHOLHDL, LDLDIRECT in the last 72 hours. Thyroid Function Tests: No results for input(s): TSH, T4TOTAL, FREET4, T3FREE, THYROIDAB in the last 72 hours. Anemia Panel: No results for input(s): VITAMINB12, FOLATE, FERRITIN, TIBC, IRON, RETICCTPCT in the last 72 hours.  --------------------------------------------------------------------------------------------------------------- Urine analysis:    Component Value Date/Time   COLORURINE STRAW (A) 06/01/2019 1834   APPEARANCEUR CLEAR 06/01/2019 1834   LABSPEC 1.011 06/01/2019 1834   PHURINE 7.0 06/01/2019 1834   GLUCOSEU 50 (A) 06/01/2019 1834   HGBUR LARGE (A) 06/01/2019 1834   BILIRUBINUR NEGATIVE 06/01/2019 1834   KETONESUR NEGATIVE 06/01/2019 1834   PROTEINUR 100 (A) 06/01/2019 1834   UROBILINOGEN 0.2  02/08/2008 0204   NITRITE NEGATIVE 06/01/2019 1834   LEUKOCYTESUR NEGATIVE 06/01/2019 1834      Imaging Results:    CT Head Wo Contrast  Result Date: 06/01/2019 CLINICAL DATA:  Altered mental status. Head trauma, minor (Age >= 65y) EXAM: CT HEAD WITHOUT CONTRAST TECHNIQUE: Contiguous axial images were obtained from the base of the skull through the vertex without intravenous contrast. COMPARISON:  Head CT 04/24/2019, brain MRI 12/13/2017 FINDINGS: Brain: Acute left frontal subdural hematoma measures up to 6 mm in thickness. Possible tiny acute right frontal subdural hematoma, series 2, image 23. No significant mass effect. There is no midline shift. Advanced atrophy and prominence of the extra-axial CSF spaces, stable from prior exam. No subarachnoid or intraparenchymal blood. No hydrocephalus. Stable degree of generalized atrophy. Basilar cisterns are patent. Vascular: Atherosclerosis of skullbase vasculature without hyperdense vessel or abnormal calcification. Skull: No fracture or focal lesion. Sinuses/Orbits: Trace mucosal thickening in left maxillary sinus. Paranasal sinuses otherwise clear. The mastoid air cells are clear. Other: None. IMPRESSION: 1. Acute small left frontal subdural hematoma measures up to 6 mm in thickness. No significant mass effect. No midline shift. 2. Possible tiny acute right frontal subdural hematoma. 3. Advanced atrophy and prominence of the extra-axial CSF spaces, stable from prior exam. These results were called by telephone at the time of interpretation on 06/01/2019 at 8:16 pm to Dr Tomi Bamberger , who verbally acknowledged these results. Electronically Signed   By: Keith Rake [name]   On: 06/01/2019 20:16       Assessment & Plan:    Active Problems:   SDH (subdural hematoma) (HCC)   1. Subdural hematoma-patient has acute small left frontal some hematoma measuring up to 6 mm thickness.  No significant mass effect, no midline shift.  Also has tiny acute right  frontal subdural hematoma.  Neurosurgery has reviewed the CT scan films, no surgical intervention recommended.  They recommend to repeat CT scan in a.m.  Will obtain CT head in a.m.  Neurochecks every 4 hours.  Will hold aspirin. 2. Altered mental status-patient has history of dementia, is agitated at baseline.  Did not improve with Ativan.  Will try Haldol 2 mg IM x1.  QTC on EKG was 482 today.  Continue home dose olanzapine as prescribed, from tomorrow morning. 3. ESRD on HD-patient missed hemodialysis on Wednesday and Friday due to agitation.  Patient's BUN is 136, likely has uremic component for  his agitation.  Called and discussed with nephrology Dr. Justin Mend, he will arrange for hemodialysis in the morning.  No need for immediate dialysis. 4. Hypertension-continue Catapres as needed. 5. Hyperlipidemia-continue statin.    DVT Prophylaxis-   SCDs  AM Labs Ordered, also please review Full Orders  Family Communication: No family at bedside  Code Status: Full code  Admission status: Observation  Time spent in minutes : 60 minutes   Filiberto Wamble S Kristoffer Bala M.D

## 2019-06-02 NOTE — Consult Note (Signed)
Referring Provider: No ref. provider found Primary Care Physician:  [name], MD Primary Nephrologist:  Dr.  Marval Regal  Reason for Consultation:   Medical management end-stage renal disease, assessment treatment of anemia, management of bone mineral, maintenance of euvolemia.  HPI: This is an 86 year old gentleman with a history of dementia diabetes mellitus type 2 end-stage renal disease dialysis Monday Wednesday Friday.  Was brought to the emergency room from a local nursing facility for increasing confusion and agitation.  His last dialysis was Monday, 05/28/2019.  Patient fell at the facility with bruising of the right periorbital region.  A CT head was obtained and showed a small left frontal subdural hematoma.  Neurosurgery was consulted and did not recommend surgical intervention and transferred to [facility].  Blood pressure 150/79 pulse 91 temperature 98.5 O2 sats 90% room air  Sodium 144 potassium 5.2 chloride 107 CO2 22 BUN 136 creatinine 16.7 glucose 102 albumin 3.4 hemoglobin 9.7  Atorvastatin 40 mg daily Proscar 5 mg daily Haldol 2 mg IM as needed, Zyprexa 5 mg nightly Zoloft 25 mg daily Renvela 800 mg with meals    Past Medical History:  Diagnosis Date  . Asthma   . Chronic kidney disease    on dialysis  . Chronic knee pain   . Dementia ([place])   . Dementia ([place])   . Diabetes mellitus    type 2  . ESRD (end stage renal disease) ([place])   . Frequent urination at night   . Gout   . Hard of hearing   . High cholesterol   . Hypertension   . Hypokalemia   . Radicular pain of left lower extremity     Past Surgical History:  Procedure Laterality Date  . APPENDECTOMY    . AV FISTULA PLACEMENT Left 10/18/2017   Procedure: INSERTION OF ARTERIOVENOUS (AV) GORE-TEX GRAFT LEFT UPPER  ARM;  Surgeon: Waynetta Sandy, MD;  Location: [place];  Service: Vascular;  Laterality: Left;  . BASCILIC VEIN TRANSPOSITION Left 09/09/2015   Procedure: FIRST STAGE  BRACHIAL VEIN TRANSPOSITION LEFT ARM;  Surgeon: Elam Dutch, MD;  Location: [place];  Service: Vascular;  Laterality: Left;  . BASCILIC VEIN TRANSPOSITION Left 02/20/2016   Procedure: LEFT ARM SECOND STAGE BASILIC VEIN TRANSPOSITION;  Surgeon: Rosetta Posner, MD;  Location: [place];  Service: Vascular;  Laterality: Left;  . ESOPHAGOGASTRODUODENOSCOPY N/A 11/27/2017   Procedure: ESOPHAGOGASTRODUODENOSCOPY (EGD);  Surgeon: Rogene Houston, MD;  [facility];  Service: Endoscopy;  Laterality: N/A;  . INSERTION OF DIALYSIS CATHETER Right 10/18/2017   Procedure: INSERTION OF TUNNELED  DIALYSIS CATHETER RIGHT INTERNAL JUGULAR;  Surgeon: Waynetta Sandy, MD;  Location: [place];  Service: Vascular;  Laterality: Right;  . OTHER SURGICAL HISTORY     testicular tumor- benign   . ROTATOR CUFF REPAIR Right   . WOUND DEBRIDEMENT  06/01/2011   Procedure: DEBRIDEMENT ABDOMINAL WOUND;  Surgeon: [name], MD;  Location: [facility];  Service: General;  Laterality: N/A;  Remove Sutures     Prior to Admission medications   Medication Sig Start Date End Date Taking? Authorizing Provider  acetaminophen (TYLENOL) 500 MG tablet Take 500 mg by mouth in the morning, at noon, and at bedtime.   Yes [provider]  albuterol (VENTOLIN HFA) 108 (90 Base) MCG/ACT inhaler Inhale 1-2 puffs into the lungs every 6 (six) hours as needed for wheezing or shortness of breath.   Yes [provider]  allopurinol (ZYLOPRIM)  100 MG tablet Take 100 mg by mouth every Monday, Wednesday, and Friday.  07/23/15  Yes [provider]  Amino Acids-Protein Hydrolys (PRO-STAT PO) Take 30 mLs by mouth in the morning and at bedtime.   Yes [provider]  aspirin EC 81 MG tablet Take 1 tablet (81 mg total) by mouth every morning. 12/03/17  Yes Johnson, Clanford L, MD  atorvastatin (LIPITOR) 40 MG tablet TAKE 1 TABLET BY MOUTH ONCE DAILY AT 6PM Patient taking differently: Take 40 mg by mouth at  bedtime.  02/21/18  Yes [place], Modena Nunnery, MD  b complex-vitamin c-folic acid (NEPHRO-VITE) 0.8 MG TABS tablet Take 1 tablet by mouth at bedtime.   Yes [provider]  cloNIDine (CATAPRES) 0.1 MG tablet Take 0.1 mg by mouth every 8 (eight) hours as needed (for systolic >364 or diastolic > 90).    Yes [provider]  colchicine 0.6 MG tablet TAKE 1 TABLET BY MOUTH ONCE DAILY. Patient taking differently: Take 0.3 mg by mouth daily.  02/21/18  Yes [place], Modena Nunnery, MD  finasteride (PROSCAR) 5 MG tablet Take 5 mg by mouth daily.   Yes [provider]  loratadine (CLARITIN) 10 MG tablet Take 10 mg by mouth daily.   Yes [provider]  LORazepam (ATIVAN) 0.5 MG tablet Take 0.5 mg by mouth every Monday, Wednesday, and Friday with hemodialysis. Take 0.5 mg by mouth in the morning on Mon/Wed/Fri for anxiety- prior to dialysis    Yes [provider]  LORazepam (ATIVAN) 1 MG tablet Take 1 mg by mouth once.   Yes [provider]  OLANZapine (ZYPREXA) 5 MG tablet Take 5 mg by mouth in the morning.   Yes [provider]  sertraline (ZOLOFT) 25 MG tablet Take 25 mg by mouth at bedtime.   Yes [provider]  sevelamer (RENAGEL) 800 MG tablet Take 800 mg by mouth 3 (three) times daily. *May give one tablet as needed with any snack   Yes [provider]  NONFORMULARY OR COMPOUNDED ITEM Apply 1 application topically every Monday, Wednesday, and Friday. Applied to arms and back for anxiety 0.43ml=1mg     [provider]    Current Facility-Administered Medications  Medication Dose Route Frequency Provider Last Rate Last Admin  . 0.9 %  sodium chloride infusion  250 mL Intravenous PRN Oswald Hillock, MD      . acetaminophen (TYLENOL) tablet 650 mg  650 mg Oral Q6H PRN Oswald Hillock, MD       Or  . acetaminophen (TYLENOL) suppository 650 mg  650 mg Rectal Q6H PRN Oswald Hillock, MD      . acetaminophen (TYLENOL) tablet 500 mg   500 mg Oral Q6H PRN Oswald Hillock, MD      . Derrill Memo ON 06/04/2019] allopurinol (ZYLOPRIM) tablet 100 mg  100 mg Oral Q M,W,F Darrick Meigs, Gagan S, MD      . atorvastatin (LIPITOR) tablet 40 mg  40 mg Oral QHS Oswald Hillock, MD      . Chlorhexidine Gluconate Cloth 2 % PADS 6 each  6 each Topical Q0600 Edrick Oh, MD      . cloNIDine (CATAPRES) tablet 0.1 mg  0.1 mg Oral Q8H PRN Oswald Hillock, MD      . finasteride (PROSCAR) tablet 5 mg  5 mg Oral Daily Darrick Meigs, Gagan S, MD      . haloperidol lactate (HALDOL) injection 2 mg  2 mg Intramuscular Once [place]  S, MD      . OLANZapine (ZYPREXA) tablet 5 mg  5 mg Oral q AM Darrick Meigs, Marge Duncans, MD      . ondansetron [facility]) tablet 4 mg  4 mg Oral Q6H PRN Oswald Hillock, MD       Or  . ondansetron (ZOFRAN) injection 4 mg  4 mg Intravenous Q6H PRN Oswald Hillock, MD      . sertraline (ZOLOFT) tablet 25 mg  25 mg Oral QHS Darrick Meigs, Marge Duncans, MD      . sevelamer carbonate (RENVELA) tablet 800 mg  800 mg Oral TID WC Darrick Meigs, Marge Duncans, MD      . sodium chloride flush (NS) 0.9 % injection 3 mL  3 mL Intravenous Q12H Darrick Meigs, Marge Duncans, MD      . sodium chloride flush (NS) 0.9 % injection 3 mL  3 mL Intravenous PRN Oswald Hillock, MD        Allergies as of 06/01/2019 - Review Complete 06/01/2019  Allergen Reaction Noted  . Penicillins Itching and Other (See Comments) 10/30/2010    No family history on file.  Social History   Socioeconomic History  . Marital status: Married    Spouse name: Not on file  . Number of children: 3  . Years of education: 10  . Highest education level: Not on file  Occupational History  . Occupation: Retired  Tobacco Use  . Smoking status: Former Smoker    Quit date: 11/06/1975    Years since quitting: 43.6  . Smokeless tobacco: Never Used  Substance and Sexual Activity  . Alcohol use: No  . Drug use: No  . Sexual activity: Not on file  Other Topics Concern  . Not on file  Social History Narrative   Lives with spouse   Caffeine use:  none    Right handed   Social Determinants of Health   Financial Resource Strain:   . Difficulty of Paying Living Expenses:   Food Insecurity:   . Worried About Charity fundraiser in the Last Year:   . Arboriculturist in the Last Year:   Transportation Needs:   . Film/video editor (Medical):   [name] Lack of Transportation (Non-Medical):   Physical Activity:   . Days of Exercise per Week:   . Minutes of Exercise per Session:   Stress:   . Feeling of Stress :   Social Connections:   . Frequency of Communication with Friends and Family:   . Frequency of Social Gatherings with Friends and Family:   . Attends Religious Services:   . Active Member of Clubs or Organizations:   . Attends Archivist Meetings:   [name] Marital Status:   Intimate Partner Violence:   . Fear of Current or Ex-Partner:   . Emotionally Abused:   [name] Physically Abused:   . Sexually Abused:     Review of Systems: Unable to obtain secondary to altered mental status  Physical Exam: Vital signs in last 24 hours: Temp:  [97.8 F (36.6 C)-98.5 F (36.9 C)] 98.5 F (36.9 C) (05/29 0559) Pulse Rate:  [86-99] 91 (05/29 0559) Resp:  [14-21] 16 (05/29 0559) BP: (150-174)/(65-112) 150/79 (05/29 0559) SpO2:  [97 %-100 %] 100 % (05/29 0151) Weight:  [72 kg] 72 kg (05/28 1619)   General: Elderly debilitated gentleman Head:  Normocephalic and atraumatic. Eyes:  Sclera clear, no icterus.   Right periorbital hematoma Ears:  Normal auditory acuity. Nose:  No  deformity, discharge,  or lesions. Mouth:  No deformity or lesions, dentition normal. Neck:  Supple; no masses or thyromegaly. JVP not elevated.  Right IJ catheter Lungs: Diminished air entry poor effort Heart:  Regular rate and rhythm .  Faint systolic murmur 3-6 Abdomen:  Soft, nontender and nondistended. No masses, hepatosplenomegaly or hernias noted. Normal bowel sounds, without guarding, and without rebound.   Msk:  Symmetrical without gross deformities.  Normal posture. Pulses:  No carotid, renal, femoral bruits. DP and PT symmetrical and equal Extremities:  Without clubbing or edema.  adenopathy.   Intake/Output from previous day: No intake/output data recorded. Intake/Output this shift: No intake/output data recorded.  Lab Results: Recent Labs    05/30/19 1802 06/01/19 1814  WBC  --  8.0  HGB 9.9* 9.7*  HCT 29.0* 30.8*  PLT  --  186   BMET Recent Labs    05/30/19 1802 06/01/19 1814  NA 140 144  K 5.2* 5.2*  CL 112* 107  CO2  --  22  GLUCOSE 120* 102*  BUN 113* 136*  CREATININE 17.80* 16.70*  CALCIUM  --  8.7*   LFT Recent Labs    06/01/19 1814  PROT 6.6  ALBUMIN 3.4*  AST 27  ALT 37  ALKPHOS 63  BILITOT 0.5   PT/INR No results for input(s): LABPROT, INR in the last 72 hours. Hepatitis Panel No results for input(s): HEPBSAG, HCVAB, HEPAIGM, HEPBIGM in the last 72 hours.  Studies/Results: CT Head Wo Contrast  Result Date: 06/01/2019 CLINICAL DATA:  Altered mental status. Head trauma, minor (Age >= 65y) EXAM: CT HEAD WITHOUT CONTRAST TECHNIQUE: Contiguous axial images were obtained from the base of the skull through the vertex without intravenous contrast. COMPARISON:  Head CT 04/24/2019, brain MRI 12/13/2017 FINDINGS: Brain: Acute left frontal subdural hematoma measures up to 6 mm in thickness. Possible tiny acute right frontal subdural hematoma, series 2, image 23. No significant mass effect. There is no midline shift. Advanced atrophy and prominence of the extra-axial CSF spaces, stable from prior exam. No subarachnoid or intraparenchymal blood. No hydrocephalus. Stable degree of generalized atrophy. Basilar cisterns are patent. Vascular: Atherosclerosis of skullbase vasculature without hyperdense vessel or abnormal calcification. Skull: No fracture or focal lesion. Sinuses/Orbits: Trace mucosal thickening in left maxillary sinus. Paranasal sinuses otherwise clear. The mastoid air cells are clear. Other: None.  IMPRESSION: 1. Acute small left frontal subdural hematoma measures up to 6 mm in thickness. No significant mass effect. No midline shift. 2. Possible tiny acute right frontal subdural hematoma. 3. Advanced atrophy and prominence of the extra-axial CSF spaces, stable from prior exam. These results were called by telephone at the time of interpretation on 06/01/2019 at 8:16 pm to Dr Tomi Bamberger , who verbally acknowledged these results. Electronically Signed   By: Keith Rake [name]   On: 06/01/2019 20:16    Assessment/Plan: Dialysis [facility] 3 times a week blood flow 400 dialysis flow rate 800 duration 4 hours potassium 2 calcium 2.25 estimated dry weight 71  Patient has missed the 5 treatments last 30 days  His average weight gains appear to be between 1 and 2 L.  Medications micera 50 mcg every 2 weeks, Venofer 100 mg q. treatment, calcitriol 2 mcg 3 times weekly with dialysis  1.  End-stage renal disease Monday Wednesday Friday [facility] patient appears to have frequently missed dialysis over the past month and comes in with relatively high BUN.  He has a history of dementia but  has had some worsening in his mental status.  It may be prudent to involve palliative medicine at this point.  We will also attempt to dialyze him slowly to avoid disequilibrium.  He would be preferable to dialyze him daily at this point to see if we can obtain any improvement in mental status 2.  Hypertension appears well-controlled does not appear to be an issue at this point.   continued in the hospital. 3.  Anemia we will continue to follow.  Patient was receiving IV iron as well as ESA. 4.  Metabolic bone disease continue Renvela if patient is taking oral. 5.  Diabetes mellitus as per primary service 6.  Status post fall with subdural hematoma neurosurgery consulted no surgical intervention 7.  Dementia would recommend the patient is evaluated by palliative medicine.   LOS: 0 Sherril Croon @TODAY @6 :33 AM

## 2019-06-02 NOTE — Progress Notes (Signed)
06/02/2019 7:01 PM  I updated sons Elza and Jori Moll and will have a family meeting by telephone with them tomorrow at 2:30 pm.  [place]  Will discuss hospice/comfort care vs trying to continue full scope care.   Murvin Natal MD

## 2019-06-02 NOTE — Consult Note (Signed)
[facility] Tele[facility]urology Consult Services  Stat Consult  Date of Service:   06/02/2019 18:45:18  Impression:     .  G93.49 - Encephalopathy Multifactorial  Comments/Sign-Out: 86 y.o. male, with medical history of ESRD on hemodialysis MWF, diabetes mellitus type 2, dementia was brought to hospital for increasing confusion and agitation and recent fall; found to have bifrontal subdural hematomas; NSG said no intervention. During dialysis today had an acute episode of fixed left gaze, hypotension, and altered mental status lasting about 10 minutes. Consulted for antiepileptic recs. BP back up. Exam shows elderly gentleman, nonverbal, unable to follow any commands, no eye contact, moving all extremities spontaneously. CTH report reviewed (unable to view images). IMPRESSION: possible seizure earlier, definitely at risk for seizures given SDH and history of dementia so would treat empirically with low-dose Depakote IV for palliation. Would highly recommend comfort care/hospice for this gentleman; less is more at this point of his life, and I do not agree with subjecting him to dialysis when he does not assent to it.  CT HEAD: Reviewed  Metrics: [employer] Notification Time: 06/02/2019 18:42:08 Stamp Time: 06/02/2019 18:45:18 Callback Response Time: 06/02/2019 18:46:18  Our recommendations are outlined below.  Recommendations:     .  Start Depakote IV 250mg  IV BID     .  Would recommend comfort care and hospice.    Disposition: Sign Off  Sign Out:     .  Discussed with Rapid Response Team  ----------------------------------------------------------------------------------------------------  Chief Complaint: Episode of hypotension, fixed left gaze, and   History of Present Illness: Patient is a 86 year old Male.  86 y.o. male, with medical history of ESRD on hemodialysis MWF, diabetes mellitus type 2, dementia was brought to hospital for increasing confusion  and agitation. Patient lives at local nursing facility generally alert and oriented to person only at baseline however has had increasing agitation and not being cooperative for hemodialysis. Last dialysis was on Monday. He missed hemodialysis on Wednesday and Friday. Patient fell at facility with bruising at right periorbital region. CT head was obtained which showed small left frontal subdural hematoma. Neurosurgery Dr. Roseanne Reno was consulted and he reviewed the films. Did not recommend surgical intervention or transfer to [facility]. CTH was repeated and NSG still did not want to do surgical intervention. He had first dialysis today in 5 days and had hypotensive and acute mental status change. No jerking. Had bilateral fixed gaze to left for 10 minutes then resolved within 10 minutes. Primary team concerned for seizure and wanted recs.    Past Medical History:     . Hypertension     . Diabetes Mellitus     . Hyperlipidemia    Antiplatelet use: asa 81 held     Examination: BP(166/82), Pulse(109), Blood Glucose(122)  Neuro Exam:  Sedation: Patient is not sedated and last time sedated was 1mg  Ativan given before dialysis.  Patient is breathing room air.  - Level of Consciousness: Obtunded and does not follow commands.  - Verbal: No response  - Eyes: Opens spontaneously. No gaze deviation.  Pupils are equal, round and fixed.  Grimaces to pain more on the right side.  - Motor: Obeys commands and/or moves purposefully.  GCS score: 11   Patient is being evaluated for possible acute neurologic impairment and high probability of imminent or life-threatening deterioration. I spent total of 30 minutes providing care to this patient, including time for face to face visit via telemedicine, review of medical records, imaging studies and discussion  of findings with providers, the patient and/or family.   Dr Ophelia Charter   [facility] [phone]  Case [id number]

## 2019-06-03 ENCOUNTER — Observation Stay (HOSPITAL_COMMUNITY): Payer: Medicare HMO

## 2019-06-03 DIAGNOSIS — E875 Hyperkalemia: Secondary | ICD-10-CM | POA: Diagnosis not present

## 2019-06-03 DIAGNOSIS — D631 Anemia in chronic kidney disease: Secondary | ICD-10-CM | POA: Diagnosis not present

## 2019-06-03 DIAGNOSIS — Z992 Dependence on renal dialysis: Secondary | ICD-10-CM | POA: Diagnosis not present

## 2019-06-03 DIAGNOSIS — N250 Renal osteodystrophy: Secondary | ICD-10-CM | POA: Diagnosis not present

## 2019-06-03 DIAGNOSIS — E1129 Type 2 diabetes mellitus with other diabetic kidney complication: Secondary | ICD-10-CM | POA: Diagnosis not present

## 2019-06-03 DIAGNOSIS — I120 Hypertensive chronic kidney disease with stage 5 chronic kidney disease or end stage renal disease: Secondary | ICD-10-CM | POA: Diagnosis not present

## 2019-06-03 DIAGNOSIS — S065X9A Traumatic subdural hemorrhage with loss of consciousness of unspecified duration, initial encounter: Secondary | ICD-10-CM | POA: Diagnosis not present

## 2019-06-03 DIAGNOSIS — N186 End stage renal disease: Secondary | ICD-10-CM | POA: Diagnosis not present

## 2019-06-03 DIAGNOSIS — Z515 Encounter for palliative care: Secondary | ICD-10-CM | POA: Diagnosis not present

## 2019-06-03 LAB — CBC WITH DIFFERENTIAL/PLATELET
Abs Immature Granulocytes: 0.03 10*3/uL (ref 0.00–0.07)
Basophils Absolute: 0.1 10*3/uL (ref 0.0–0.1)
Basophils Relative: 1 %
Eosinophils Absolute: 0.2 10*3/uL (ref 0.0–0.5)
Eosinophils Relative: 3 %
HCT: 33.2 % — ABNORMAL LOW (ref 39.0–52.0)
Hemoglobin: 10.5 g/dL — ABNORMAL LOW (ref 13.0–17.0)
Immature Granulocytes: 0 %
Lymphocytes Relative: 17 %
Lymphs Abs: 1.3 10*3/uL (ref 0.7–4.0)
MCH: 30.8 pg (ref 26.0–34.0)
MCHC: 31.6 g/dL (ref 30.0–36.0)
MCV: 97.4 fL (ref 80.0–100.0)
Monocytes Absolute: 0.9 10*3/uL (ref 0.1–1.0)
Monocytes Relative: 11 %
Neutro Abs: 5.2 10*3/uL (ref 1.7–7.7)
Neutrophils Relative %: 68 %
Platelets: 234 10*3/uL (ref 150–400)
RBC: 3.41 MIL/uL — ABNORMAL LOW (ref 4.22–5.81)
RDW: 16.5 % — ABNORMAL HIGH (ref 11.5–15.5)
WBC: 7.7 10*3/uL (ref 4.0–10.5)
nRBC: 0 % (ref 0.0–0.2)

## 2019-06-03 LAB — URINE CULTURE: Culture: <10000 — AB

## 2019-06-03 LAB — COMPREHENSIVE METABOLIC PANEL
ALT: 40 U/L (ref 0–44)
AST: 43 U/L — ABNORMAL HIGH (ref 15–41)
Albumin: 3.6 g/dL (ref 3.5–5.0)
Alkaline Phosphatase: 58 U/L (ref 38–126)
Anion gap: 15 (ref 5–15)
BUN: 86 mg/dL — ABNORMAL HIGH (ref 8–23)
CO2: 23 mmol/L (ref 22–32)
Calcium: 9.3 mg/dL (ref 8.9–10.3)
Chloride: 102 mmol/L (ref 98–111)
Creatinine, Ser: 12.07 mg/dL — ABNORMAL HIGH (ref 0.61–1.24)
GFR calc Af Amer: 4 mL/min — ABNORMAL LOW (ref >60)
GFR calc non Af Amer: 3 mL/min — ABNORMAL LOW (ref >60)
Glucose, Bld: 94 mg/dL (ref 70–99)
Potassium: 5.1 mmol/L (ref 3.5–5.1)
Sodium: 140 mmol/L (ref 135–145)
Total Bilirubin: 0.7 mg/dL (ref 0.3–1.2)
Total Protein: 7 g/dL (ref 6.5–8.1)

## 2019-06-03 LAB — MAGNESIUM: Magnesium: 2.1 mg/dL (ref 1.7–2.4)

## 2019-06-03 MED ORDER — HALOPERIDOL LACTATE 5 MG/ML IJ SOLN
5.0000 mg | Freq: Four times a day (QID) | INTRAMUSCULAR | Status: DC | PRN
  Administered 2019-06-03 – 2019-06-05 (×4): 5 mg via INTRAMUSCULAR
  Filled 2019-06-03 (×3): qty 1

## 2019-06-03 MED ORDER — CHLORHEXIDINE GLUCONATE CLOTH 2 % EX PADS
6.0000 | MEDICATED_PAD | Freq: Every day | CUTANEOUS | Status: DC
  Administered 2019-06-03: 6 via TOPICAL

## 2019-06-03 MED ORDER — LORAZEPAM 2 MG/ML IJ SOLN
0.5000 mg | Freq: Once | INTRAMUSCULAR | Status: AC
  Administered 2019-06-03: 0.5 mg via INTRAVENOUS
  Filled 2019-06-03: qty 1

## 2019-06-03 MED ORDER — FENTANYL CITRATE (PF) 100 MCG/2ML IJ SOLN
12.5000 ug | INTRAMUSCULAR | Status: DC | PRN
  Administered 2019-06-04 – 2019-06-05 (×5): 25 ug via INTRAVENOUS
  Filled 2019-06-03 (×5): qty 2

## 2019-06-03 NOTE — Progress Notes (Signed)
   06/02/19 1830  Assess: MEWS Score  Temp 98.3 F (36.8 C)  BP (!) 166/82  Pulse Rate (!) 109  Resp 18  Level of Consciousness Responds to Voice  SpO2 99 %  O2 Device Room Air  Patient Activity (if Appropriate) In bed  Assess: MEWS Score  MEWS Temp 0  MEWS Systolic 0  MEWS Pulse 1  MEWS RR 0  MEWS LOC 1  MEWS Score 2  MEWS Score Color Yellow  Assess: if the MEWS score is Yellow or Red  Were vital signs taken at a resting state? Yes  Focused Assessment Documented focused assessment  Early Detection of Sepsis Score *See Row Information* Low  MEWS guidelines implemented *See Row Information* No, vital signs rechecked  Treat  MEWS Interventions Other (Comment) (MD aware and is currently in room assessing patient)  Take Vital Signs  Increase Vital Sign Frequency  Yellow: Q 2hr X 2 then Q 4hr X 2, if remains yellow, continue Q 4hrs  Escalate  MEWS: Escalate Yellow: discuss with charge nurse/RN and consider discussing with provider and RRT  Notify: Charge Nurse/RN  Name of Charge Nurse/RN Notified Larene Beach   Date Charge Nurse/RN Notified 06/02/19  Time Charge Nurse/RN Notified 2725  Notify: Provider  Provider Name/Title Dr. Roderic Palau  Date Provider Notified 06/02/19  Time Provider Notified [phone]  Notification Type Call  Notification Reason Change in status  Response See new orders  Date of Provider Response 06/02/19  Time of Provider Response 1830  Notify: Rapid Response  Name of Rapid Response RN Notified Tim A/C  Date Rapid Response Notified 06/02/19  Time Rapid Response Notified 4034  Document  Patient Outcome Stabilized after interventions  Progress note created (see row info) Yes

## 2019-06-03 NOTE — Progress Notes (Addendum)
   06/02/19 1920  Assess: MEWS Score  Temp 98.3 F (36.8 C)  BP 136/68  Pulse Rate (!) 107  Resp 18  Level of Consciousness Responds to Voice  SpO2 100 %  O2 Device Room Air  Patient Activity (if Appropriate) In bed  Assess: MEWS Score  MEWS Temp 0  MEWS Systolic 0  MEWS Pulse 1  MEWS RR 0  MEWS LOC 1  MEWS Score 2  MEWS Score Color Yellow  Assess: if the MEWS score is Yellow or Red  Were vital signs taken at a resting state? Yes  Focused Assessment Documented focused assessment  Early Detection of Sepsis Score *See Row Information* Low  MEWS guidelines implemented *See Row Information* No, vital signs rechecked  Treat  MEWS Interventions Other (Comment) (MD aware of issue no further orders given)  Take Vital Signs  Increase Vital Sign Frequency  Yellow: Q 2hr X 2 then Q 4hr X 2, if remains yellow, continue Q 4hrs  Escalate  MEWS: Escalate Yellow: discuss with charge nurse/RN and consider discussing with provider and RRT  Notify: Charge Nurse/RN  Name of Charge Nurse/RN Notified Kristi  Date Charge Nurse/RN Notified 06/02/19  Time Charge Nurse/RN Notified 1920  Notify: Provider  Provider Name/Title Dr. Roderic Palau  Date Provider Notified 06/02/19  Time Provider Notified 2000  Notification Type Call  Notification Reason Other (Comment) (Inform what Neorology consult said)  Response No new orders  Date of Provider Response 06/02/19  Time of Provider Response 2000  Document  Patient Outcome Stabilized after interventions  Progress note created (see row info) Yes

## 2019-06-03 NOTE — Care Management Obs Status (Signed)
[place] NOTIFICATION   Patient Details  Name: TOWNSEND CUDWORTH MRN: [id number] Date of Birth: 1930/09/21   Medicare Observation Status Notification Given:  Yes    Sherie Don, LCSW 06/03/2019, 10:30 AM

## 2019-06-03 NOTE — Progress Notes (Signed)
06/03/2019  I had a 4 way phone call family meeting with patient's sons, daughter, daughter in law and we discussed patient's prognosis and current condition and continuing to refuse to take meds and not allowing dialysis treatments and significant clinical decline.  They said that patient would not want further escalation of care and because he continues to be resistant to receiving further dialysis treatments they decided not to do further dialysis treatments and to change the focus of his care to comfort and dignity care. They said that they would like for him to return to [facility] with hospice care.  I will reach out to them on Tuesday after I have been able to speak with the [facility] team.  Code status DNR/DNI. Full Comfort Care.    Murvin Natal MD  How to contact the [facility] Attending or Consulting provider [place] or covering provider during after hours [place], for this patient?  1. Check the care team in [facility] and look for a) attending/consulting TRH provider listed and b) the [facility] team listed 2. Log into [email or web address] and use [place]'s universal password to access. If you do not have the password, please contact the hospital operator. 3. Locate the [facility] provider you are looking for under [facility] and page to a number that you can be directly reached. 4. If you still have difficulty reaching the provider, please page the [facility] (Director on Call) for the Hospitalists listed on amion for assistance.

## 2019-06-03 NOTE — Progress Notes (Signed)
PROGRESS NOTE   RED MANDT  GLO:[id number] DOB: [birth year] DOA: 06/01/2019 PCP: [name], MD   Chief Complaint  Patient presents with  . Altered Mental Status    Brief Admission History:   86 y.o. male, with medical history of ESRD on hemodialysis MWF, diabetes mellitus type 2, dementia was brought to hospital for increasing confusion and agitation.  Patient lives at local nursing facility generally alert and oriented to person only at baseline however has had increasing agitation and not being cooperative for hemodialysis.  Last dialysis was on Monday.  He missed hemodialysis on Wednesday and Friday.  Patient fell at facility with bruising at right periorbital region.  CT head was obtained which showed small left frontal subdural hematoma.  Neurosurgery Dr. Roseanne Reno was consulted and he reviewed the films.  Did not recommend surgical intervention or transfer to [facility].  He recommended observation and repeat CT head in a.m.  Assessment & Plan:   Active Problems:   SDH (subdural hematoma) (HCC)   Hyperkalemia   Subdural hematoma (HCC)  1. Subdural hematoma - repeat CT shows no change, appreciate neurosurgery assistance, no surgical intervention required and no repeat CT recommended.   2. Seizure - likely secondary to hematoma - Pt has been seen by teleneurology and started on palliative dapakote.  3. AMS - likely secondary to seizure and metabolic derangement. Not much improvement.  4. ESRD on HD - had difficult HD treatment yesterday and had seizure near end of treatment.   5. HTN - stable  6. Goals of care : I had family meeting with sons daughter, daughter in law and we discussed patient's wishes and discussed his prognosis and his refusal of care and they have come to an agreement that we stop dialysis treatments now and focus on full comfort care.  They would prefer him to return to [facility] with hospice benefits if possible.  I will reach out to [facility] team and  ask them for assistance.    DVT prophylaxis:  Code Status:  Family Communication: family meeting today  Disposition:   Status is: Observation  The patient remains OBS appropriate and will d/c before 2 midnights.  Dispo: The patient is from: SNF              Anticipated d/c is to: SNF with hospice               Anticipated d/c date is: 2 days              Patient currently is not medically stable to d/c.   Consultants:   Nephrology   Procedures:   hemodialysis  Antimicrobials:     Subjective: Pt agitated this morning, not speaking to me.  Objective: Vitals:   06/02/19 2120 06/03/19 0125 06/03/19 0635 06/03/19 1419  BP: (!) 163/85 (!) 156/72 (!) 161/82 (!) 156/72  Pulse: (!) 115 100 (!) 57 (!) 105  Resp: 18 18 16    Temp: 99.1 F (37.3 C) 99 F (37.2 C) 98.3 F (36.8 C) 98.5 F (36.9 C)  TempSrc: Oral  Oral Oral  SpO2: 100% 99% 100%   Weight:      Height:        Intake/Output Summary (Last 24 hours) at 06/03/2019 1727 Last data filed at 06/03/2019 0400 Gross per 24 hour  Intake 55.5 ml  Output 1901 ml  Net -1845.5 ml   Filed Weights   06/01/19 1619 06/02/19 1520  Weight: 72 kg 72.5 kg  Examination:  General exam: elderly male, agitated, chronically ill appearing, pulling off gown. Does not want to be examined.   Respiratory system: shallow breathing.  Cardiovascular system: normal S1 & S2 heard. Gastrointestinal system: Abdomen is nondistended, soft and nontender. No organomegaly or masses felt. Normal bowel sounds heard. Central nervous system: moving all extremities.  Extremities: no gross lesions.  Skin: No rashes, lesions or ulcers Psychiatry: agitated.   Data Reviewed: I have personally reviewed following labs and imaging studies  CBC: Recent Labs  Lab 05/30/19 1802 06/01/19 1814 06/02/19 0715 06/03/19 0735  WBC  --  8.0 6.3 7.7  NEUTROABS  --  5.4  --  5.2  HGB 9.9* 9.7* 9.7* 10.5*  HCT 29.0* 30.8* 31.6* 33.2*  MCV  --  99.7 98.4  97.4  PLT  --  186 189 481    Basic Metabolic Panel: Recent Labs  Lab 05/30/19 1802 06/01/19 1814 06/02/19 0715 06/03/19 0735  NA 140 144 143 140  K 5.2* 5.2* 5.5* 5.1  CL 112* 107 109 102  CO2  --  22 17* 23  GLUCOSE 120* 102* 82 94  BUN 113* 136* 146* 86*  CREATININE 17.80* 16.70* 17.06* 12.07*  CALCIUM  --  8.7* 9.5 9.3  MG  --   --   --  2.1    GFR: Estimated Creatinine Clearance: 3.8 mL/min (A) (by C-G formula based on SCr of 12.07 mg/dL (H)).  Liver Function Tests: Recent Labs  Lab 06/01/19 1814 06/02/19 0715 06/03/19 0735  AST 27 26 43*  ALT 37 37 40  ALKPHOS 63 55 58  BILITOT 0.5 0.6 0.7  PROT 6.6 6.5 7.0  ALBUMIN 3.4* 3.4* 3.6    CBG: Recent Labs  Lab 06/02/19 1819  GLUCAP 122*    Recent Results (from the past 240 hour(s))  SARS Coronavirus 2 by RT PCR (hospital order, performed in [facility] hospital lab) Nasopharyngeal Nasopharyngeal Swab     Status: None   Collection Time: 06/01/19  9:05 PM   Specimen: Nasopharyngeal Swab  Result Value Ref Range Status   SARS Coronavirus 2 NEGATIVE NEGATIVE Final    Comment: (NOTE) SARS-CoV-2 target nucleic acids are NOT DETECTED. The SARS-CoV-2 RNA is generally detectable in upper and lower respiratory specimens during the acute phase of infection. The lowest concentration of SARS-CoV-2 viral copies this assay can detect is 250 copies / mL. A negative result does not preclude SARS-CoV-2 infection and should not be used as the sole basis for treatment or other patient management decisions.  A negative result may occur with improper specimen collection / handling, submission of specimen other than nasopharyngeal swab, presence of viral mutation(s) within the areas targeted by this assay, and inadequate number of viral copies (<250 copies / mL). A negative result must be combined with clinical observations, patient history, and epidemiological information. Fact Sheet for Patients:    StrictlyIdeas.no Fact Sheet for Healthcare Providers: BankingDealers.co.za This test is not yet approved or cleared  by the [place] FDA and has been authorized for detection and/or diagnosis of SARS-CoV-2 by FDA under an Emergency Use Authorization (EUA).  This EUA will remain in effect (meaning this test can be used) for the duration of the COVID-19 declaration under Section 564(b)(1) of the Act, 21 U.S.C. section 360bbb-3(b)(1), unless the authorization is terminated or revoked sooner. Performed at [facility], [address], [place], [place] [address]   Urine Culture     Status: Abnormal   Collection Time: 06/01/19  9:05  PM   Specimen: Urine, Catheterized  Result Value Ref Range Status   Specimen Description   Final    URINE, CATHETERIZED Performed at [facility], [address] [place], [place] [address]    Special Requests   Final    NONE Performed at [place] [place], [place] [address]    Culture (A)  Final    <10,000 COLONIES/mL INSIGNIFICANT GROWTH Performed at [facility] [address] [place], [place] [address]    Report Status 06/03/2019 FINAL  Final  MRSA PCR Screening     Status: None   Collection Time: 06/02/19  3:18 AM   Specimen: Nasopharyngeal  Result Value Ref Range Status   MRSA by PCR NEGATIVE NEGATIVE Final    Comment:        The GeneXpert MRSA Assay (FDA approved for NASAL specimens only), is one component of a comprehensive MRSA colonization surveillance program. It is not intended to diagnose MRSA infection nor to guide or monitor treatment for MRSA infections. Performed at [facility], [address] [place],  [address]      Radiology Studies: CT HEAD WO CONTRAST  Result Date: 06/02/2019 CLINICAL DATA:  Subdural hematoma, follow-up EXAM: CT HEAD WITHOUT CONTRAST TECHNIQUE: Contiguous axial images were obtained from the base of the skull through the  vertex without intravenous contrast. COMPARISON:  06/01/2019 FINDINGS: Brain: Extra-axial spaces are prominent as noted on previous imaging, but there are also superimposed subdural hygromas present. Left cerebral convexity subdural hygroma with superimposed small volume acute blood products again identified. There also stable trace acute blood products within a right subdural hygroma. Hygromas measure up to 14 mm in thickness on the right and 7 mm on the left. This is new/increased since 2019. Stable minor leftward midline shift. There is no new loss of gray-white differentiation. Patchy hypoattenuation in the supratentorial white matter is nonspecific but probably reflects moderate chronic microvascular ischemic changes. Vascular: There is atherosclerotic calcification at the skull base. Skull: Calvarium is unremarkable. Sinuses/Orbits: Minor mucosal thickening.  Orbits are unremarkable. Other: None. IMPRESSION: Bilateral cerebral convexity subdural hygromas, right larger than left, with small volume acute blood products. No new hemorrhage. Stable mild mass effect. Electronically Signed   By: Macy Mis [name]   On: 06/02/2019 10:45   CT Head Wo Contrast  Result Date: 06/01/2019 CLINICAL DATA:  Altered mental status. Head trauma, minor (Age >= 65y) EXAM: CT HEAD WITHOUT CONTRAST TECHNIQUE: Contiguous axial images were obtained from the base of the skull through the vertex without intravenous contrast. COMPARISON:  Head CT 04/24/2019, brain MRI 12/13/2017 FINDINGS: Brain: Acute left frontal subdural hematoma measures up to 6 mm in thickness. Possible tiny acute right frontal subdural hematoma, series 2, image 23. No significant mass effect. There is no midline shift. Advanced atrophy and prominence of the extra-axial CSF spaces, stable from prior exam. No subarachnoid or intraparenchymal blood. No hydrocephalus. Stable degree of generalized atrophy. Basilar cisterns are patent. Vascular: Atherosclerosis of  skullbase vasculature without hyperdense vessel or abnormal calcification. Skull: No fracture or focal lesion. Sinuses/Orbits: Trace mucosal thickening in left maxillary sinus. Paranasal sinuses otherwise clear. The mastoid air cells are clear. Other: None. IMPRESSION: 1. Acute small left frontal subdural hematoma measures up to 6 mm in thickness. No significant mass effect. No midline shift. 2. Possible tiny acute right frontal subdural hematoma. 3. Advanced atrophy and prominence of the extra-axial CSF spaces, stable from prior exam. These results were called by telephone at the  time of interpretation on 06/01/2019 at 8:16 pm to Dr Tomi Bamberger , who verbally acknowledged these results. Electronically Signed   By: Keith Rake [name]   On: 06/01/2019 20:16     Scheduled Meds: . [START ON 06/04/2019] allopurinol  100 mg Oral Q M,W,F  . atorvastatin  40 mg Oral QHS  . Chlorhexidine Gluconate Cloth  6 each Topical Q0600  . Chlorhexidine Gluconate Cloth  6 each Topical Q0600  . finasteride  5 mg Oral Daily  . flumazenil  0.2 mg Intravenous Once  . OLANZapine  5 mg Oral q AM  . sertraline  25 mg Oral QHS  . sevelamer carbonate  800 mg Oral TID WC  . sodium chloride flush  3 mL Intravenous Q12H   Continuous Infusions: . sodium chloride 250 mL (06/03/19 1201)  . sodium chloride    . sodium chloride    . valproate sodium 250 mg (06/03/19 1204)     LOS: 0 days   Time spent: 36 mins   Tatumn Corbridge Wynetta Emery, MD How to contact the [facility] Attending or Consulting provider [place] or covering provider during after hours [place], for this patient?  1. Check the care team in [facility] and look for a) attending/consulting TR[facility] provider listed and b) the [facility] team listed 2. Log into [email or web address] and use [place]'s universal password to access. If you do not have the password, please contact the hospital operator. 3. Locate the [facility] provider you are looking for under [facility] and page to a number that you can be  directly reached. 4. If you still have difficulty reaching the provider, please page the [facility] (Director on Call) for the Hospitalists listed on amion for assistance.  06/03/2019, 5:27 PM

## 2019-06-03 NOTE — Progress Notes (Signed)
[facility] ROUNDING NOTE   Subjective:   This is an 86 year old gentleman with a history of dementia diabetes mellitus type 2 end-stage renal disease dialysis Monday Wednesday Friday.  Was brought to the emergency room from a local nursing facility for increasing confusion and agitation.  His last dialysis was Monday, 05/28/2019.  Patient fell at the facility with bruising of the right periorbital region.  A CT head was obtained and showed a small left frontal subdural hematoma.  Neurosurgery was consulted and did not recommend surgical intervention and transferred to [facility].  Patient has been evaluated by neurology.  Appreciate assistance of Dr. Alanson Aly.  She feels that hospice should be involved and dialysis discontinued.  Blood pressure 161/82 pulse 57 temperature 98.2 O2 sats 100% room air.  Dialysis 06/02/2019 with removal of 1.9 L  Sodium 140 potassium 5.1 chloride 102 CO2 23 BUN 86 creatinine 12 calcium 9.3 magnesium 2.1 albumin 3.6 hemoglobin 10.5  Atorvastatin 40 mg daily Proscar 5 mg daily  Objective:  Vital signs in last 24 hours:  Temp:  [98.3 F (36.8 C)-99.1 F (37.3 C)] 98.3 F (36.8 C) (05/30 0635) Pulse Rate:  [57-115] 57 (05/30 0635) Resp:  [16-18] 16 (05/30 0635) BP: (89-172)/(59-93) 161/82 (05/30 0635) SpO2:  [99 %-100 %] 100 % (05/30 0635) Weight:  [72.5 kg] 72.5 kg (05/29 1520)  Weight change: 0.5 kg Filed Weights   06/01/19 1619 06/02/19 1520  Weight: 72 kg 72.5 kg    Intake/Output: I/O last 3 completed shifts: In: 535.5 [P.O.:480; I.V.:3; IV Piggyback:52.5] Out: 1901 [Other:1901]   Intake/Output this shift:  No intake/output data recorded.  CVS- RRR 3/6 systolic murmur RS-  right IJ catheter.  Diminished air entry ABD- BS present soft non-distended EXT- no edema   Basic Metabolic Panel: Recent Labs  Lab 05/30/19 1802 06/01/19 1814 06/02/19 0715 06/03/19 0735  NA 140 144 143 140  K 5.2* 5.2* 5.5* 5.1  CL 112* 107  109 102  CO2  --  22 17* 23  GLUCOSE 120* 102* 82 94  BUN 113* 136* 146* 86*  CREATININE 17.80* 16.70* 17.06* 12.07*  CALCIUM  --  8.7* 9.5 9.3  MG  --   --   --  2.1    Liver Function Tests: Recent Labs  Lab 06/01/19 1814 06/02/19 0715 06/03/19 0735  AST 27 26 43*  ALT 37 37 40  ALKPHOS 63 55 58  BILITOT 0.5 0.6 0.7  PROT 6.6 6.5 7.0  ALBUMIN 3.4* 3.4* 3.6   No results for input(s): LIPASE, AMYLASE in the last 168 hours. No results for input(s): AMMONIA in the last 168 hours.  CBC: Recent Labs  Lab 05/30/19 1802 06/01/19 1814 06/02/19 0715 06/03/19 0735  WBC  --  8.0 6.3 7.7  NEUTROABS  --  5.4  --  5.2  HGB 9.9* 9.7* 9.7* 10.5*  HCT 29.0* 30.8* 31.6* 33.2*  MCV  --  99.7 98.4 97.4  PLT  --  186 189 234    Cardiac Enzymes: No results for input(s): CKTOTAL, CKMB, CKMBINDEX, TROPONINI in the last 168 hours.  BNP: Invalid input(s): POCBNP  CBG: Recent Labs  Lab 06/02/19 1819  GLUCAP 122*    Microbiology: Results for orders placed or performed during the hospital encounter of 06/01/19  SARS Coronavirus 2 by RT PCR (hospital order, performed in [facility] hospital lab) Nasopharyngeal Nasopharyngeal Swab     Status: None   Collection Time: 06/01/19  9:05 PM   Specimen: Nasopharyngeal  Swab  Result Value Ref Range Status   SARS Coronavirus 2 NEGATIVE NEGATIVE Final    Comment: (NOTE) SARS-CoV-2 target nucleic acids are NOT DETECTED. The SARS-CoV-2 RNA is generally detectable in upper and lower respiratory specimens during the acute phase of infection. The lowest concentration of SARS-CoV-2 viral copies this assay can detect is 250 copies / mL. A negative result does not preclude SARS-CoV-2 infection and should not be used as the sole basis for treatment or other patient management decisions.  A negative result may occur with improper specimen collection / handling, submission of specimen other than nasopharyngeal swab, presence of viral mutation(s)  within the areas targeted by this assay, and inadequate number of viral copies (<250 copies / mL). A negative result must be combined with clinical observations, patient history, and epidemiological information. Fact Sheet for Patients:   StrictlyIdeas.no Fact Sheet for Healthcare Providers: BankingDealers.co.za This test is not yet approved or cleared  by the [place] FDA and has been authorized for detection and/or diagnosis of SARS-CoV-2 by FDA under an Emergency Use Authorization (EUA).  This EUA will remain in effect (meaning this test can be used) for the duration of the COVID-19 declaration under Section 564(b)(1) of the Act, 21 U.S.C. section 360bbb-3(b)(1), unless the authorization is terminated or revoked sooner. Performed at [facility], [address] [place], [place] [address]   MRSA PCR Screening     Status: None   Collection Time: 06/02/19  3:18 AM   Specimen: Nasopharyngeal  Result Value Ref Range Status   MRSA by PCR NEGATIVE NEGATIVE Final    Comment:        The GeneXpert MRSA Assay (FDA approved for NASAL specimens only), is one component of a comprehensive MRSA colonization surveillance program. It is not intended to diagnose MRSA infection nor to guide or monitor treatment for MRSA infections. Performed at [facility], [address], [place], [place] [address]     Coagulation Studies: No results for input(s): LABPROT, INR in the last 72 hours.  Urinalysis: Recent Labs    06/01/19 1834  COLORURINE STRAW*  LABSPEC 1.011  PHURINE 7.0  GLUCOSEU 50*  HGBUR LARGE*  BILIRUBINUR NEGATIVE  KETONESUR NEGATIVE  PROTEINUR 100*  NITRITE NEGATIVE  LEUKOCYTESUR NEGATIVE      Imaging: CT HEAD WO CONTRAST  Result Date: 06/02/2019 CLINICAL DATA:  Subdural hematoma, follow-up EXAM: CT HEAD WITHOUT CONTRAST TECHNIQUE: Contiguous axial images were obtained from the base of the skull through the vertex  without intravenous contrast. COMPARISON:  06/01/2019 FINDINGS: Brain: Extra-axial spaces are prominent as noted on previous imaging, but there are also superimposed subdural hygromas present. Left cerebral convexity subdural hygroma with superimposed small volume acute blood products again identified. There also stable trace acute blood products within a right subdural hygroma. Hygromas measure up to 14 mm in thickness on the right and 7 mm on the left. This is new/increased since 2019. Stable minor leftward midline shift. There is no new loss of gray-white differentiation. Patchy hypoattenuation in the supratentorial white matter is nonspecific but probably reflects moderate chronic microvascular ischemic changes. Vascular: There is atherosclerotic calcification at the skull base. Skull: Calvarium is unremarkable. Sinuses/Orbits: Minor mucosal thickening.  Orbits are unremarkable. Other: None. IMPRESSION: Bilateral cerebral convexity subdural hygromas, right larger than left, with small volume acute blood products. No new hemorrhage. Stable mild mass effect. Electronically Signed   By: Macy Mis [name]   On: 06/02/2019 10:45   CT Head Wo Contrast  Result Date: 06/01/2019  CLINICAL DATA:  Altered mental status. Head trauma, minor (Age >= 65y) EXAM: CT HEAD WITHOUT CONTRAST TECHNIQUE: Contiguous axial images were obtained from the base of the skull through the vertex without intravenous contrast. COMPARISON:  Head CT 04/24/2019, brain MRI 12/13/2017 FINDINGS: Brain: Acute left frontal subdural hematoma measures up to 6 mm in thickness. Possible tiny acute right frontal subdural hematoma, series 2, image 23. No significant mass effect. There is no midline shift. Advanced atrophy and prominence of the extra-axial CSF spaces, stable from prior exam. No subarachnoid or intraparenchymal blood. No hydrocephalus. Stable degree of generalized atrophy. Basilar cisterns are patent. Vascular: Atherosclerosis of skullbase  vasculature without hyperdense vessel or abnormal calcification. Skull: No fracture or focal lesion. Sinuses/Orbits: Trace mucosal thickening in left maxillary sinus. Paranasal sinuses otherwise clear. The mastoid air cells are clear. Other: None. IMPRESSION: 1. Acute small left frontal subdural hematoma measures up to 6 mm in thickness. No significant mass effect. No midline shift. 2. Possible tiny acute right frontal subdural hematoma. 3. Advanced atrophy and prominence of the extra-axial CSF spaces, stable from prior exam. These results were called by telephone at the time of interpretation on 06/01/2019 at 8:16 pm to Dr Tomi Bamberger , who verbally acknowledged these results. Electronically Signed   By: Keith Rake [name]   On: 06/01/2019 20:16     Medications:   . sodium chloride    . sodium chloride    . sodium chloride    . valproate sodium 250 mg (06/02/19 2258)   . [START ON 06/04/2019] allopurinol  100 mg Oral Q M,W,F  . atorvastatin  40 mg Oral QHS  . Chlorhexidine Gluconate Cloth  6 each Topical Q0600  . finasteride  5 mg Oral Daily  . flumazenil  0.2 mg Intravenous Once  . LORazepam  2 mg Intravenous Once  . OLANZapine  5 mg Oral q AM  . sertraline  25 mg Oral QHS  . sevelamer carbonate  800 mg Oral TID WC  . sodium chloride flush  3 mL Intravenous Q12H   sodium chloride, sodium chloride, sodium chloride, acetaminophen **OR** acetaminophen, acetaminophen, cloNIDine, haloperidol lactate, lidocaine (PF), lidocaine-prilocaine, LORazepam, ondansetron **OR** ondansetron (ZOFRAN) IV, pentafluoroprop-tetrafluoroeth, sodium chloride flush  Assessment/ Plan:  Dialysis [facility] 3 times a week blood flow 400 dialysis flow rate 800 duration 4 hours potassium 2 calcium 2.25 estimated dry weight 71  Patient has missed the 5 treatments last 30 days  His average weight gains appear to be between 1 and 2 L.  Medications micera 50 mcg every 2 weeks, Venofer 100 mg q. treatment,  calcitriol 2 mcg 3 times weekly with dialysis  1.  End-stage renal disease Monday Wednesday Friday [facility] patient appears to have frequently missed dialysis over the past month and comes in with relatively high BUN.  He has a history of dementia but has had some worsening in his mental status.  It may be prudent to involve palliative medicine at this point.  We will also attempt to dialyze him slowly to avoid disequilibrium.  He would be preferable to dialyze him daily at this point to see if we can obtain any improvement in mental status.  We will plan dialysis 06/04/2019.  Recommend palliative medicine involvement and family discussions after the holiday weekend 2.  Hypertension appears well-controlled does not appear to be an issue at this point.   continued in the hospital. 3.  Anemia we will continue to follow.  Patient was receiving IV iron  as well as ESA. 4.  Metabolic bone disease continue Renvela if patient is taking oral. 5.  Diabetes mellitus as per primary service 6.  Status post fall with subdural hematoma neurosurgery consulted no surgical intervention 7.  Dementia would recommend the patient is evaluated by palliative medicine.    LOS: 0 Sherril Croon @TODAY @9 :00 AM

## 2019-06-04 DIAGNOSIS — F0390 Unspecified dementia without behavioral disturbance: Secondary | ICD-10-CM | POA: Diagnosis present

## 2019-06-04 DIAGNOSIS — Z20822 Contact with and (suspected) exposure to covid-19: Secondary | ICD-10-CM | POA: Diagnosis not present

## 2019-06-04 DIAGNOSIS — E8889 Other specified metabolic disorders: Secondary | ICD-10-CM | POA: Diagnosis present

## 2019-06-04 DIAGNOSIS — M109 Gout, unspecified: Secondary | ICD-10-CM | POA: Diagnosis present

## 2019-06-04 DIAGNOSIS — Z992 Dependence on renal dialysis: Secondary | ICD-10-CM | POA: Diagnosis not present

## 2019-06-04 DIAGNOSIS — R569 Unspecified convulsions: Secondary | ICD-10-CM | POA: Diagnosis not present

## 2019-06-04 DIAGNOSIS — W1830XA Fall on same level, unspecified, initial encounter: Secondary | ICD-10-CM | POA: Diagnosis not present

## 2019-06-04 DIAGNOSIS — R404 Transient alteration of awareness: Secondary | ICD-10-CM | POA: Diagnosis not present

## 2019-06-04 DIAGNOSIS — Z7401 Bed confinement status: Secondary | ICD-10-CM | POA: Diagnosis not present

## 2019-06-04 DIAGNOSIS — Z515 Encounter for palliative care: Secondary | ICD-10-CM | POA: Diagnosis not present

## 2019-06-04 DIAGNOSIS — S065X9A Traumatic subdural hemorrhage with loss of consciousness of unspecified duration, initial encounter: Secondary | ICD-10-CM | POA: Diagnosis not present

## 2019-06-04 DIAGNOSIS — N186 End stage renal disease: Secondary | ICD-10-CM | POA: Diagnosis not present

## 2019-06-04 DIAGNOSIS — K219 Gastro-esophageal reflux disease without esophagitis: Secondary | ICD-10-CM | POA: Diagnosis present

## 2019-06-04 DIAGNOSIS — D631 Anemia in chronic kidney disease: Secondary | ICD-10-CM | POA: Diagnosis present

## 2019-06-04 DIAGNOSIS — H9190 Unspecified hearing loss, unspecified ear: Secondary | ICD-10-CM | POA: Diagnosis present

## 2019-06-04 DIAGNOSIS — G8929 Other chronic pain: Secondary | ICD-10-CM | POA: Diagnosis present

## 2019-06-04 DIAGNOSIS — Z87891 Personal history of nicotine dependence: Secondary | ICD-10-CM | POA: Diagnosis not present

## 2019-06-04 DIAGNOSIS — I120 Hypertensive chronic kidney disease with stage 5 chronic kidney disease or end stage renal disease: Secondary | ICD-10-CM | POA: Diagnosis not present

## 2019-06-04 DIAGNOSIS — R471 Dysarthria and anarthria: Secondary | ICD-10-CM | POA: Diagnosis present

## 2019-06-04 DIAGNOSIS — E785 Hyperlipidemia, unspecified: Secondary | ICD-10-CM | POA: Diagnosis present

## 2019-06-04 DIAGNOSIS — G9349 Other encephalopathy: Secondary | ICD-10-CM | POA: Diagnosis not present

## 2019-06-04 DIAGNOSIS — N400 Enlarged prostate without lower urinary tract symptoms: Secondary | ICD-10-CM | POA: Diagnosis present

## 2019-06-04 DIAGNOSIS — E1122 Type 2 diabetes mellitus with diabetic chronic kidney disease: Secondary | ICD-10-CM | POA: Diagnosis not present

## 2019-06-04 DIAGNOSIS — E875 Hyperkalemia: Secondary | ICD-10-CM | POA: Diagnosis present

## 2019-06-04 DIAGNOSIS — I129 Hypertensive chronic kidney disease with stage 1 through stage 4 chronic kidney disease, or unspecified chronic kidney disease: Secondary | ICD-10-CM | POA: Diagnosis not present

## 2019-06-04 DIAGNOSIS — S0011XA Contusion of right eyelid and periocular area, initial encounter: Secondary | ICD-10-CM | POA: Diagnosis not present

## 2019-06-04 DIAGNOSIS — R451 Restlessness and agitation: Secondary | ICD-10-CM | POA: Diagnosis present

## 2019-06-04 DIAGNOSIS — Z66 Do not resuscitate: Secondary | ICD-10-CM | POA: Diagnosis not present

## 2019-06-04 DIAGNOSIS — I959 Hypotension, unspecified: Secondary | ICD-10-CM | POA: Diagnosis present

## 2019-06-04 MED ORDER — DIVALPROEX SODIUM 250 MG PO DR TAB
250.0000 mg | DELAYED_RELEASE_TABLET | Freq: Two times a day (BID) | ORAL | Status: DC
  Filled 2019-06-04: qty 1

## 2019-06-04 MED ORDER — LORAZEPAM 1 MG PO TABS
1.0000 mg | ORAL_TABLET | Freq: Four times a day (QID) | ORAL | Status: DC | PRN
  Filled 2019-06-04: qty 1

## 2019-06-04 NOTE — NC FL2 (Signed)
[place] LEVEL OF CARE SCREENING TOOL     IDENTIFICATION  Patient Name: Miguel Tucker Birthdate: 22-Mar-1930 Sex: male Admission Date (Current Location): 06/01/2019  [place] and [place] Number:  [facility] [id number] [place] and Address:  [place] [address] [place]      Provider Number: [id number]  Attending Physician Name and Address:  Murlean Iba, MD  Relative Name and Phone Number:  [phone], Son Skyeler, [place].    Current Level of Care: Other (Comment)(observation) Recommended Level of Care: [place] Prior Approval Number:    Date Approved/Denied:   PASRR Number:    Discharge Plan: SNF    Current Diagnoses: Patient Active Problem List   Diagnosis Date Noted  . Hospice care 06/04/2019  . SDH (subdural hematoma) ([place]) 06/02/2019  . Hyperkalemia   . Subdural hematoma ([place])   . Gait instability 01/12/2018  . Acute ischemic stroke ([place]) 12/13/2017  . Weakness of left upper extremity 12/12/2017  . Hypokalemia 12/12/2017  . ESRD (end stage renal disease) ([place]) 12/12/2017  . HLD (hyperlipidemia) 12/12/2017  . Gout 12/12/2017  . BPH (benign prostatic hyperplasia) 12/12/2017  . Asthma 12/12/2017  . GERD (gastroesophageal reflux disease) 12/12/2017  . Symptomatic anemia 11/25/2017  . Generalized weakness 09/06/2017  . HTN (hypertension) 09/06/2017  . Anemia, chronic disease 09/06/2017  . Dementia ([place]) 09/06/2017  . Onychomycosis 04/11/2017  . Hammer toe 04/11/2017  . Diabetes mellitus ([place]) 04/11/2017  . Osteoarthritis of knee 02/11/2017  . Difficulty in walking(719.7) 12/09/2011  . Radicular pain of left lower extremity 12/09/2011  . Wound healing, delayed 11/06/2010    Orientation RESPIRATION BLADDER Height & Weight        Normal Incontinent Weight: 159 lb 13.3 oz (72.5 kg) Height:  5\' 5"  (165.1 cm)  BEHAVIORAL SYMPTOMS/MOOD NEUROLOGICAL BOWEL NUTRITION STATUS   Incontinent Diet  AMBULATORY STATUS COMMUNICATION OF NEEDS Skin   Extensive Assist Verbally Normal                       Personal Care Assistance Level of Assistance  Total care       Total Care Assistance: Maximum assistance   Functional Limitations Info  Sight, Speech, Hearing Sight Info: Adequate Hearing Info: Adequate      SPECIAL CARE FACTORS FREQUENCY                       Contractures Contractures Info: Not present    Additional Factors Info  Code Status, Allergies, Psychotropic Code Status Info: DNR Allergies Info: Penicillins Psychotropic Info: Ativan, Zyprexa, Zoloft         Current Medications (06/04/2019):  This is the current hospital active medication list Current Facility-Administered Medications  Medication Dose Route Frequency Provider Last Rate Last Admin  . 0.9 %  sodium chloride infusion  100 mL Intravenous PRN Edrick Oh, MD      . 0.9 %  sodium chloride infusion  100 mL Intravenous PRN Edrick Oh, MD      . acetaminophen (TYLENOL) tablet 650 mg  650 mg Oral Q6H PRN Oswald Hillock, MD       Or  . acetaminophen (TYLENOL) suppository 650 mg  650 mg Rectal Q6H PRN Oswald Hillock, MD      . acetaminophen (TYLENOL) tablet 500 mg  500 mg Oral Q6H PRN Oswald Hillock, MD      . atorvastatin (LIPITOR) tablet 40 mg  40 mg  Oral QHS Oswald Hillock, MD   40 mg at 06/03/19 2138  . Chlorhexidine Gluconate Cloth 2 % PADS 6 each  6 each Topical Q0600 Edrick Oh, MD   6 each at 06/03/19 0600  . Chlorhexidine Gluconate Cloth 2 % PADS 6 each  6 each Topical Q0600 Edrick Oh, MD   6 each at 06/03/19 0900  . cloNIDine (CATAPRES) tablet 0.1 mg  0.1 mg Oral Q8H PRN Oswald Hillock, MD      . divalproex (DEPAKOTE) DR tablet 250 mg  250 mg Oral Q12H Johnson, Clanford L, MD      . fentaNYL (SUBLIMAZE) injection 12.5-25 mcg  12.5-25 mcg Intravenous Q2H PRN Johnson, Clanford L, MD   25 mcg at 06/04/19 1021  . finasteride (PROSCAR) tablet 5 mg  5 mg Oral Daily  Oswald Hillock, MD   5 mg at 06/04/19 0841  . haloperidol lactate (HALDOL) injection 5 mg  5 mg Intramuscular Q6H PRN Lovey Newcomer T, NP   5 mg at 06/04/19 0344  . lidocaine (PF) (XYLOCAINE) 1 % injection 5 mL  5 mL Intradermal PRN Edrick Oh, MD      . lidocaine-prilocaine (EMLA) cream 1 application  1 application Topical PRN Edrick Oh, MD      . LORazepam (ATIVAN) 2 MG/ML concentrated solution 1 mg  1 mg Oral Q6H PRN Johnson, Clanford L, MD      . OLANZapine (ZYPREXA) tablet 5 mg  5 mg Oral q AM Oswald Hillock, MD   5 mg at 06/04/19 0841  . ondansetron (ZOFRAN) tablet 4 mg  4 mg Oral Q6H PRN Oswald Hillock, MD       Or  . ondansetron (ZOFRAN) injection 4 mg  4 mg Intravenous Q6H PRN Oswald Hillock, MD      . pentafluoroprop-tetrafluoroeth Landry Dyke) aerosol 1 application  1 application Topical PRN Edrick Oh, MD      . sertraline (ZOLOFT) tablet 25 mg  25 mg Oral QHS Oswald Hillock, MD   25 mg at 06/03/19 2138     Discharge Medications: Please see discharge summary for a list of discharge medications.  Relevant Imaging Results:  Relevant Lab Results:   Additional Information    Gwenneth Whiteman, Clydene Pugh, LCSW

## 2019-06-04 NOTE — TOC Initial Note (Addendum)
Transition of Care [facility]) - Initial/Assessment Note    Patient Details  Name: Miguel Tucker MRN: [id number] Date of Birth: 1930/07/20  [facility] CM/SW Contact:    Ihor Gully, LCSW Phone Number: 06/04/2019, 11:35 AM  Clinical Narrative:                 Patient from [facility]. Admitted for subdural hematoma. Family elects for patient to return to [facility], followed by [facility] hospice services. Patient [facility] to [facility]  Expected Discharge Plan: Finger Barriers to Discharge: Continued Medical Work up   Patient Goals and CMS Choice Patient states their goals for this hospitalization and ongoing recovery are:: Family wants patient to return to [facility] and be followed by [facility] hospice   Choice offered to / list presented to : Adult Children  Expected Discharge Plan and Services Expected Discharge Plan: [place]                                              Prior Living Arrangements/Services   Lives with:: Facility Resident Patient language and need for interpreter reviewed:: Yes Do you feel safe going back to the place where you live?: Yes      Need for Family Participation in Patient Care: Yes (Comment) Care giver support system in place?: No (comment)   Criminal Activity/Legal Involvement Pertinent to Current Situation/Hospitalization: No - Comment as needed  Activities of Daily Living Home Assistive Devices/Equipment: None ADL Screening (condition at time of admission) Patient's cognitive ability adequate to safely complete daily activities?: No Is the patient deaf or have difficulty hearing?: No Does the patient have difficulty seeing, even when wearing glasses/contacts?: No Does the patient have difficulty concentrating, remembering, or making decisions?: Yes Patient able to express need for assistance with ADLs?: No Does the patient have difficulty dressing or bathing?: Yes Independently performs ADLs?:  No Communication: Independent Dressing (OT): Needs assistance Is this a change from baseline?: Pre-admission baseline Grooming: Needs assistance Is this a change from baseline?: Pre-admission baseline Feeding: Independent Bathing: Needs assistance Is this a change from baseline?: Pre-admission baseline Toileting: Needs assistance Is this a change from baseline?: Pre-admission baseline In/Out Bed: Needs assistance Is this a change from baseline?: Pre-admission baseline Walks in Home: Dependent Is this a change from baseline?: Pre-admission baseline Does the patient have difficulty walking or climbing stairs?: Yes Weakness of Legs: None Weakness of Arms/Hands: None  Permission Sought/Granted Permission sought to share information with : Family Supports    Share Information with NAME: Shela Commons, [place] granted to share info w Relationship: son  Permission granted to share info w Contact Information: [phone]  Emotional Assessment     Affect (typically observed): Unable to Assess   Alcohol / Substance Use: Not Applicable Psych Involvement: No (comment)  Admission diagnosis:  Hyperkalemia [E87.5] Subdural hematoma ([place]) [S06.5X9A] SDH (subdural hematoma) ([place]) [S06.5X9A] ESRD (end stage renal disease) ([place]) [N18.6] Patient Active Problem List   Diagnosis Date Noted  . SDH (subdural hematoma) ([place]) 06/02/2019  . Hyperkalemia   . Subdural hematoma ([place])   . Gait instability 01/12/2018  . Acute ischemic stroke ([place]) 12/13/2017  . Weakness of left upper extremity 12/12/2017  . Hypokalemia 12/12/2017  . ESRD (end stage renal disease) ([place]) 12/12/2017  . HLD (hyperlipidemia) 12/12/2017  . Gout 12/12/2017  .  BPH (benign prostatic hyperplasia) 12/12/2017  . Asthma 12/12/2017  . GERD (gastroesophageal reflux disease) 12/12/2017  . Symptomatic anemia 11/25/2017  . Generalized weakness 09/06/2017  . HTN (hypertension) 09/06/2017  . Anemia, chronic  disease 09/06/2017  . Dementia ([place]) 09/06/2017  . Onychomycosis 04/11/2017  . Hammer toe 04/11/2017  . Diabetes mellitus ([place]) 04/11/2017  . Osteoarthritis of knee 02/11/2017  . Difficulty in walking(719.7) 12/09/2011  . Radicular pain of left lower extremity 12/09/2011  . Wound healing, delayed 11/06/2010   PCP:  [name], MD Pharmacy:   [facility] [address], [place] [address] [place] [address] Phone: [phone] Fax: [place], [place] - [address] [place] [address] Phone: [phone] Fax: [phone]     Social Determinants of Health (SDOH) Interventions    Readmission Risk Interventions No flowsheet data found.

## 2019-06-04 NOTE — Progress Notes (Addendum)
PROGRESS NOTE   Miguel Tucker  EQA:[id number] DOB: 04/05/1930 DOA: 06/01/2019 PCP: [name], MD   Chief Complaint  Patient presents with  . Altered Mental Status    Brief Admission History:   86 y.o. male, with medical history of ESRD on hemodialysis MWF, diabetes mellitus type 2, dementia was brought to hospital for increasing confusion and agitation.  Patient lives at local nursing facility generally alert and oriented to person only at baseline however has had increasing agitation and not being cooperative for hemodialysis.  Last dialysis was on Monday.  He missed hemodialysis on Wednesday and Friday.  Patient fell at facility with bruising at right periorbital region.  CT head was obtained which showed small left frontal subdural hematoma.  Neurosurgery Dr. Roseanne Reno was consulted and he reviewed the films.  Did not recommend surgical intervention or transfer to [facility].  He recommended observation and repeat CT head in a.m.  Assessment & Plan:   Active Problems:   SDH (subdural hematoma) (HCC)   Hyperkalemia   Subdural hematoma ([place])   Hospice care  1. Subdural hematoma - repeat CT shows no change, appreciate neurosurgery assistance, no surgical intervention required and no repeat CT recommended.  He is full comfort care now.   2. Seizure - likely secondary to hematoma - Pt has been seen by teleneurology and started on palliative dapakote for comfort.  3. AMS - likely secondary to seizure and metabolic derangement. Not much improvement. Full Comfort care.  4. ESRD on HD - had difficult HD treatment yesterday and had seizure near end of treatment.   5. HTN - stable  6. Goals of care : I had family meeting with sons daughter, daughter in law and we discussed patient's wishes and discussed his prognosis and his refusal of care and they have come to an agreement that we stop dialysis treatments now and focus on full comfort care.  They would prefer him to return to [facility] with hospice benefits which is being arranged.    DVT prophylaxis:  Code Status:  Family Communication: family meeting 5/31  Disposition:   Status is:  INPATIENT   The patient will require care spanning > 2 midnights and should be moved to inpatient because: He is hospice full comfort care.   Dispo: The patient is from: SNF              Anticipated d/c is to: SNF with hospice               Anticipated d/c date is: 2 days              Patient currently is medically stable to d/c to SNF with hospice when they can take him   Consultants:   Nephrology   Procedures:   hemodialysis  Antimicrobials:     Subjective: Pt denies having pain, he is tossing around in the bed at times.     Objective: Vitals:   06/03/19 0125 06/03/19 0635 06/03/19 1419 06/03/19 2010  BP: (!) 156/72 (!) 161/82 (!) 156/72   Pulse: 100 (!) 57 (!) 105   Resp: 18 16    Temp: 99 F (37.2 C) 98.3 F (36.8 C) 98.5 F (36.9 C)   TempSrc:  Oral Oral   SpO2: 99% 100%  100%  Weight:      Height:       No intake or output data in the 24 hours ending 06/04/19 1145 Filed Weights   06/01/19 1619 06/02/19  1520  Weight: 72 kg 72.5 kg    Examination:  General exam: elderly male, agitated, chronically ill appearing, he is tossing around the bed, he is in no distress today.   Respiratory system: shallow breathing.  Cardiovascular system: normal S1 & S2 heard. Gastrointestinal system: Abdomen is nondistended, soft and nontender. No organomegaly or masses felt. Normal bowel sounds heard. Central nervous system: moving all extremities.  Extremities: no gross lesions.  Skin: No rashes, lesions or ulcers Psychiatry: agitated.   Data Reviewed: I have personally reviewed following labs and imaging studies  CBC: Recent Labs  Lab 05/30/19 1802 06/01/19 1814 06/02/19 0715 06/03/19 0735  WBC  --  8.0 6.3 7.7  NEUTROABS  --  5.4  --  5.2  HGB 9.9* 9.7* 9.7* 10.5*  HCT 29.0* 30.8* 31.6* 33.2*  MCV  --   99.7 98.4 97.4  PLT  --  186 189 025    Basic Metabolic Panel: Recent Labs  Lab 05/30/19 1802 06/01/19 1814 06/02/19 0715 06/03/19 0735  NA 140 144 143 140  K 5.2* 5.2* 5.5* 5.1  CL 112* 107 109 102  CO2  --  22 17* 23  GLUCOSE 120* 102* 82 94  BUN 113* 136* 146* 86*  CREATININE 17.80* 16.70* 17.06* 12.07*  CALCIUM  --  8.7* 9.5 9.3  MG  --   --   --  2.1    GFR: Estimated Creatinine Clearance: 3.8 mL/min (A) (by C-G formula based on SCr of 12.07 mg/dL (H)).  Liver Function Tests: Recent Labs  Lab 06/01/19 1814 06/02/19 0715 06/03/19 0735  AST 27 26 43*  ALT 37 37 40  ALKPHOS 63 55 58  BILITOT 0.5 0.6 0.7  PROT 6.6 6.5 7.0  ALBUMIN 3.4* 3.4* 3.6    CBG: Recent Labs  Lab 06/02/19 1819  GLUCAP 122*    Recent Results (from the past 240 hour(s))  SARS Coronavirus 2 by RT PCR (hospital order, performed in [facility] hospital lab) Nasopharyngeal Nasopharyngeal Swab     Status: None   Collection Time: 06/01/19  9:05 PM   Specimen: Nasopharyngeal Swab  Result Value Ref Range Status   SARS Coronavirus 2 NEGATIVE NEGATIVE Final    Comment: (NOTE) SARS-CoV-2 target nucleic acids are NOT DETECTED. The SARS-CoV-2 RNA is generally detectable in upper and lower respiratory specimens during the acute phase of infection. The lowest concentration of SARS-CoV-2 viral copies this assay can detect is 250 copies / mL. A negative result does not preclude SARS-CoV-2 infection and should not be used as the sole basis for treatment or other patient management decisions.  A negative result may occur with improper specimen collection / handling, submission of specimen other than nasopharyngeal swab, presence of viral mutation(s) within the areas targeted by this assay, and inadequate number of viral copies (<250 copies / mL). A negative result must be combined with clinical observations, patient history, and epidemiological information. Fact Sheet for Patients:    StrictlyIdeas.no Fact Sheet for Healthcare Providers: BankingDealers.co.za This test is not yet approved or cleared  by the [place] FDA and has been authorized for detection and/or diagnosis of SARS-CoV-2 by FDA under an Emergency Use Authorization (EUA).  This EUA will remain in effect (meaning this test can be used) for the duration of the COVID-19 declaration under Section 564(b)(1) of the Act, 21 U.S.C. section 360bbb-3(b)(1), unless the authorization is terminated or revoked sooner. Performed at [facility], [address], [place], [place] [address]   Urine Culture  Status: Abnormal   Collection Time: 06/01/19  9:05 PM   Specimen: Urine, Catheterized  Result Value Ref Range Status   Specimen Description   Final    URINE, CATHETERIZED Performed at [facility], [address] [place], [place] [address]    Special Requests   Final    NONE Performed at [facility], [address], [place], [place] [address]    Culture (A)  Final    <10,000 COLONIES/mL INSIGNIFICANT GROWTH Performed at [facility] [address] [place], [place] [address]    Report Status 06/03/2019 FINAL  Final  MRSA PCR Screening     Status: None   Collection Time: 06/02/19  3:18 AM   Specimen: Nasopharyngeal  Result Value Ref Range Status   MRSA by PCR NEGATIVE NEGATIVE Final    Comment:        The GeneXpert MRSA Assay (FDA approved for NASAL specimens only), is one component of a comprehensive MRSA colonization surveillance program. It is not intended to diagnose MRSA infection nor to guide or monitor treatment for MRSA infections. Performed at [facility], [address] [place], [place] [address]      Radiology Studies: No results found.   Scheduled Meds: . atorvastatin  40 mg Oral QHS  . Chlorhexidine Gluconate Cloth  6 each Topical Q0600  . Chlorhexidine Gluconate Cloth  6 each Topical Q0600  . divalproex  250 mg Oral  Q12H  . finasteride  5 mg Oral Daily  . OLANZapine  5 mg Oral q AM  . sertraline  25 mg Oral QHS   Continuous Infusions: . sodium chloride    . sodium chloride       LOS: 0 days   Time spent: 18 mins   Hattye Siegfried Wynetta Emery, MD How to contact the [facility] Attending or Consulting provider [place] or covering provider during after hours [place], for this patient?  1. Check the care team in [facility] and look for a) attending/consulting TRH provider listed and b) the [facility] team listed 2. Log into [email or web address] and use [place]'s universal password to access. If you do not have the password, please contact the hospital operator. 3. Locate the [facility] provider you are looking for under [facility] and page to a number that you can be directly reached. 4. If you still have difficulty reaching the provider, please page the [facility] (Director on Call) for the Hospitalists listed on amion for assistance.  06/04/2019, 11:45 AM

## 2019-06-04 NOTE — Progress Notes (Signed)
Reviewed chart noting discussion of hospitalist with family yesterday regarding GOC and transitioning to hospice, discontinuation of dialysis.  This is appropriate.  I will not continue to follow the patient but please don't hesitate to reach out if there's anything nephrology can help with for Miguel Tucker.

## 2019-06-05 DIAGNOSIS — Z515 Encounter for palliative care: Secondary | ICD-10-CM

## 2019-06-05 MED ORDER — LORAZEPAM 2 MG/ML PO CONC
1.0000 mg | ORAL | 0 refills | Status: AC | PRN
Start: 2019-06-05 — End: ?

## 2019-06-05 MED ORDER — DIVALPROEX SODIUM 250 MG PO DR TAB: 250.0000 mg | DELAYED_RELEASE_TABLET | Freq: Two times a day (BID) | ORAL | Status: AC

## 2019-06-05 MED ORDER — LORATADINE 10 MG PO TABS: 10.0000 mg | ORAL_TABLET | Freq: Every day | ORAL | Status: AC | PRN

## 2019-06-05 MED ORDER — HYDROMORPHONE HCL 2 MG PO TABS: 1.0000 mg | ORAL_TABLET | ORAL | 0 refills | Status: AC | PRN

## 2019-06-05 MED ORDER — LORAZEPAM 2 MG/ML IJ SOLN
1.0000 mg | Freq: Once | INTRAMUSCULAR | Status: AC
  Administered 2019-06-05: 1 mg via INTRAVENOUS
  Filled 2019-06-05: qty 1

## 2019-06-05 NOTE — Progress Notes (Signed)
Patient restless, not staying still on stretcher. IV removed, would not take PRN sublingual ativan. Given 5mg  of IV haldol and he was discharged off the unit with transport.

## 2019-06-05 NOTE — Discharge Instructions (Signed)
Hospice Hospice is a service that is designed to provide people who are terminally ill and their families with medical, spiritual, and psychological support. Its aim is to improve your quality of life by keeping you as comfortable as possible in the final stages of life. Who will be my providers when I begin hospice care? Hospice teams often include:  A nurse.  A doctor. The hospice doctor will be available for your care, but you can include your regular doctor or nurse practitioner.  A social worker.  A counselor.  A religious leader (such as a chaplain).  A dietitian.  Therapists.  Trained volunteers who can help with care. What services does hospice provide? Hospice services can vary depending on the center or organization. Generally, they include:  Ways to keep you comfortable, such as: ? Providing care in your home or in a home-like setting. ? Working with your family and friends to help meet your needs. ? Allowing you to enjoy the support of loved ones by receiving much of your basic care from family and friends.  Pain relief and symptom management. The staff will supply all necessary medicines and equipment so that you can stay comfortable and alert enough to enjoy the company of your friends and family.  Visits or care from a nurse and doctor. This may include 24-hour on-call services.  Companionship when you are alone.  Allowing you and your family to rest. Hospice staff may do light housekeeping, prepare meals, and run errands.  Counseling. They will make sure your emotional, spiritual, and social needs are being met, as well as those needs of your family members.  Spiritual care. This will be individualized to meet your needs and your family's needs. It may involve: ? Helping you and your family understand the dying process. ? Helping you say goodbye to your family and friends. ? Performing a specific religious ceremony or ritual.  Massage.  Nutrition  therapy.  Physical and occupational therapy.  Short-term inpatient care, if something cannot be managed in the home.  Art or music therapy.  Bereavement support for grieving family members. When should hospice care begin? Most people who use hospice are believed to have less than 6 months to live.  Your family and health care providers can help you decide when hospice services should begin.  If you live longer than 6 months but your condition does not improve, your doctor may be able to approve you for continued hospice care.  If your condition improves, you may discontinue the program. What should I consider before selecting a program? Most hospice programs are run by nonprofit, independent organizations. Some are affiliated with hospitals, nursing homes, or home health care agencies. Hospice programs can take place in your home or at a hospice center, hospital, or skilled nursing facility. When choosing a hospice program, ask the following questions:  What services are available to me?  What services will be offered to my loved ones?  How involved will my loved ones be?  How involved will my health care provider be?  Who makes up the hospice care team? How are they trained or screened?  How will my pain and symptoms be managed?  If my circumstances change, can the services be provided in a different setting, such as my home or in the hospital?  Is the program reviewed and licensed by the state or certified in some other way?  What does it cost? Is it covered by insurance?  If I choose a hospice   center or nursing home, where is the hospice center located? Is it convenient for family and friends?  If I choose a hospice center or nursing home, can my family and friends visit any time?  Will you provide emotional and spiritual support?  Who can my family call with questions? Where can I learn more about hospice? You can learn about existing hospice programs in your area  from your health care providers. You can also read more about hospice online. The websites of the following organizations have helpful information:  National Hospice and Palliative Care Organization (NHPCO): www.nhpco.org  National Association for Home Care & Hospice (NAHC): www.nahc.org  H[employer] of [employer] (HFA): www.hospicefoundation.org  American Cancer Society (ACS): www.cancer.org  [facility]: www.hospicenet.org  [employer] (VNAA): www.vnaa.org You may also find more information by contacting the following agencies:  A local agency on aging.  Your local [employer] Way chapter.  Your state's department of health or social services. Summary  Hospice is a service that is designed to provide people who are terminally ill and their families with medical, spiritual, and psychological support.  Hospice aims to improve your quality of life by keeping you as comfortable as possible in the final stages of life.  Hospice teams often include a doctor, nurse, social worker, counselor, religious leader,dietitian, therapists, and volunteers.  Hospice care generally includes medicine for symptom management, visits from doctors and nurses, physical and occupational therapy, nutrition counseling, spiritual and emotional counseling, caregiver support, and bereavement support for grieving family members.  Hospice programs can take place in your home or at a hospice center, hospital, or skilled nursing facility. This information is not intended to replace advice given to you by your health care provider. Make sure you discuss any questions you have with your health care provider. Document Revised: 09/13/2018 Document Reviewed: 01/13/2016 Elsevier Patient Education  2020 [employer]   End-of-Life Care End-of-life care is the physical, emotional, mental, and spiritual care a person receives during the last days, weeks, or months of life. Care at the end of a  person's life requires a team of professionals, which may include:  Health care providers.  A social worker.  A spiritual adviser. The goal of end-of-life care is to give the patient the highest quality of life possible at the end of life. What are the different types of end-of-life care? There are different options for receiving care at the end of your life. Palliative care This type of care can be delivered at the same time as other treatments. The goal is to manage your symptoms and improve your quality of life, which may include:  Control of pain and other symptoms.  Family support.  Spiritual support.  Emotional and social support.  Comfort. You may need palliative care for months or years to manage a long-term (chronic) disease or condition. Hospice care This is a kind of end-of-life care that may be recommended by your health care providers. Hospice care is usually offered when a person is expected to live for six months or less. Hospice care is designed to provide people who are terminally ill and their families with medical, spiritual, and psychological support. The aim is to improve your quality of life by keeping you as comfortable as possible in the final stages of life. Comfort care This type of care is designed to help meet your basic needs and maintain your overall comfort at the end of your life. This includes:  Caring for your skin.    Making sure that you are breathing well.  Ensuring that you are eating well.  Making sure that you get enough rest.  Making sure that you are at a comfortable body temperature. A plan for comfort care can also address the mental, emotional, and spiritual issues that may come up at the end of your life. Where does end-of-life care take place? End-of-life care can take place wherever you are living, as long as you get the care you need. End-of-life care can happen:  At your home.  In a nursing home.  In a hospital. You and  your loved ones may be able to decide where end-of-life care takes place. This decision depends on:  Your wishes.  Your comfort.  The medical equipment you need. How do I know when it is time for end-of-life care? Your health care provider may tell you that treatments can no longer control your illness. You may also decide that you do not want to undergo the treatments that are available. Talk to your health care provider and your loved ones about your end-of-life care options. If possible, discuss the following topics with your health care provider before you need end-of-life care:  How much medical treatment you want during end-of-life care.  Where you would like to live during end-of-life care.  What kinds of treatments you would like to keep you comfortable.  Which treatments you would refuse.  Your faith or spiritual needs at the end of your life.  Who will handle practical details, such as your will, finances, and funeral planning. You can create legal documents (advance directives) to let your loved ones know your wishes for end-of-life care. Talk to your health care provider or a lawyer about making a living will that explains your medical wishes. You can also have a medical power of attorney. This designates a person to make health decisions for you if you cannot make them yourself. Summary  End-of-life care is the physical, emotional, mental, and spiritual care a person receives during the last days, weeks, or months of life.  The goal of end-of-life care is to give the patient the highest quality of life possible at the end of life.  There are a number of different options for receiving this care, including palliative care, hospice care, or comfort care.  Talk to your health care provider and your loved ones about your preferences for end-of-life care. This includes the place to receive care, the kind of care you want to receive, the care you want to decline, your spiritual  needs, and your finances. This information is not intended to replace advice given to you by your health care provider. Make sure you discuss any questions you have with your health care provider. Document Revised: 01/04/2017 Document Reviewed: 01/04/2017 Elsevier Patient Education  2020 [employer]  

## 2019-06-05 NOTE — Discharge Summary (Signed)
Physician Discharge Summary  Miguel Tucker CNO:[id number] DOB: 10-03-1930 DOA: 06/01/2019  PCP: [name], MD  Admit date: 06/01/2019 Discharge date: 06/05/2019  [facility]  Disposition:  Return to [facility] with Hospice  Recommendations for Outpatient Management  SYMPTOM MANAGEMENT PER HOSPICE PROTOCOL   PLEASE CALL HOSPICE FIRST FOR ANY PROBLEMS OR CONCERNS.  NO FURTHER HEMODIALYSIS   Discharge Condition: HOSPICE   CODE STATUS: DNR    Brief Hospitalization Summary: Please see all hospital notes, images, labs for full details of the hospitalization. ADMISSION HPI:  Miguel Tucker  is a 86 y.o. male, with medical history of ESRD on hemodialysis MWF, diabetes mellitus type 2, dementia was brought to hospital for increasing confusion and agitation.  Patient lives at local nursing facility generally alert and oriented to person only at baseline however has had increasing agitation and not being cooperative for hemodialysis.  Last dialysis was on Monday.  He missed hemodialysis on Wednesday and Friday.  Patient fell at facility with bruising at right periorbital region.  CT head was obtained which showed small left frontal subdural hematoma.  Neurosurgery Dr. Roseanne Reno was consulted and he reviewed the films.  Did not recommend surgical intervention or transfer to [facility].  He recommended observation and repeat CT head in a.m.  Patient at this time is agitated, unable to provide any significant history.  Brief Admission History:  86 y.o.male,with medical history of ESRD on hemodialysis MWF, diabetes mellitus type 2, dementia was brought to hospital for increasing confusion and agitation. Patient lives at local nursing facility generally alert and oriented to person only at baseline however has had increasing agitation and not being cooperative for hemodialysis. Last dialysis was on Monday. He missed hemodialysis on Wednesday and Friday. Patient fell at  facility with bruising at right periorbital region.  CT head was obtained which showed small left frontal subdural hematoma. Neurosurgery Dr. Roseanne Reno was consulted and he reviewed the films. Did not recommend surgical intervention or transfer to [facility]. He recommended observation and repeat CT head in a.m.  Assessment & Plan:   Active Problems:   SDH (subdural hematoma)    Hyperkalemia   Subdural hematoma   Hospice care  1. Stable small subdural hematoma - repeat CT showed no significant change, appreciate neurosurgery assistance, no surgical intervention required and no repeat CT recommended.  He is full comfort care now.   2. Seizure - likely secondary to hematoma - Pt has been seen by teleneurology and started on palliative dapakote for comfort.  3. AMS - likely secondary to seizure and metabolic derangement. Full Comfort care.  4. ESRD on HD - After goals of care discussions No further hemodialysis was decided by family.    5. HTN - stable, clonidine as needed only.   6. Goals of care : I had family meeting with sons daughter, daughter in law and we discussed patient's wishes and discussed his prognosis and his refusal of care and they have come to an agreement that we stop dialysis treatments now and focus on full comfort care.  They would prefer him to return to [facility] with hospice benefits.    DVT prophylaxis:  Code Status: DNR  Family Communication: family meeting 5/31  Disposition: Return to [facility] with hospice  Discharge Diagnoses:  Active Problems:   SDH (subdural hematoma) (HCC)   Hyperkalemia   Subdural hematoma [facility])   Hospice care   Discharge Instructions:  Allergies as of 06/05/2019  Reactions   Penicillins Itching, Other (See Comments)   On arms only-not listed on current MAR      Medication List    STOP taking these medications   allopurinol 100 MG tablet Commonly known as: ZYLOPRIM   aspirin EC 81 MG tablet   atorvastatin 40  MG tablet Commonly known as: LIPITOR   b complex-vitamin c-folic acid 0.8 MG Tabs tablet   LORazepam 0.5 MG tablet Commonly known as: ATIVAN Replaced by: LORazepam 2 MG/ML concentrated solution   LORazepam 1 MG tablet Commonly known as: ATIVAN   NONFORMULARY OR COMPOUNDED ITEM   PRO-STAT PO   sevelamer 800 MG tablet Commonly known as: RENAGEL     TAKE these medications   acetaminophen 500 MG tablet Commonly known as: TYLENOL Take 500 mg by mouth in the morning, at noon, and at bedtime.   albuterol 108 (90 Base) MCG/ACT inhaler Commonly known as: VENTOLIN HFA Inhale 1-2 puffs into the lungs every 6 (six) hours as needed for wheezing or shortness of breath.   cloNIDine 0.1 MG tablet Commonly known as: CATAPRES Take 0.1 mg by mouth every 8 (eight) hours as needed (for systolic >818 or diastolic > 90).   colchicine 0.6 MG tablet TAKE 1 TABLET BY MOUTH ONCE DAILY. What changed: how much to take   divalproex 250 MG DR tablet Commonly known as: DEPAKOTE Take 1 tablet (250 mg total) by mouth every 12 (twelve) hours.   finasteride 5 MG tablet Commonly known as: PROSCAR Take 5 mg by mouth daily.   HYDROmorphone 2 MG tablet Commonly known as: Dilaudid Take 0.5-1 tablets (1-2 mg total) by mouth every 4 (four) hours as needed for moderate pain or severe pain.   loratadine 10 MG tablet Commonly known as: CLARITIN Take 1 tablet (10 mg total) by mouth daily as needed for allergies. What changed:   when to take this  reasons to take this   LORazepam 2 MG/ML concentrated solution Commonly known as: LORazepam Intensol Take 0.5 mLs (1 mg total) by mouth every 4 (four) hours as needed for anxiety or sleep. Replaces: LORazepam 0.5 MG tablet   OLANZapine 5 MG tablet Commonly known as: ZYPREXA Take 5 mg by mouth in the morning.   sertraline 25 MG tablet Commonly known as: ZOLOFT Take 25 mg by mouth at bedtime.       Allergies  Allergen Reactions  . Penicillins  Itching and Other (See Comments)    On arms only-not listed on current MAR   Allergies as of 06/05/2019      Reactions   Penicillins Itching, Other (See Comments)   On arms only-not listed on current MAR      Medication List    STOP taking these medications   allopurinol 100 MG tablet Commonly known as: ZYLOPRIM   aspirin EC 81 MG tablet   atorvastatin 40 MG tablet Commonly known as: LIPITOR   b complex-vitamin c-folic acid 0.8 MG Tabs tablet   LORazepam 0.5 MG tablet Commonly known as: ATIVAN Replaced by: LORazepam 2 MG/ML concentrated solution   LORazepam 1 MG tablet Commonly known as: ATIVAN   NONFORMULARY OR COMPOUNDED ITEM   PRO-STAT PO   sevelamer 800 MG tablet Commonly known as: RENAGEL     TAKE these medications   acetaminophen 500 MG tablet Commonly known as: TYLENOL Take 500 mg by mouth in the morning, at noon, and at bedtime.   albuterol 108 (90 Base) MCG/ACT inhaler Commonly known as: VENTOLIN HFA Inhale 1-2 puffs  into the lungs every 6 (six) hours as needed for wheezing or shortness of breath.   cloNIDine 0.1 MG tablet Commonly known as: CATAPRES Take 0.1 mg by mouth every 8 (eight) hours as needed (for systolic >675 or diastolic > 90).   colchicine 0.6 MG tablet TAKE 1 TABLET BY MOUTH ONCE DAILY. What changed: how much to take   divalproex 250 MG DR tablet Commonly known as: DEPAKOTE Take 1 tablet (250 mg total) by mouth every 12 (twelve) hours.   finasteride 5 MG tablet Commonly known as: PROSCAR Take 5 mg by mouth daily.   HYDROmorphone 2 MG tablet Commonly known as: Dilaudid Take 0.5-1 tablets (1-2 mg total) by mouth every 4 (four) hours as needed for moderate pain or severe pain.   loratadine 10 MG tablet Commonly known as: CLARITIN Take 1 tablet (10 mg total) by mouth daily as needed for allergies. What changed:   when to take this  reasons to take this   LORazepam 2 MG/ML concentrated solution Commonly known as: LORazepam  Intensol Take 0.5 mLs (1 mg total) by mouth every 4 (four) hours as needed for anxiety or sleep. Replaces: LORazepam 0.5 MG tablet   OLANZapine 5 MG tablet Commonly known as: ZYPREXA Take 5 mg by mouth in the morning.   sertraline 25 MG tablet Commonly known as: ZOLOFT Take 25 mg by mouth at bedtime.       Procedures/Studies: CT HEAD WO CONTRAST  Result Date: 06/02/2019 CLINICAL DATA:  Subdural hematoma, follow-up EXAM: CT HEAD WITHOUT CONTRAST TECHNIQUE: Contiguous axial images were obtained from the base of the skull through the vertex without intravenous contrast. COMPARISON:  06/01/2019 FINDINGS: Brain: Extra-axial spaces are prominent as noted on previous imaging, but there are also superimposed subdural hygromas present. Left cerebral convexity subdural hygroma with superimposed small volume acute blood products again identified. There also stable trace acute blood products within a right subdural hygroma. Hygromas measure up to 14 mm in thickness on the right and 7 mm on the left. This is new/increased since 2019. Stable minor leftward midline shift. There is no new loss of gray-white differentiation. Patchy hypoattenuation in the supratentorial white matter is nonspecific but probably reflects moderate chronic microvascular ischemic changes. Vascular: There is atherosclerotic calcification at the skull base. Skull: Calvarium is unremarkable. Sinuses/Orbits: Minor mucosal thickening.  Orbits are unremarkable. Other: None. IMPRESSION: Bilateral cerebral convexity subdural hygromas, right larger than left, with small volume acute blood products. No new hemorrhage. Stable mild mass effect. Electronically Signed   By: Macy Mis [name]   On: 06/02/2019 10:45   CT Head Wo Contrast  Result Date: 06/01/2019 CLINICAL DATA:  Altered mental status. Head trauma, minor (Age >= 65y) EXAM: CT HEAD WITHOUT CONTRAST TECHNIQUE: Contiguous axial images were obtained from the base of the skull through  the vertex without intravenous contrast. COMPARISON:  Head CT 04/24/2019, brain MRI 12/13/2017 FINDINGS: Brain: Acute left frontal subdural hematoma measures up to 6 mm in thickness. Possible tiny acute right frontal subdural hematoma, series 2, image 23. No significant mass effect. There is no midline shift. Advanced atrophy and prominence of the extra-axial CSF spaces, stable from prior exam. No subarachnoid or intraparenchymal blood. No hydrocephalus. Stable degree of generalized atrophy. Basilar cisterns are patent. Vascular: Atherosclerosis of skullbase vasculature without hyperdense vessel or abnormal calcification. Skull: No fracture or focal lesion. Sinuses/Orbits: Trace mucosal thickening in left maxillary sinus. Paranasal sinuses otherwise clear. The mastoid air cells are clear. Other: None. IMPRESSION: 1. Acute small  left frontal subdural hematoma measures up to 6 mm in thickness. No significant mass effect. No midline shift. 2. Possible tiny acute right frontal subdural hematoma. 3. Advanced atrophy and prominence of the extra-axial CSF spaces, stable from prior exam. These results were called by telephone at the time of interpretation on 06/01/2019 at 8:16 pm to Dr Tomi Bamberger , who verbally acknowledged these results. Electronically Signed   By: Keith Rake [name]   On: 06/01/2019 20:16      Subjective: Pt has no specific complaints.   Discharge Exam: Vitals:   06/04/19 1952 06/04/19 2105  BP:  120/71  Pulse:  93  Resp:  16  Temp:  98.8 F (37.1 C)  SpO2: 100%    Vitals:   06/03/19 2010 06/04/19 1400 06/04/19 1952 06/04/19 2105  BP:  (!) 148/69  120/71  Pulse:  100  93  Resp:  16  16  Temp:  (!) 100.4 F (38 C)  98.8 F (37.1 C)  TempSrc:    Oral  SpO2: 100% 100% 100%   Weight:      Height:       General: Pt is awake, alert, calm, not in acute distress.  Cardiovascular: normal S1/S2 +, no rubs, no gallops Respiratory: CTA bilaterally, no wheezing, no rhonchi Abdominal:  Soft, NT, ND, bowel sounds + Extremities: no edema, no cyanosis   The results of significant diagnostics from this hospitalization (including imaging, microbiology, ancillary and laboratory) are listed below for reference.     Microbiology: Recent Results (from the past 240 hour(s))  SARS Coronavirus 2 by RT PCR (hospital order, performed in [facility] hospital lab) Nasopharyngeal Nasopharyngeal Swab     Status: None   Collection Time: 06/01/19  9:05 PM   Specimen: Nasopharyngeal Swab  Result Value Ref Range Status   SARS Coronavirus 2 NEGATIVE NEGATIVE Final    Comment: (NOTE) SARS-CoV-2 target nucleic acids are NOT DETECTED. The SARS-CoV-2 RNA is generally detectable in upper and lower respiratory specimens during the acute phase of infection. The lowest concentration of SARS-CoV-2 viral copies this assay can detect is 250 copies / mL. A negative result does not preclude SARS-CoV-2 infection and should not be used as the sole basis for treatment or other patient management decisions.  A negative result may occur with improper specimen collection / handling, submission of specimen other than nasopharyngeal swab, presence of viral mutation(s) within the areas targeted by this assay, and inadequate number of viral copies (<250 copies / mL). A negative result must be combined with clinical observations, patient history, and epidemiological information. Fact Sheet for Patients:   StrictlyIdeas.no Fact Sheet for Healthcare Providers: BankingDealers.co.za This test is not yet approved or cleared  by the [place] FDA and has been authorized for detection and/or diagnosis of SARS-CoV-2 by FDA under an Emergency Use Authorization (EUA).  This EUA will remain in effect (meaning this test can be used) for the duration of the COVID-19 declaration under Section 564(b)(1) of the Act, 21 U.S.C. section 360bbb-3(b)(1), unless the authorization  is terminated or revoked sooner. Performed at [facility], [address], [place], [place] [address]   Urine Culture     Status: Abnormal   Collection Time: 06/01/19  9:05 PM   Specimen: Urine, Catheterized  Result Value Ref Range Status   Specimen Description   Final    URINE, CATHETERIZED Performed at [facility], [address], [place], [place] [address]    Special Requests   Final  NONE Performed at [facility], [address] [place], [place] [address]    Culture (A)  Final    <10,000 COLONIES/mL INSIGNIFICANT GROWTH Performed at [place] [address], [place], [place] [address]    Report Status 06/03/2019 FINAL  Final  MRSA PCR Screening     Status: None   Collection Time: 06/02/19  3:18 AM   Specimen: Nasopharyngeal  Result Value Ref Range Status   MRSA by PCR NEGATIVE NEGATIVE Final    Comment:        The GeneXpert MRSA Assay (FDA approved for NASAL specimens only), is one component of a comprehensive MRSA colonization surveillance program. It is not intended to diagnose MRSA infection nor to guide or monitor treatment for MRSA infections. Performed at [facility], [address] [place], [place] [address]      Labs: BNP (last 3 results) No results for input(s): BNP in the last 8760 hours. Basic Metabolic Panel: Recent Labs  Lab 05/30/19 1802 06/01/19 1814 06/02/19 0715 06/03/19 0735  NA 140 144 143 140  K 5.2* 5.2* 5.5* 5.1  CL 112* 107 109 102  CO2  --  22 17* 23  GLUCOSE 120* 102* 82 94  BUN 113* 136* 146* 86*  CREATININE 17.80* 16.70* 17.06* 12.07*  CALCIUM  --  8.7* 9.5 9.3  MG  --   --   --  2.1   Liver Function Tests: Recent Labs  Lab 06/01/19 1814 06/02/19 0715 06/03/19 0735  AST 27 26 43*  ALT 37 37 40  ALKPHOS 63 55 58  BILITOT 0.5 0.6 0.7  PROT 6.6 6.5 7.0  ALBUMIN 3.4* 3.4* 3.6   No results for input(s): LIPASE, AMYLASE in the last 168 hours. No results for input(s): AMMONIA in the last 168  hours. CBC: Recent Labs  Lab 05/30/19 1802 06/01/19 1814 06/02/19 0715 06/03/19 0735  WBC  --  8.0 6.3 7.7  NEUTROABS  --  5.4  --  5.2  HGB 9.9* 9.7* 9.7* 10.5*  HCT 29.0* 30.8* 31.6* 33.2*  MCV  --  99.7 98.4 97.4  PLT  --  186 189 234   Cardiac Enzymes: No results for input(s): CKTOTAL, CKMB, CKMBINDEX, TROPONINI in the last 168 hours. BNP: Invalid input(s): POCBNP CBG: Recent Labs  Lab 06/02/19 1819  GLUCAP 122*   D-Dimer No results for input(s): DDIMER in the last 72 hours. Hgb A1c No results for input(s): HGBA1C in the last 72 hours. Lipid Profile No results for input(s): CHOL, HDL, LDLCALC, TRIG, CHOLHDL, LDLDIRECT in the last 72 hours. Thyroid function studies No results for input(s): TSH, T4TOTAL, T3FREE, THYROIDAB in the last 72 hours.  Invalid input(s): FREET3 Anemia work up No results for input(s): VITAMINB12, FOLATE, FERRITIN, TIBC, IRON, RETICCTPCT in the last 72 hours. Urinalysis    Component Value Date/Time   COLORURINE STRAW (A) 06/01/2019 1834   APPEARANCEUR CLEAR 06/01/2019 1834   LABSPEC 1.011 06/01/2019 1834   PHURINE 7.0 06/01/2019 1834   GLUCOSEU 50 (A) 06/01/2019 1834   HGBUR LARGE (A) 06/01/2019 1834   BILIRUBINUR NEGATIVE 06/01/2019 1834   KETONESUR NEGATIVE 06/01/2019 1834   PROTEINUR 100 (A) 06/01/2019 1834   UROBILINOGEN 0.2 02/08/2008 0204   NITRITE NEGATIVE 06/01/2019 1834   LEUKOCYTESUR NEGATIVE 06/01/2019 1834   Sepsis Labs Invalid input(s): PROCALCITONIN,  WBC,  LACTICIDVEN Microbiology Recent Results (from the past 240 hour(s))  SARS Coronavirus 2 by RT PCR (hospital order, performed in [facility] hospital lab) Nasopharyngeal Nasopharyngeal  Swab     Status: None   Collection Time: 06/01/19  9:05 PM   Specimen: Nasopharyngeal Swab  Result Value Ref Range Status   SARS Coronavirus 2 NEGATIVE NEGATIVE Final    Comment: (NOTE) SARS-CoV-2 target nucleic acids are NOT DETECTED. The SARS-CoV-2 RNA is generally detectable in  upper and lower respiratory specimens during the acute phase of infection. The lowest concentration of SARS-CoV-2 viral copies this assay can detect is 250 copies / mL. A negative result does not preclude SARS-CoV-2 infection and should not be used as the sole basis for treatment or other patient management decisions.  A negative result may occur with improper specimen collection / handling, submission of specimen other than nasopharyngeal swab, presence of viral mutation(s) within the areas targeted by this assay, and inadequate number of viral copies (<250 copies / mL). A negative result must be combined with clinical observations, patient history, and epidemiological information. Fact Sheet for Patients:   StrictlyIdeas.no Fact Sheet for Healthcare Providers: BankingDealers.co.za This test is not yet approved or cleared  by the [place] FDA and has been authorized for detection and/or diagnosis of SARS-CoV-2 by FDA under an Emergency Use Authorization (EUA).  This EUA will remain in effect (meaning this test can be used) for the duration of the COVID-19 declaration under Section 564(b)(1) of the Act, 21 U.S.C. section 360bbb-3(b)(1), unless the authorization is terminated or revoked sooner. Performed at [facility], [address] [place], [place] [address]   Urine Culture     Status: Abnormal   Collection Time: 06/01/19  9:05 PM   Specimen: Urine, Catheterized  Result Value Ref Range Status   Specimen Description   Final    URINE, CATHETERIZED Performed at [facility], [address], [place], [place] [address]    Special Requests   Final    NONE Performed at [facility], [address], [place], [place] [address]    Culture (A)  Final    <10,000 COLONIES/mL INSIGNIFICANT GROWTH Performed at [place] [address] [place], [place] [address]    Report Status 06/03/2019 FINAL  Final  MRSA PCR Screening      Status: None   Collection Time: 06/02/19  3:18 AM   Specimen: Nasopharyngeal  Result Value Ref Range Status   MRSA by PCR NEGATIVE NEGATIVE Final    Comment:        The GeneXpert MRSA Assay (FDA approved for NASAL specimens only), is one component of a comprehensive MRSA colonization surveillance program. It is not intended to diagnose MRSA infection nor to guide or monitor treatment for MRSA infections. Performed at [facility], [address], [place], [place] [address]    Time coordinating discharge:  36 minutes   SIGNED:  Irwin Brakeman, MD  [facility] 06/05/2019, 10:29 AM How to contact the [facility] Attending or Consulting provider [place] or covering provider during after hours [place], for this patient?  1. Check the care team in [facility] and look for a) attending/consulting TRH provider listed and b) the [facility] team listed 2. Log into [email or web address] and use [place]'s universal password to access. If you do not have the password, please contact the hospital operator. 3. Locate the [facility] provider you are looking for under [facility] and page to a number that you can be directly reached. 4. If you still have difficulty reaching the provider, please page the [facility] (Director on Call) for the Hospitalists listed on amion for assistance.

## 2019-06-05 NOTE — Progress Notes (Signed)
Patient given 1mg  of IV ativan as a one-time dose for agitation. Patient is actively trying to get OOB and swinging legs off of side rails.

## 2019-06-05 NOTE — Progress Notes (Signed)
Report called to [facility] at [facility].

## 2019-06-05 NOTE — Progress Notes (Signed)
Attempted to call in report to [facility], no answer. Social worker notified.

## 2019-06-05 NOTE — TOC Transition Note (Addendum)
Transition of Care [facility]) - CM/SW Discharge Note   Patient Details  Name: Miguel Tucker MRN: [id number] Date of Birth: [birth year]  [facility] CM/SW Contact:  Salome Arnt, [place] Phone Number: 06/05/2019, 1:54 PM   Clinical Narrative:   Pt d/c today back to [facility] with [facility]. Pt's son Mcnee), facility, and hospice notified. COVID negative on 5/28. Per Hildred Alamin at [facility], pt does not need another COVID test. Pt is long term resident at facility- no auth needed. Pt will transport via [facility]. D/C summary sent to facility.     Final next level of care: Long Term Nursing Home Barriers to Discharge: Barriers Resolved, No Barriers Identified   Patient Goals and CMS Choice Patient states their goals for this hospitalization and ongoing recovery are:: Family wants patient to return to [facility] and be followed by [facility] hospice   Choice offered to / list presented to : Adult Children  Discharge Placement              [facility] Patient to be transferred to facility by: [facility] Name of family member notified: [place], son Patient and family notified of of transfer: 06/05/19  Discharge Plan and Services                                     Social Determinants of Health (SDOH) Interventions     Readmission Risk Interventions No flowsheet data found.

## 2019-06-08 DIAGNOSIS — I739 Peripheral vascular disease, unspecified: Secondary | ICD-10-CM | POA: Diagnosis not present

## 2019-06-08 DIAGNOSIS — M109 Gout, unspecified: Secondary | ICD-10-CM | POA: Diagnosis not present

## 2019-06-08 DIAGNOSIS — E119 Type 2 diabetes mellitus without complications: Secondary | ICD-10-CM | POA: Diagnosis not present

## 2019-06-08 DIAGNOSIS — N186 End stage renal disease: Secondary | ICD-10-CM | POA: Diagnosis not present

## 2019-07-05 DEATH — deceased

## 2019-08-26 IMAGING — DX DG CHEST 2V
2 series · 2 of 2 positions shown · non-contrast
Comparison: 09/04/2017, 05/27/2011

CLINICAL DATA: Generalized weakness

EXAM:
CHEST - 2 VIEW

[chest lat]
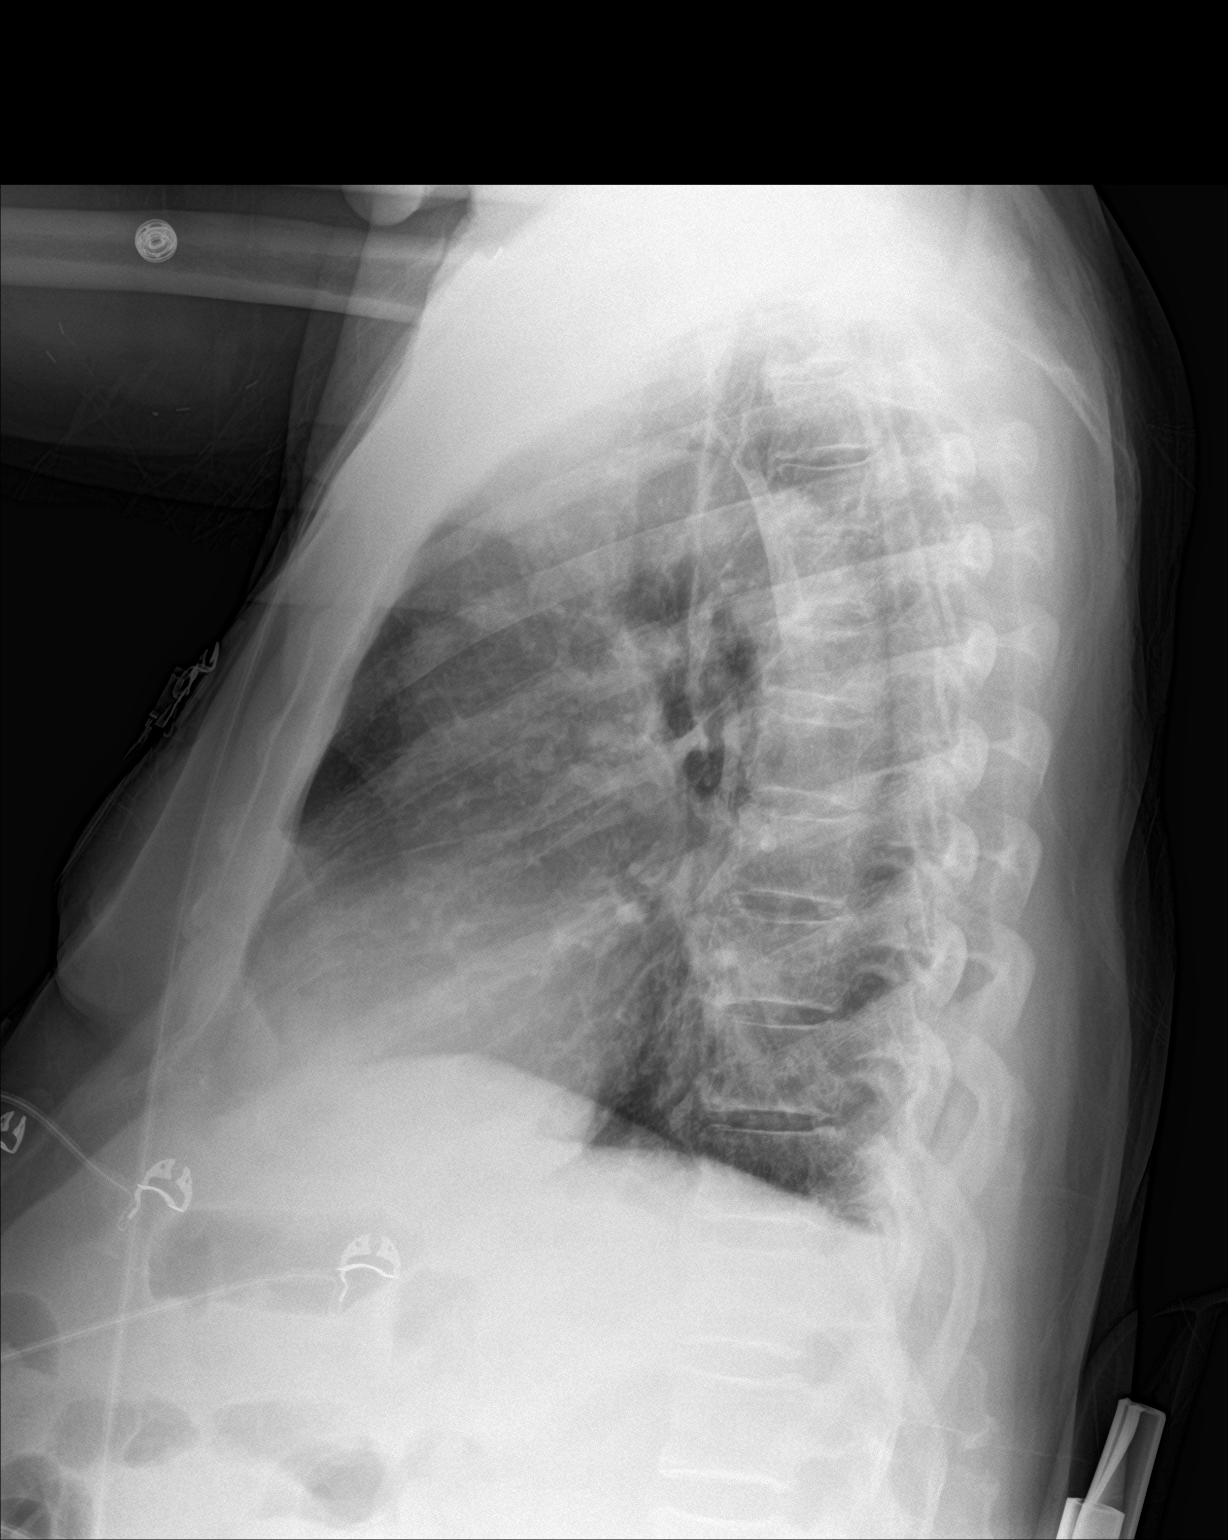

[chest ap]
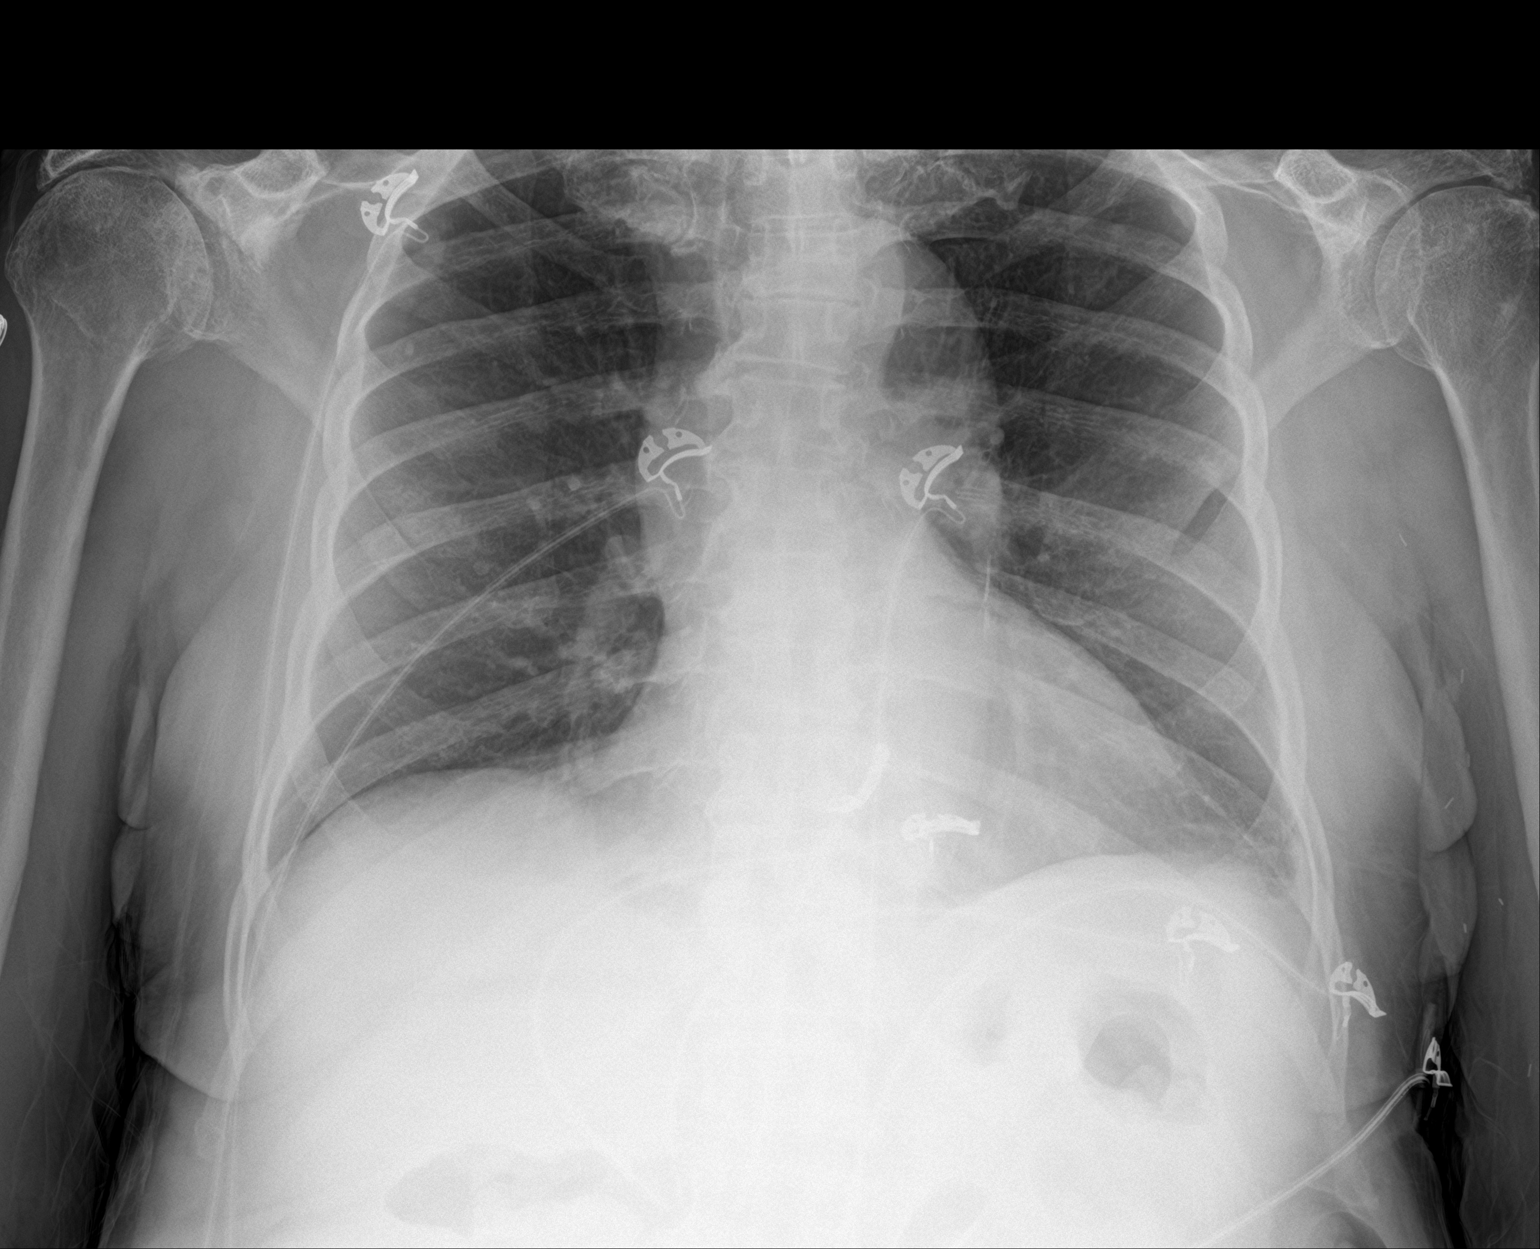

[2 of 2 positions shown; findings below may reference images not displayed]

FINDINGS: No focal airspace disease. Possible tiny left pleural effusion.
Cardiomediastinal silhouette within normal limits. No pneumothorax.
Clips in the left proximal arm.
IMPRESSION: Possible tiny left pleural effusion.

## 2019-12-02 IMAGING — MR MR HEAD W/O CM
8 of 10 series · 40 of 48 positions shown · non-contrast
Comparison: Head CT from yesterday

CLINICAL DATA: Altered mental status for 2 days.  No known injury

EXAM:
MRI HEAD WITHOUT CONTRAST
TECHNIQUE: Multiplanar, multiecho pulse sequences of the brain and surrounding
structures were obtained without intravenous contrast.

[Series 3: DWI · axial · 3.0mm · 0.82mm/px · z∈[-77,+66]mm · 6 of 50 slices shown (1 of 4)]
[im 1/50]
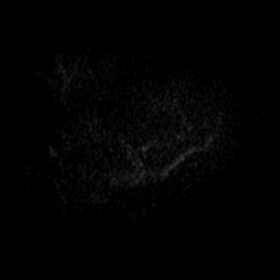
[im 10/50]
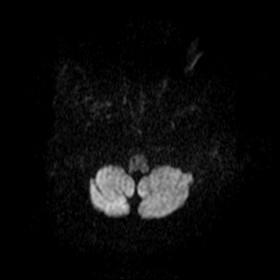
[im 20/50]
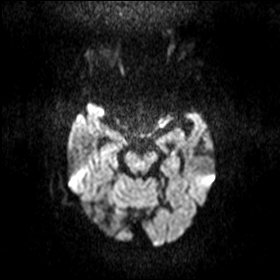
[im 30/50]
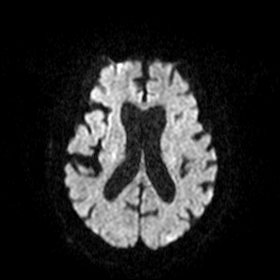
[im 40/50]
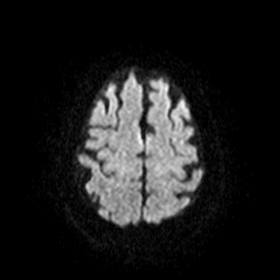
[im 50/50]
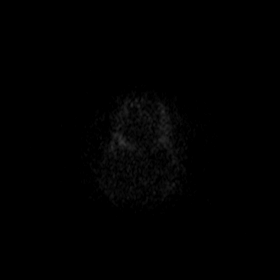

[Series 4: DWI · axial · 3.0mm · 0.82mm/px · z∈[-77,+66]mm · 6 of 50 slices shown (2 of 4)]
[im 1/50]
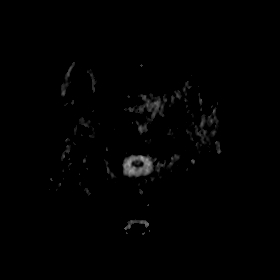
[im 10/50]
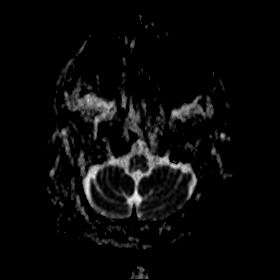
[im 20/50]
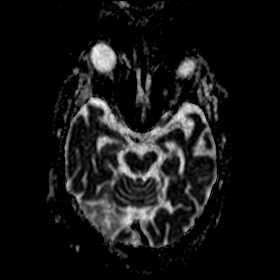
[im 30/50]
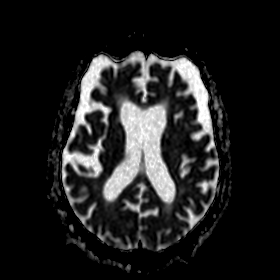
[im 40/50]
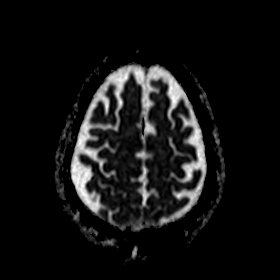
[im 50/50]
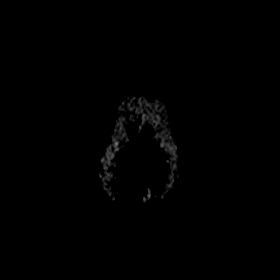

[Series 5: DWI · coronal · 5.0mm · 0.46mm/px · 4 of 34 slices shown (3 of 4)]
[im 1/34]
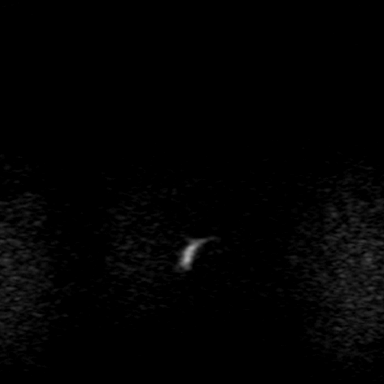
[im 12/34]
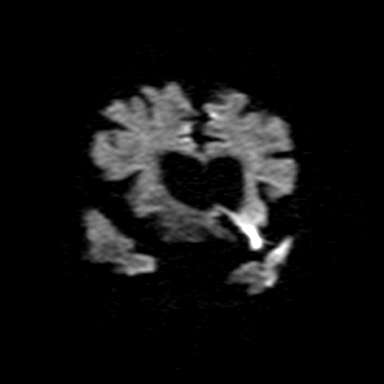
[im 23/34]
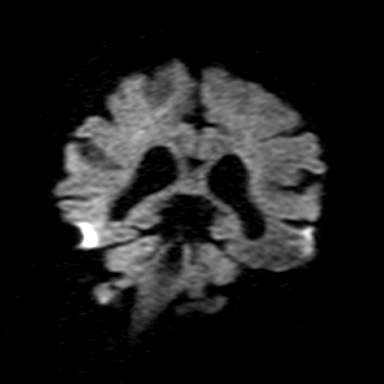
[im 34/34]
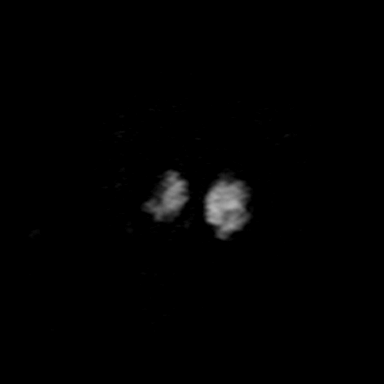

[Series 6: DWI · coronal · 5.0mm · 0.46mm/px · 4 of 33 slices shown (4 of 4)]
[im 1/33]
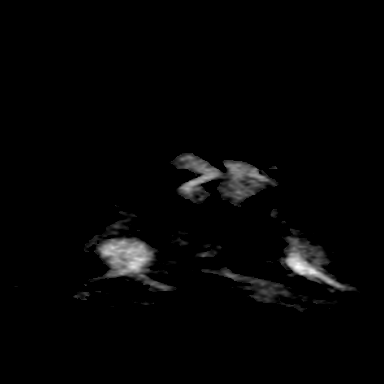
[im 11/33]
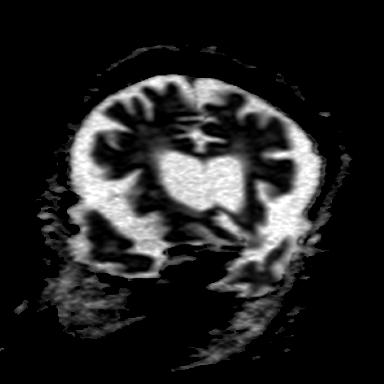
[im 22/33]
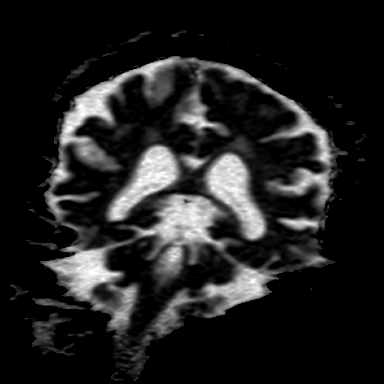
[im 33/33]
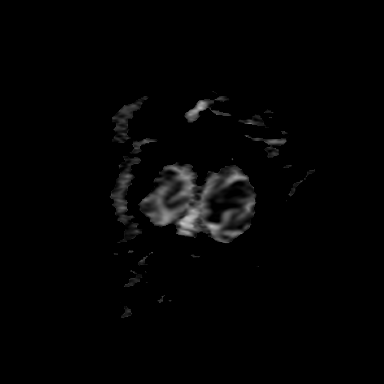

[Series 7: T2 · axial · 5.0mm · 0.75mm/px · z∈[-75,+64]mm · 3 of 23 slices shown (1 of 2)]
[im 1/23]
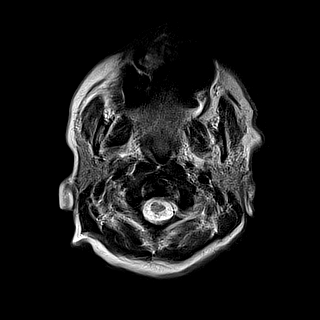
[im 12/23]
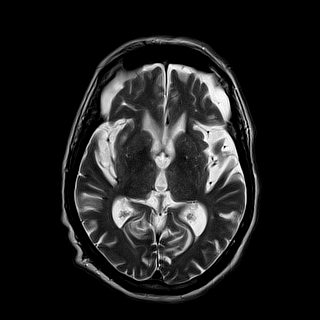
[im 23/23]
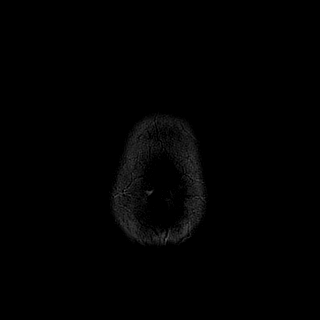

[Series 8: FLAIR · axial · 3.0mm · 0.94mm/px · z∈[-72,+62]mm · 6 of 47 slices shown]
[im 1/47]
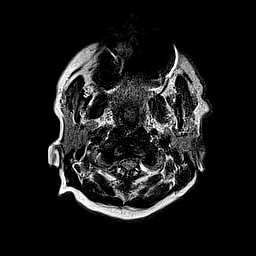
[im 10/47]
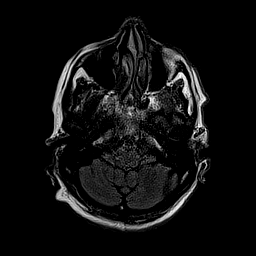
[im 19/47]
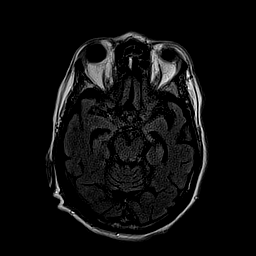
[im 28/47]
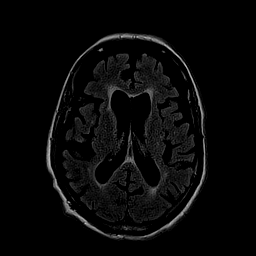
[im 37/47]
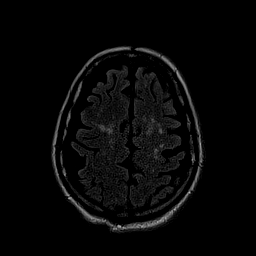
[im 47/47]
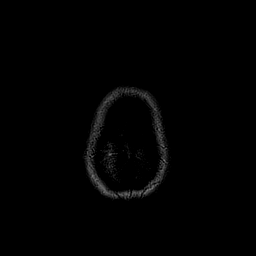

[Series 9: T1 · axial · 2.0mm · 0.47mm/px · z∈[-87,+91]mm · 8 of 93 slices shown]
[im 1/93]
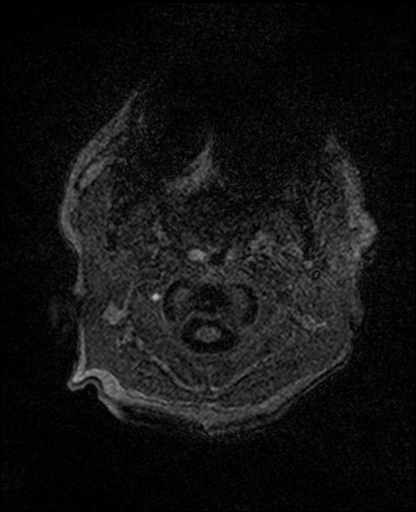
[im 19/93]
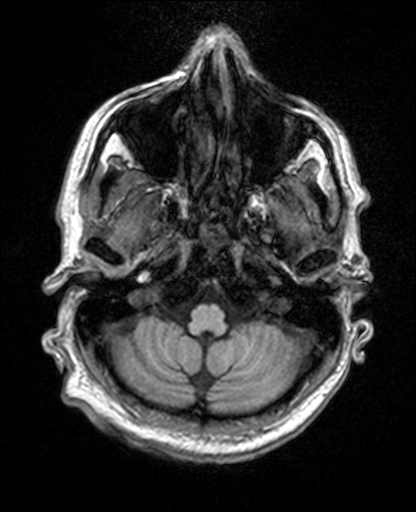
[im 28/93]
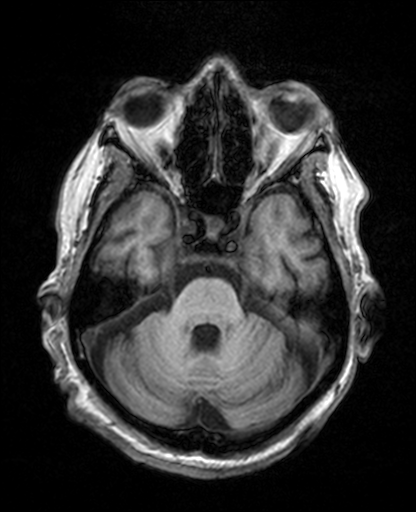
[im 37/93]
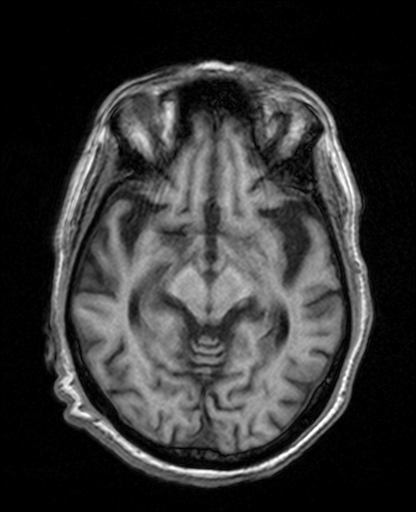
[im 56/93]
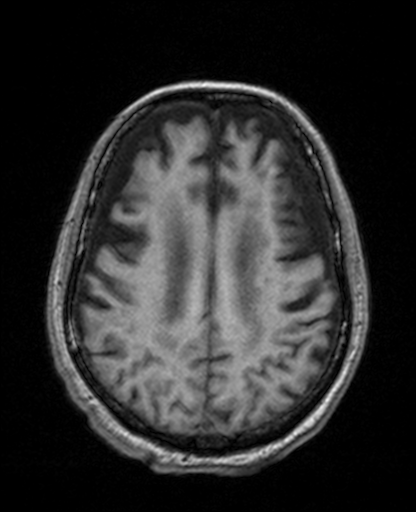
[im 65/93]
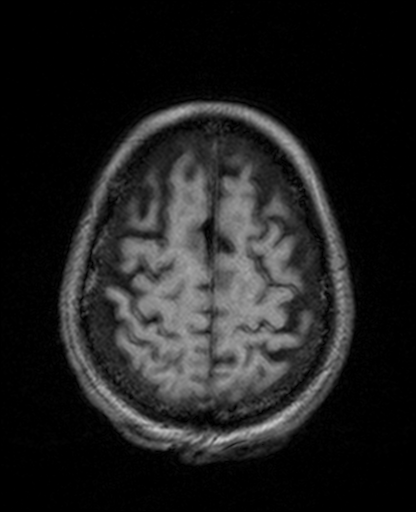
[im 74/93]
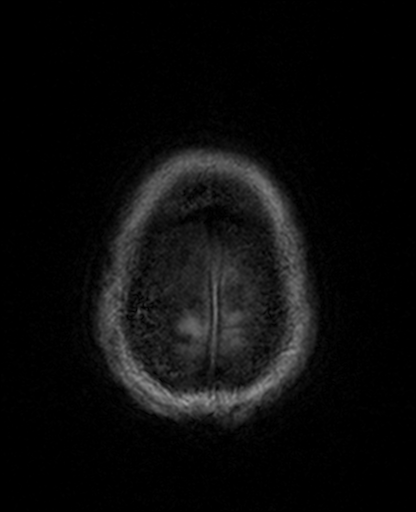
[im 93/93]
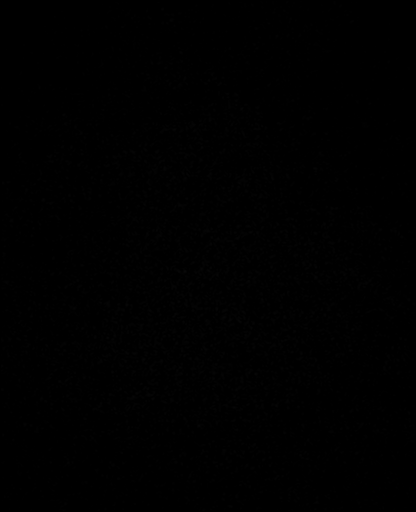

[Series 11: T2 · coronal · 5.0mm · 0.60mm/px · 3 of 28 slices shown (2 of 2)]
[im 1/28]
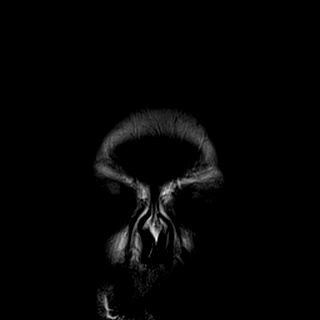
[im 14/28]
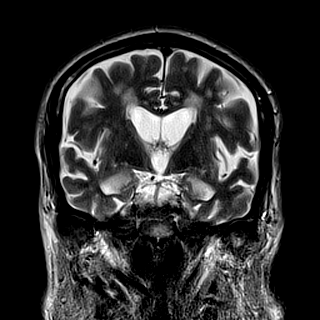
[im 28/28]
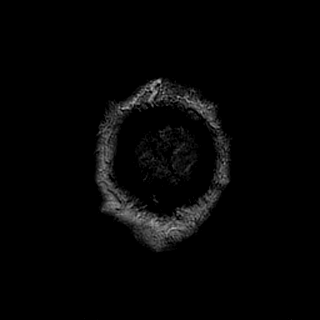

[40 of 48 positions shown; findings below may reference images not displayed]

FINDINGS: Brain: No acute infarction, hemorrhage, hydrocephalus, or mass
lesion. Cerebral volume loss and moderate chronic small vessel
ischemia in the cerebral white matter. Prominent bifrontal
extra-axial CSF suspicious for hygromas, although cortical vessel
displacement is not well demonstrated. This appearance is non
progressed since at least April 2017.

Vascular: Major flow voids are preserved

Skull and upper cervical spine: Diffuse hypointense marrow space
most likely from patient's end-stage renal disease.

Sinuses/Orbits: Negative
IMPRESSION: 1. No acute finding.
2. Atrophy and chronic small vessel ischemia.
